# Patient Record
Sex: Female | Born: 1940
Health system: Southern US, Community
[De-identification: ages and names within clinical notes are randomized; demographics above are authoritative.]

## PROBLEM LIST (undated history)

## (undated) DIAGNOSIS — K635 Polyp of colon: Secondary | ICD-10-CM

## (undated) DIAGNOSIS — G5600 Carpal tunnel syndrome, unspecified upper limb: Secondary | ICD-10-CM

## (undated) DIAGNOSIS — H409 Unspecified glaucoma: Secondary | ICD-10-CM

## (undated) DIAGNOSIS — H269 Unspecified cataract: Secondary | ICD-10-CM

## (undated) DIAGNOSIS — S82892A Other fracture of left lower leg, initial encounter for closed fracture: Secondary | ICD-10-CM

## (undated) DIAGNOSIS — K219 Gastro-esophageal reflux disease without esophagitis: Secondary | ICD-10-CM

## (undated) DIAGNOSIS — E042 Nontoxic multinodular goiter: Secondary | ICD-10-CM

## (undated) DIAGNOSIS — K449 Diaphragmatic hernia without obstruction or gangrene: Secondary | ICD-10-CM

## (undated) DIAGNOSIS — F411 Generalized anxiety disorder: Secondary | ICD-10-CM

## (undated) DIAGNOSIS — Z86010 Personal history of colonic polyps: Secondary | ICD-10-CM

## (undated) DIAGNOSIS — T7840XA Allergy, unspecified, initial encounter: Secondary | ICD-10-CM

## (undated) DIAGNOSIS — J309 Allergic rhinitis, unspecified: Secondary | ICD-10-CM

## (undated) DIAGNOSIS — M810 Age-related osteoporosis without current pathological fracture: Secondary | ICD-10-CM

## (undated) DIAGNOSIS — M1990 Unspecified osteoarthritis, unspecified site: Secondary | ICD-10-CM

## (undated) DIAGNOSIS — M21859 Other specified acquired deformities of unspecified thigh: Secondary | ICD-10-CM

## (undated) DIAGNOSIS — Z87891 Personal history of nicotine dependence: Secondary | ICD-10-CM

## (undated) DIAGNOSIS — D509 Iron deficiency anemia, unspecified: Secondary | ICD-10-CM

## (undated) HISTORY — DX: Unspecified cataract: H26.9

## (undated) HISTORY — DX: Personal history of colonic polyps: Z86.010

## (undated) HISTORY — DX: Other specified acquired deformities of unspecified thigh: M21.859

## (undated) HISTORY — PX: CARPAL TUNNEL RELEASE: SHX101

## (undated) HISTORY — DX: Carpal tunnel syndrome, unspecified upper limb: G56.00

## (undated) HISTORY — DX: Unspecified glaucoma: H40.9

## (undated) HISTORY — DX: Polyp of colon: K63.5

## (undated) HISTORY — DX: Unspecified osteoarthritis, unspecified site: M19.90

## (undated) HISTORY — DX: Diaphragmatic hernia without obstruction or gangrene: K44.9

## (undated) HISTORY — DX: Personal history of nicotine dependence: Z87.891

## (undated) HISTORY — DX: Nontoxic multinodular goiter: E04.2

## (undated) HISTORY — DX: Generalized anxiety disorder: F41.1

## (undated) HISTORY — DX: Iron deficiency anemia, unspecified: D50.9

## (undated) HISTORY — DX: Age-related osteoporosis without current pathological fracture: M81.0

## (undated) HISTORY — DX: Allergic rhinitis, unspecified: J30.9

## (undated) HISTORY — PX: EYE SURGERY: SHX253

## (undated) HISTORY — DX: Allergy, unspecified, initial encounter: T78.40XA

## (undated) HISTORY — DX: Gastro-esophageal reflux disease without esophagitis: K21.9

---
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
## 1997-12-02 ENCOUNTER — Other Ambulatory Visit: Admission: RE | Admit: 1997-12-02 | Discharge: 1997-12-02 | Payer: Self-pay | Admitting: Gynecology

## 1998-11-09 ENCOUNTER — Other Ambulatory Visit: Admission: RE | Admit: 1998-11-09 | Discharge: 1998-11-09 | Payer: Self-pay | Admitting: Obstetrics & Gynecology

## 1998-12-14 ENCOUNTER — Other Ambulatory Visit: Admission: RE | Admit: 1998-12-14 | Discharge: 1998-12-14 | Payer: Self-pay | Admitting: Gynecology

## 1998-12-26 ENCOUNTER — Other Ambulatory Visit: Admission: RE | Admit: 1998-12-26 | Discharge: 1998-12-26 | Payer: Self-pay | Admitting: Gynecology

## 1999-11-20 ENCOUNTER — Other Ambulatory Visit: Admission: RE | Admit: 1999-11-20 | Discharge: 1999-11-20 | Payer: Self-pay | Admitting: Gynecology

## 2000-01-09 ENCOUNTER — Encounter (INDEPENDENT_AMBULATORY_CARE_PROVIDER_SITE_OTHER): Payer: Self-pay | Admitting: *Deleted

## 2000-01-09 ENCOUNTER — Ambulatory Visit (HOSPITAL_COMMUNITY): Admission: RE | Admit: 2000-01-09 | Discharge: 2000-01-09 | Payer: Self-pay | Admitting: Gastroenterology

## 2000-01-25 ENCOUNTER — Encounter: Payer: Self-pay | Admitting: Gastroenterology

## 2000-01-25 ENCOUNTER — Ambulatory Visit (HOSPITAL_COMMUNITY): Admission: RE | Admit: 2000-01-25 | Discharge: 2000-01-25 | Payer: Self-pay | Admitting: Gastroenterology

## 2000-11-28 ENCOUNTER — Other Ambulatory Visit: Admission: RE | Admit: 2000-11-28 | Discharge: 2000-11-28 | Payer: Self-pay | Admitting: Gynecology

## 2001-11-30 ENCOUNTER — Other Ambulatory Visit: Admission: RE | Admit: 2001-11-30 | Discharge: 2001-11-30 | Payer: Self-pay | Admitting: Gynecology

## 2002-04-15 ENCOUNTER — Encounter: Payer: Self-pay | Admitting: Internal Medicine

## 2002-04-15 ENCOUNTER — Encounter: Admission: RE | Admit: 2002-04-15 | Discharge: 2002-04-15 | Payer: Self-pay | Admitting: Internal Medicine

## 2002-12-01 ENCOUNTER — Other Ambulatory Visit: Admission: RE | Admit: 2002-12-01 | Discharge: 2002-12-01 | Payer: Self-pay | Admitting: Gynecology

## 2002-12-08 ENCOUNTER — Encounter: Payer: Self-pay | Admitting: Internal Medicine

## 2002-12-08 ENCOUNTER — Encounter: Admission: RE | Admit: 2002-12-08 | Discharge: 2002-12-08 | Payer: Self-pay | Admitting: Internal Medicine

## 2002-12-16 ENCOUNTER — Encounter: Payer: Self-pay | Admitting: Internal Medicine

## 2002-12-16 ENCOUNTER — Ambulatory Visit (HOSPITAL_COMMUNITY): Admission: RE | Admit: 2002-12-16 | Discharge: 2002-12-16 | Payer: Self-pay | Admitting: Internal Medicine

## 2003-01-11 ENCOUNTER — Encounter: Payer: Self-pay | Admitting: Endocrinology

## 2003-01-11 ENCOUNTER — Encounter (INDEPENDENT_AMBULATORY_CARE_PROVIDER_SITE_OTHER): Payer: Self-pay

## 2003-01-11 ENCOUNTER — Ambulatory Visit (HOSPITAL_COMMUNITY): Admission: RE | Admit: 2003-01-11 | Discharge: 2003-01-11 | Payer: Self-pay | Admitting: Endocrinology

## 2003-08-13 HISTORY — PX: COLONOSCOPY: SHX174

## 2003-12-16 ENCOUNTER — Ambulatory Visit (HOSPITAL_COMMUNITY): Admission: RE | Admit: 2003-12-16 | Discharge: 2003-12-16 | Payer: Self-pay | Admitting: Endocrinology

## 2004-04-30 ENCOUNTER — Encounter: Payer: Self-pay | Admitting: Gastroenterology

## 2004-04-30 LAB — HM COLONOSCOPY

## 2004-08-10 ENCOUNTER — Encounter: Admission: RE | Admit: 2004-08-10 | Discharge: 2004-08-10 | Payer: Self-pay | Admitting: Plastic Surgery

## 2004-08-15 ENCOUNTER — Encounter: Admission: RE | Admit: 2004-08-15 | Discharge: 2004-08-15 | Payer: Self-pay | Admitting: Plastic Surgery

## 2004-08-16 ENCOUNTER — Ambulatory Visit (HOSPITAL_COMMUNITY): Admission: RE | Admit: 2004-08-16 | Discharge: 2004-08-16 | Payer: Self-pay | Admitting: Plastic Surgery

## 2004-12-25 ENCOUNTER — Ambulatory Visit: Payer: Self-pay | Admitting: Endocrinology

## 2005-01-01 ENCOUNTER — Ambulatory Visit (HOSPITAL_COMMUNITY): Admission: RE | Admit: 2005-01-01 | Discharge: 2005-01-01 | Payer: Self-pay | Admitting: Endocrinology

## 2005-08-12 DIAGNOSIS — Z860100 Personal history of colon polyps, unspecified: Secondary | ICD-10-CM

## 2005-08-12 HISTORY — DX: Personal history of colon polyps, unspecified: Z86.0100

## 2005-08-12 HISTORY — DX: Personal history of colonic polyps: Z86.010

## 2005-12-04 ENCOUNTER — Other Ambulatory Visit: Admission: RE | Admit: 2005-12-04 | Discharge: 2005-12-04 | Payer: Self-pay | Admitting: Gynecology

## 2005-12-17 ENCOUNTER — Ambulatory Visit: Payer: Self-pay | Admitting: Gastroenterology

## 2005-12-19 ENCOUNTER — Ambulatory Visit: Payer: Self-pay | Admitting: Gastroenterology

## 2005-12-23 ENCOUNTER — Ambulatory Visit: Payer: Self-pay | Admitting: Gastroenterology

## 2006-03-27 ENCOUNTER — Ambulatory Visit: Payer: Self-pay | Admitting: Gastroenterology

## 2006-04-03 ENCOUNTER — Ambulatory Visit: Payer: Self-pay | Admitting: Endocrinology

## 2006-07-09 ENCOUNTER — Ambulatory Visit: Payer: Self-pay | Admitting: Gastroenterology

## 2007-01-06 ENCOUNTER — Encounter: Payer: Self-pay | Admitting: Internal Medicine

## 2007-05-21 ENCOUNTER — Encounter: Payer: Self-pay | Admitting: *Deleted

## 2007-05-21 DIAGNOSIS — M1990 Unspecified osteoarthritis, unspecified site: Secondary | ICD-10-CM

## 2007-05-21 DIAGNOSIS — K219 Gastro-esophageal reflux disease without esophagitis: Secondary | ICD-10-CM

## 2007-05-21 DIAGNOSIS — E042 Nontoxic multinodular goiter: Secondary | ICD-10-CM

## 2007-05-21 DIAGNOSIS — G5600 Carpal tunnel syndrome, unspecified upper limb: Secondary | ICD-10-CM

## 2007-05-21 DIAGNOSIS — Z860100 Personal history of colon polyps, unspecified: Secondary | ICD-10-CM | POA: Insufficient documentation

## 2007-05-21 DIAGNOSIS — D509 Iron deficiency anemia, unspecified: Secondary | ICD-10-CM | POA: Insufficient documentation

## 2007-06-26 ENCOUNTER — Encounter: Payer: Self-pay | Admitting: Internal Medicine

## 2007-06-26 LAB — CONVERTED CEMR LAB
Hemoglobin: 11.6 g/dL
Platelets: 165 10*3/uL
RBC: 3.61 M/uL

## 2007-09-29 ENCOUNTER — Encounter
Admission: RE | Admit: 2007-09-29 | Discharge: 2007-09-29 | Payer: Self-pay | Admitting: Physical Medicine and Rehabilitation

## 2007-10-21 ENCOUNTER — Encounter: Payer: Self-pay | Admitting: Internal Medicine

## 2007-12-16 ENCOUNTER — Other Ambulatory Visit: Admission: RE | Admit: 2007-12-16 | Discharge: 2007-12-16 | Payer: Self-pay | Admitting: Family Medicine

## 2007-12-16 ENCOUNTER — Encounter: Payer: Self-pay | Admitting: Internal Medicine

## 2007-12-16 LAB — CONVERTED CEMR LAB

## 2008-04-11 ENCOUNTER — Encounter: Payer: Self-pay | Admitting: Internal Medicine

## 2008-04-20 ENCOUNTER — Encounter: Payer: Self-pay | Admitting: Internal Medicine

## 2008-04-20 LAB — CONVERTED CEMR LAB
Lymphocytes, automated: 42.8 %
Platelets: 171 10*3/uL
RBC: 4.22 M/uL
WBC: 5.1 10*3/uL

## 2008-05-09 ENCOUNTER — Encounter: Payer: Self-pay | Admitting: Internal Medicine

## 2008-06-29 ENCOUNTER — Encounter: Payer: Self-pay | Admitting: Internal Medicine

## 2008-06-29 LAB — CONVERTED CEMR LAB
Basophils Relative: 1 %
Hemoglobin: 12.9 g/dL
MCV: 94 fL
RBC: 4.06 M/uL
WBC: 5.5 10*3/uL

## 2008-07-13 DIAGNOSIS — K449 Diaphragmatic hernia without obstruction or gangrene: Secondary | ICD-10-CM | POA: Insufficient documentation

## 2008-07-13 DIAGNOSIS — K222 Esophageal obstruction: Secondary | ICD-10-CM | POA: Insufficient documentation

## 2008-07-14 ENCOUNTER — Ambulatory Visit: Payer: Self-pay | Admitting: Gastroenterology

## 2008-07-14 LAB — CONVERTED CEMR LAB
ALT: 19 units/L (ref 0–35)
AST: 25 units/L (ref 0–37)
Alkaline Phosphatase: 52 units/L (ref 39–117)
Bilirubin, Direct: 0.1 mg/dL (ref 0.0–0.3)
Tissue Transglutaminase Ab, IgA: 0.1 units (ref <7)
Total Bilirubin: 0.9 mg/dL (ref 0.3–1.2)
Total Protein: 7 g/dL (ref 6.0–8.3)

## 2008-08-23 ENCOUNTER — Ambulatory Visit (HOSPITAL_COMMUNITY): Admission: RE | Admit: 2008-08-23 | Discharge: 2008-08-23 | Payer: Self-pay | Admitting: Gastroenterology

## 2008-09-04 ENCOUNTER — Encounter: Admission: RE | Admit: 2008-09-04 | Discharge: 2008-09-04 | Payer: Self-pay | Admitting: Specialist

## 2008-10-10 HISTORY — PX: OTHER SURGICAL HISTORY: SHX169

## 2008-12-27 ENCOUNTER — Encounter: Payer: Self-pay | Admitting: Internal Medicine

## 2008-12-27 LAB — CONVERTED CEMR LAB
HCT: 37 %
Hemoglobin: 12.4 g/dL
Platelets: 187 10*3/uL
RBC: 4.2 M/uL
RDW: 13.2 %

## 2009-06-09 ENCOUNTER — Encounter: Payer: Self-pay | Admitting: Internal Medicine

## 2009-06-09 LAB — CONVERTED CEMR LAB
BUN: 13 mg/dL
Chloride: 105 meq/L
Glucose, Bld: 86 mg/dL
HCT: 37.3 %
Hemoglobin: 12.7 g/dL
Lymphocytes, automated: 44.5 %
Neutrophils Relative %: 42.5 %
Platelets: 174 10*3/uL
Potassium: 4.9 meq/L
RDW: 14.9 %
Sodium: 141 meq/L
Total Protein: 6.5 g/dL

## 2009-10-16 ENCOUNTER — Ambulatory Visit (HOSPITAL_COMMUNITY)
Admission: RE | Admit: 2009-10-16 | Discharge: 2009-10-16 | Payer: Self-pay | Admitting: Physical Medicine and Rehabilitation

## 2009-10-20 ENCOUNTER — Encounter: Payer: Self-pay | Admitting: Internal Medicine

## 2009-10-20 LAB — CONVERTED CEMR LAB: TSH: 1.42 microintl units/mL

## 2009-11-28 ENCOUNTER — Ambulatory Visit: Payer: Self-pay | Admitting: Internal Medicine

## 2009-11-28 DIAGNOSIS — R5383 Other fatigue: Secondary | ICD-10-CM

## 2009-11-28 DIAGNOSIS — Z9189 Other specified personal risk factors, not elsewhere classified: Secondary | ICD-10-CM | POA: Insufficient documentation

## 2009-11-28 DIAGNOSIS — M129 Arthropathy, unspecified: Secondary | ICD-10-CM | POA: Insufficient documentation

## 2009-11-28 DIAGNOSIS — R5381 Other malaise: Secondary | ICD-10-CM | POA: Insufficient documentation

## 2009-11-28 DIAGNOSIS — Z87891 Personal history of nicotine dependence: Secondary | ICD-10-CM

## 2009-11-28 DIAGNOSIS — J309 Allergic rhinitis, unspecified: Secondary | ICD-10-CM | POA: Insufficient documentation

## 2009-11-29 LAB — CONVERTED CEMR LAB
Eosinophils Absolute: 0.1 10*3/uL (ref 0.0–0.7)
Eosinophils Relative: 1.7 % (ref 0.0–5.0)
Lymphocytes Relative: 43.8 % (ref 12.0–46.0)
Lymphs Abs: 3.2 10*3/uL (ref 0.7–4.0)
Monocytes Relative: 5.6 % (ref 3.0–12.0)
Platelets: 231 10*3/uL (ref 150.0–400.0)
RDW: 14.9 % — ABNORMAL HIGH (ref 11.5–14.6)
Vitamin B-12: 610 pg/mL (ref 211–911)
WBC: 7.3 10*3/uL (ref 4.5–10.5)

## 2009-12-07 ENCOUNTER — Encounter: Payer: Self-pay | Admitting: Internal Medicine

## 2009-12-11 ENCOUNTER — Telehealth (INDEPENDENT_AMBULATORY_CARE_PROVIDER_SITE_OTHER): Payer: Self-pay | Admitting: *Deleted

## 2009-12-14 ENCOUNTER — Encounter: Payer: Self-pay | Admitting: Internal Medicine

## 2009-12-14 DIAGNOSIS — H409 Unspecified glaucoma: Secondary | ICD-10-CM | POA: Insufficient documentation

## 2010-01-11 LAB — HM MAMMOGRAPHY

## 2010-02-27 ENCOUNTER — Ambulatory Visit: Payer: Self-pay | Admitting: Gastroenterology

## 2010-02-27 DIAGNOSIS — R160 Hepatomegaly, not elsewhere classified: Secondary | ICD-10-CM | POA: Insufficient documentation

## 2010-02-27 LAB — CONVERTED CEMR LAB
ALT: 16 units/L (ref 0–35)
AST: 23 units/L (ref 0–37)
Albumin: 4.4 g/dL (ref 3.5–5.2)
BUN: 17 mg/dL (ref 6–23)
Basophils Relative: 0.7 % (ref 0.0–3.0)
CO2: 32 meq/L (ref 19–32)
Calcium: 9.8 mg/dL (ref 8.4–10.5)
Eosinophils Absolute: 0.2 10*3/uL (ref 0.0–0.7)
Ferritin: 5.5 ng/mL — ABNORMAL LOW (ref 10.0–291.0)
HCT: 38.3 % (ref 36.0–46.0)
Hemoglobin: 12.9 g/dL (ref 12.0–15.0)
Iron: 95 ug/dL (ref 42–145)
MCHC: 33.8 g/dL (ref 30.0–36.0)
Magnesium: 2.2 mg/dL (ref 1.5–2.5)
Monocytes Absolute: 0.4 10*3/uL (ref 0.1–1.0)
Monocytes Relative: 7.4 % (ref 3.0–12.0)
Neutro Abs: 2.1 10*3/uL (ref 1.4–7.7)
Platelets: 207 10*3/uL (ref 150.0–400.0)
Potassium: 4.8 meq/L (ref 3.5–5.1)
RBC: 4.3 M/uL (ref 3.87–5.11)
RDW: 17.7 % — ABNORMAL HIGH (ref 11.5–14.6)
Saturation Ratios: 22.3 % (ref 20.0–50.0)
Sed Rate: 14 mm/hr (ref 0–22)
Sodium: 141 meq/L (ref 135–145)
TSH: 1.17 microintl units/mL (ref 0.35–5.50)
Tissue Transglutaminase Ab, IgA: 2.6 units (ref <20)
Vitamin B-12: 698 pg/mL (ref 211–911)
WBC: 5.4 10*3/uL (ref 4.5–10.5)

## 2010-03-05 ENCOUNTER — Ambulatory Visit: Payer: Self-pay | Admitting: Cardiology

## 2010-05-29 ENCOUNTER — Ambulatory Visit: Payer: Self-pay | Admitting: Internal Medicine

## 2010-05-29 DIAGNOSIS — F320 Major depressive disorder, single episode, mild: Secondary | ICD-10-CM | POA: Insufficient documentation

## 2010-05-30 LAB — CONVERTED CEMR LAB
Cholesterol: 188 mg/dL (ref 0–200)
Ferritin: 5 ng/mL — ABNORMAL LOW (ref 10.0–291.0)
HDL: 74.4 mg/dL (ref >39.00)
Saturation Ratios: 11.5 % — ABNORMAL LOW (ref 20.0–50.0)
Total CHOL/HDL Ratio: 3
Triglycerides: 68 mg/dL (ref 0.0–149.0)

## 2010-07-13 ENCOUNTER — Ambulatory Visit: Payer: Self-pay | Admitting: Internal Medicine

## 2010-08-07 ENCOUNTER — Ambulatory Visit
Admission: RE | Admit: 2010-08-07 | Discharge: 2010-08-07 | Payer: Self-pay | Source: Home / Self Care | Attending: Internal Medicine | Admitting: Internal Medicine

## 2010-08-07 DIAGNOSIS — R490 Dysphonia: Secondary | ICD-10-CM

## 2010-08-08 DIAGNOSIS — R05 Cough: Secondary | ICD-10-CM

## 2010-08-23 ENCOUNTER — Encounter: Payer: Self-pay | Admitting: Internal Medicine

## 2010-09-11 NOTE — Letter (Signed)
Summary: [facility]   Imported By: Sherian Rein 12/19/2009 13:52:17  _____________________________________________________________________  External Attachment:    Type:   Image     Comment:   External Document

## 2010-09-11 NOTE — Letter (Signed)
Summary: Gweneth Dimitri MD  Gweneth Dimitri MD   Imported By: Sherian Rein 12/19/2009 13:47:56  _____________________________________________________________________  External Attachment:    Type:   Image     Comment:   External Document

## 2010-09-11 NOTE — Letter (Signed)
Summary: [facility]  [facility]   Imported By: Sherian Rein 12/19/2009 13:51:15  _____________________________________________________________________  External Attachment:    Type:   Image     Comment:   External Document

## 2010-09-11 NOTE — Assessment & Plan Note (Signed)
Summary: NEW/ MEDICARE/ PER DR WALL AND DR Elsbeth Yearick/[employer]   Vital Signs:  Patient profile:   70 year old female Height:      64 inches (162.56 cm) Weight:      142.8 pounds (64.91 kg) BMI:     24.60 O2 Sat:      97 % on Room air Temp:     97.9 degrees F (36.61 degrees C) oral Pulse rate:   79 / minute BP sitting:   130 / 60  (left arm) Cuff size:   regular  Vitals Entered By: Orlan Leavens (November 28, 2009 2:17 PM)  O2 Flow:  Room air CC: New patient Is Patient Diabetic? No Pain Assessment Patient in pain? no        Primary Care Provider:  Newt Lukes MD  CC:  New patient.  History of Present Illness: here to est care, new to our division prev followed with dr. Uvaldo Rising at [facility]  1) c/o fatigue - progressive symptoms  onset > 2 mo ago - hx iron defic - due for colo reck upcoming - +FH B12 shots - ?if these needed no sleep change - no energy but contiues to exercise 3-4d/week denies depression symptoms  recent TSH checked and normal by report (outside lab not available to review at this time)  2) allergic rhinitis - uses nasal steroid only as needed - also OTC meds as needed, daily during current season due to inc symptoms in spring with pollen  3) GERD - follows with GI for same - symptoms well controlled if on daily PPI- no stomach pains since stopping Aleve -  4) arthritis - hips, knees - controlled with tylenol as needed - no swelling, no falls - sees ortho as needed      Preventive Screening-Counseling & Management  Alcohol-Tobacco     Alcohol drinks/day: <1     Alcohol Counseling: not indicated; use of alcohol is not excessive or problematic     Smoking Status: quit > 6 months     Year Quit: 1990     Tobacco Counseling: to remain off tobacco products  Caffeine-Diet-Exercise     Diet Counseling: not indicated; diet is assessed to be healthy     Does Patient Exercise: yes     Times/week: 4     Exercise Counseling: not indicated; exercise is  adequate     Depression Counseling: not indicated; screening negative for depression  Safety-Violence-Falls     Seat Belt Use: yes     Helmet Use: n/a     Firearms in the Home: firearms in the home     Firearm Counseling: not applicable - not 'usable, only collection'     Smoke Detectors: yes     Violence in the Home: no risk noted     Fall Risk Counseling: not indicated; no significant falls noted  Clinical Review Panels:  Prevention   Last Colonoscopy:  Location:  [facility].  (04/30/2004)  Immunizations   Last Tetanus Booster:  Td (01/13/2004)   Last Flu Vaccine:  Historical (05/12/2009)   Last Zoster Vaccine:  Zostavax (05/12/2009)  Complete Metabolic Panel   Albumin:  4.0 (07/14/2008)   Total Protein:  7.0 (07/14/2008)   Total Bili:  0.9 (07/14/2008)   Alk Phos:  52 (07/14/2008)   SGPT (ALT):  19 (07/14/2008)   SGOT (AST):  25 (07/14/2008)   Current Medications (verified): 1)  Omeprazole 20 Mg  Cpdr (Omeprazole) .... Take 1 By Mouth Qd 2)  Allegra 180 Mg  Tabs (Fexofenadine Hcl) .... Take 1 By Mouth Qd 3)  Xalatan 0.005 % Soln (Latanoprost) .... One Drop Both Eyes Once Daily 4)  Gabapentin 100 Mg Caps (Gabapentin) .... 3 Tablets By Mouth At Bedtime 5)  Vitamin B Complex-C   Caps (B Complex-C) .... One Tablet By Mouth Once Daily 6)  Vitamin C 500 Mg  Tabs (Ascorbic Acid) .... One Tablet By Mouth Once Daily 7)  Calcium Carbonate-Vitamin D 600-400 Mg-Unit  Tabs (Calcium Carbonate-Vitamin D) .... One Tablet By Mouth Once Daily 8)  Cosamin Ds 500-400 Mg Tabs (Glucosamine-Chondroitin) .... 2 Tablets By Mouth Once Daily 9)  Fish Oil 1000 Mg Caps (Omega-3 Fatty Acids) .... 2 Capsules By Mouth Once Daily 10)  Tylenol Arthritis Pain 650 Mg Cr-Tabs (Acetaminophen) .... As Needed 11)  Flonase 50 Mcg/act Susp (Fluticasone Propionate) .... Use 1 Spray Each Nostril As Needed 12)  Vitamin D 1000 Unit Tabs (Cholecalciferol) .... Take 1 By Mouth Qd  Allergies  (verified): No Known Drug Allergies  Past History:  Past Medical History: Allergic rhinitis GERD glaucoma Osteoarthritis Low back pain - DDD iron defic thyroid goiter, hx of  MD rooster: ortho - [place] [facility] - [facility] - [facility] - [facility] - [facility]  Past Surgical History: EGD (12/23/2005) surgery on both eyes (2956-2130) (R) shoulder surgery (10/2008)  Family History: Reviewed history from 07/13/2008 and no changes required. Family History of Diabetes: mother Family History of Heart Disease: mother N Family History of Colon CA 1st degree relative <60 (grandparent) Family History of Arthritis (parent) Family History Hypertension (mother)  Social History: Patient is a former smoker.  married, lives with spouse [employer] Alcohol Use - yes Illicit Drug Use - no Smoking Status:  quit > 6 months Does Patient Exercise:  yes Seat Belt Use:  yes  Review of Systems       see HPI above. I have reviewed all other systems and they were negative.   Physical Exam  General:  alert, well-developed, well-nourished, and cooperative to examination.    Eyes:  vision grossly intact; pupils equal, round and reactive to light.  conjunctiva and lids normal.   mild disconj gaze -wears corrective lenses Ears:  normal pinnae bilaterally, without erythema, swelling, or tenderness to palpation. TMs clear, without effusion, or cerumen impaction. Hearing grossly normal bilaterally  Mouth:  teeth and gums in good repair; mucous membranes moist, without lesions or ulcers. oropharynx clear without exudate, no erythema.  Lungs:  normal respiratory effort, no intercostal retractions or use of accessory muscles; normal breath sounds bilaterally - no crackles and no wheezes.    Heart:  normal rate, regular rhythm, no murmur, and no rub. BLE without edema. normal DP pulses and normal cap refill in all 4 extremities    Abdomen:  soft, non-tender, normal  bowel sounds, no distention; no masses and no appreciable hepatomegaly or splenomegaly.   Msk:  No deformity or scoliosis noted of thoracic or lumbar spine.   Neurologic:  alert & oriented X3 and cranial nerves II-XII symetrically intact.  strength normal in all extremities, sensation intact to light touch, and gait normal. speech fluent without dysarthria or aphasia; follows commands with good comprehension.  Skin:  no rashes, vesicles, ulcers, or erythema. No nodules or irregularity to palpation.  Psych:  Oriented X3, memory intact for recent and remote, normally interactive, good eye contact, not anxious appearing, not depressed appearing, and not agitated.      Impression &  Recommendations:  Problem # 1:  FATIGUE (ICD-780.79) nonsp hx and exam - check labs - will follow symptoms  Orders: TLB-CBC Platelet - w/Differential (85025-CBCD) TLB-B12 + Folate Pnl (16109_60454-U98/JXB) TLB-Iron, (Fe) Total (83540-FE)  Problem # 2:  ALLERGIC RHINITIS (ICD-477.9)  Her updated medication list for this problem includes:    Allegra 180 Mg Tabs (Fexofenadine hcl) .Marland Kitchen... Take 1 by mouth once daily as needed    Flonase 50 Mcg/act Susp (Fluticasone propionate) ..... Use 1 spray each nostril as needed  Discussed use of allergy medications and environmental measures.   Problem # 3:  ARTHRITIS (ICD-716.90) cont tylenol and mgmt by ortho as needed for flares  Problem # 4:  GERD (ICD-530.81)  Her updated medication list for this problem includes:    Omeprazole 20 Mg Cpdr (Omeprazole) .Marland Kitchen... Take 1 by mouth once daily  EGD: Location: [facility]   (12/23/2005)  Complete Medication List: 1)  Omeprazole 20 Mg Cpdr (Omeprazole) .... Take 1 by mouth once daily 2)  Allegra 180 Mg Tabs (Fexofenadine hcl) .... Take 1 by mouth once daily as needed 3)  Xalatan 0.005 % Soln (Latanoprost) .... One drop both eyes once daily 4)  Gabapentin 100 Mg Caps (Gabapentin) .... 3 tablets by mouth at  bedtime 5)  Vitamin B Complex-c Caps (B complex-c) .... One tablet by mouth once daily 6)  Vitamin C 500 Mg Tabs (Ascorbic acid) .... One tablet by mouth once daily 7)  Calcium Carbonate-vitamin D 600-400 Mg-unit Tabs (Calcium carbonate-vitamin d) .... One tablet by mouth once daily 8)  Cosamin Ds 500-400 Mg Tabs (Glucosamine-chondroitin) .... 2 tablets by mouth once daily 9)  Fish Oil 1000 Mg Caps (Omega-3 fatty acids) .... 2 capsules by mouth once daily 10)  Tylenol Arthritis Pain 650 Mg Cr-tabs (Acetaminophen) .... As needed 11)  Flonase 50 Mcg/act Susp (Fluticasone propionate) .... Use 1 spray each nostril as needed 12)  Vitamin D 1000 Unit Tabs (Cholecalciferol) .... Take 1 by mouth once daily  Patient Instructions: 1)  it was good to see you today.  2)  medications reviewed today - no changes recommended- 3)  will send for records from [place] to review - 4)  Schedule your mammogram when due - let us know if referral is needed 5)  test(s) ordered today - your results will be posted on the phone tree for review in 48-72 hours from the time of test completion; call [phone] and enter your 9 digit MRN (listed above on this page, just below your name); if any changes need to be made or there are abnormal results, you will be contacted directly.  6)  Please schedule a follow-up appointment in 6 months, sooner if problems.    Immunization History:  Influenza Immunization History:    Influenza:  historical (05/12/2009)  Zostavax History:    Zostavax # 1:  zostavax (05/12/2009)     Prevention & Chronic Care Immunizations   Influenza vaccine: Historical  (05/12/2009)    Tetanus booster: 01/13/2004: Td    Pneumococcal vaccine: Not documented    H. zoster vaccine: 05/12/2009: Zostavax  Colorectal Screening   Hemoccult: Not documented    Colonoscopy: Location:  [facility].    (04/30/2004)  Other Screening   Pap smear: Not documented    Mammogram: Not  documented    DXA bone density scan: Not documented   Smoking status: quit > 6 months  (11/28/2009)  Lipids   Total Cholesterol: Not documented   LDL: Not documented  LDL Direct: Not documented   HDL: Not documented   Triglycerides: Not documented

## 2010-09-11 NOTE — Letter (Signed)
Summary: [facility]  [facility]   Imported By: Sherian Rein 12/19/2009 13:50:11  _____________________________________________________________________  External Attachment:    Type:   Image     Comment:   External Document

## 2010-09-11 NOTE — Progress Notes (Signed)
  Phone Note Other Incoming   Action Taken: Software engineer of Call: Records received from [facility]. 87 pages sent to [facility] for Dr. Felicity Coyer to review. I left a voice message for Valentina Gu stating this information.

## 2010-09-11 NOTE — Assessment & Plan Note (Signed)
Summary: problems with stomach ...em   History of Present Illness Visit Type: Follow-up Visit Primary GI MD: Sheryn Bison MD FACP FAGA Primary Provider: Newt Lukes MD Chief Complaint: Generalized abdominal pain that comes and goes History of Present Illness:   Pleasant 70 year old Caucasian female with chronic gastroesophageal reflux disease previously on PPI therapy which she recently discontinued. She complains of a constant burning sensation in her epigastric area without radiation, without precipitating or alleviating elements, and without associated anorexia or weight loss. She has regular bowel movements and denies melena, hematochezia, systemic complaints such as fever chills, or any hepatobiliary problems. Previous endoscopy and ultrasounds have been unremarkable. She denies abuse of alcohol, cigarettes, or NSAIDs. She has recently been diagnosed with iron deficiency anemia and is on oral iron, calcium, and vitamin D. Because of her chronic neuropathy in her back she takes Gabapentin 300 mg at bedtime.   GI Review of Systems    Reports abdominal pain and  bloating.     Location of  Abdominal pain: generalized.    Denies acid reflux, belching, chest pain, dysphagia with liquids, dysphagia with solids, heartburn, loss of appetite, nausea, vomiting, vomiting blood, weight loss, and  weight gain.        Denies anal fissure, black tarry stools, change in bowel habit, constipation, diarrhea, diverticulosis, fecal incontinence, heme positive stool, hemorrhoids, irritable bowel syndrome, jaundice, light color stool, liver problems, rectal bleeding, and  rectal pain.    Current Medications (verified): 1)  Xalatan 0.005 % Soln (Latanoprost) .... One Drop Both Eyes Once Daily 2)  Gabapentin 100 Mg Caps (Gabapentin) .... 3 Tablets By Mouth At Bedtime 3)  Vitamin B Complex-C   Caps (B Complex-C) .... One Tablet By Mouth Once Daily 4)  Calcium Carbonate-Vitamin D 600-400 Mg-Unit  Tabs  (Calcium Carbonate-Vitamin D) .... One Tablet By Mouth Once Daily 5)  Cosamin Ds 500-400 Mg Tabs (Glucosamine-Chondroitin) .... 2 Tablets By Mouth Once Daily 6)  Fish Oil 1000 Mg Caps (Omega-3 Fatty Acids) .... 2 Capsules By Mouth Once Daily 7)  Tylenol Arthritis Pain 650 Mg Cr-Tabs (Acetaminophen) .... As Needed 8)  Vitamin D 1000 Unit Tabs (Cholecalciferol) .... Take 1 By Mouth Once Daily 9)  Nu-Iron 150 Mg Caps (Polysaccharide Iron Complex) .Marland Kitchen.. 1 By Mouth Once Daily As Needed  Allergies (verified): No Known Drug Allergies  Past History:  Family History: Last updated: 11/28/2009 Family History of Diabetes: mother Family History of Heart Disease: mother N Family History of Colon CA 1st degree relative <60 (grandparent) Family History of Arthritis (parent) Family History Hypertension (mother)  Social History: Last updated: 11/28/2009 Patient is a former smoker.  married, lives with spouse [employer] Alcohol Use - yes Illicit Drug Use - no  Past medical, surgical, family and social histories (including risk factors) reviewed for relevance to current acute and chronic problems.  Past Medical History: Reviewed history from 11/28/2009 and no changes required. Allergic rhinitis GERD glaucoma Osteoarthritis Low back pain - DDD iron defic thyroid goiter, hx of  MD rooster: ortho - [place] [facility] - [facility] - [facility] - [facility] - [facility] -[facility]  Past Surgical History: Reviewed history from 12/14/2009 and no changes required. EGD (12/23/2005) surgery on both eyes (2952-8413) (R) shoulder surgery (10/2008) Left breast cyst removal in 1960 without complications  Family History: Reviewed history from 11/28/2009 and no changes required. Family History of Diabetes: mother Family History of Heart Disease: mother N Family History of Colon CA 1st degree relative <60 (  grandparent) Family History of Arthritis (parent) Family History  Hypertension (mother)  Social History: Reviewed history from 11/28/2009 and no changes required. Patient is a former smoker.  married, lives with spouse [employer] Alcohol Use - yes Illicit Drug Use - no  Review of Systems       The patient complains of anemia and fatigue.  The patient denies allergy/sinus, anxiety-new, arthritis/joint pain, back pain, blood in urine, breast changes/lumps, change in vision, confusion, cough, coughing up blood, depression-new, fainting, fever, headaches-new, hearing problems, heart murmur, heart rhythm changes, itching, menstrual pain, muscle pains/cramps, night sweats, nosebleeds, pregnancy symptoms, shortness of breath, skin rash, sleeping problems, sore throat, swelling of feet/legs, swollen lymph glands, thirst - excessive , urination - excessive , urination changes/pain, urine leakage, vision changes, and voice change.    Vital Signs:  Patient profile:   70 year old female Height:      64 inches Weight:      140 pounds BMI:     24.12 BSA:     1.68 Pulse rate:   76 / minute Pulse rhythm:   regular BP sitting:   120 / 78  (left arm)  Vitals Entered By: Merri Ray CMA Duncan Dull) (February 27, 2010 10:51 AM)  Physical Exam  General:  Well developed, well nourished, no acute distress.healthy appearing.   Head:  Normocephalic and atraumatic. Eyes:  PERRLA, no icterus.exam deferred to patient's ophthalmologist.   Lungs:  Clear throughout to auscultation. Heart:  Regular rate and rhythm; no murmurs, rubs,  or bruits. Abdomen:  Soft, nontender and nondistended. No masses, hepatosplenomegaly or hernias noted. Normal bowel sounds.Enlarged right hepatic lobe with a somewhat firm edge noted on deep inspiration. Very active high-pitched bowel sounds noted. Msk:  Symmetrical with no gross deformities. Normal posture. Extremities:  No clubbing, cyanosis, edema or deformities noted. Neurologic:  Alert and  oriented x4;  grossly normal neurologically. Psych:   Alert and cooperative. Normal mood and affect.   Impression & Recommendations:  Problem # 1:  ABDOMINAL PAIN RIGHT UPPER QUADRANT (ICD-789.01) Assessment Deteriorated Unusual abdominal pain with mild hepatomegaly noted. I have ordered CT scan of the abdomen and pelvis along with screening labs. Colonoscopy and endoscopy were done 4 years ago and may need to be repeated. As mentioned above, she has been on PPI therapy without improvement. There is no history of NSAID or alcohol abuse. Other considerations would be bacterial overgrowth syndrome of unexplained etiology with associated gas, bloating, and IBS-type problems. Orders: TLB-CBC Platelet - w/Differential (85025-CBCD) TLB-BMP (Basic Metabolic Panel-BMET) (80048-METABOL) TLB-Hepatic/Liver Function Pnl (80076-HEPATIC) TLB-TSH (Thyroid Stimulating Hormone) (84443-TSH) TLB-B12, Serum-Total ONLY (16109-U04) TLB-Ferritin (82728-FER) TLB-Folic Acid (Folate) (82746-FOL) TLB-IBC Pnl (Iron/FE;Transferrin) (83550-IBC) TLB-CRP-High Sensitivity (C-Reactive Protein) (86140-FCRP) TLB-Amylase (82150-AMYL) TLB-Lipase (83690-LIPASE) TLB-Magnesium (Mg) (83735-MG) TLB-Sedimentation Rate (ESR) (85652-ESR) TLB-IgA (Immunoglobulin A) (82784-IGA) T-Sprue Panel (Celiac Disease Aby Eval) (83516x3/[address]) T-Trypsinogen Serum (54098-11914) CT Abdomen/Pelvis with Contrast (CT Abd/Pelvis w/con)  Problem # 2:  GERD (ICD-530.81) Assessment: Improved  Problem # 3:  FATIGUE (ICD-780.79) Assessment: Unchanged Continue oral iron replacement and repeat labs today.  Patient Instructions: 1)  Please go to the basement for lab work. 2)  Begin [facility] two times a day . 3)  You are scheduled for a CT scan. 4)  The medication list was reviewed and reconciled.  All changed / newly prescribed medications were explained.  A complete medication list was provided to the patient / caregiver. 5)  Copy sent to : Dr. Rene Paci

## 2010-09-11 NOTE — Assessment & Plan Note (Signed)
Summary: 4-6 WK FU  STC   Vital Signs:  Patient profile:   70 year old female Height:      64 inches (162.56 cm) Weight:      140.4 pounds (63.82 kg) O2 Sat:      95 % on Room air Temp:     98.2 degrees F (36.78 degrees C) oral Pulse rate:   74 / minute BP sitting:   124 / 68  (left arm) Cuff size:   regular  Vitals Entered By: Orlan Leavens RMA (July 13, 2010 2:40 PM)  O2 Flow:  Room air CC: 6 week follow-up Is Patient Diabetic? No Pain Assessment Patient in pain? no        Primary Care Audrina Marten:  Newt Lukes MD  CC:  6 week follow-up.  History of Present Illness: here for f/u  1) anxiety symptoms  progressive symptoms a/w fatigue onset summer 2011 no sleep change - no energy but contiues to exercise 3-4d/week denies depression symptoms but increase in anxiety and "nervous stomach" recent TSH normal on setraline since 05/29/10 - doing well  2) allergic rhinitis - uses nasal steroid only as needed - also OTC meds as needed, daily during current season due to inc symptoms in spring with pollen  3) GERD - follows with GI for same - symptoms well controlled if on daily PPI- no stomach pains since stopping Aleve - improved also on probiotic  4) arthritis - hips, knees - trigger fingers and morton's neuroma controlled with tylenol as needed - no swelling, no falls - sees ortho as needed      Current Medications (verified): 1)  Xalatan 0.005 % Soln (Latanoprost) .... One Drop Both Eyes Once Daily 2)  Gabapentin 100 Mg Caps (Gabapentin) .... 3 Tablets By Mouth At Bedtime 3)  Vitamin B Complex-C   Caps (B Complex-C) .... One Tablet By Mouth Once Daily 4)  Calcium Carbonate-Vitamin D 600-400 Mg-Unit  Tabs (Calcium Carbonate-Vitamin D) .... One Tablet By Mouth Once Daily 5)  Cosamin Ds 500-400 Mg Tabs (Glucosamine-Chondroitin) .... 2 Tablets By Mouth Once Daily 6)  Fish Oil 1000 Mg Caps (Omega-3 Fatty Acids) .... 2 Capsules By Mouth Once Daily 7)   Tylenol Arthritis Pain 650 Mg Cr-Tabs (Acetaminophen) .... As Needed 8)  Vitamin D 1000 Unit Tabs (Cholecalciferol) .... Take 1 By Mouth Once Daily 9)  Nu-Iron 150 Mg Caps (Polysaccharide Iron Complex) .Marland Kitchen.. 1 By Mouth Once Daily As Needed 10)  Vear Clock Colon Health  Caps (Probiotic Product) .... Two Times A Day 11)  Allegra Allergy 180 Mg Tabs (Fexofenadine Hcl) .... Take 1 By Mouth Once Daily 12)  Sertraline Hcl 25 Mg Tabs (Sertraline Hcl) .Marland Kitchen.. 1 By Mouth Once Daily  Allergies (verified): No Known Drug Allergies  Past History:  Past Medical History: Allergic rhinitis GERD glaucoma  Osteoarthritis Low back pain - DDD iron defic thyroid goiter, hx of  MD roster:  [facility]o - [facility] - [facility] - [facility] - [facility] - [facility]  Review of Systems       The patient complains of prolonged cough.  The patient denies fever, weight loss, syncope, and headaches.         also c/o nausea, not vomitting or pain  Physical Exam  General:  alert, well-developed, well-nourished, and cooperative to examination.    Ears:  normal pinnae bilaterally, without erythema, swelling, or tenderness to palpation. TMs clear, without effusion, or cerumen impaction. Hearing grossly normal bilaterally  Mouth:  teeth and gums in good repair; mucous membranes moist, without lesions or ulcers. oropharynx clear without exudate, no erythema.  Lungs:  normal respiratory effort, no intercostal retractions or use of accessory muscles; normal breath sounds bilaterally - no crackles and no wheezes.    Heart:  normal rate, regular rhythm, no murmur, and no rub. BLE without edema.  Psych:  Oriented X3, memory intact for recent and remote, normally interactive, good eye contact, not anxious appearing, not depressed appearing, and not agitated.      Impression & Recommendations:  Problem # 1:  ANXIETY STATE, UNSPECIFIED (ICD-300.00)  Her updated medication list for this  problem includes:    Sertraline Hcl 25 Mg Tabs (Sertraline hcl) .Marland Kitchen... 1 by mouth once daily  exac by alcoholic spouse - started low dose zoloft for same 05/2010 -doing well - cont same  Problem # 2:  GERD (ICD-530.81)  resume PPI and tessalon or persisting dry cough after URI - erx done Her updated medication list for this problem includes:    Omeprazole 20 Mg Cpdr (Omeprazole) .Marland Kitchen... 1 by mouth once daily x 7 days, then as needed for indigestion  Orders: Prescription Created Electronically [phone])  Complete Medication List: 1)  Xalatan 0.005 % Soln (Latanoprost) .... One drop both eyes once daily 2)  Gabapentin 100 Mg Caps (Gabapentin) .... 3 tablets by mouth at bedtime 3)  Vitamin B Complex-c Caps (B complex-c) .... One tablet by mouth once daily 4)  Calcium Carbonate-vitamin D 600-400 Mg-unit Tabs (Calcium carbonate-vitamin d) .... One tablet by mouth once daily 5)  Cosamin Ds 500-400 Mg Tabs (Glucosamine-chondroitin) .... 2 tablets by mouth once daily 6)  Fish Oil 1000 Mg Caps (Omega-3 fatty acids) .... 2 capsules by mouth once daily 7)  Tylenol Arthritis Pain 650 Mg Cr-tabs (Acetaminophen) .... As needed 8)  Vitamin D 1000 Unit Tabs (Cholecalciferol) .... Take 1 by mouth once daily 9)  Nu-iron 150 Mg Caps (Polysaccharide iron complex) .Marland Kitchen.. 1 by mouth once daily as needed 10)  Phillips Colon Health Caps (Probiotic product) .... Two times a day 11)  Allegra Allergy 180 Mg Tabs (Fexofenadine hcl) .... Take 1 by mouth once daily 12)  Sertraline Hcl 25 Mg Tabs (Sertraline hcl) .Marland Kitchen.. 1 by mouth once daily 13)  Omeprazole 20 Mg Cpdr (Omeprazole) .Marland Kitchen.. 1 by mouth once daily x 7 days, then as needed for indigestion 14)  Tessalon Perles 100 Mg Caps (Benzonatate) .Marland Kitchen.. 1 by mouth three times a day x 5 days, then as needed for cough  Patient Instructions: 1)  it was good to see you today. 2)  continue low dose generic zoloft for anxiety symptoms -  3)  also resume omeprazole and start tessalon  perles for cough and reflux as discussed - your prescriptions have been electronically submitted to your pharmacy. Please take as directed. Contact our office if you believe you're having problems with the medication(s).  4)  Please schedule a follow-up appointment in 3-6 months to review anxiety and medictions for same + iron, call sooner if problems.  Prescriptions: SERTRALINE HCL 25 MG TABS (SERTRALINE HCL) 1 by mouth once daily  #30 x 3   Entered and Authorized by:   Newt Lukes MD   Signed by:   Newt Lukes MD on 07/13/2010   Method used:   Electronically to        [facility]* (retail)       [address]  [place], [place]  [id number]       Ph: [phone]       Fax: [phone]   RxID:   [id number] TESSALON PERLES 100 MG CAPS (BENZONATATE) 1 by mouth three times a day x 5 days, then as needed for cough  #30 x 0   Entered and Authorized by:   Newt Lukes MD   Signed by:   Newt Lukes MD on 07/13/2010   Method used:   Electronically to        [facility]* (retail)       [address]       [place], [place]  [address]       Ph: [phone]       Fax: [phone]   RxID:   [id number] OMEPRAZOLE 20 MG CPDR (OMEPRAZOLE) 1 by mouth once daily x 7 days, then as needed for indigestion  #30 x 0   Entered and Authorized by:   Newt Lukes MD   Signed by:   Newt Lukes MD on 07/13/2010   Method used:   Electronically to        [facility]* (retail)       [address]       [place], [place]  [address]       Ph: [phone]       Fax: [phone]   RxID:   [id number]    Orders Added: 1)  Est. Patient Level IV [82423] 2)  Prescription Created Electronically [phone]

## 2010-09-11 NOTE — Assessment & Plan Note (Signed)
Summary: 6 MTH FU  STC   Vital Signs:  Patient profile:   70 year old female Height:      64 inches (162.56 cm) Weight:      142.12 pounds (64.60 kg) O2 Sat:      98 % on Room air Temp:     97.6 degrees F (36.44 degrees C) oral Pulse rate:   73 / minute BP sitting:   120 / 64  (left arm) Cuff size:   regular  Vitals Entered By: Orlan Leavens RMA (May 29, 2010 9:05 AM)  O2 Flow:  Room air CC: 6 month follow-up Is Patient Diabetic? No Pain Assessment Patient in pain? no        Primary Care Provider:  Newt Lukes MD  CC:  6 month follow-up.  History of Present Illness: here for f/u  1) c/o fatigue and anxiety symptoms  progressive symptoms  onset summer 2011 hx iron defic - on supplements for sme and follows with GI no sleep change - no energy but contiues to exercise 3-4d/week denies depression symptoms but increase in anxiety and "nervous stomach" recent TSH normal would like to try med tx for anxiety at suggestion of dtr, ?lexapro  2) allergic rhinitis - uses nasal steroid only as needed - also OTC meds as needed, daily during current season due to inc symptoms in spring with pollen  3) GERD - follows with GI for same - symptoms well controlled if on daily PPI- no stomach pains since stopping Aleve - improved also on probiotic  4) arthritis - hips, knees - trigger fingers and morton's neuroma controlled with tylenol as needed - no swelling, no falls - sees ortho as needed      Clinical Review Panels:  Prevention   Last Mammogram:  done @ [facility] Findings: there are scattered fibroglandualr densities. the breast parenchymal pattern is stable with no new worrisome finding in either breast  Impression; BI-RADS 1:Negative (01/11/2010)   Last Pap Smear:  Interpretation Result:Negative for intraepithelial Lesion or Malignancy.    (12/16/2007)   Last Colonoscopy:  Location:  [facility].  (04/30/2004)  Immunizations  Last Tetanus Booster:  Td (01/13/2004)   Last Flu Vaccine:  Fluvax 3+ (05/29/2010)   Last Zoster Vaccine:  Zostavax (05/12/2009)  CBC   WBC:  5.4 (02/27/2010)   RBC:  4.30 (02/27/2010)   Hgb:  12.9 (02/27/2010)   Hct:  38.3 (02/27/2010)   Platelets:  207.0 (02/27/2010)   MCV  89.0 (02/27/2010)   MCHC  33.8 (02/27/2010)   RDW  17.7 (02/27/2010)   PMN:  38.8 (02/27/2010)   Lymphs:  49.5 (02/27/2010)   Monos:  7.4 (02/27/2010)   Eosinophils:  3.6 (02/27/2010)   Basophil:  0.7 (02/27/2010)  Complete Metabolic Panel   Glucose:  89 (02/27/2010)   Sodium:  141 (02/27/2010)   Potassium:  4.8 (02/27/2010)   Chloride:  107 (02/27/2010)   CO2:  32 (02/27/2010)   BUN:  17 (02/27/2010)   Creatinine:  0.7 (02/27/2010)   Albumin:  4.4 (02/27/2010)   Total Protein:  7.1 (02/27/2010)   Calcium:  9.8 (02/27/2010)   Total Bili:  0.4 (02/27/2010)   Alk Phos:  50 (02/27/2010)   SGPT (ALT):  16 (02/27/2010)   SGOT (AST):  23 (02/27/2010)   Current Medications (verified): 1)  Xalatan 0.005 % Soln (Latanoprost) .... One Drop Both Eyes Once Daily 2)  Gabapentin 100 Mg Caps (Gabapentin) .... 3 Tablets By Mouth At Bedtime 3)  Vitamin B Complex-C   Caps (B Complex-C) .... One Tablet By Mouth Once Daily 4)  Calcium Carbonate-Vitamin D 600-400 Mg-Unit  Tabs (Calcium Carbonate-Vitamin D) .... One Tablet By Mouth Once Daily 5)  Cosamin Ds 500-400 Mg Tabs (Glucosamine-Chondroitin) .... 2 Tablets By Mouth Once Daily 6)  Fish Oil 1000 Mg Caps (Omega-3 Fatty Acids) .... 2 Capsules By Mouth Once Daily 7)  Tylenol Arthritis Pain 650 Mg Cr-Tabs (Acetaminophen) .... As Needed 8)  Vitamin D 1000 Unit Tabs (Cholecalciferol) .... Take 1 By Mouth Once Daily 9)  Nu-Iron 150 Mg Caps (Polysaccharide Iron Complex) .Marland Kitchen.. 1 By Mouth Once Daily As Needed 10)  Vear Clock Colon Health  Caps (Probiotic Product) .... Two Times A Day 11)  Allegra Allergy 180 Mg Tabs (Fexofenadine Hcl) .... Take 1 By Mouth Once  Daily  Allergies (verified): No Known Drug Allergies  Past History:  Past Medical History: Allergic rhinitis GERD glaucoma Osteoarthritis Low back pain - DDD iron defic thyroid goiter, hx of  MD roster: ortho - [facility] - [facility] - [facility] - [facility] - [facility]  Social History: Patient is a former smoker.   married, lives with spouse [place] Alcohol Use - yes Illicit Drug Use - no  Review of Systems  The patient denies fever, weight loss, chest pain, headaches, and abdominal pain.    Physical Exam  General:  alert, well-developed, well-nourished, and cooperative to examination.    Lungs:  normal respiratory effort, no intercostal retractions or use of accessory muscles; normal breath sounds bilaterally - no crackles and no wheezes.    Heart:  normal rate, regular rhythm, no murmur, and no rub. BLE without edema.  Psych:  Oriented X3, memory intact for recent and remote, normally interactive, good eye contact, not anxious appearing, not depressed appearing, and not agitated.      Impression & Recommendations:  Problem # 1:  ARTHRITIS (ICD-716.90)  cont tylenol and mgmt by ortho as needed for flares  Problem # 2:  ANEMIA-IRON DEFICIENCY (ICD-280.9)  reminded to take iron supplemnets as ordered and f/u GI as needed Her updated medication list for this problem includes:    Nu-iron 150 Mg Caps (Polysaccharide iron complex) .Marland Kitchen... 1 by mouth once daily as needed  chronic problem for many years without a definite diagnosis determined. Last colonoscopy was 2007.  02/2010 workup for celiac disease was negative.  Hgb: 12.9 (02/27/2010)   Hct: 38.3 (02/27/2010)   Platelets: 207.0 (02/27/2010) RBC: 4.30 (02/27/2010)   RDW: 17.7 (02/27/2010)   WBC: 5.4 (02/27/2010) MCV: 89.0 (02/27/2010)   MCHC: 33.8 (02/27/2010) Ferritin: 5.5 (02/27/2010) Iron: 95 (02/27/2010)   % Sat: 22.3 (02/27/2010) B12: 698 (02/27/2010)    Folate: 16.6 (02/27/2010)   TSH: 1.17 (02/27/2010)  Orders: TLB-Ferritin (82728-FER) TLB-IBC Pnl (Iron/FE;Transferrin) (83550-IBC)  Problem # 3:  ANXIETY STATE, UNSPECIFIED (ICD-300.00)  exac by alcoholic spouse - never on med tx for same but would like to start now as recognizes many of her stomach "issues" are worse with stress start low dose zoloft for same - f/u 4-6 weeks to reassess and titrate as needed  anticipated benefot with poss risk/SE reviewed - pt understands and agrees to same  Her updated medication list for this problem includes:    Sertraline Hcl 25 Mg Tabs (Sertraline hcl) .Marland Kitchen... 1 by mouth once daily  Orders: Prescription Created Electronically [phone])  Problem # 4:  SCREENING FOR LIPOID DISORDERS (ICD-V77.91)  Orders: TLB-Lipid Panel (80061-LIPID)  Complete Medication List: 1)  Xalatan 0.005 % Soln (Latanoprost) .... One drop both eyes once daily 2)  Gabapentin 100 Mg Caps (Gabapentin) .... 3 tablets by mouth at bedtime 3)  Vitamin B Complex-c Caps (B complex-c) .... One tablet by mouth once daily 4)  Calcium Carbonate-vitamin D 600-400 Mg-unit Tabs (Calcium carbonate-vitamin d) .... One tablet by mouth once daily 5)  Cosamin Ds 500-400 Mg Tabs (Glucosamine-chondroitin) .... 2 tablets by mouth once daily 6)  Fish Oil 1000 Mg Caps (Omega-3 fatty acids) .... 2 capsules by mouth once daily 7)  Tylenol Arthritis Pain 650 Mg Cr-tabs (Acetaminophen) .... As needed 8)  Vitamin D 1000 Unit Tabs (Cholecalciferol) .... Take 1 by mouth once daily 9)  Nu-iron 150 Mg Caps (Polysaccharide iron complex) .Marland Kitchen.. 1 by mouth once daily as needed 10)  Phillips Colon Health Caps (Probiotic product) .... Two times a day 11)  Allegra Allergy 180 Mg Tabs (Fexofenadine hcl) .... Take 1 by mouth once daily 12)  Sertraline Hcl 25 Mg Tabs (Sertraline hcl) .Marland Kitchen.. 1 by mouth once daily  Other Orders: Flu Vaccine 76yrs + MEDICARE PATIENTS (K4401) Administration Flu vaccine - MCR  (U2725)  Patient Instructions: 1)  it was good to see you today. 2)  test(s) ordered today - your results will be posted on the phone tree for review in 48-72 hours from the time of test completion; call [phone] and enter your 9 digit MRN (listed above on this page, just below your name); if any changes need to be made or there are abnormal results, you will be contacted directly.  3)  start low dose generic zoloft for anxiety symptoms - your prescription has been electronically submitted to your pharmacy. Please take as directed. Contact our office if you believe you're having problems with the medication(s).  4)  Please schedule a follow-up appointment in 4-6 weeks to review anxiety and medictions for same, call sooner if problems.  Prescriptions: SERTRALINE HCL 25 MG TABS (SERTRALINE HCL) 1 by mouth once daily  #30 x 3   Entered and Authorized by:   Newt Lukes MD   Signed by:   Newt Lukes MD on 05/29/2010   Method used:   Electronically to        [facility]* (retail)       [address]       [place], [place]  [address]       Ph: [phone]       Fax: [phone]   RxID:   [id number]    Orders Added: 1)  Flu Vaccine 45yrs + MEDICARE PATIENTS [Q2039] 2)  Administration Flu vaccine - MCR [G0008] 3)  Est. Patient Level IV [42706] 4)  TLB-Lipid Panel [80061-LIPID] 5)  TLB-Ferritin [82728-FER] 6)  TLB-IBC Pnl (Iron/FE;Transferrin) [83550-IBC] 7)  Prescription Created Electronically [phone] Flu Vaccine Consent Questions     Do you have a history of severe allergic reactions to this vaccine? no    Any prior history of allergic reactions to egg and/or gelatin? no    Do you have a sensitivity to the preservative Thimersol? no    Do you have a past history of Guillan-Barre Syndrome? no    Do you currently have an acute febrile illness? no    Have you ever had a severe reaction to latex? no    Vaccine information given and explained to patient?  yes    Are you currently pregnant? no    Lot Number:[id number]   Exp Date:02/09/2011   Site Given  Left Deltoid IMcine 1yrs + MEDICARE PATIENTS [Q2039] 2)  Administration Flu vaccine - MCR [G0008]     .lbmedflu

## 2010-09-11 NOTE — Progress Notes (Signed)
  Phone Note Other Incoming   Action Taken: Software engineer of Call: Records received from [facility]. 87 pages sent to [facility] for Dr. Felicity Coyer to review.

## 2010-09-11 NOTE — Letter (Signed)
Summary: [facility]   Imported By: Sherian Rein 12/19/2009 13:53:35  _____________________________________________________________________  External Attachment:    Type:   Image     Comment:   External Document

## 2010-09-13 NOTE — Consult Note (Signed)
Summary: Antony Contras MD/[facility]  Antony Contras MD/[facility]   Imported By: Lester Rivereno 08/28/2010 09:29:28  _____________________________________________________________________  External Attachment:    Type:   Image     Comment:   External Document

## 2010-09-13 NOTE — Assessment & Plan Note (Signed)
Summary: drainage in throat and hoarseness-lb   Vital Signs:  Patient profile:   70 year old female Height:      64 inches (162.56 cm) Weight:      140 pounds (63.64 kg) O2 Sat:      99 % on Room air Temp:     98.4 degrees F (36.89 degrees C) oral Pulse rate:   76 / minute BP sitting:   132 / 62  (left arm) Cuff size:   regular  Vitals Entered By: Orlan Leavens RMA (August 07, 2010 3:24 PM)  O2 Flow:  Room air CC: Drainage in throat & hoarse Is Patient Diabetic? No Pain Assessment Patient in pain? no        Primary Care Provider:  Newt Lukes MD  CC:  Drainage in throat & hoarse.  History of Present Illness: cont PND - clear onset >2 mo ago assoc with hoarseness - breifly improved with PPI, yessalon and antihist - but recurr once off meds - using nasal steroid - not improved  no ST or dysphagia - no weight loss, no HA or fever dry cough and constant throat clearing  also reviewed chronic med issues: 1) anxiety symptoms  progressive symptoms a/w fatigue onset summer 2011 no sleep change - no energy but continues to exercise 3-4d/week denies depression symptoms but increase in anxiety and "nervous stomach" recent TSH normal on setraline since 05/29/10 - doing well  2) allergic rhinitis - uses nasal steroid only as needed - also OTC meds as needed, daily during current season due to inc symptoms in spring with pollen  3) GERD - follows with GI for same - symptoms well controlled if on daily PPI- no stomach pains since stopping Aleve - improved also on probiotic  4) arthritis - hips, knees - trigger fingers and morton's neuroma controlled with tylenol as needed - no swelling, no falls - sees ortho as needed      Clinical Review Panels:  CBC   WBC:  5.4 (02/27/2010)   RBC:  4.30 (02/27/2010)   Hgb:  12.9 (02/27/2010)   Hct:  38.3 (02/27/2010)   Platelets:  207.0 (02/27/2010)   MCV  89.0 (02/27/2010)   MCHC  33.8 (02/27/2010)   RDW  17.7  (02/27/2010)   PMN:  38.8 (02/27/2010)   Lymphs:  49.5 (02/27/2010)   Monos:  7.4 (02/27/2010)   Eosinophils:  3.6 (02/27/2010)   Basophil:  0.7 (02/27/2010)  Complete Metabolic Panel   Glucose:  89 (02/27/2010)   Sodium:  141 (02/27/2010)   Potassium:  4.8 (02/27/2010)   Chloride:  107 (02/27/2010)   CO2:  32 (02/27/2010)   BUN:  17 (02/27/2010)   Creatinine:  0.7 (02/27/2010)   Albumin:  4.4 (02/27/2010)   Total Protein:  7.1 (02/27/2010)   Calcium:  9.8 (02/27/2010)   Total Bili:  0.4 (02/27/2010)   Alk Phos:  50 (02/27/2010)   SGPT (ALT):  16 (02/27/2010)   SGOT (AST):  23 (02/27/2010)   Current Medications (verified): 1)  Xalatan 0.005 % Soln (Latanoprost) .... One Drop Both Eyes Once Daily 2)  Gabapentin 100 Mg Caps (Gabapentin) .... 3 Tablets By Mouth At Bedtime 3)  Vitamin B Complex-C   Caps (B Complex-C) .... One Tablet By Mouth Once Daily 4)  Calcium Carbonate-Vitamin D 600-400 Mg-Unit  Tabs (Calcium Carbonate-Vitamin D) .... One Tablet By Mouth Once Daily 5)  Cosamin Ds 500-400 Mg Tabs (Glucosamine-Chondroitin) .... 2 Tablets By Mouth Once Daily 6)  Fish Oil  1000 Mg Caps (Omega-3 Fatty Acids) .... 2 Capsules By Mouth Once Daily 7)  Tylenol Arthritis Pain 650 Mg Cr-Tabs (Acetaminophen) .... As Needed 8)  Vitamin D 1000 Unit Tabs (Cholecalciferol) .... Take 1 By Mouth Once Daily 9)  Nu-Iron 150 Mg Caps (Polysaccharide Iron Complex) .Marland Kitchen.. 1 By Mouth Once Daily As Needed 10)  Vear Clock Colon Health  Caps (Probiotic Product) .... Two Times A Day 11)  Allegra Allergy 180 Mg Tabs (Fexofenadine Hcl) .... Take 1 By Mouth Once Daily 12)  Sertraline Hcl 25 Mg Tabs (Sertraline Hcl) .Marland Kitchen.. 1 By Mouth Once Daily 13)  Omeprazole 20 Mg Cpdr (Omeprazole) .Marland Kitchen.. 1 By Mouth Once Daily X 7 Days, Then As Needed For Indigestion 14)  Tessalon Perles 100 Mg Caps (Benzonatate) .Marland Kitchen.. 1 By Mouth Three Times A Day X 5 Days, Then As Needed For Cough  Allergies (verified): No Known Drug  Allergies  Past History:  Past Medical History: Allergic rhinitis GERD  glaucoma  Osteoarthritis Low back pain - DDD iron defic thyroid goiter, hx of  MD roster:   ortho - [place] [facility] - [facility] - [facility] - [facility] - [facility] -[facility]  Family History: Family History of Diabetes: mother Family History of Heart Disease: mother Family History of Colon CA 1st degree relative <60 (grandparent) Family History of Arthritis (parent) Family History Hypertension (mother)  Social History: Patient is a former smoker - quit 1990  married, 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    Physical Exam  General:  alert, well-developed, well-nourished, and cooperative to examination.    Head:  Normocephalic and atraumatic without obvious abnormalities. No apparent alopecia or balding. Eyes:  vision grossly intact; pupils equal, round and reactive to light.  conjunctiva and lids normal.    Ears:  normal pinnae bilaterally, without erythema, swelling, or tenderness to palpation. TMs clear, without effusion, or cerumen impaction. Hearing grossly normal bilaterally  Mouth:  teeth and gums in good repair; mucous membranes moist, without lesions or ulcers. oropharynx clear without exudate, no erythema.  Neck:  supple, full ROM, no masses, no thyromegaly; no thyroid nodules or tenderness. no JVD or carotid bruits.   Lungs:  normal respiratory effort, no intercostal retractions or use of accessory muscles; normal breath sounds bilaterally - no crackles and no wheezes.    Heart:  normal rate, regular rhythm, no murmur, and no rub. BLE without edema.   Impression & Recommendations:  Problem # 1:  HOARSENESS (UXL-244.01)  progressive > 2 weeks - suspect re;ated to PND/gerd but as unimproved with conserv tx and remote  smoking hx, refer to ENT for eval and tx - resume and cont antihist, ppi and cough supression until ENT eval  Orders: ENT Referral (ENT)  Problem # 2:  COUGH (ICD-786.2) see above - exam clear and O2 normal, afeb hold abx - tx as above and await ENT eval tussionex to control bedtime cough symptoms   Complete Medication List: 1)  Xalatan 0.005 % Soln (Latanoprost) .... One drop both eyes once daily 2)  Gabapentin 100 Mg Caps (Gabapentin) .... 3 tablets by mouth at bedtime 3)  Vitamin B Complex-c Caps (B complex-c) .... One tablet by mouth once daily 4)  Calcium Carbonate-vitamin D 600-400 Mg-unit Tabs (Calcium carbonate-vitamin d) .... One tablet by mouth once daily 5)  Cosamin Ds 500-400 Mg Tabs (Glucosamine-chondroitin) .... 2  tablets by mouth once daily 6)  Fish Oil 1000 Mg Caps (Omega-3 fatty acids) .... 2 capsules by mouth once daily 7)  Tylenol Arthritis Pain 650 Mg Cr-tabs (Acetaminophen) .... As needed 8)  Vitamin D 1000 Unit Tabs (Cholecalciferol) .... Take 1 by mouth once daily 9)  Nu-iron 150 Mg Caps (Polysaccharide iron complex) .Marland Kitchen.. 1 by mouth once daily as needed 10)  Phillips Colon Health Caps (Probiotic product) .... Two times a day 11)  Allegra Allergy 180 Mg Tabs (Fexofenadine hcl) .... Take 1 by mouth once daily 12)  Sertraline Hcl 25 Mg Tabs (Sertraline hcl) .Marland Kitchen.. 1 by mouth once daily 13)  Omeprazole 20 Mg Cpdr (Omeprazole) .Marland Kitchen.. 1 by mouth once daily x 7 days, then as needed for indigestion 14)  Tessalon Perles 100 Mg Caps (Benzonatate) .Marland Kitchen.. 1 by mouth three times a day x 5 days, then as needed for cough 15)  Tussionex Pennkinetic Er 10-8 Mg/74ml Lqcr (Hydrocod polst-chlorphen polst) .... 5 cc by mouth at bedtime x 7 days, then as needed for cough symptoms  Patient Instructions: 1)  it was good to see you today. 2)  we'll make referral to ENT. Our office will contact you regarding this appointment once made.  3)  also use tussionex for cough at bedtime and resume  omeprazole plus tessalon perles for cough and reflux as discussed - your prescriptions have been electronically submitted to your pharmacy. Please take as directed. Contact our office if you believe you're having problems with the medication(s).  4)  Please keep scheduled follow-up appointment in 3-6 months to review anxiety and medictions (sertraline) + iron, call sooner if problems.  Prescriptions: TESSALON PERLES 100 MG CAPS (BENZONATATE) 1 by mouth three times a day x 5 days, then as needed for cough  #40 x 1   Entered and Authorized by:   Newt Lukes MD   Signed by:   Newt Lukes MD on 08/07/2010   Method used:   Electronically to        [facility]* (retail)       [address]       [place], [place]  [address]       Ph: [phone]       Fax: [phone]   RxID:   [id number] Sandria Senter ER 10-8 MG/5ML LQCR (HYDROCOD POLST-CHLORPHEN POLST) 5 cc by mouth at bedtime x 7 days, then as needed for cough symptoms  #100cc x 0   Entered and Authorized by:   Newt Lukes MD   Signed by:   Newt Lukes MD on 08/07/2010   Method used:   Printed then faxed to ...       [facility]* (retail)       [address]       [place], [place]  [id number]       Ph: [phone]       Fax: [phone]   RxID:   [id number]    Orders Added: 1)  Est. Patient Level IV [30160] 2)  ENT Referral [ENT]

## 2010-10-16 ENCOUNTER — Telehealth: Payer: Self-pay | Admitting: Internal Medicine

## 2010-10-23 NOTE — Progress Notes (Signed)
Summary: Rx req  Phone Note Call from Patient Call back at Home Phone [phone]   Caller: Patient Summary of Call: Pt called requests Rx for yeast infection. Initial call taken by: Margaret Pyle, CMA,  October 16, 2010 2:31 PM  Follow-up for Phone Call        generic diflucan - erx done Follow-up by: Newt Lukes MD,  October 17, 2010 8:18 AM  Additional Follow-up for Phone Call Additional follow up Details #1::        Pt informed Additional Follow-up by: Margaret Pyle, CMA,  October 17, 2010 8:28 AM    New/Updated Medications: FLUCONAZOLE 150 MG TABS (FLUCONAZOLE) 1 by mouth x 1, repeat next day if needed Prescriptions: FLUCONAZOLE 150 MG TABS (FLUCONAZOLE) 1 by mouth x 1, repeat next day if needed  #2 x 0   Entered and Authorized by:   Newt Lukes MD   Signed by:   Newt Lukes MD on 10/17/2010   Method used:   Electronically to        [facility]* (retail)       [address]       [place], [place]  [address]       Ph: [phone]       Fax: [phone]   RxID:   [id number]

## 2010-11-10 ENCOUNTER — Encounter: Payer: Self-pay | Admitting: Internal Medicine

## 2010-12-28 NOTE — Procedures (Signed)
[place]  Patient:    Linda [name], Linda [name]                      MRN: [id number] Proc. Date: 01/09/00 Adm. Date:  [id number] Disc. Date: [id number] Attending:  Mardella Layman CC:         Sonda Primes, M.D. [facility]                           Procedure Report  PROCEDURE PERFORMED:  Colonoscopy.  ENDOSCOPIST:  Vania Rea. Jarold Motto, M.D. [facility]  INDICATIONS FOR PROCEDURE:  The patient is a 70 year old white female who I saw in my office on Dec 27, 1999 because of sudden onset of iron deficiency anemia, with a hemoglobin of 8.0.  She really denied any GI complaints whatsoever, denied abuse of NSAIDs or salicylates.  She had a previous flexible sigmoidoscopy in 1995 and October 2000 that were unremarkable.  She does have a family history of colon carcinoma in her grandmother.  In my office physical exam was unremarkable and stool was guaiac negative.  However, it was felt that the patient was a candidate for colonoscopy and possible endoscopy.  The risks and benefits of these procedures were explained in detail, and she agreed to proceed as planned.  Preoperative cardiopulmonary and mental status exams were unremarkable.  Throughout this procedure, the patient was on pulse oximetery and cardiac monitoring.  The patient tolerated the procedure well and received supplemental low-flow oxygen by nasal cannula throughout the procedure. She was anesthetized with 50 mcg of IV fentanyl and 5 mg of IV Versed.  Inspection of her rectum was unremarkable as was rectal exam.  The rectum was intubated with the adult Olympus video colonoscope.  This was advanced without difficulty through a fairly well prepped colon and cecum.  The patients cecum and ileocecal valve appeared normal.  The ileocecal valve was easily intubated.  The terminal ileum for approximately 10 to 15 cm appeared normal. There was a flat linear polyp in the cecum which appeared to be either an early  adenoma or hyperplastic polyp.  This was removed with electrocautery snare at an 18 watt coag setting.  This tissue was retrieved and sent to pathology for exam.  The colonoscope was then slowly withdrawn throughout the length of the colon which otherwise was free of any significant mucosal polypoid lesions including retroflex view of the rectum.  Air was withdrawn as best as possible from the lower GI tract.  The patient tolerated the procedure well.  ASSESSMENT:  This is a fairly unremarkable colonoscopy to the terminal ileum except for a small cecal polyp--rule out adenoma.  I certainly see no cause for chronic GI blood loss.  RECOMMENDATIONS: 1. Standard postpolypectomy orders and precautions. 2. Follow up on pathology result of polyp. 3. Proceed with upper GI panendoscopy. DD:  01/09/00 TD:  01/11/00 Job: [id number]

## 2010-12-28 NOTE — Consult Note (Signed)
[facility]                            ENDOCRINOLOGY CONSULTATION   Linda [name], KOEBEL                    MRN:          [id number]  DATE:04/03/2006                            DOB:          1941-02-06    REASON FOR VISIT:  Follow up goiter.   HISTORY OF PRESENT ILLNESS:  [age]-year-old woman with a history of a  multinodular goiter.  A biopsy was benign several years ago and an  ultrasound last year showed no change in the size.  She states she does not  notice the goiter.   PAST MEDICAL HISTORY:  1. Iron deficiency anemia.  2. Carpal tunnel syndrome.  3. GERD.  4. Osteoarthritis.  5. Colonic polyps.   REVIEW OF SYSTEMS:  Denies difficulty swallowing or breathing.   PHYSICAL EXAMINATION:  VITAL SIGNS:  Blood pressure 125/71, heart rate 82,  temperature 97.7.  The weight is 148.  GENERAL:  No distress.  NECK:  I do not appreciate the goiter.   LABORATORY STUDIES:  The patient states she recently had a complete panel  of laboratory blood tests with Dr. Corliss Blacker and that these were normal.   IMPRESSION:  1. Stable multinodular goiter.  2. It is likely that her recent blood tests included thyroid functions, so      she is presumed euthyroid.   PLAN:  1. I told the patient I am sending a copy of this report to Dr. Corliss Blacker      asking her to verify that she had a normal TSH this year.  2. If so, I told the patient she only needs to return here on a p.r.n.      basis.  She would need an annual TSH and physical examination of the      thyroid, and I am happy to see her back if there is any question about      either of these.  I have told the patient that the most common      natural history of multinodular goiter is the eventual development of      hyperthyroidism, and I would need to see her back if she would develop      a suppressed TSH.                                   Sean A. Everardo All, MD   SAE/MedQ  DD:  04/04/2006  DT:   04/04/2006  Job #:  [id number]   cc:   Pam Drown, MD

## 2010-12-28 NOTE — Procedures (Signed)
[place]. [facility]  Patient:    Linda [name], Linda [name]                      MRN: [id number] Proc. Date: 01/09/00 Adm. Date:  [id number] Disc. Date: [id number] Attending:  Mardella Layman CC:         Sonda Primes, M.D. [facility]                           Procedure Report  PROCEDURE PERFORMED:  Upper endoscopy.  ENDOSCOPIST:  Vania Rea. Jarold Motto, M.D. [facility]  INDICATIONS FOR PROCEDURE:  The patient is a 70 year old white female with unexplained iron deficiency anemia and negative colonoscopy.  It was felt that endoscopy was indicated for diagnostic purposes.  The risks and benefits of this procedure were explained in detail, and she agreed to proceed as planned. Preoperative cardiac, pulmonary and mental status exams were unremarkable.  DESCRIPTION OF PROCEDURE:  Throughout this procedure the patient was on pulse oximetery and cardiac monitoring.  The patient tolerated the procedure well receiving supplemental low-flow oxygen by nasal cannula throughout the procedure.  She was anesthetized with Cetacaine spray of the oropharynx, fentanyl 50 mcg IV and Versed 5 mg IV.  She was intubated easily with the Olympus adult video endoscope.  The esophagus throughout its length was entirely unremarkable without mucosal or polypoid lesions.  The endoscope easily passed into the stomach.  There were no retained food products or blood in the stomach.  The mucosal pattern of the fundus, body, antrum and pylorus of the stomach was normal as was the duodenal sweep.  Multiple biopsies were obtained of the duodenum for pathologic exam.  The endoscope was then withdrawn to the stomach and retroflexed into the J position.  Examination of the fundus and cardia of the stomach from this position showed no intrinsic lesions.  She was extubated without difficulty.  The patient tolerated the procedure well.  She was returned in stable condition to the recovery room  for observation.  ASSESSMENT:  This is an unremarkable endoscopy without any changes that would explain chronic GI blood loss.  Small bowel biopsies were obtained to exclude celiac sprue.  RECOMMENDATIONS: 1. Follow up on small bowel biopsies. 2. Check small bowel series to complete work-up. 3. Office follow up in two weeks time.  DD:  01/09/00 TD:  01/11/00 Job: [id number]

## 2011-01-09 ENCOUNTER — Encounter: Payer: Self-pay | Admitting: Internal Medicine

## 2011-01-09 ENCOUNTER — Ambulatory Visit (INDEPENDENT_AMBULATORY_CARE_PROVIDER_SITE_OTHER): Payer: Medicare Other | Admitting: Internal Medicine

## 2011-01-09 ENCOUNTER — Other Ambulatory Visit (INDEPENDENT_AMBULATORY_CARE_PROVIDER_SITE_OTHER): Payer: Medicare Other

## 2011-01-09 DIAGNOSIS — F411 Generalized anxiety disorder: Secondary | ICD-10-CM

## 2011-01-09 DIAGNOSIS — D509 Iron deficiency anemia, unspecified: Secondary | ICD-10-CM

## 2011-01-09 DIAGNOSIS — J309 Allergic rhinitis, unspecified: Secondary | ICD-10-CM

## 2011-01-09 LAB — CBC WITH DIFFERENTIAL/PLATELET
Basophils Relative: 0.6 % (ref 0.0–3.0)
Eosinophils Relative: 4.1 % (ref 0.0–5.0)
MCV: 92 fl (ref 78.0–100.0)
Monocytes Absolute: 0.4 10*3/uL (ref 0.1–1.0)
Monocytes Relative: 7.4 % (ref 3.0–12.0)
Neutrophils Relative %: 40.1 % — ABNORMAL LOW (ref 43.0–77.0)
RBC: 4.12 Mil/uL (ref 3.87–5.11)
WBC: 5.1 10*3/uL (ref 4.5–10.5)

## 2011-01-09 MED ORDER — PHILLIPS COLON HEALTH PO CAPS: 1.0000 | ORAL_CAPSULE | Freq: Every day | ORAL | Status: DC

## 2011-01-09 MED ORDER — OMEPRAZOLE 20 MG PO TBEC: 1.0000 | DELAYED_RELEASE_TABLET | Freq: Two times a day (BID) | ORAL | Status: DC

## 2011-01-09 NOTE — Assessment & Plan Note (Signed)
Long hx same - prior GI eval EGD 2007, colo 2005 unremarkable- On oral iron -recheck levels ferritin and CBC now - consider repeat GI eval Lab Results  Component Value Date   WBC 5.4 02/27/2010   HGB 12.9 02/27/2010   HCT 38.3 02/27/2010   MCV 89.0 02/27/2010   PLT 207.0 02/27/2010   Lab Results  Component Value Date   FERRITIN 5.0* 05/29/2010

## 2011-01-09 NOTE — Assessment & Plan Note (Signed)
OTC allegra - contributing to periodic hoarseness symptoms (s/p ENT eval 09/2010 reviewed) The current medical regimen is effective;  continue present plan and medications.

## 2011-01-09 NOTE — Progress Notes (Signed)
  Subjective:    Patient ID: Linda [name], female    DOB: [birth year], 70 y.o.   MRN: [id number]  HPI Here for follow up - reviewed chronic med issues:   Iron defic, hx anemia - prior GI eval - last in 2007 egd, colo 2005 No BPBPR or change GERD and swallow symptoms    anxiety symptoms  onset summer 2011  no energy but continues to exercise 3-4d/week  denies depression symptoms but increase in anxiety and "nervous stomach"  started setraline since 05/29/10 - doing well but still stressed by spouse and his illness  allergic rhinitis -  uses nasal steroid only as needed -  also OTC meds daily   GERD - follows with GI for same -  symptoms well controlled if on daily PPI-  no stomach pains since stopping Aleve - improved also on probiotic   arthritis - hips, knees - trigger fingers and morton's neuroma  controlled with tylenol as needed -  no swelling, no falls -  sees ortho as needed   Past Medical History  Diagnosis Date  . TOBACCO USE, QUIT   . COLONIC POLYPS, HX OF 2007  . ALLERGIC RHINITIS   . ANEMIA-IRON DEFICIENCY   . Anxiety state, unspecified   . OSTEOARTHRITIS   . GERD   . GLAUCOMA   . Carpal tunnel syndrome   . HIATAL HERNIA WITH REFLUX   . GOITER, MULTINODULAR      Review of Systems  Constitutional: Negative for fever and activity change.  Respiratory: Negative for wheezing.   Cardiovascular: Negative for chest pain.  Musculoskeletal: Negative for joint swelling.       Objective:   Physical Exam BP 132/62  Pulse 75  Temp(Src) 97.9 F (36.6 C) (Oral)  Ht 5\' 4"  (1.626 m)  Wt 144 lb 3.2 oz (65.409 kg)  BMI 24.75 kg/m2  SpO2 96% Physical Exam  Constitutional: She is oriented to person, place, and time. She appears well-developed and well-nourished. No distress.  Eyes: Conjunctivae and EOM are normal. Pupils are equal, round, and reactive to light. No scleral icterus.  Neck: Normal range of motion. Neck supple. No JVD present. No thyromegaly  present.  Cardiovascular: Normal rate, regular rhythm and normal heart sounds.  No murmur heard. No BLE edema. Psychiatric: She has a normal mood and affect. Her behavior is normal. Judgment and thought content normal.   Lab Results  Component Value Date   WBC 5.4 02/27/2010   HGB 12.9 02/27/2010   HCT 38.3 02/27/2010   PLT 207.0 02/27/2010   CHOL 188 05/29/2010   TRIG 68.0 05/29/2010   HDL 74.40 05/29/2010   ALT 16 02/27/2010   AST 23 02/27/2010   NA 141 02/27/2010   K 4.8 02/27/2010   CL 107 02/27/2010   CREATININE 0.7 02/27/2010   BUN 17 02/27/2010   CO2 32 02/27/2010   TSH 1.17 02/27/2010        Assessment & Plan:  See problem list. Medications and labs reviewed today.

## 2011-01-09 NOTE — Assessment & Plan Note (Signed)
Started sertraline 05/2010- continued spouse stressors (retirement, depression) but overall improved Cont same - support/counseling provided

## 2011-01-09 NOTE — Patient Instructions (Signed)
It was good to see you today. Medications reviewed, no changes at this time. Test(s) ordered today. Your results will be called to you after review (48-72hours after test completion). If any changes need to be made, you will be notified at that time. Please schedule followup in 6 months, call sooner if problems.

## 2011-01-22 ENCOUNTER — Telehealth: Payer: Self-pay | Admitting: *Deleted

## 2011-01-22 NOTE — Telephone Encounter (Signed)
voltaren gel has not been study for back pain indication - i can not recommend use gel for back pain although it probably is not doing harm - thanks

## 2011-01-22 NOTE — Telephone Encounter (Signed)
Pt informed of MD's advisement. 

## 2011-01-22 NOTE — Telephone Encounter (Signed)
Pt wants MD's advisement on whether she can use Voltaren gel for her arthritis/back pain. She states that the medication has helped with her pain.

## 2011-01-22 NOTE — Telephone Encounter (Signed)
Left message for pt to callback office.  

## 2011-02-09 ENCOUNTER — Encounter: Payer: Self-pay | Admitting: Internal Medicine

## 2011-02-12 ENCOUNTER — Other Ambulatory Visit: Payer: Self-pay | Admitting: *Deleted

## 2011-02-12 MED ORDER — SERTRALINE HCL 25 MG PO TABS: 25.0000 mg | ORAL_TABLET | Freq: Every day | ORAL | Status: DC

## 2011-06-13 ENCOUNTER — Other Ambulatory Visit: Payer: Self-pay | Admitting: *Deleted

## 2011-06-13 MED ORDER — SERTRALINE HCL 25 MG PO TABS: 25.0000 mg | ORAL_TABLET | Freq: Every day | ORAL | Status: DC

## 2011-07-12 ENCOUNTER — Other Ambulatory Visit (INDEPENDENT_AMBULATORY_CARE_PROVIDER_SITE_OTHER): Payer: Medicare Other

## 2011-07-12 ENCOUNTER — Encounter: Payer: Self-pay | Admitting: Internal Medicine

## 2011-07-12 ENCOUNTER — Ambulatory Visit (INDEPENDENT_AMBULATORY_CARE_PROVIDER_SITE_OTHER): Payer: Medicare Other | Admitting: Internal Medicine

## 2011-07-12 DIAGNOSIS — M1990 Unspecified osteoarthritis, unspecified site: Secondary | ICD-10-CM

## 2011-07-12 DIAGNOSIS — D510 Vitamin B12 deficiency anemia due to intrinsic factor deficiency: Secondary | ICD-10-CM

## 2011-07-12 DIAGNOSIS — D509 Iron deficiency anemia, unspecified: Secondary | ICD-10-CM

## 2011-07-12 DIAGNOSIS — R110 Nausea: Secondary | ICD-10-CM

## 2011-07-12 DIAGNOSIS — Z23 Encounter for immunization: Secondary | ICD-10-CM

## 2011-07-12 DIAGNOSIS — J309 Allergic rhinitis, unspecified: Secondary | ICD-10-CM

## 2011-07-12 LAB — CBC WITH DIFFERENTIAL/PLATELET
Basophils Absolute: 0 10*3/uL (ref 0.0–0.1)
Eosinophils Absolute: 0.3 10*3/uL (ref 0.0–0.7)
HCT: 38.9 % (ref 36.0–46.0)
Lymphs Abs: 2.3 10*3/uL (ref 0.7–4.0)
MCHC: 33.8 g/dL (ref 30.0–36.0)
MCV: 92.7 fl (ref 78.0–100.0)
Monocytes Absolute: 0.5 10*3/uL (ref 0.1–1.0)
Platelets: 220 10*3/uL (ref 150.0–400.0)
RDW: 14.5 % (ref 11.5–14.6)

## 2011-07-12 LAB — HEPATIC FUNCTION PANEL
AST: 26 U/L (ref 0–37)
Albumin: 4.4 g/dL (ref 3.5–5.2)

## 2011-07-12 LAB — VITAMIN B12: Vitamin B-12: 441 pg/mL (ref 211–911)

## 2011-07-12 NOTE — Assessment & Plan Note (Signed)
Long hx same - prior GI eval EGD 2007, colo 2005 unremarkable- On oral iron -recheck levels ferritin, b12 and CBC now - consider repeat GI eval  Lab Results  Component Value Date   WBC 5.1 01/09/2011   HGB 12.8 01/09/2011   HCT 37.9 01/09/2011   MCV 92.0 01/09/2011   PLT 202.0 01/09/2011   Lab Results  Component Value Date   FERRITIN 8.0* 01/09/2011

## 2011-07-12 NOTE — Assessment & Plan Note (Signed)
OTC allegra - contributing to periodic hoarseness symptoms (s/p ENT eval 09/2010 reviewed) The current medical regimen is effective;  continue present plan and medications.

## 2011-07-12 NOTE — Patient Instructions (Signed)
It was good to see you today. Medications reviewed, no changes at this time. Test(s) ordered today. Your results will be called to you after review (48-72hours after test completion). If any changes need to be made, you will be notified at that time. Pneumonia shot done today - other immunizations up to date Please schedule followup in 6 months for review, call sooner if problems.

## 2011-07-12 NOTE — Progress Notes (Signed)
  Subjective:    Patient ID: Linda [name], female    DOB: 01/13/1941, 70 y.o.   MRN: [id number]  HPI  Here for follow up - reviewed chronic med issues:   Iron defic, hx anemia - prior GI eval - last in 2007 egd, colo 2005 No BPBPR or change GERD and swallow symptoms   anxiety symptoms  onset summer 2011  no energy but continues to exercise 3-4d/week  denies depression symptoms but increase in anxiety and "nervous stomach"  started setraline since 05/29/10 - doing well but still stressed by spouse and his illness  allergic rhinitis -  uses nasal steroid only as needed -  also OTC meds daily   GERD - follows with GI for same -  symptoms well controlled if on daily PPI-  no stomach pains since stopping Aleve - improved also on probiotic   arthritis - hips, knees, back, trigger finger and morton's neuroma  controlled with tylenol as needed - also successful relief with Voltaren gel no swelling, no falls -  sees ortho as needed   Past Medical History  Diagnosis Date  . TOBACCO USE, QUIT   . COLONIC POLYPS, HX OF 2007  . ALLERGIC RHINITIS   . ANEMIA-IRON DEFICIENCY   . Anxiety state, unspecified   . OSTEOARTHRITIS   . GERD   . GLAUCOMA   . Carpal tunnel syndrome   . HIATAL HERNIA WITH REFLUX   . GOITER, MULTINODULAR      Review of Systems  Constitutional: Negative for fever and activity change.  Respiratory: Negative for wheezing.   Cardiovascular: Negative for chest pain.  Musculoskeletal: Negative for joint swelling.       Objective:   Physical Exam  BP 140/72  Pulse 74  Temp(Src) 97.6 F (36.4 C) (Oral)  Resp 14  Ht 5\' 3"  (1.6 m)  Wt 144 lb 8 oz (65.545 kg)  BMI 25.60 kg/m2  SpO2 96% Wt Readings from Last 3 Encounters:  07/12/11 144 lb 8 oz (65.545 kg)  01/09/11 144 lb 3.2 oz (65.409 kg)  08/07/10 140 lb (63.504 kg)   Constitutional: She appears well-developed and well-nourished. No distress.  Eyes: Conjunctivae and EOM are normal. Pupils are  equal, round, and reactive to light. No scleral icterus.  Neck: Normal range of motion. Neck supple. No JVD present. No thyromegaly present.  Cardiovascular: Normal rate, regular rhythm and normal heart sounds.  No murmur heard. No BLE edema. Psychiatric: She has a normal mood and affect. Her behavior is normal. Judgment and thought content normal.   Lab Results  Component Value Date   WBC 5.1 01/09/2011   HGB 12.8 01/09/2011   HCT 37.9 01/09/2011   PLT 202.0 01/09/2011   CHOL 188 05/29/2010   TRIG 68.0 05/29/2010   HDL 74.40 05/29/2010   ALT 16 02/27/2010   AST 23 02/27/2010   NA 141 02/27/2010   K 4.8 02/27/2010   CL 107 02/27/2010   CREATININE 0.7 02/27/2010   BUN 17 02/27/2010   CO2 32 02/27/2010   TSH 1.17 02/27/2010        Assessment & Plan:  See problem list. Medications and labs reviewed today.

## 2011-07-12 NOTE — Assessment & Plan Note (Signed)
Follows with [facility] for injections on trigger fingers as needed minimal NSAIDs use due to GERD/nausea symptoms

## 2011-07-19 ENCOUNTER — Encounter: Payer: Self-pay | Admitting: Internal Medicine

## 2011-07-19 ENCOUNTER — Ambulatory Visit (INDEPENDENT_AMBULATORY_CARE_PROVIDER_SITE_OTHER): Payer: Medicare Other | Admitting: Internal Medicine

## 2011-07-19 VITALS — BP 130/78 | HR 72 | Temp 98.0°F

## 2011-07-19 DIAGNOSIS — R51 Headache: Secondary | ICD-10-CM

## 2011-07-19 DIAGNOSIS — R6884 Jaw pain: Secondary | ICD-10-CM

## 2011-07-19 MED ORDER — HYDROCODONE-ACETAMINOPHEN 5-500 MG PO TABS: 1.0000 | ORAL_TABLET | ORAL | Status: DC | PRN

## 2011-07-19 MED ORDER — FLUTICASONE PROPIONATE 50 MCG/ACT NA SUSP: 2.0000 | Freq: Every day | NASAL | Status: DC

## 2011-07-19 MED ORDER — OXYMETAZOLINE HCL 0.05 % NA SOLN: 2.0000 | Freq: Two times a day (BID) | NASAL | Status: AC

## 2011-07-19 NOTE — Progress Notes (Signed)
  Subjective:    Patient ID: Linda [name], female    DOB: Nov 05, 1940, 70 y.o.   MRN: [id number]  HPI Complains of headaches Located in the front of the face, right maxillary region and medially at right eye/nose junction Symptoms have been intermittent but increasing in frequency and intensity Onset of symptoms 6 weeks ago, progressive course Describes as constant ache or stab -no pulsation Yesterday associated with pain in upper dental teeth on right side Denies vision changes, eye pain, ear pain or drainage Not improved with NSAIDs or neti pot  Past Medical History  Diagnosis Date  . TOBACCO USE, QUIT   . COLONIC POLYPS, HX OF 2007  . ALLERGIC RHINITIS   . ANEMIA-IRON DEFICIENCY   . Anxiety state, unspecified   . OSTEOARTHRITIS   . GERD   . GLAUCOMA   . Carpal tunnel syndrome   . HIATAL HERNIA WITH REFLUX   . GOITER, MULTINODULAR      Review of Systems  HENT: Positive for congestion and sinus pressure. Negative for nosebleeds, sneezing and postnasal drip.   Eyes: Negative for photophobia, pain and redness.  Neurological: Negative for dizziness, tremors, seizures, syncope, facial asymmetry, speech difficulty, weakness and numbness.       Objective:   Physical Exam BP 130/78  Pulse 72  Temp(Src) 98 F (36.7 C) (Oral)  SpO2 96% Wt Readings from Last 3 Encounters:  07/12/11 144 lb 8 oz (65.545 kg)  01/09/11 144 lb 3.2 oz (65.409 kg)  08/07/10 140 lb (63.504 kg)   Constitutional: She appears well-developed and well-nourished. No distress.  HENT: Head: Normocephalic and atraumatic. Mildly tender over medial side of right maxillary sinus. Ears: B TMs ok, no erythema or effusion; Nose: Nose normal. Mouth/Throat: Oropharynx is clear and moist. No oropharyngeal exudate.  Eyes: Conjunctivae and EOM are normal. Pupils are equal, round, and reactive to light. No scleral icterus. Neck: Normal range of motion. Neck supple. No JVD present. No thyromegaly present.    Cardiovascular: Normal rate, regular rhythm and normal heart sounds.  No murmur heard. No BLE edema. Pulmonary/Chest: Effort normal and breath sounds normal. No respiratory distress. She has no wheezes.  Neurological: She is alert and oriented to person, place, and time. No cranial nerve deficit. Coordination normal.  Psychiatric: She has a normal mood and affect. Her behavior is normal. Judgment and thought content normal.   Lab Results  Component Value Date   WBC 6.0 07/12/2011   HGB 13.1 07/12/2011   HCT 38.9 07/12/2011   PLT 220.0 07/12/2011   GLUCOSE 89 02/27/2010   CHOL 188 05/29/2010   TRIG 68.0 05/29/2010   HDL 74.40 05/29/2010   LDLCALC 100* 05/29/2010   ALT 21 07/12/2011   AST 26 07/12/2011   NA 141 02/27/2010   K 4.8 02/27/2010   CL 107 02/27/2010   CREATININE 0.7 02/27/2010   BUN 17 02/27/2010   CO2 32 02/27/2010   TSH 1.17 02/27/2010        Assessment & Plan:   R maxillary pain/headache - check ct head/sinus Add topical decongest, saline wash and nasal steroids No neuro deficits on exam Okay to use Vicodin when necessary severe episodes of pain

## 2011-07-19 NOTE — Patient Instructions (Signed)
It was good to see you today. we'll make referral for head scan to look at sinuses as discussed. Our office will contact you regarding appointment(s) once made. For severe pain symptoms, take Vicodin as needed Start Afrin (over-the-counter) spray twice daily for the next 5 days and prescription Flonase once daily for sinus symptoms Your prescription(s) have been submitted to your pharmacy. Please take as directed and contact our office if you believe you are having problem(s) with the medication(s). If headache symptoms or sinus pain worse rather than better, call for further evaluation and recommendations

## 2011-07-24 ENCOUNTER — Ambulatory Visit (INDEPENDENT_AMBULATORY_CARE_PROVIDER_SITE_OTHER)
Admission: RE | Admit: 2011-07-24 | Discharge: 2011-07-24 | Disposition: A | Discharge status: Home / Self Care | Payer: Medicare Other | Source: Ambulatory Visit | Attending: Internal Medicine | Admitting: Internal Medicine

## 2011-07-24 DIAGNOSIS — R6884 Jaw pain: Secondary | ICD-10-CM

## 2011-07-24 DIAGNOSIS — R51 Headache: Secondary | ICD-10-CM

## 2011-08-20 DIAGNOSIS — H905 Unspecified sensorineural hearing loss: Secondary | ICD-10-CM | POA: Diagnosis not present

## 2011-08-20 DIAGNOSIS — J323 Chronic sphenoidal sinusitis: Secondary | ICD-10-CM | POA: Diagnosis not present

## 2011-09-18 DIAGNOSIS — M6530 Trigger finger, unspecified finger: Secondary | ICD-10-CM | POA: Diagnosis not present

## 2011-09-18 DIAGNOSIS — M7700 Medial epicondylitis, unspecified elbow: Secondary | ICD-10-CM | POA: Diagnosis not present

## 2011-09-20 DIAGNOSIS — J323 Chronic sphenoidal sinusitis: Secondary | ICD-10-CM | POA: Diagnosis not present

## 2011-10-08 DIAGNOSIS — R0982 Postnasal drip: Secondary | ICD-10-CM | POA: Diagnosis not present

## 2011-10-08 DIAGNOSIS — J323 Chronic sphenoidal sinusitis: Secondary | ICD-10-CM | POA: Diagnosis not present

## 2011-10-14 ENCOUNTER — Ambulatory Visit (INDEPENDENT_AMBULATORY_CARE_PROVIDER_SITE_OTHER): Payer: Medicare Other | Admitting: Internal Medicine

## 2011-10-14 ENCOUNTER — Encounter: Payer: Self-pay | Admitting: Internal Medicine

## 2011-10-14 ENCOUNTER — Telehealth: Payer: Self-pay

## 2011-10-14 VITALS — BP 132/60 | HR 83 | Temp 97.5°F | Ht 63.0 in | Wt 146.0 lb

## 2011-10-14 DIAGNOSIS — N8110 Cystocele, unspecified: Secondary | ICD-10-CM

## 2011-10-14 DIAGNOSIS — N952 Postmenopausal atrophic vaginitis: Secondary | ICD-10-CM

## 2011-10-14 MED ORDER — ESTROGENS, CONJUGATED 0.625 MG/GM VA CREA: TOPICAL_CREAM | Freq: Every day | VAGINAL | Status: DC

## 2011-10-14 MED ORDER — AMOXICILLIN-POT CLAVULANATE 500-125 MG PO TABS: 1.0000 | ORAL_TABLET | Freq: Three times a day (TID) | ORAL | Status: AC

## 2011-10-14 NOTE — Progress Notes (Signed)
  Subjective:    Patient ID: Linda [name], female    DOB: 10-Sep-1940, 71 y.o.   MRN: [id number]  HPI  complains of "pressure" and "bulging" in vaginal area Onset over past few weeks - Denies trauma or urinary/bowel incontinence No bleeding   Past Medical History  Diagnosis Date  . TOBACCO USE, QUIT   . COLONIC POLYPS, HX OF 2007  . ALLERGIC RHINITIS   . ANEMIA-IRON DEFICIENCY   . Anxiety state, unspecified   . OSTEOARTHRITIS   . GERD   . GLAUCOMA   . Carpal tunnel syndrome   . HIATAL HERNIA WITH REFLUX   . GOITER, MULTINODULAR     Review of Systems  Constitutional: Negative for fever and unexpected weight change.  Genitourinary: Negative for frequency, hematuria, flank pain, vaginal bleeding, vaginal discharge, vaginal pain and pelvic pain.       Objective:   Physical Exam BP 132/60  Pulse 83  Temp(Src) 97.5 F (36.4 C) (Oral)  Ht 5\' 3"  (1.6 m)  Wt 146 lb (66.225 kg)  BMI 25.86 kg/m2  SpO2 96% Wt Readings from Last 3 Encounters:  10/14/11 146 lb (66.225 kg)  07/12/11 144 lb 8 oz (65.545 kg)  01/09/11 144 lb 3.2 oz (65.409 kg)   Gen: NAD Lungs: CTA B CV: RRR GU: defer  Lab Results  Component Value Date   WBC 6.0 07/12/2011   HGB 13.1 07/12/2011   HCT 38.9 07/12/2011   PLT 220.0 07/12/2011   GLUCOSE 89 02/27/2010   CHOL 188 05/29/2010   TRIG 68.0 05/29/2010   HDL 74.40 05/29/2010   LDLCALC 100* 05/29/2010   ALT 21 07/12/2011   AST 26 07/12/2011   NA 141 02/27/2010   K 4.8 02/27/2010   CL 107 02/27/2010   CREATININE 0.7 02/27/2010   BUN 17 02/27/2010   CO2 32 02/27/2010   TSH 1.17 02/27/2010      Assessment & Plan:  Vaginal dryness, postmenopausal atropy - But describes "prolapse" pressure-   Start premarin cream now - new erx done Refer to gyn for pelvic eval and tx

## 2011-10-14 NOTE — Telephone Encounter (Signed)
Noted - new rx done - thanks

## 2011-10-14 NOTE — Patient Instructions (Signed)
It was good to see you today. Premarin cream - Your prescription(s) have been submitted to your pharmacy. Please take as directed and contact our office if you believe you are having problem(s) with the medication(s). Also we'll make referral to gynecology . Our office will contact you regarding appointment(s) once made.

## 2011-10-14 NOTE — Telephone Encounter (Signed)
Pt called stating she was told to call back and advised on a previously Rx'd ABX - Amox Clav 500-125 mg 1 po bid

## 2011-10-15 ENCOUNTER — Telehealth: Payer: Self-pay | Admitting: *Deleted

## 2011-10-15 NOTE — Telephone Encounter (Signed)
Pt informed rx sent to pharmacy.

## 2011-10-15 NOTE — Telephone Encounter (Signed)
Clarify - "small amount on tip of finger" daily

## 2011-10-15 NOTE — Telephone Encounter (Signed)
Received script for premarin cream need to know directions/ how much patient is using before insurance will approve.... 10/15/11@4 :38pm/LMB

## 2011-10-16 NOTE — Telephone Encounter (Signed)
Notified pharmacy spoke with Olegario Messier gave md response... 10/16/11@10 :40am/LMB

## 2011-10-17 DIAGNOSIS — N816 Rectocele: Secondary | ICD-10-CM | POA: Diagnosis not present

## 2011-10-17 DIAGNOSIS — Z01419 Encounter for gynecological examination (general) (routine) without abnormal findings: Secondary | ICD-10-CM | POA: Diagnosis not present

## 2011-10-17 DIAGNOSIS — Z124 Encounter for screening for malignant neoplasm of cervix: Secondary | ICD-10-CM | POA: Diagnosis not present

## 2011-10-17 DIAGNOSIS — N8111 Cystocele, midline: Secondary | ICD-10-CM | POA: Diagnosis not present

## 2011-11-18 DIAGNOSIS — H2510 Age-related nuclear cataract, unspecified eye: Secondary | ICD-10-CM | POA: Diagnosis not present

## 2011-11-18 DIAGNOSIS — H4011X0 Primary open-angle glaucoma, stage unspecified: Secondary | ICD-10-CM | POA: Diagnosis not present

## 2011-11-18 DIAGNOSIS — H409 Unspecified glaucoma: Secondary | ICD-10-CM | POA: Diagnosis not present

## 2011-12-17 ENCOUNTER — Other Ambulatory Visit: Payer: Self-pay | Admitting: *Deleted

## 2011-12-17 MED ORDER — SERTRALINE HCL 25 MG PO TABS: 25.0000 mg | ORAL_TABLET | Freq: Every day | ORAL | Status: DC

## 2012-01-09 ENCOUNTER — Ambulatory Visit (INDEPENDENT_AMBULATORY_CARE_PROVIDER_SITE_OTHER): Payer: Medicare Other | Admitting: Internal Medicine

## 2012-01-09 ENCOUNTER — Encounter: Payer: Self-pay | Admitting: Internal Medicine

## 2012-01-09 VITALS — BP 120/62 | HR 76 | Temp 97.7°F | Ht 65.0 in | Wt 144.4 lb

## 2012-01-09 DIAGNOSIS — N8110 Cystocele, unspecified: Secondary | ICD-10-CM | POA: Insufficient documentation

## 2012-01-09 DIAGNOSIS — E042 Nontoxic multinodular goiter: Secondary | ICD-10-CM

## 2012-01-09 DIAGNOSIS — J309 Allergic rhinitis, unspecified: Secondary | ICD-10-CM | POA: Diagnosis not present

## 2012-01-09 DIAGNOSIS — F411 Generalized anxiety disorder: Secondary | ICD-10-CM

## 2012-01-09 DIAGNOSIS — N8111 Cystocele, midline: Secondary | ICD-10-CM | POA: Diagnosis not present

## 2012-01-09 MED ORDER — OMEPRAZOLE 20 MG PO TBEC: 1.0000 | DELAYED_RELEASE_TABLET | Freq: Every day | ORAL | Status: DC

## 2012-01-09 NOTE — Patient Instructions (Addendum)
It was good to see you today. Medications reviewed, no changes at this time. we'll make referral for ultrasound to recheck thyroid nodules. Our office will contact you regarding appointment(s) once made. Let us know if you would like urology evaluation for your bladder symptoms  - will make referral as needed Please schedule followup in 6 months, call sooner if problems. Come in fasting for physical labs like cholesterol

## 2012-01-09 NOTE — Assessment & Plan Note (Signed)
Started sertraline 05/2010- continued spouse stressors (retirement, depression) but overall improved Cont same - support/counseling provided 

## 2012-01-09 NOTE — Assessment & Plan Note (Signed)
eval by gyn Seymour Bars) for same spring 2013 - no surgery proposed recommended continue Premarin cream and follow up gyn (or uro) as needed

## 2012-01-09 NOTE — Progress Notes (Signed)
  Subjective:    Patient ID: Linda [name], female    DOB: May 30, 1941, 71 y.o.   MRN: [id number]  HPI  Here for follow up   Past Medical History  Diagnosis Date  . TOBACCO USE, QUIT   . COLONIC POLYPS, HX OF 2007  . ALLERGIC RHINITIS   . ANEMIA-IRON DEFICIENCY   . Anxiety state, unspecified   . OSTEOARTHRITIS   . GERD   . GLAUCOMA   . Carpal tunnel syndrome   . HIATAL HERNIA WITH REFLUX   . GOITER, MULTINODULAR     Review of Systems  Constitutional: Negative for fever and unexpected weight change.  HENT: Positive for rhinorrhea and postnasal drip.   Genitourinary: Negative for frequency, hematuria, flank pain, vaginal bleeding, vaginal discharge, vaginal pain and pelvic pain.       Objective:   Physical Exam  BP 120/62  Pulse 76  Temp(Src) 97.7 F (36.5 C) (Oral)  Ht 5\' 5"  (1.651 m)  Wt 144 lb 6.4 oz (65.499 kg)  BMI 24.03 kg/m2  SpO2 97% Wt Readings from Last 3 Encounters:  01/09/12 144 lb 6.4 oz (65.499 kg)  10/14/11 146 lb (66.225 kg)  07/12/11 144 lb 8 oz (65.545 kg)   Gen: NAD Neck: nontender, mild goiter Lungs: CTA B CV: RRR, no edema  Lab Results  Component Value Date   WBC 6.0 07/12/2011   HGB 13.1 07/12/2011   HCT 38.9 07/12/2011   PLT 220.0 07/12/2011   GLUCOSE 89 02/27/2010   CHOL 188 05/29/2010   TRIG 68.0 05/29/2010   HDL 74.40 05/29/2010   LDLCALC 100* 05/29/2010   ALT 21 07/12/2011   AST 26 07/12/2011   NA 141 02/27/2010   K 4.8 02/27/2010   CL 107 02/27/2010   CREATININE 0.7 02/27/2010   BUN 17 02/27/2010   CO2 32 02/27/2010   TSH 1.17 02/27/2010      Assessment & Plan:  See problem list. Medications and labs reviewed today.

## 2012-01-09 NOTE — Assessment & Plan Note (Signed)
OTC allegra - contributing to periodic hoarseness symptoms (s/p ENT eval 09/2010 reviewed) The current medical regimen is effective;  continue present plan and medications.  Reminded of importance of PPI for silent GERD

## 2012-01-09 NOTE — Assessment & Plan Note (Signed)
[place] 12/2004 reviewed: Small nodules are again noted in both the right and left lobe of the thyroid. The previously biopsied mass in the lower pole on the right is unchanged measuring 7 x 7 mm and remains complex. Follow-up ultrasound would be suggested in one year.   Will schedule follow up now as no other provider following same

## 2012-01-14 ENCOUNTER — Ambulatory Visit
Admission: RE | Admit: 2012-01-14 | Discharge: 2012-01-14 | Disposition: A | Discharge status: Home / Self Care | Payer: Medicare Other | Source: Ambulatory Visit | Attending: Internal Medicine | Admitting: Internal Medicine

## 2012-01-14 DIAGNOSIS — E042 Nontoxic multinodular goiter: Secondary | ICD-10-CM

## 2012-01-23 ENCOUNTER — Telehealth: Payer: Self-pay | Admitting: *Deleted

## 2012-01-23 NOTE — Telephone Encounter (Signed)
Left msg on vm requesting to increase her sertraline due to stress issues. Just saw md and she know her situation. Called pt bck no answer LMOM md is out of office will return on Monday will hold msg until then, if need to increase now will need ov with another md to consider.Marland KitchenMarland KitchenMarland Kitchen6/13/13@1 :36pm/LMB

## 2012-01-24 MED ORDER — SERTRALINE HCL 25 MG PO TABS: 50.0000 mg | ORAL_TABLET | Freq: Every day | ORAL | Status: DC

## 2012-01-27 NOTE — Telephone Encounter (Signed)
Notified pt dr. Jonny Ruiz ok to increase to twice a day. He sent med to [facility]... 01/27/12@3 :29pm/LMB

## 2012-01-28 ENCOUNTER — Encounter: Payer: Self-pay | Admitting: Internal Medicine

## 2012-01-28 DIAGNOSIS — Z1231 Encounter for screening mammogram for malignant neoplasm of breast: Secondary | ICD-10-CM | POA: Diagnosis not present

## 2012-01-28 LAB — HM MAMMOGRAPHY: HM Mammogram: NEGATIVE

## 2012-05-19 DIAGNOSIS — H4011X0 Primary open-angle glaucoma, stage unspecified: Secondary | ICD-10-CM | POA: Diagnosis not present

## 2012-05-19 DIAGNOSIS — H25019 Cortical age-related cataract, unspecified eye: Secondary | ICD-10-CM | POA: Diagnosis not present

## 2012-05-19 DIAGNOSIS — H409 Unspecified glaucoma: Secondary | ICD-10-CM | POA: Diagnosis not present

## 2012-05-27 DIAGNOSIS — H02409 Unspecified ptosis of unspecified eyelid: Secondary | ICD-10-CM | POA: Diagnosis not present

## 2012-05-27 DIAGNOSIS — H259 Unspecified age-related cataract: Secondary | ICD-10-CM | POA: Diagnosis not present

## 2012-05-27 DIAGNOSIS — H409 Unspecified glaucoma: Secondary | ICD-10-CM | POA: Diagnosis not present

## 2012-05-27 DIAGNOSIS — H4011X0 Primary open-angle glaucoma, stage unspecified: Secondary | ICD-10-CM | POA: Diagnosis not present

## 2012-06-09 DIAGNOSIS — Z23 Encounter for immunization: Secondary | ICD-10-CM | POA: Diagnosis not present

## 2012-06-23 DIAGNOSIS — H269 Unspecified cataract: Secondary | ICD-10-CM | POA: Diagnosis not present

## 2012-06-23 DIAGNOSIS — H25049 Posterior subcapsular polar age-related cataract, unspecified eye: Secondary | ICD-10-CM | POA: Diagnosis not present

## 2012-06-23 DIAGNOSIS — H25019 Cortical age-related cataract, unspecified eye: Secondary | ICD-10-CM | POA: Diagnosis not present

## 2012-06-23 DIAGNOSIS — H52209 Unspecified astigmatism, unspecified eye: Secondary | ICD-10-CM | POA: Diagnosis not present

## 2012-06-23 DIAGNOSIS — H409 Unspecified glaucoma: Secondary | ICD-10-CM | POA: Diagnosis not present

## 2012-06-23 DIAGNOSIS — H2510 Age-related nuclear cataract, unspecified eye: Secondary | ICD-10-CM | POA: Diagnosis not present

## 2012-07-03 ENCOUNTER — Ambulatory Visit (INDEPENDENT_AMBULATORY_CARE_PROVIDER_SITE_OTHER): Payer: Medicare Other | Admitting: Internal Medicine

## 2012-07-03 ENCOUNTER — Encounter: Payer: Self-pay | Admitting: Internal Medicine

## 2012-07-03 ENCOUNTER — Other Ambulatory Visit (INDEPENDENT_AMBULATORY_CARE_PROVIDER_SITE_OTHER): Payer: Medicare Other

## 2012-07-03 VITALS — BP 130/70 | HR 71 | Temp 97.4°F | Ht 65.0 in | Wt 142.8 lb

## 2012-07-03 DIAGNOSIS — Z0000 Encounter for general adult medical examination without abnormal findings: Secondary | ICD-10-CM | POA: Diagnosis not present

## 2012-07-03 DIAGNOSIS — R5381 Other malaise: Secondary | ICD-10-CM

## 2012-07-03 DIAGNOSIS — R5383 Other fatigue: Secondary | ICD-10-CM | POA: Diagnosis not present

## 2012-07-03 DIAGNOSIS — Z136 Encounter for screening for cardiovascular disorders: Secondary | ICD-10-CM

## 2012-07-03 DIAGNOSIS — Z780 Asymptomatic menopausal state: Secondary | ICD-10-CM | POA: Diagnosis not present

## 2012-07-03 DIAGNOSIS — M810 Age-related osteoporosis without current pathological fracture: Secondary | ICD-10-CM | POA: Diagnosis not present

## 2012-07-03 LAB — TSH: TSH: 1.2 u[IU]/mL (ref 0.35–5.50)

## 2012-07-03 LAB — CBC WITH DIFFERENTIAL/PLATELET
Basophils Absolute: 0 10*3/uL (ref 0.0–0.1)
Eosinophils Relative: 5.5 % — ABNORMAL HIGH (ref 0.0–5.0)
Lymphocytes Relative: 41.3 % (ref 12.0–46.0)
Lymphs Abs: 2.6 10*3/uL (ref 0.7–4.0)
Monocytes Relative: 7.8 % (ref 3.0–12.0)
Neutrophils Relative %: 44.9 % (ref 43.0–77.0)
Platelets: 232 10*3/uL (ref 150.0–400.0)
RDW: 19.8 % — ABNORMAL HIGH (ref 11.5–14.6)
WBC: 6.3 10*3/uL (ref 4.5–10.5)

## 2012-07-03 LAB — BASIC METABOLIC PANEL
BUN: 17 mg/dL (ref 6–23)
Calcium: 9.1 mg/dL (ref 8.4–10.5)
GFR: 91.99 mL/min (ref >60.00)
Glucose, Bld: 100 mg/dL — ABNORMAL HIGH (ref 70–99)
Potassium: 4.6 mEq/L (ref 3.5–5.1)

## 2012-07-03 LAB — HEPATIC FUNCTION PANEL
ALT: 19 U/L (ref 0–35)
AST: 25 U/L (ref 0–37)
Bilirubin, Direct: 0.1 mg/dL (ref 0.0–0.3)
Total Bilirubin: 0.6 mg/dL (ref 0.3–1.2)
Total Protein: 7.1 g/dL (ref 6.0–8.3)

## 2012-07-03 LAB — LIPID PANEL
Cholesterol: 190 mg/dL (ref 0–200)
HDL: 72.1 mg/dL (ref >39.00)
VLDL: 22 mg/dL (ref 0.0–40.0)

## 2012-07-03 NOTE — Patient Instructions (Addendum)
It was good to see you today. We have reviewed your prior records including labs and tests today Health Maintenance reviewed - all recommended immunizations and age-appropriate screenings are up-to-date. Test(s) ordered today. Your results will be released to MyChart (or called to you) after review, usually within 72hours after test completion. If any changes need to be made, you will be notified at that same time. we'll make referral to [facility] for bone density. Our office will contact you regarding appointment(s) once made. Medications reviewed and updated, no changes recommended at this time. Please schedule followup in 6 months, call sooner if problems. Health Maintenance, Females A healthy lifestyle and preventative care can promote health and wellness.  Maintain regular health, dental, and eye exams.   Eat a healthy diet. Foods like vegetables, fruits, whole grains, low-fat dairy products, and lean protein foods contain the nutrients you need without too many calories. Decrease your intake of foods high in solid fats, added sugars, and salt. Get information about a proper diet from your caregiver, if necessary.   Regular physical exercise is one of the most important things you can do for your health. Most adults should get at least 150 minutes of moderate-intensity exercise (any activity that increases your heart rate and causes you to sweat) each week. In addition, most adults need muscle-strengthening exercises on 2 or more days a week.     Maintain a healthy weight. The body mass index (BMI) is a screening tool to identify possible weight problems. It provides an estimate of body fat based on height and weight. Your caregiver can help determine your BMI, and can help you achieve or maintain a healthy weight. For adults 20 years and older:   A BMI below 18.5 is considered underweight.   A BMI of 18.5 to 24.9 is normal.   A BMI of 25 to 29.9 is considered overweight.   A BMI of 30 and  above is considered obese.   Maintain normal blood lipids and cholesterol by exercising and minimizing your intake of saturated fat. Eat a balanced diet with plenty of fruits and vegetables. Blood tests for lipids and cholesterol should begin at age 71 and be repeated every 5 years. If your lipid or cholesterol levels are high, you are over 50, or you are a high risk for heart disease, you may need your cholesterol levels checked more frequently. Ongoing high lipid and cholesterol levels should be treated with medicines if diet and exercise are not effective.   If you smoke, find out from your caregiver how to quit. If you do not use tobacco, do not start.   If you are pregnant, do not drink alcohol. If you are breastfeeding, be very cautious about drinking alcohol. If you are not pregnant and choose to drink alcohol, do not exceed 1 drink per day. One drink is considered to be 12 ounces (355 mL) of beer, 5 ounces (148 mL) of wine, or 1.5 ounces (44 mL) of liquor.   Avoid use of street drugs. Do not share needles with anyone. Ask for help if you need support or instructions about stopping the use of drugs.   High blood pressure causes heart disease and increases the risk of stroke. Blood pressure should be checked at least every 1 to 2 years. Ongoing high blood pressure should be treated with medicines, if weight loss and exercise are not effective.   If you are 75 to 71 years old, ask your caregiver if you should take aspirin to  prevent strokes.   Diabetes screening involves taking a blood sample to check your fasting blood sugar level. This should be done once every 3 years, after age 36, if you are within normal weight and without risk factors for diabetes. Testing should be considered at a younger age or be carried out more frequently if you are overweight and have at least 1 risk factor for diabetes.   Breast cancer screening is essential preventative care for women. You should practice "breast  self-awareness." This means understanding the normal appearance and feel of your breasts and may include breast self-examination. Any changes detected, no matter how small, should be reported to a caregiver. Women in their 81s and 30s should have a clinical breast exam (CBE) by a caregiver as part of a regular health exam every 1 to 3 years. After age 50, women should have a CBE every year. Starting at age 75, women should consider having a mammogram (breast X-ray) every year. Women who have a family history of breast cancer should talk to their caregiver about genetic screening. Women at a high risk of breast cancer should talk to their caregiver about having an MRI and a mammogram every year.   The Pap test is a screening test for cervical cancer. Women should have a Pap test starting at age 49. Between ages 26 and 48, Pap tests should be repeated every 2 years. Beginning at age 29, you should have a Pap test every 3 years as long as the past 3 Pap tests have been normal. If you had a hysterectomy for a problem that was not cancer or a condition that could lead to cancer, then you no longer need Pap tests. If you are between ages 66 and 69, and you have had normal Pap tests going back 10 years, you no longer need Pap tests. If you have had past treatment for cervical cancer or a condition that could lead to cancer, you need Pap tests and screening for cancer for at least 20 years after your treatment. If Pap tests have been discontinued, risk factors (such as a new sexual partner) need to be reassessed to determine if screening should be resumed. Some women have medical problems that increase the chance of getting cervical cancer. In these cases, your caregiver may recommend more frequent screening and Pap tests.   The human papillomavirus (HPV) test is an additional test that may be used for cervical cancer screening. The HPV test looks for the virus that can cause the cell changes on the cervix. The cells  collected during the Pap test can be tested for HPV. The HPV test could be used to screen women aged 39 years and older, and should be used in women of any age who have unclear Pap test results. After the age of 45, women should have HPV testing at the same frequency as a Pap test.   Colorectal cancer can be detected and often prevented. Most routine colorectal cancer screening begins at the age of 26 and continues through age 30. However, your caregiver may recommend screening at an earlier age if you have risk factors for colon cancer. On a yearly basis, your caregiver may provide home test kits to check for hidden blood in the stool. Use of a small camera at the end of a tube, to directly examine the colon (sigmoidoscopy or colonoscopy), can detect the earliest forms of colorectal cancer. Talk to your caregiver about this at age 73, when routine screening begins. Direct examination  of the colon should be repeated every 5 to 10 years through age 71, unless early forms of pre-cancerous polyps or small growths are found.   Hepatitis C blood testing is recommended for all people born from [birth year] through 1965 and any individual with known risks for hepatitis C.   Practice safe sex. Use condoms and avoid high-risk sexual practices to reduce the spread of sexually transmitted infections (STIs). Sexually active women aged 11 and younger should be checked for Chlamydia, which is a common sexually transmitted infection. Older women with new or multiple partners should also be tested for Chlamydia. Testing for other STIs is recommended if you are sexually active and at increased risk.   Osteoporosis is a disease in which the bones lose minerals and strength with aging. This can result in serious bone fractures. The risk of osteoporosis can be identified using a bone density scan. Women ages 71 and over and women at risk for fractures or osteoporosis should discuss screening with their caregivers. Ask your caregiver  whether you should be taking a calcium supplement or vitamin D to reduce the rate of osteoporosis.   Menopause can be associated with physical symptoms and risks. Hormone replacement therapy is available to decrease symptoms and risks. You should talk to your caregiver about whether hormone replacement therapy is right for you.   Use sunscreen with a sun protection factor (SPF) of 30 or greater. Apply sunscreen liberally and repeatedly throughout the day. You should seek shade when your shadow is shorter than you. Protect yourself by wearing long sleeves, pants, a wide-brimmed hat, and sunglasses year round, whenever you are outdoors.   Notify your caregiver of new moles or changes in moles, especially if there is a change in shape or color. Also notify your caregiver if a mole is larger than the size of a pencil eraser.   Stay current with your immunizations.  Document Released: 02/11/2011 Document Revised: 10/21/2011 Document Reviewed: 02/11/2011 [facility] Patient Information 2013 [place], [place].

## 2012-07-03 NOTE — Progress Notes (Signed)
Subjective:    Patient ID: Linda [name], female    DOB: 1941-06-10, 71 y.o.   MRN: [id number]  HPI   Here for medicare wellness  Diet: heart healthy  Physical activity: active Depression/mood screen: negative Hearing: intact to whispered voice Visual acuity: grossly normal, performs annual eye exam  ADLs: capable Fall risk: none Home safety: good Cognitive evaluation: intact to orientation, naming, recall and repetition EOL planning: adv directives, full code/ I agree  I have personally reviewed and have noted 1. The patient's medical and social history 2. Their use of alcohol, tobacco or illicit drugs 3. Their current medications and supplements 4. The patient's functional ability including ADL's, fall risks, home safety risks and hearing or visual impairment. 5. Diet and physical activities 6. Evidence for depression or mood disorders   Past Medical History  Diagnosis Date  . TOBACCO USE, QUIT   . COLONIC POLYPS, HX OF 2007  . ALLERGIC RHINITIS   . ANEMIA-IRON DEFICIENCY   . Anxiety state, unspecified   . OSTEOARTHRITIS   . GERD   . GLAUCOMA   . Carpal tunnel syndrome   . HIATAL HERNIA WITH REFLUX   . GOITER, MULTINODULAR    Family History  Problem Relation Age of Onset  . Diabetes Mother   . Heart disease Mother   . Arthritis Mother   . Hypertension Mother   . Arthritis Father   . Colon cancer Other     grandparent not sure which 1   History  Substance Use Topics  . Smoking status: Former Smoker    Quit date: 08/12/1988  . 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 Alcohol Use: Yes    Review of Systems  Constitutional: Positive for fatigue. Negative for fever and unexpected weight change.  HENT: Positive for rhinorrhea and postnasal drip.   Genitourinary: Negative for frequency, hematuria, flank pain, vaginal bleeding, vaginal discharge, vaginal pain and pelvic pain.  No other specific complaints in  a complete review of systems (except as listed in HPI above).      Objective:   Physical Exam  BP 130/70  Pulse 71  Temp 97.4 F (36.3 C) (Oral)  Ht 5\' 5"  (1.651 m)  Wt 142 lb 12.8 oz (64.774 kg)  BMI 23.76 kg/m2  SpO2 95% Wt Readings from Last 3 Encounters:  07/03/12 142 lb 12.8 oz (64.774 kg)  01/09/12 144 lb 6.4 oz (65.499 kg)  10/14/11 146 lb (66.225 kg)   Constitutional: She appears well-developed and well-nourished. No distress.  HENT: Head: Normocephalic and atraumatic. Ears: B TMs ok, no erythema or effusion; Nose: Nose normal. Mouth/Throat: Oropharynx is clear and moist. No oropharyngeal exudate.  Eyes: Conjunctivae and EOM are normal. Pupils are equal, round, and reactive to light. No scleral icterus.  Neck: Normal range of motion. Neck supple. No JVD present. No thyromegaly present.  Cardiovascular: Normal rate, regular rhythm and normal heart sounds.  No murmur heard. No BLE edema. Pulmonary/Chest: Effort normal and breath sounds normal. No respiratory distress. She has no wheezes.  Neurological: She is alert and oriented to person, place, and time. No cranial nerve deficit. Coordination normal.  Skin: Skin is warm and dry. No rash noted. No erythema.  Psychiatric: She has a normal mood and affect. Her behavior is normal. Judgment and thought content normal.    Lab Results  Component Value Date   WBC 6.0 07/12/2011   HGB 13.1 07/12/2011   HCT  38.9 07/12/2011   PLT 220.0 07/12/2011   GLUCOSE 89 02/27/2010   CHOL 188 05/29/2010   TRIG 68.0 05/29/2010   HDL 74.40 05/29/2010   LDLCALC 100* 05/29/2010   ALT 21 07/12/2011   AST 26 07/12/2011   NA 141 02/27/2010   K 4.8 02/27/2010   CL 107 02/27/2010   CREATININE 0.7 02/27/2010   BUN 17 02/27/2010   CO2 32 02/27/2010   TSH 1.17 02/27/2010      Assessment & Plan:   AWV/v70.0 - Today patient counseled on age appropriate routine health concerns for screening and prevention, each reviewed and up to date or declined.  Immunizations reviewed and up to date or declined. Labs ordered and reviewed. Risk factors for depression reviewed and negative. Hearing function and visual acuity are intact. ADLs screened and addressed as needed. Functional ability and level of safety reviewed and appropriate. Education, counseling and referrals performed based on assessed risks today. Patient provided with a copy of personalized plan for preventive services.   Fatigue - nonspecific symptoms/exam - check screening labs  Also see problem list. Medications and labs reviewed today.

## 2012-07-06 ENCOUNTER — Other Ambulatory Visit: Payer: Self-pay | Admitting: *Deleted

## 2012-07-06 DIAGNOSIS — D509 Iron deficiency anemia, unspecified: Secondary | ICD-10-CM

## 2012-07-30 ENCOUNTER — Other Ambulatory Visit: Payer: Self-pay | Admitting: *Deleted

## 2012-07-30 MED ORDER — OMEPRAZOLE 20 MG PO TBEC: 1.0000 | DELAYED_RELEASE_TABLET | Freq: Every day | ORAL | Status: DC

## 2012-08-07 ENCOUNTER — Other Ambulatory Visit (INDEPENDENT_AMBULATORY_CARE_PROVIDER_SITE_OTHER): Payer: Medicare Other

## 2012-08-07 DIAGNOSIS — D509 Iron deficiency anemia, unspecified: Secondary | ICD-10-CM

## 2012-08-07 LAB — CBC WITH DIFFERENTIAL/PLATELET
Basophils Relative: 0.8 % (ref 0.0–3.0)
Eosinophils Relative: 6.1 % — ABNORMAL HIGH (ref 0.0–5.0)
HCT: 35.4 % — ABNORMAL LOW (ref 36.0–46.0)
Hemoglobin: 11.5 g/dL — ABNORMAL LOW (ref 12.0–15.0)
Lymphs Abs: 3 10*3/uL (ref 0.7–4.0)
MCV: 80.8 fl (ref 78.0–100.0)
Monocytes Relative: 8.2 % (ref 3.0–12.0)
Platelets: 253 10*3/uL (ref 150.0–400.0)
RBC: 4.38 Mil/uL (ref 3.87–5.11)
WBC: 8.5 10*3/uL (ref 4.5–10.5)

## 2012-08-12 HISTORY — PX: EYE SURGERY: SHX253

## 2012-08-25 ENCOUNTER — Other Ambulatory Visit: Payer: Self-pay | Admitting: *Deleted

## 2012-08-25 MED ORDER — SERTRALINE HCL 25 MG PO TABS: 50.0000 mg | ORAL_TABLET | Freq: Every day | ORAL | Status: DC

## 2012-09-01 DIAGNOSIS — H4011X0 Primary open-angle glaucoma, stage unspecified: Secondary | ICD-10-CM | POA: Diagnosis not present

## 2012-09-08 DIAGNOSIS — H4011X0 Primary open-angle glaucoma, stage unspecified: Secondary | ICD-10-CM | POA: Diagnosis not present

## 2012-09-08 DIAGNOSIS — H409 Unspecified glaucoma: Secondary | ICD-10-CM | POA: Diagnosis not present

## 2012-09-18 DIAGNOSIS — H409 Unspecified glaucoma: Secondary | ICD-10-CM | POA: Diagnosis not present

## 2012-09-18 DIAGNOSIS — H4011X0 Primary open-angle glaucoma, stage unspecified: Secondary | ICD-10-CM | POA: Diagnosis not present

## 2012-10-01 DIAGNOSIS — H4011X0 Primary open-angle glaucoma, stage unspecified: Secondary | ICD-10-CM | POA: Diagnosis not present

## 2012-10-15 DIAGNOSIS — H4011X0 Primary open-angle glaucoma, stage unspecified: Secondary | ICD-10-CM | POA: Diagnosis not present

## 2012-10-20 DIAGNOSIS — H4011X0 Primary open-angle glaucoma, stage unspecified: Secondary | ICD-10-CM | POA: Diagnosis not present

## 2012-10-28 DIAGNOSIS — IMO0002 Reserved for concepts with insufficient information to code with codable children: Secondary | ICD-10-CM | POA: Insufficient documentation

## 2012-10-28 DIAGNOSIS — H4011X0 Primary open-angle glaucoma, stage unspecified: Secondary | ICD-10-CM | POA: Diagnosis not present

## 2012-10-28 DIAGNOSIS — H02409 Unspecified ptosis of unspecified eyelid: Secondary | ICD-10-CM | POA: Diagnosis not present

## 2012-10-28 DIAGNOSIS — H409 Unspecified glaucoma: Secondary | ICD-10-CM | POA: Diagnosis not present

## 2012-10-28 DIAGNOSIS — H2510 Age-related nuclear cataract, unspecified eye: Secondary | ICD-10-CM | POA: Diagnosis not present

## 2012-10-28 DIAGNOSIS — H401134 Primary open-angle glaucoma, bilateral, indeterminate stage: Secondary | ICD-10-CM | POA: Insufficient documentation

## 2012-11-06 DIAGNOSIS — H4011X0 Primary open-angle glaucoma, stage unspecified: Secondary | ICD-10-CM | POA: Diagnosis not present

## 2012-11-06 DIAGNOSIS — D649 Anemia, unspecified: Secondary | ICD-10-CM | POA: Insufficient documentation

## 2012-11-06 DIAGNOSIS — F32A Depression, unspecified: Secondary | ICD-10-CM | POA: Insufficient documentation

## 2012-11-06 DIAGNOSIS — Z01810 Encounter for preprocedural cardiovascular examination: Secondary | ICD-10-CM | POA: Diagnosis not present

## 2012-11-06 DIAGNOSIS — F419 Anxiety disorder, unspecified: Secondary | ICD-10-CM | POA: Insufficient documentation

## 2012-11-06 DIAGNOSIS — H409 Unspecified glaucoma: Secondary | ICD-10-CM | POA: Diagnosis not present

## 2012-11-10 DIAGNOSIS — H268 Other specified cataract: Secondary | ICD-10-CM | POA: Diagnosis not present

## 2012-11-10 DIAGNOSIS — H02409 Unspecified ptosis of unspecified eyelid: Secondary | ICD-10-CM | POA: Diagnosis not present

## 2012-11-10 DIAGNOSIS — D649 Anemia, unspecified: Secondary | ICD-10-CM | POA: Diagnosis not present

## 2012-11-10 DIAGNOSIS — H4011X0 Primary open-angle glaucoma, stage unspecified: Secondary | ICD-10-CM | POA: Diagnosis not present

## 2012-11-10 DIAGNOSIS — K219 Gastro-esophageal reflux disease without esophagitis: Secondary | ICD-10-CM | POA: Diagnosis not present

## 2012-11-10 DIAGNOSIS — H409 Unspecified glaucoma: Secondary | ICD-10-CM | POA: Diagnosis not present

## 2012-11-10 DIAGNOSIS — F411 Generalized anxiety disorder: Secondary | ICD-10-CM | POA: Diagnosis not present

## 2012-11-10 DIAGNOSIS — M129 Arthropathy, unspecified: Secondary | ICD-10-CM | POA: Diagnosis not present

## 2012-11-10 DIAGNOSIS — F3289 Other specified depressive episodes: Secondary | ICD-10-CM | POA: Diagnosis not present

## 2012-11-11 DIAGNOSIS — H4011X0 Primary open-angle glaucoma, stage unspecified: Secondary | ICD-10-CM | POA: Diagnosis not present

## 2012-11-11 DIAGNOSIS — H409 Unspecified glaucoma: Secondary | ICD-10-CM | POA: Diagnosis not present

## 2012-11-11 DIAGNOSIS — H02409 Unspecified ptosis of unspecified eyelid: Secondary | ICD-10-CM | POA: Diagnosis not present

## 2012-11-11 DIAGNOSIS — H2510 Age-related nuclear cataract, unspecified eye: Secondary | ICD-10-CM | POA: Diagnosis not present

## 2012-12-08 ENCOUNTER — Telehealth: Payer: Self-pay | Admitting: *Deleted

## 2012-12-08 MED ORDER — ONDANSETRON 4 MG PO TBDP: ORAL_TABLET | ORAL | Status: DC

## 2012-12-08 MED ORDER — HYOSCYAMINE SULFATE 0.125 MG SL SUBL: 0.1250 mg | SUBLINGUAL_TABLET | SUBLINGUAL | Status: DC | PRN

## 2012-12-08 NOTE — Telephone Encounter (Signed)
Informed pt of meds we ordered and to keep herself well hydrated.

## 2012-12-08 NOTE — Telephone Encounter (Signed)
Zofran 4 mg every 12 hours as needed and also when necessary sublingual Levsin

## 2012-12-08 NOTE — Telephone Encounter (Signed)
Pt reports she has some discomfort in the waist area for several weeks, but last night in the middle of the night she started with violent n/v and diarrhea and she has a terrible headache as well. She still has the problem with terrible pain at her waist to the left side. When she tries to drink water the pain hits her and she either vomits or has diarrhea or both. Last COLON 04/30/2004 normal no mention of diverticulosis. Explained to pt she may have the NORO visus since she is affected by both "ends" . Will try to get her something for nausea so she can keep herself hydrated. Please advise. Thanks.

## 2012-12-11 ENCOUNTER — Other Ambulatory Visit: Payer: Self-pay | Admitting: *Deleted

## 2012-12-11 MED ORDER — SERTRALINE HCL 25 MG PO TABS: 50.0000 mg | ORAL_TABLET | Freq: Every day | ORAL | Status: DC

## 2013-01-28 DIAGNOSIS — Z1231 Encounter for screening mammogram for malignant neoplasm of breast: Secondary | ICD-10-CM | POA: Diagnosis not present

## 2013-02-05 ENCOUNTER — Ambulatory Visit: Payer: Medicare Other | Admitting: Internal Medicine

## 2013-02-08 ENCOUNTER — Ambulatory Visit (INDEPENDENT_AMBULATORY_CARE_PROVIDER_SITE_OTHER): Payer: Medicare Other | Admitting: Internal Medicine

## 2013-02-08 ENCOUNTER — Encounter: Payer: Self-pay | Admitting: Internal Medicine

## 2013-02-08 VITALS — BP 132/78 | HR 63 | Temp 98.0°F | Wt 140.2 lb

## 2013-02-08 DIAGNOSIS — R5383 Other fatigue: Secondary | ICD-10-CM | POA: Diagnosis not present

## 2013-02-08 DIAGNOSIS — R5381 Other malaise: Secondary | ICD-10-CM

## 2013-02-08 DIAGNOSIS — D509 Iron deficiency anemia, unspecified: Secondary | ICD-10-CM | POA: Diagnosis not present

## 2013-02-08 MED ORDER — SERTRALINE HCL 50 MG PO TABS: 50.0000 mg | ORAL_TABLET | Freq: Every day | ORAL | Status: DC

## 2013-02-08 MED ORDER — GABAPENTIN 100 MG PO CAPS: 300.0000 mg | ORAL_CAPSULE | Freq: Every day | ORAL | Status: DC

## 2013-02-08 NOTE — Progress Notes (Signed)
  Subjective:    Patient ID: Linda [name], female    DOB: 04/23/1941, 72 y.o.   MRN: [id number]  HPI  Here for follow up - reviewed chronic medical issues and interval medical issues:  Past Medical History  Diagnosis Date  . TOBACCO USE, QUIT   . COLONIC POLYPS, HX OF 2007  . ALLERGIC RHINITIS   . ANEMIA-IRON DEFICIENCY   . Anxiety state, unspecified   . OSTEOARTHRITIS   . GERD   . GLAUCOMA   . Carpal tunnel syndrome   . HIATAL HERNIA WITH REFLUX   . GOITER, MULTINODULAR     Review of Systems  Constitutional: Positive for fatigue. Negative for fever and unexpected weight change.  Genitourinary: Negative for frequency, hematuria, flank pain, vaginal bleeding, vaginal discharge, vaginal pain and pelvic pain.       Objective:   Physical Exam  BP 132/78  Pulse 63  Temp(Src) 98 F (36.7 C) (Oral)  Wt 140 lb 3.2 oz (63.594 kg)  BMI 23.33 kg/m2  SpO2 94% Wt Readings from Last 3 Encounters:  02/08/13 140 lb 3.2 oz (63.594 kg)  07/03/12 142 lb 12.8 oz (64.774 kg)  01/09/12 144 lb 6.4 oz (65.499 kg)   Gen: NAD Neck: nontender, mild goiter Lungs: CTA B CV: RRR, no edema  Lab Results  Component Value Date   WBC 8.5 08/07/2012   HGB 11.5* 08/07/2012   HCT 35.4* 08/07/2012   PLT 253.0 08/07/2012   GLUCOSE 100* 07/03/2012   CHOL 190 07/03/2012   TRIG 110.0 07/03/2012   HDL 72.10 07/03/2012   LDLCALC 96 07/03/2012   ALT 19 07/03/2012   AST 25 07/03/2012   NA 137 07/03/2012   K 4.6 07/03/2012   CL 102 07/03/2012   CREATININE 0.7 07/03/2012   BUN 17 07/03/2012   CO2 29 07/03/2012   TSH 1.20 07/03/2012      Assessment & Plan:  See problem list. Medications and labs reviewed today.  Fatigue - nonspecific symptoms/exam - check screening labs

## 2013-02-08 NOTE — Assessment & Plan Note (Signed)
Long hx same - prior GI eval EGD 2007, colo 2005 unremarkable- On oral iron -recheck levels ferritin, b12 and CBC now -  consider wean off iron due to nausea side effects if normalized  Lab Results  Component Value Date   WBC 8.5 08/07/2012   HGB 11.5* 08/07/2012   HCT 35.4* 08/07/2012   MCV 80.8 08/07/2012   PLT 253.0 08/07/2012   Lab Results  Component Value Date   FERRITIN 18.5 07/12/2011

## 2013-02-08 NOTE — Patient Instructions (Signed)
It was good to see you today. Medications reviewed, no changes at this time. Test(s) ordered today. Your results will be released to MyChart (or called to you) after review, usually within 72hours after test completion. If any changes need to be made, you will be notified at that same time. Please schedule followup in 6 months, call sooner if problems. Come in fasting for physical labs like cholesterol

## 2013-02-09 DIAGNOSIS — H409 Unspecified glaucoma: Secondary | ICD-10-CM | POA: Diagnosis not present

## 2013-02-09 DIAGNOSIS — H4011X0 Primary open-angle glaucoma, stage unspecified: Secondary | ICD-10-CM | POA: Diagnosis not present

## 2013-02-16 ENCOUNTER — Other Ambulatory Visit (INDEPENDENT_AMBULATORY_CARE_PROVIDER_SITE_OTHER): Payer: Medicare Other

## 2013-02-16 DIAGNOSIS — R5383 Other fatigue: Secondary | ICD-10-CM | POA: Diagnosis not present

## 2013-02-16 DIAGNOSIS — D509 Iron deficiency anemia, unspecified: Secondary | ICD-10-CM | POA: Diagnosis not present

## 2013-02-16 DIAGNOSIS — R5381 Other malaise: Secondary | ICD-10-CM

## 2013-02-16 LAB — CBC WITH DIFFERENTIAL/PLATELET
Basophils Relative: 0.7 % (ref 0.0–3.0)
Eosinophils Absolute: 0.5 10*3/uL (ref 0.0–0.7)
Eosinophils Relative: 7.2 % — ABNORMAL HIGH (ref 0.0–5.0)
HCT: 40.8 % (ref 36.0–46.0)
Lymphs Abs: 2.8 10*3/uL (ref 0.7–4.0)
MCHC: 33.2 g/dL (ref 30.0–36.0)
MCV: 92.6 fl (ref 78.0–100.0)
Monocytes Absolute: 0.5 10*3/uL (ref 0.1–1.0)
Platelets: 212 10*3/uL (ref 150.0–400.0)
WBC: 6.4 10*3/uL (ref 4.5–10.5)

## 2013-02-16 LAB — FERRITIN: Ferritin: 18.4 ng/mL (ref 10.0–291.0)

## 2013-03-04 ENCOUNTER — Telehealth: Payer: Self-pay | Admitting: *Deleted

## 2013-03-04 MED ORDER — OMEPRAZOLE 20 MG PO TBEC
1.0000 | DELAYED_RELEASE_TABLET | Freq: Every day | ORAL | Status: DC
Start: 2013-03-04 — End: 2013-09-27

## 2013-03-04 NOTE — Telephone Encounter (Signed)
Sent to right source...lmb

## 2013-04-21 DIAGNOSIS — H903 Sensorineural hearing loss, bilateral: Secondary | ICD-10-CM | POA: Diagnosis not present

## 2013-04-22 DIAGNOSIS — M25559 Pain in unspecified hip: Secondary | ICD-10-CM | POA: Diagnosis not present

## 2013-05-04 DIAGNOSIS — H4011X0 Primary open-angle glaucoma, stage unspecified: Secondary | ICD-10-CM | POA: Diagnosis not present

## 2013-05-04 DIAGNOSIS — H409 Unspecified glaucoma: Secondary | ICD-10-CM | POA: Diagnosis not present

## 2013-05-10 DIAGNOSIS — H02409 Unspecified ptosis of unspecified eyelid: Secondary | ICD-10-CM | POA: Diagnosis not present

## 2013-05-10 DIAGNOSIS — H2510 Age-related nuclear cataract, unspecified eye: Secondary | ICD-10-CM | POA: Diagnosis not present

## 2013-05-10 DIAGNOSIS — H409 Unspecified glaucoma: Secondary | ICD-10-CM | POA: Diagnosis not present

## 2013-05-10 DIAGNOSIS — H4011X0 Primary open-angle glaucoma, stage unspecified: Secondary | ICD-10-CM | POA: Diagnosis not present

## 2013-06-14 DIAGNOSIS — M25559 Pain in unspecified hip: Secondary | ICD-10-CM | POA: Diagnosis not present

## 2013-06-22 DIAGNOSIS — Z23 Encounter for immunization: Secondary | ICD-10-CM | POA: Diagnosis not present

## 2013-07-06 DIAGNOSIS — M25559 Pain in unspecified hip: Secondary | ICD-10-CM | POA: Diagnosis not present

## 2013-08-10 ENCOUNTER — Ambulatory Visit (INDEPENDENT_AMBULATORY_CARE_PROVIDER_SITE_OTHER): Payer: Medicare Other | Admitting: Internal Medicine

## 2013-08-10 ENCOUNTER — Encounter: Payer: Self-pay | Admitting: Internal Medicine

## 2013-08-10 VITALS — BP 120/78 | HR 71 | Temp 98.0°F | Wt 139.4 lb

## 2013-08-10 DIAGNOSIS — Z0000 Encounter for general adult medical examination without abnormal findings: Secondary | ICD-10-CM | POA: Diagnosis not present

## 2013-08-10 DIAGNOSIS — Z23 Encounter for immunization: Secondary | ICD-10-CM | POA: Diagnosis not present

## 2013-08-10 NOTE — Progress Notes (Signed)
Subjective:    Patient ID: Linda [name], female    DOB: [birth year], 72 y.o.   MRN: [id number]  HPI  Here for medicare wellness  Diet: heart healthy  Physical activity: active Depression/mood screen: negative Hearing: intact to whispered voice Visual acuity: grossly normal, performs annual eye exam  ADLs: capable Fall risk: none Home safety: good Cognitive evaluation: intact to orientation, naming, recall and repetition EOL planning: adv directives, full code/ I agree  I have personally reviewed and have noted 1. The patient's medical and social history 2. Their use of alcohol, tobacco or illicit drugs 3. Their current medications and supplements 4. The patient's functional ability including ADL's, fall risks, home safety risks and hearing or visual impairment. 5. Diet and physical activities 6. Evidence for depression or mood disorders  Also reviewed chronic medical issues and interval medical events  Past Medical History  Diagnosis Date  . TOBACCO USE, QUIT   . COLONIC POLYPS, HX OF 2007  . ALLERGIC RHINITIS   . ANEMIA-IRON DEFICIENCY   . Anxiety state, unspecified   . OSTEOARTHRITIS   . GERD   . GLAUCOMA   . Carpal tunnel syndrome   . HIATAL HERNIA WITH REFLUX   . GOITER, MULTINODULAR    Family History  Problem Relation Age of Onset  . Diabetes Mother   . Heart disease Mother   . Arthritis Mother   . Hypertension Mother   . Arthritis Father   . Colon cancer Other     grandparent not sure which 1   History  Substance Use Topics  . Smoking status: Former Smoker    Quit date: 08/12/1988  . 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 Alcohol Use: Yes    Review of Systems  Constitutional: Positive for fatigue. Negative for fever and unexpected weight change.  HENT: Positive for postnasal drip and rhinorrhea.   Respiratory: Negative for cough, shortness of breath and wheezing.   Cardiovascular: Negative for  chest pain, palpitations and leg swelling.  Gastrointestinal: Negative for nausea, abdominal pain and diarrhea.  Genitourinary: Negative for frequency, hematuria, flank pain and pelvic pain.  Musculoskeletal:       R groin pain  Neurological: Negative for dizziness, weakness, light-headedness and headaches.  Psychiatric/Behavioral: Negative for dysphoric mood. The patient is not nervous/anxious.   All other systems reviewed and are negative.       Objective:   Physical Exam BP 120/78  Pulse 71  Temp(Src) 98 F (36.7 C) (Oral)  Wt 139 lb 6.4 oz (63.231 kg)  SpO2 97% Wt Readings from Last 3 Encounters:  08/10/13 139 lb 6.4 oz (63.231 kg)  02/08/13 140 lb 3.2 oz (63.594 kg)  07/03/12 142 lb 12.8 oz (64.774 kg)   Constitutional: She appears well-developed and well-nourished. No distress.  HENT: Head: Normocephalic and atraumatic. Ears: B TMs ok, no erythema or effusion; Nose: Nose normal. Mouth/Throat: Oropharynx is clear and moist. No oropharyngeal exudate.  Eyes: Conjunctivae and EOM are normal. Pupils are equal, round, and reactive to light. No scleral icterus.  Neck: Normal range of motion. Neck supple. No JVD present. No thyromegaly present.  Cardiovascular: Normal rate, regular rhythm and normal heart sounds.  No murmur heard. No BLE edema. Pulmonary/Chest: Effort normal and breath sounds normal. No respiratory distress. She has no wheezes.  Neurological: She is alert and oriented to person, place, and time. No cranial nerve deficit. Coordination normal.  Skin: Skin is warm  and dry. No rash noted. No erythema.  Psychiatric: She has a normal mood and affect. Her behavior is normal. Judgment and thought content normal.    Lab Results  Component Value Date   WBC 6.4 02/16/2013   HGB 13.5 02/16/2013   HCT 40.8 02/16/2013   PLT 212.0 02/16/2013   GLUCOSE 100* 07/03/2012   CHOL 190 07/03/2012   TRIG 110.0 07/03/2012   HDL 72.10 07/03/2012   LDLCALC 96 07/03/2012   ALT 19 07/03/2012    AST 25 07/03/2012   NA 137 07/03/2012   K 4.6 07/03/2012   CL 102 07/03/2012   CREATININE 0.7 07/03/2012   BUN 17 07/03/2012   CO2 29 07/03/2012   TSH 1.84 02/16/2013      Assessment & Plan:   AWV/v70.0 - Today patient counseled on age appropriate routine health concerns for screening and prevention, each reviewed and up to date or declined. Immunizations reviewed and up to date or declined. Labs ordered and reviewed. Risk factors for depression reviewed and negative. Hearing function and visual acuity are intact. ADLs screened and addressed as needed. Functional ability and level of safety reviewed and appropriate. Education, counseling and referrals performed based on assessed risks today. Patient provided with a copy of personalized plan for preventive services.  R groin pain - working with ortho [place] for same - Charlann Boxer and Ramos - DG sows normal hip - no DJD - ?DDD lumbar source - s/p steroid injection R hip IA - 2 weeks relief per pt - advised follow up for furth consideration of back injections for pain

## 2013-08-10 NOTE — Progress Notes (Signed)
Pre-visit discussion using our clinic review tool. No additional management support is needed unless otherwise documented below in the visit note.  

## 2013-08-10 NOTE — Patient Instructions (Addendum)
It was good to see you today.  We have reviewed your prior records including labs and tests today  Health Maintenance reviewed - all recommended immunizations and age-appropriate screenings are up-to-date. Prevnar today  Ask [facility] about bone density (DEXA) this summer with mammogram!  Medications reviewed and updated, no changes recommended at this time.  Keep working with Dr Ethelene Hal on back/hip problems - call here if help needed  Please schedule followup in 12 months for annual exam and labs, call sooner if problems.  Health Maintenance, Female A healthy lifestyle and preventative care can promote health and wellness.  Maintain regular health, dental, and eye exams.  Eat a healthy diet. Foods like vegetables, fruits, whole grains, low-fat dairy products, and lean protein foods contain the nutrients you need without too many calories. Decrease your intake of foods high in solid fats, added sugars, and salt. Get information about a proper diet from your caregiver, if necessary.  Regular physical exercise is one of the most important things you can do for your health. Most adults should get at least 150 minutes of moderate-intensity exercise (any activity that increases your heart rate and causes you to sweat) each week. In addition, most adults need muscle-strengthening exercises on 2 or more days a week.   Maintain a healthy weight. The body mass index (BMI) is a screening tool to identify possible weight problems. It provides an estimate of body fat based on height and weight. Your caregiver can help determine your BMI, and can help you achieve or maintain a healthy weight. For adults 20 years and older:  A BMI below 18.5 is considered underweight.  A BMI of 18.5 to 24.9 is normal.  A BMI of 25 to 29.9 is considered overweight.  A BMI of 30 and above is considered obese.  Maintain normal blood lipids and cholesterol by exercising and minimizing your intake of saturated fat. Eat a  balanced diet with plenty of fruits and vegetables. Blood tests for lipids and cholesterol should begin at age 58 and be repeated every 5 years. If your lipid or cholesterol levels are high, you are over 50, or you are a high risk for heart disease, you may need your cholesterol levels checked more frequently.Ongoing high lipid and cholesterol levels should be treated with medicines if diet and exercise are not effective.  If you smoke, find out from your caregiver how to quit. If you do not use tobacco, do not start.  Lung cancer screening is recommended for adults aged 80 80 years who are at high risk for developing lung cancer because of a history of smoking. Yearly low-dose computed tomography (CT) is recommended for people who have at least a 30-pack-year history of smoking and are a current smoker or have quit within the past 15 years. A pack year of smoking is smoking an average of 1 pack of cigarettes a day for 1 year (for example: 1 pack a day for 30 years or 2 packs a day for 15 years). Yearly screening should continue until the smoker has stopped smoking for at least 15 years. Yearly screening should also be stopped for people who develop a health problem that would prevent them from having lung cancer treatment.  If you are pregnant, do not drink alcohol. If you are breastfeeding, be very cautious about drinking alcohol. If you are not pregnant and choose to drink alcohol, do not exceed 1 drink per day. One drink is considered to be 12 ounces (355 mL) of beer,  5 ounces (148 mL) of wine, or 1.5 ounces (44 mL) of liquor.  Avoid use of street drugs. Do not share needles with anyone. Ask for help if you need support or instructions about stopping the use of drugs.  High blood pressure causes heart disease and increases the risk of stroke. Blood pressure should be checked at least every 1 to 2 years. Ongoing high blood pressure should be treated with medicines, if weight loss and exercise are not  effective.  If you are 57 to 72 years old, ask your caregiver if you should take aspirin to prevent strokes.  Diabetes screening involves taking a blood sample to check your fasting blood sugar level. This should be done once every 3 years, after age 75, if you are within normal weight and without risk factors for diabetes. Testing should be considered at a younger age or be carried out more frequently if you are overweight and have at least 1 risk factor for diabetes.  Breast cancer screening is essential preventative care for women. You should practice "breast self-awareness." This means understanding the normal appearance and feel of your breasts and may include breast self-examination. Any changes detected, no matter how small, should be reported to a caregiver. Women in their 72s and 30s should have a clinical breast exam (CBE) by a caregiver as part of a regular health exam every 1 to 3 years. After age 78, women should have a CBE every year. Starting at age 68, women should consider having a mammogram (breast X-ray) every year. Women who have a family history of breast cancer should talk to their caregiver about genetic screening. Women at a high risk of breast cancer should talk to their caregiver about having an MRI and a mammogram every year.  Breast cancer gene (BRCA)-related cancer risk assessment is recommended for women who have family members with BRCA-related cancers. BRCA-related cancers include breast, ovarian, tubal, and peritoneal cancers. Having family members with these cancers may be associated with an increased risk for harmful changes (mutations) in the breast cancer genes BRCA1 and BRCA2. Results of the assessment will determine the need for genetic counseling and BRCA1 and BRCA2 testing.  The Pap test is a screening test for cervical cancer. Women should have a Pap test starting at age 60. Between ages 67 and 18, Pap tests should be repeated every 2 years. Beginning at age 62,  you should have a Pap test every 3 years as long as the past 3 Pap tests have been normal. If you had a hysterectomy for a problem that was not cancer or a condition that could lead to cancer, then you no longer need Pap tests. If you are between ages 26 and 43, and you have had normal Pap tests going back 10 years, you no longer need Pap tests. If you have had past treatment for cervical cancer or a condition that could lead to cancer, you need Pap tests and screening for cancer for at least 20 years after your treatment. If Pap tests have been discontinued, risk factors (such as a new sexual partner) need to be reassessed to determine if screening should be resumed. Some women have medical problems that increase the chance of getting cervical cancer. In these cases, your caregiver may recommend more frequent screening and Pap tests.  The human papillomavirus (HPV) test is an additional test that may be used for cervical cancer screening. The HPV test looks for the virus that can cause the cell changes on the  cervix. The cells collected during the Pap test can be tested for HPV. The HPV test could be used to screen women aged 25 years and older, and should be used in women of any age who have unclear Pap test results. After the age of 5, women should have HPV testing at the same frequency as a Pap test.  Colorectal cancer can be detected and often prevented. Most routine colorectal cancer screening begins at the age of 46 and continues through age 81. However, your caregiver may recommend screening at an earlier age if you have risk factors for colon cancer. On a yearly basis, your caregiver may provide home test kits to check for hidden blood in the stool. Use of a small camera at the end of a tube, to directly examine the colon (sigmoidoscopy or colonoscopy), can detect the earliest forms of colorectal cancer. Talk to your caregiver about this at age 90, when routine screening begins. Direct examination of  the colon should be repeated every 5 to 10 years through age 61, unless early forms of pre-cancerous polyps or small growths are found.  Hepatitis C blood testing is recommended for all people born from [birth year] through 1965 and any individual with known risks for hepatitis C.  Practice safe sex. Use condoms and avoid high-risk sexual practices to reduce the spread of sexually transmitted infections (STIs). Sexually active women aged 30 and younger should be checked for Chlamydia, which is a common sexually transmitted infection. Older women with new or multiple partners should also be tested for Chlamydia. Testing for other STIs is recommended if you are sexually active and at increased risk.  Osteoporosis is a disease in which the bones lose minerals and strength with aging. This can result in serious bone fractures. The risk of osteoporosis can be identified using a bone density scan. Women ages 21 and over and women at risk for fractures or osteoporosis should discuss screening with their caregivers. Ask your caregiver whether you should be taking a calcium supplement or vitamin D to reduce the rate of osteoporosis.  Menopause can be associated with physical symptoms and risks. Hormone replacement therapy is available to decrease symptoms and risks. You should talk to your caregiver about whether hormone replacement therapy is right for you.  Use sunscreen. Apply sunscreen liberally and repeatedly throughout the day. You should seek shade when your shadow is shorter than you. Protect yourself by wearing long sleeves, pants, a wide-brimmed hat, and sunglasses year round, whenever you are outdoors.  Notify your caregiver of new moles or changes in moles, especially if there is a change in shape or color. Also notify your caregiver if a mole is larger than the size of a pencil eraser.  Stay current with your immunizations. Document Released: 02/11/2011 Document Revised: 11/23/2012 Document Reviewed:  02/11/2011 [facility] Patient Information 2014 [place], [place].

## 2013-09-02 DIAGNOSIS — Z961 Presence of intraocular lens: Secondary | ICD-10-CM | POA: Diagnosis not present

## 2013-09-02 DIAGNOSIS — H4011X0 Primary open-angle glaucoma, stage unspecified: Secondary | ICD-10-CM | POA: Diagnosis not present

## 2013-09-02 DIAGNOSIS — H2510 Age-related nuclear cataract, unspecified eye: Secondary | ICD-10-CM | POA: Diagnosis not present

## 2013-09-02 DIAGNOSIS — H409 Unspecified glaucoma: Secondary | ICD-10-CM | POA: Diagnosis not present

## 2013-09-13 DIAGNOSIS — H4011X0 Primary open-angle glaucoma, stage unspecified: Secondary | ICD-10-CM | POA: Diagnosis not present

## 2013-09-13 DIAGNOSIS — H2510 Age-related nuclear cataract, unspecified eye: Secondary | ICD-10-CM | POA: Diagnosis not present

## 2013-09-13 DIAGNOSIS — H409 Unspecified glaucoma: Secondary | ICD-10-CM | POA: Diagnosis not present

## 2013-09-13 DIAGNOSIS — H02409 Unspecified ptosis of unspecified eyelid: Secondary | ICD-10-CM | POA: Diagnosis not present

## 2013-09-27 ENCOUNTER — Other Ambulatory Visit: Payer: Self-pay | Admitting: *Deleted

## 2013-09-27 MED ORDER — SERTRALINE HCL 50 MG PO TABS: 50.0000 mg | ORAL_TABLET | Freq: Every day | ORAL | Status: DC

## 2013-09-27 MED ORDER — OMEPRAZOLE 20 MG PO TBEC: 1.0000 | DELAYED_RELEASE_TABLET | Freq: Every day | ORAL | Status: DC

## 2013-10-11 DIAGNOSIS — M25559 Pain in unspecified hip: Secondary | ICD-10-CM | POA: Diagnosis not present

## 2013-10-11 DIAGNOSIS — M79609 Pain in unspecified limb: Secondary | ICD-10-CM | POA: Diagnosis not present

## 2013-10-27 DIAGNOSIS — M169 Osteoarthritis of hip, unspecified: Secondary | ICD-10-CM | POA: Diagnosis not present

## 2013-10-27 DIAGNOSIS — M25559 Pain in unspecified hip: Secondary | ICD-10-CM | POA: Diagnosis not present

## 2013-10-27 DIAGNOSIS — M1610 Unilateral primary osteoarthritis, unspecified hip: Secondary | ICD-10-CM | POA: Diagnosis not present

## 2013-12-08 DIAGNOSIS — M25559 Pain in unspecified hip: Secondary | ICD-10-CM | POA: Diagnosis not present

## 2013-12-17 DIAGNOSIS — M25559 Pain in unspecified hip: Secondary | ICD-10-CM | POA: Diagnosis not present

## 2014-01-18 DIAGNOSIS — M545 Low back pain, unspecified: Secondary | ICD-10-CM | POA: Diagnosis not present

## 2014-01-26 DIAGNOSIS — M4120 Other idiopathic scoliosis, site unspecified: Secondary | ICD-10-CM | POA: Diagnosis not present

## 2014-01-26 DIAGNOSIS — M5137 Other intervertebral disc degeneration, lumbosacral region: Secondary | ICD-10-CM | POA: Diagnosis not present

## 2014-02-02 DIAGNOSIS — Z1231 Encounter for screening mammogram for malignant neoplasm of breast: Secondary | ICD-10-CM | POA: Diagnosis not present

## 2014-02-07 NOTE — Progress Notes (Signed)
Digital breast tomosynthesis imaging has been obtained. Scattered areas of fibroglandular density.   Benign calcifications of the left breast. No significant masses, calcifications, or other findings are seen in either breast.  There has been no significant interval changes.  Impression: There is no mammographic evidence of malignancy.  Routine mammographic evaluation in 1 year is recommended.

## 2014-02-08 DIAGNOSIS — Z780 Asymptomatic menopausal state: Secondary | ICD-10-CM | POA: Diagnosis not present

## 2014-02-08 DIAGNOSIS — Z8262 Family history of osteoporosis: Secondary | ICD-10-CM | POA: Diagnosis not present

## 2014-02-08 LAB — HM DEXA SCAN

## 2014-02-14 ENCOUNTER — Encounter: Payer: Self-pay | Admitting: Internal Medicine

## 2014-02-15 DIAGNOSIS — M545 Low back pain, unspecified: Secondary | ICD-10-CM | POA: Diagnosis not present

## 2014-02-17 ENCOUNTER — Encounter: Payer: Self-pay | Admitting: Internal Medicine

## 2014-02-21 DIAGNOSIS — H02409 Unspecified ptosis of unspecified eyelid: Secondary | ICD-10-CM | POA: Diagnosis not present

## 2014-02-21 DIAGNOSIS — H4011X0 Primary open-angle glaucoma, stage unspecified: Secondary | ICD-10-CM | POA: Diagnosis not present

## 2014-02-21 DIAGNOSIS — H2510 Age-related nuclear cataract, unspecified eye: Secondary | ICD-10-CM | POA: Diagnosis not present

## 2014-02-21 DIAGNOSIS — H409 Unspecified glaucoma: Secondary | ICD-10-CM | POA: Diagnosis not present

## 2014-02-23 ENCOUNTER — Encounter: Payer: Self-pay | Admitting: Internal Medicine

## 2014-02-23 ENCOUNTER — Ambulatory Visit (INDEPENDENT_AMBULATORY_CARE_PROVIDER_SITE_OTHER): Payer: Medicare Other | Admitting: Internal Medicine

## 2014-02-23 ENCOUNTER — Other Ambulatory Visit (INDEPENDENT_AMBULATORY_CARE_PROVIDER_SITE_OTHER): Payer: Medicare Other

## 2014-02-23 VITALS — BP 140/70 | HR 78 | Temp 98.2°F | Ht 65.0 in | Wt 146.8 lb

## 2014-02-23 DIAGNOSIS — R5381 Other malaise: Secondary | ICD-10-CM | POA: Diagnosis not present

## 2014-02-23 DIAGNOSIS — D509 Iron deficiency anemia, unspecified: Secondary | ICD-10-CM

## 2014-02-23 DIAGNOSIS — Z23 Encounter for immunization: Secondary | ICD-10-CM

## 2014-02-23 DIAGNOSIS — R5383 Other fatigue: Secondary | ICD-10-CM

## 2014-02-23 DIAGNOSIS — Z961 Presence of intraocular lens: Secondary | ICD-10-CM | POA: Diagnosis not present

## 2014-02-23 DIAGNOSIS — H4011X0 Primary open-angle glaucoma, stage unspecified: Secondary | ICD-10-CM | POA: Diagnosis not present

## 2014-02-23 DIAGNOSIS — H409 Unspecified glaucoma: Secondary | ICD-10-CM | POA: Diagnosis not present

## 2014-02-23 DIAGNOSIS — H2510 Age-related nuclear cataract, unspecified eye: Secondary | ICD-10-CM | POA: Diagnosis not present

## 2014-02-23 LAB — BASIC METABOLIC PANEL
BUN: 14 mg/dL (ref 6–23)
CHLORIDE: 104 meq/L (ref 96–112)
CO2: 28 mEq/L (ref 19–32)
Calcium: 9.6 mg/dL (ref 8.4–10.5)
Creatinine, Ser: 0.7 mg/dL (ref 0.4–1.2)
GFR: 85.64 mL/min (ref >60.00)
Glucose, Bld: 99 mg/dL (ref 70–99)
POTASSIUM: 4.4 meq/L (ref 3.5–5.1)
SODIUM: 137 meq/L (ref 135–145)

## 2014-02-23 LAB — CBC WITH DIFFERENTIAL/PLATELET
BASOS ABS: 0 10*3/uL (ref 0.0–0.1)
Basophils Relative: 0.7 % (ref 0.0–3.0)
EOS PCT: 5 % (ref 0.0–5.0)
Eosinophils Absolute: 0.3 10*3/uL (ref 0.0–0.7)
HEMATOCRIT: 37.7 % (ref 36.0–46.0)
Hemoglobin: 12.4 g/dL (ref 12.0–15.0)
LYMPHS ABS: 2.9 10*3/uL (ref 0.7–4.0)
LYMPHS PCT: 46.6 % — AB (ref 12.0–46.0)
MCHC: 33 g/dL (ref 30.0–36.0)
MCV: 91.9 fl (ref 78.0–100.0)
MONOS PCT: 7.5 % (ref 3.0–12.0)
Monocytes Absolute: 0.5 10*3/uL (ref 0.1–1.0)
NEUTROS PCT: 40.2 % — AB (ref 43.0–77.0)
Neutro Abs: 2.5 10*3/uL (ref 1.4–7.7)
PLATELETS: 250 10*3/uL (ref 150.0–400.0)
RBC: 4.1 Mil/uL (ref 3.87–5.11)
RDW: 15.7 % — AB (ref 11.5–15.5)
WBC: 6.2 10*3/uL (ref 4.0–10.5)

## 2014-02-23 LAB — VITAMIN B12: VITAMIN B 12: 539 pg/mL (ref 211–911)

## 2014-02-23 LAB — HEPATIC FUNCTION PANEL
ALK PHOS: 55 U/L (ref 39–117)
ALT: 14 U/L (ref 0–35)
AST: 19 U/L (ref 0–37)
Albumin: 4.3 g/dL (ref 3.5–5.2)
BILIRUBIN DIRECT: 0.1 mg/dL (ref 0.0–0.3)
BILIRUBIN TOTAL: 0.5 mg/dL (ref 0.2–1.2)
Total Protein: 7 g/dL (ref 6.0–8.3)

## 2014-02-23 LAB — TSH: TSH: 1.18 u[IU]/mL (ref 0.35–4.50)

## 2014-02-23 LAB — FERRITIN: FERRITIN: 8.3 ng/mL — AB (ref 10.0–291.0)

## 2014-02-23 NOTE — Patient Instructions (Signed)
It was good to see you today.  We have reviewed your prior records including labs and tests today  Tdap updated today  Test(s) ordered today. Your results will be released to [place] (or called to you) after review, usually within 72hours after test completion. If any changes need to be made, you will be notified at that same time.  continue working with your specialists as ongoing  Medications reviewed and updated, no changes recommended at this time.  Please schedule followup in 6-12 months for annual exam/labs, call sooner if problems.

## 2014-02-23 NOTE — Assessment & Plan Note (Signed)
Long hx same - prior GI eval EGD 2007, colo 2005 unremarkable- Not routinely on oral iron as prev rx'd due to nausea recheck levels ferritin, b12 and CBC now -   Lab Results  Component Value Date   WBC 6.4 02/16/2013   HGB 13.5 02/16/2013   HCT 40.8 02/16/2013   MCV 92.6 02/16/2013   PLT 212.0 02/16/2013   Lab Results  Component Value Date   FERRITIN 18.4 02/16/2013

## 2014-02-23 NOTE — Progress Notes (Signed)
Subjective:    Patient ID: Linda [name], female    DOB: 1941/02/10, 73 y.o.   MRN: [id number]  HPI  Patient is here for follow up  Reviewed chronic medical issues and interval medical events  Past Medical History  Diagnosis Date  . TOBACCO USE, QUIT   . COLONIC POLYPS, HX OF 2007  . ALLERGIC RHINITIS   . ANEMIA-IRON DEFICIENCY   . Anxiety state, unspecified   . OSTEOARTHRITIS   . GERD   . GLAUCOMA   . Carpal tunnel syndrome   . HIATAL HERNIA WITH REFLUX   . GOITER, MULTINODULAR     Review of Systems  Constitutional: Positive for fatigue. Negative for unexpected weight change.  Respiratory: Negative for cough and shortness of breath.   Cardiovascular: Negative for chest pain and leg swelling.  Musculoskeletal: Positive for arthralgias and back pain (working with [facility] - ESI prn and nerve blockw/ good relief).  Neurological: Negative for dizziness and headaches.       Objective:   Physical Exam  BP 140/70  Pulse 78  Temp(Src) 98.2 F (36.8 C) (Oral)  Ht 5\' 5"  (1.651 m)  Wt 146 lb 12.8 oz (66.588 kg)  BMI 24.43 kg/m2  SpO2 96% Wt Readings from Last 3 Encounters:  02/23/14 146 lb 12.8 oz (66.588 kg)  08/10/13 139 lb 6.4 oz (63.231 kg)  02/08/13 140 lb 3.2 oz (63.594 kg)   Constitutional: She appears well-developed and well-nourished. No distress.  Neck: Normal range of motion. Neck supple. No JVD present. No thyromegaly present.  Cardiovascular: Normal rate, regular rhythm and normal heart sounds.  No murmur heard. No BLE edema. Pulmonary/Chest: Effort normal and breath sounds normal. No respiratory distress. She has no wheezes.  Psychiatric: She has a normal mood and affect. Her behavior is normal. Judgment and thought content normal.   Lab Results  Component Value Date   WBC 6.4 02/16/2013   HGB 13.5 02/16/2013   HCT 40.8 02/16/2013   PLT 212.0 02/16/2013   GLUCOSE 100* 07/03/2012   CHOL 190 07/03/2012   TRIG 110.0 07/03/2012   HDL 72.10 07/03/2012   LDLCALC 96 07/03/2012   ALT 19 07/03/2012   AST 25 07/03/2012   NA 137 07/03/2012   K 4.6 07/03/2012   CL 102 07/03/2012   CREATININE 0.7 07/03/2012   BUN 17 07/03/2012   CO2 29 07/03/2012   TSH 1.84 02/16/2013    US Soft Tissue Head/neck  01/14/2012   *RADIOLOGY REPORT*  Clinical Data: Possible thyroid goiter, follow-up of nodules  THYROID ULTRASOUND  Technique: Ultrasound examination of the thyroid gland and adjacent soft tissues was performed.  Comparison:  Ultrasound of the thyroid of 01/01/2005  Findings:  Right thyroid lobe:  3.7 x 0.8 x 1.2 cm.  (Previously 3.8 x 1.0 x 1.1 cm) Left thyroid lobe:  3.7 x 0.8 x 1.4 cm.  (Previously 3.3 x 0.9 x 1.2 cm). Isthmus:  1.6 mm in thickness.  Focal nodules:  There are multiple thyroid nodules bilaterally.  A solid nodule in the mid right lobe measures 8 x 6 x 4 mm. A solid nodule in the lower pole on the right measures 6 x 5 x 7 mm. A solid nodule in the left isthmus measures 7 x 3 x 7 mm.  The majority of the remainder of nodules are primarily hypoechoic and measure no more than 6 mm in diameter.  Lymphadenopathy:  None visualized.  IMPRESSION: Only small nodules are noted bilaterally of no more  than 8 mm in diameter.  Original Report Authenticated By: Joretta Bachelor, M.D.      Assessment & Plan:   Fatigue - nonspecific symptoms/exam - check screening labs  Problem List Items Addressed This Visit   ANEMIA-IRON DEFICIENCY      Long hx same - prior GI eval EGD 2007, colo 2005 unremarkable- Not routinely on oral iron as prev rx'd due to nausea recheck levels ferritin, b12 and CBC now -   Lab Results  Component Value Date   WBC 6.4 02/16/2013   HGB 13.5 02/16/2013   HCT 40.8 02/16/2013   MCV 92.6 02/16/2013   PLT 212.0 02/16/2013   Lab Results  Component Value Date   FERRITIN 18.4 02/16/2013      Relevant Orders      CBC with Differential      Ferritin      Vitamin B12    Other Visit Diagnoses   Other fatigue    -  Primary    Relevant Orders        CBC with Differential       Ferritin       Vitamin B12       Basic metabolic panel       Hepatic function panel       TSH    Need for prophylactic vaccination with combined diphtheria-tetanus-pertussis (DTP) vaccine        Relevant Orders       Tdap vaccine greater than or equal to 7yo IM       Dtr and son in law adopting infant in next 2 weeks - requests Tdap - update same today

## 2014-02-23 NOTE — Progress Notes (Signed)
Pre visit review using our clinic review tool, if applicable. No additional management support is needed unless otherwise documented below in the visit note. 

## 2014-03-23 DIAGNOSIS — M4120 Other idiopathic scoliosis, site unspecified: Secondary | ICD-10-CM | POA: Diagnosis not present

## 2014-03-23 DIAGNOSIS — M545 Low back pain, unspecified: Secondary | ICD-10-CM | POA: Diagnosis not present

## 2014-04-13 DIAGNOSIS — M47817 Spondylosis without myelopathy or radiculopathy, lumbosacral region: Secondary | ICD-10-CM | POA: Diagnosis not present

## 2014-04-28 DIAGNOSIS — M545 Low back pain, unspecified: Secondary | ICD-10-CM | POA: Diagnosis not present

## 2014-04-28 DIAGNOSIS — M533 Sacrococcygeal disorders, not elsewhere classified: Secondary | ICD-10-CM | POA: Diagnosis not present

## 2014-05-18 DIAGNOSIS — M545 Low back pain: Secondary | ICD-10-CM | POA: Diagnosis not present

## 2014-05-18 DIAGNOSIS — M533 Sacrococcygeal disorders, not elsewhere classified: Secondary | ICD-10-CM | POA: Diagnosis not present

## 2014-05-30 DIAGNOSIS — H903 Sensorineural hearing loss, bilateral: Secondary | ICD-10-CM | POA: Diagnosis not present

## 2014-06-13 DIAGNOSIS — H2511 Age-related nuclear cataract, right eye: Secondary | ICD-10-CM | POA: Diagnosis not present

## 2014-06-13 DIAGNOSIS — H4011X4 Primary open-angle glaucoma, indeterminate stage: Secondary | ICD-10-CM | POA: Diagnosis not present

## 2014-06-13 DIAGNOSIS — H02403 Unspecified ptosis of bilateral eyelids: Secondary | ICD-10-CM | POA: Diagnosis not present

## 2014-06-23 DIAGNOSIS — Z23 Encounter for immunization: Secondary | ICD-10-CM | POA: Diagnosis not present

## 2014-09-07 DIAGNOSIS — M533 Sacrococcygeal disorders, not elsewhere classified: Secondary | ICD-10-CM | POA: Diagnosis not present

## 2014-09-23 ENCOUNTER — Encounter: Payer: Self-pay | Admitting: Family

## 2014-09-23 ENCOUNTER — Ambulatory Visit (INDEPENDENT_AMBULATORY_CARE_PROVIDER_SITE_OTHER): Payer: Medicare Other | Admitting: Family

## 2014-09-23 VITALS — BP 170/82 | HR 75 | Temp 97.6°F | Resp 18 | Ht 64.0 in | Wt 148.0 lb

## 2014-09-23 DIAGNOSIS — F411 Generalized anxiety disorder: Secondary | ICD-10-CM | POA: Diagnosis not present

## 2014-09-23 MED ORDER — SERTRALINE HCL 50 MG PO TABS: 50.0000 mg | ORAL_TABLET | Freq: Every day | ORAL | Status: DC

## 2014-09-23 NOTE — Progress Notes (Signed)
Pre visit review using our clinic review tool, if applicable. No additional management support is needed unless otherwise documented below in the visit note. 

## 2014-09-23 NOTE — Patient Instructions (Signed)
Thank you for choosing [facility].  Summary/Instructions:  Your prescription(s) have been submitted to your pharmacy or been printed and provided for you. Please take as directed and contact our office if you believe you are having problem(s) with the medication(s) or have any questions.  If your symptoms worsen or fail to improve, please contact our office for further instruction, or in case of emergency go directly to the emergency room at the closest medical facility.     

## 2014-09-23 NOTE — Assessment & Plan Note (Signed)
Anxiety appears stable on Zoloft with occasional exacerbations. Continue Zoloft 50 mg daily. Medication refilled.

## 2014-09-23 NOTE — Progress Notes (Signed)
   Subjective:    Patient ID: Linda [name], female    DOB: 30-Oct-1940, 74 y.o.   MRN: [id number]  Chief Complaint  Patient presents with  . Medication Refill    was told to come in for a med refill bc she hasn't been seen in 6 months, needs zoloft    HPI:  Linda [name] is a 74 y.o. female who presents today for follow up of stress and anxiety.  Currently treated with zoloft, which she indicates is working okay. Occasionally continues to experience the associated symptoms of stress and anxiety usually related to family stressors. Denies any adverse effects.   Wt Readings from Last 3 Encounters:  09/23/14 148 lb (67.132 kg)  02/23/14 146 lb 12.8 oz (66.588 kg)  08/10/13 139 lb 6.4 oz (63.231 kg)    Review of Systems  Constitutional: Negative for unexpected weight change.  Psychiatric/Behavioral: Negative for suicidal ideas. The patient is nervous/anxious (Occasional).       Objective:    BP 170/82 mmHg  Pulse 75  Temp(Src) 97.6 F (36.4 C) (Oral)  Resp 18  Ht 5\' 4"  (1.626 m)  Wt 148 lb (67.132 kg)  BMI 25.39 kg/m2  SpO2 97% Nursing note and vital signs reviewed.  Physical Exam  Constitutional: She is oriented to person, place, and time. She appears well-developed and well-nourished. No distress.  Cardiovascular: Normal rate, regular rhythm, normal heart sounds and intact distal pulses.   Pulmonary/Chest: Effort normal and breath sounds normal.  Neurological: She is alert and oriented to person, place, and time.  Skin: Skin is warm and dry.  Psychiatric: She has a normal mood and affect. Her behavior is normal. Judgment and thought content normal.       Assessment & Plan:

## 2014-10-27 ENCOUNTER — Telehealth: Payer: Self-pay | Admitting: Internal Medicine

## 2014-10-27 ENCOUNTER — Telehealth: Payer: Self-pay

## 2014-10-27 NOTE — Telephone Encounter (Signed)
Pt called stated that she had her flu shot at the end of October 2015.

## 2014-10-27 NOTE — Telephone Encounter (Signed)
Left message for pt to call back to schedule if she still wants flu vaccine

## 2014-11-02 ENCOUNTER — Telehealth: Payer: Self-pay

## 2014-11-02 NOTE — Telephone Encounter (Signed)
Pt stated she had flu vaccine, chart has been updated

## 2014-11-23 DIAGNOSIS — M533 Sacrococcygeal disorders, not elsewhere classified: Secondary | ICD-10-CM | POA: Diagnosis not present

## 2014-12-12 DIAGNOSIS — H4011X4 Primary open-angle glaucoma, indeterminate stage: Secondary | ICD-10-CM | POA: Diagnosis not present

## 2014-12-12 DIAGNOSIS — H2511 Age-related nuclear cataract, right eye: Secondary | ICD-10-CM | POA: Diagnosis not present

## 2014-12-12 DIAGNOSIS — H02403 Unspecified ptosis of bilateral eyelids: Secondary | ICD-10-CM | POA: Diagnosis not present

## 2014-12-27 DIAGNOSIS — M419 Scoliosis, unspecified: Secondary | ICD-10-CM | POA: Diagnosis not present

## 2014-12-27 DIAGNOSIS — M25551 Pain in right hip: Secondary | ICD-10-CM | POA: Diagnosis not present

## 2015-01-24 DIAGNOSIS — M1611 Unilateral primary osteoarthritis, right hip: Secondary | ICD-10-CM | POA: Diagnosis not present

## 2015-01-24 DIAGNOSIS — M162 Bilateral osteoarthritis resulting from hip dysplasia: Secondary | ICD-10-CM | POA: Diagnosis not present

## 2015-01-24 DIAGNOSIS — M25551 Pain in right hip: Secondary | ICD-10-CM | POA: Diagnosis not present

## 2015-01-24 DIAGNOSIS — S73191A Other sprain of right hip, initial encounter: Secondary | ICD-10-CM | POA: Diagnosis not present

## 2015-01-25 DIAGNOSIS — M162 Bilateral osteoarthritis resulting from hip dysplasia: Secondary | ICD-10-CM | POA: Insufficient documentation

## 2015-01-25 DIAGNOSIS — S73191A Other sprain of right hip, initial encounter: Secondary | ICD-10-CM | POA: Insufficient documentation

## 2015-01-25 DIAGNOSIS — M1611 Unilateral primary osteoarthritis, right hip: Secondary | ICD-10-CM | POA: Insufficient documentation

## 2015-01-25 DIAGNOSIS — M84351A Stress fracture, right femur, initial encounter for fracture: Secondary | ICD-10-CM | POA: Insufficient documentation

## 2015-02-01 DIAGNOSIS — M25651 Stiffness of right hip, not elsewhere classified: Secondary | ICD-10-CM | POA: Diagnosis not present

## 2015-02-01 DIAGNOSIS — M25551 Pain in right hip: Secondary | ICD-10-CM | POA: Diagnosis not present

## 2015-02-01 DIAGNOSIS — M6281 Muscle weakness (generalized): Secondary | ICD-10-CM | POA: Diagnosis not present

## 2015-02-07 DIAGNOSIS — M6281 Muscle weakness (generalized): Secondary | ICD-10-CM | POA: Diagnosis not present

## 2015-02-07 DIAGNOSIS — M25651 Stiffness of right hip, not elsewhere classified: Secondary | ICD-10-CM | POA: Diagnosis not present

## 2015-02-07 DIAGNOSIS — M25551 Pain in right hip: Secondary | ICD-10-CM | POA: Diagnosis not present

## 2015-02-08 DIAGNOSIS — Z1231 Encounter for screening mammogram for malignant neoplasm of breast: Secondary | ICD-10-CM | POA: Diagnosis not present

## 2015-02-08 DIAGNOSIS — Z803 Family history of malignant neoplasm of breast: Secondary | ICD-10-CM | POA: Diagnosis not present

## 2015-02-08 LAB — HM MAMMOGRAPHY

## 2015-02-09 ENCOUNTER — Encounter: Payer: Self-pay | Admitting: Internal Medicine

## 2015-02-09 DIAGNOSIS — M6281 Muscle weakness (generalized): Secondary | ICD-10-CM | POA: Diagnosis not present

## 2015-02-09 DIAGNOSIS — M25551 Pain in right hip: Secondary | ICD-10-CM | POA: Diagnosis not present

## 2015-02-09 DIAGNOSIS — M25651 Stiffness of right hip, not elsewhere classified: Secondary | ICD-10-CM | POA: Diagnosis not present

## 2015-02-15 DIAGNOSIS — M25651 Stiffness of right hip, not elsewhere classified: Secondary | ICD-10-CM | POA: Diagnosis not present

## 2015-02-15 DIAGNOSIS — M25551 Pain in right hip: Secondary | ICD-10-CM | POA: Diagnosis not present

## 2015-02-15 DIAGNOSIS — M6281 Muscle weakness (generalized): Secondary | ICD-10-CM | POA: Diagnosis not present

## 2015-02-21 DIAGNOSIS — M6281 Muscle weakness (generalized): Secondary | ICD-10-CM | POA: Diagnosis not present

## 2015-02-21 DIAGNOSIS — M25551 Pain in right hip: Secondary | ICD-10-CM | POA: Diagnosis not present

## 2015-02-21 DIAGNOSIS — M25651 Stiffness of right hip, not elsewhere classified: Secondary | ICD-10-CM | POA: Diagnosis not present

## 2015-02-27 ENCOUNTER — Encounter: Payer: Medicare Other | Admitting: Internal Medicine

## 2015-02-28 ENCOUNTER — Encounter: Payer: Self-pay | Admitting: Internal Medicine

## 2015-02-28 ENCOUNTER — Other Ambulatory Visit (INDEPENDENT_AMBULATORY_CARE_PROVIDER_SITE_OTHER): Payer: Medicare Other

## 2015-02-28 ENCOUNTER — Ambulatory Visit (INDEPENDENT_AMBULATORY_CARE_PROVIDER_SITE_OTHER): Payer: Medicare Other | Admitting: Internal Medicine

## 2015-02-28 VITALS — BP 138/74 | HR 70 | Temp 97.7°F | Ht 64.0 in | Wt 148.0 lb

## 2015-02-28 DIAGNOSIS — M21859 Other specified acquired deformities of unspecified thigh: Secondary | ICD-10-CM | POA: Insufficient documentation

## 2015-02-28 DIAGNOSIS — D509 Iron deficiency anemia, unspecified: Secondary | ICD-10-CM

## 2015-02-28 DIAGNOSIS — F411 Generalized anxiety disorder: Secondary | ICD-10-CM | POA: Diagnosis not present

## 2015-02-28 DIAGNOSIS — R5382 Chronic fatigue, unspecified: Secondary | ICD-10-CM | POA: Diagnosis not present

## 2015-02-28 DIAGNOSIS — Z0000 Encounter for general adult medical examination without abnormal findings: Secondary | ICD-10-CM

## 2015-02-28 DIAGNOSIS — Z1211 Encounter for screening for malignant neoplasm of colon: Secondary | ICD-10-CM | POA: Diagnosis not present

## 2015-02-28 DIAGNOSIS — M25651 Stiffness of right hip, not elsewhere classified: Secondary | ICD-10-CM | POA: Diagnosis not present

## 2015-02-28 DIAGNOSIS — M6281 Muscle weakness (generalized): Secondary | ICD-10-CM | POA: Diagnosis not present

## 2015-02-28 DIAGNOSIS — M25551 Pain in right hip: Secondary | ICD-10-CM | POA: Diagnosis not present

## 2015-02-28 DIAGNOSIS — E042 Nontoxic multinodular goiter: Secondary | ICD-10-CM | POA: Diagnosis not present

## 2015-02-28 LAB — BASIC METABOLIC PANEL
BUN: 16 mg/dL (ref 6–23)
CO2: 28 mEq/L (ref 19–32)
Calcium: 9.1 mg/dL (ref 8.4–10.5)
Chloride: 103 mEq/L (ref 96–112)
Creatinine, Ser: 0.62 mg/dL (ref 0.40–1.20)
GFR: 99.87 mL/min (ref >60.00)
Glucose, Bld: 88 mg/dL (ref 70–99)
Potassium: 4 mEq/L (ref 3.5–5.1)
Sodium: 138 mEq/L (ref 135–145)

## 2015-02-28 LAB — CBC WITH DIFFERENTIAL/PLATELET
Basophils Absolute: 0 10*3/uL (ref 0.0–0.1)
Basophils Relative: 0.5 % (ref 0.0–3.0)
EOS PCT: 6.2 % — AB (ref 0.0–5.0)
Eosinophils Absolute: 0.5 10*3/uL (ref 0.0–0.7)
HCT: 38.4 % (ref 36.0–46.0)
Hemoglobin: 12.8 g/dL (ref 12.0–15.0)
Lymphocytes Relative: 35.9 % (ref 12.0–46.0)
Lymphs Abs: 2.6 10*3/uL (ref 0.7–4.0)
MCHC: 33.2 g/dL (ref 30.0–36.0)
MCV: 92.9 fl (ref 78.0–100.0)
MONOS PCT: 6.7 % (ref 3.0–12.0)
Monocytes Absolute: 0.5 10*3/uL (ref 0.1–1.0)
Neutro Abs: 3.7 10*3/uL (ref 1.4–7.7)
Neutrophils Relative %: 50.7 % (ref 43.0–77.0)
Platelets: 203 10*3/uL (ref 150.0–400.0)
RBC: 4.14 Mil/uL (ref 3.87–5.11)
RDW: 14.6 % (ref 11.5–15.5)
WBC: 7.4 10*3/uL (ref 4.0–10.5)

## 2015-02-28 LAB — HEPATIC FUNCTION PANEL
ALT: 13 U/L (ref 0–35)
AST: 17 U/L (ref 0–37)
Albumin: 4.4 g/dL (ref 3.5–5.2)
Alkaline Phosphatase: 65 U/L (ref 39–117)
Bilirubin, Direct: 0.1 mg/dL (ref 0.0–0.3)
TOTAL PROTEIN: 6.7 g/dL (ref 6.0–8.3)
Total Bilirubin: 0.3 mg/dL (ref 0.2–1.2)

## 2015-02-28 LAB — TSH: TSH: 0.93 u[IU]/mL (ref 0.35–4.50)

## 2015-02-28 LAB — FERRITIN: Ferritin: 31.6 ng/mL (ref 10.0–291.0)

## 2015-02-28 MED ORDER — SERTRALINE HCL 100 MG PO TABS: 100.0000 mg | ORAL_TABLET | Freq: Every day | ORAL | Status: DC

## 2015-02-28 MED ORDER — GABAPENTIN 100 MG PO CAPS: 200.0000 mg | ORAL_CAPSULE | Freq: Every day | ORAL | Status: DC

## 2015-02-28 MED ORDER — OMEPRAZOLE 20 MG PO TBEC: 1.0000 | DELAYED_RELEASE_TABLET | Freq: Every day | ORAL | Status: DC

## 2015-02-28 NOTE — Progress Notes (Signed)
Subjective:    Patient ID: Linda [name], female    DOB: 08-Feb-1941, 74 y.o.   MRN: [id number]  HPI   Here for medicare wellness  Diet: heart healthy  Physical activity: sedentary Depression/mood screen: negative Hearing: intact to whispered voice Visual acuity: grossly normal, performs annual eye exam  ADLs: capable Fall risk: none Home safety: good Cognitive evaluation: intact to orientation, naming, recall and repetition EOL planning: adv directives reviewed  I have personally reviewed and have noted 1. The patient's medical and social history 2. Their use of alcohol, tobacco or illicit drugs 3. Their current medications and supplements 4. The patient's functional ability including ADL's, fall risks, home safety risks and hearing or visual impairment. 5. Diet and physical activities 6. Evidence for depression or mood disorders  Also reviewed chronic medical conditions, interval events and current concerns  Past Medical History  Diagnosis Date  . TOBACCO USE, QUIT   . COLONIC POLYPS, HX OF 2007  . ALLERGIC RHINITIS   . ANEMIA-IRON DEFICIENCY   . Anxiety state, unspecified   . OSTEOARTHRITIS   . GERD   . GLAUCOMA   . Carpal tunnel syndrome   . HIATAL HERNIA WITH REFLUX   . GOITER, MULTINODULAR     on [place] 2006, unchanged 6/13 with nodules all <63mm  . Hip dysplasia, acquired     B sx; eval at [facility] for same 01/2015 -ongoing PT   Family History  Problem Relation Age of Onset  . Diabetes Mother   . Heart disease Mother   . Arthritis Mother   . Hypertension Mother   . Arthritis Father   . Colon cancer Other     grandparent not sure which 1   History  Substance Use Topics  . Smoking status: Former Smoker    Quit date: 08/12/1988  . Smokeless tobacco: Not on file  . Alcohol Use: 0.0 oz/week    0 Standard drinks or equivalent per week     Review of Systems  Constitutional: Positive for fatigue. Negative for unexpected weight change.  Respiratory:  Negative for cough, shortness of breath and wheezing.   Cardiovascular: Negative for chest pain, palpitations and leg swelling.  Gastrointestinal: Negative for nausea, abdominal pain and diarrhea.  Musculoskeletal: Positive for arthralgias.  Neurological: Negative for dizziness, weakness, light-headedness and headaches.  Psychiatric/Behavioral: Negative for suicidal ideas, sleep disturbance, self-injury, dysphoric mood and decreased concentration. The patient is nervous/anxious (situational, increasing sx).   All other systems reviewed and are negative.   Patient Care Team: Rowe Clack, MD as PCP - General Sable Feil, MD as Consulting Physician (Gastroenterology) Melida Quitter, MD as Consulting Physician Princess Bruins, MD (Obstetrics and Gynecology) Paralee Cancel, MD (Orthopedic Surgery) Suella Broad, MD (Physical Medicine and Rehabilitation) Rondel Oh, MD (Ophthalmology) Katy Apo, MD (Ophthalmology)     Objective:    Physical Exam  Constitutional: She appears well-developed and well-nourished. No distress.  Cardiovascular: Normal rate, regular rhythm and normal heart sounds.   No murmur heard. Pulmonary/Chest: Effort normal and breath sounds normal. No respiratory distress.  Musculoskeletal: She exhibits no edema.    BP 138/74 mmHg  Pulse 70  Temp(Src) 97.7 F (36.5 C) (Oral)  Ht 5\' 4"  (1.626 m)  Wt 148 lb (67.132 kg)  BMI 25.39 kg/m2  SpO2 95% Wt Readings from Last 3 Encounters:  02/28/15 148 lb (67.132 kg)  09/23/14 148 lb (67.132 kg)  02/23/14 146 lb 12.8 oz (66.588 kg)     Lab Results  Component Value Date   WBC 6.2 02/23/2014   HGB 12.4 02/23/2014   HCT 37.7 02/23/2014   PLT 250.0 02/23/2014   GLUCOSE 99 02/23/2014   CHOL 190 07/03/2012   TRIG 110.0 07/03/2012   HDL 72.10 07/03/2012   LDLCALC 96 07/03/2012   ALT 14 02/23/2014   AST 19 02/23/2014   NA 137 02/23/2014   K 4.4 02/23/2014   CL 104 02/23/2014   CREATININE 0.7  02/23/2014   BUN 14 02/23/2014   CO2 28 02/23/2014   TSH 1.18 02/23/2014    US Soft Tissue Head/neck  01/14/2012   *RADIOLOGY REPORT*  Clinical Data: Possible thyroid goiter, follow-up of nodules  THYROID ULTRASOUND  Technique: Ultrasound examination of the thyroid gland and adjacent soft tissues was performed.  Comparison:  Ultrasound of the thyroid of 01/01/2005  Findings:  Right thyroid lobe:  3.7 x 0.8 x 1.2 cm.  (Previously 3.8 x 1.0 x 1.1 cm) Left thyroid lobe:  3.7 x 0.8 x 1.4 cm.  (Previously 3.3 x 0.9 x 1.2 cm). Isthmus:  1.6 mm in thickness.  Focal nodules:  There are multiple thyroid nodules bilaterally.  A solid nodule in the mid right lobe measures 8 x 6 x 4 mm. A solid nodule in the lower pole on the right measures 6 x 5 x 7 mm. A solid nodule in the left isthmus measures 7 x 3 x 7 mm.  The majority of the remainder of nodules are primarily hypoechoic and measure no more than 6 mm in diameter.  Lymphadenopathy:  None visualized.  IMPRESSION: Only small nodules are noted bilaterally of no more than 8 mm in diameter.  Original Report Authenticated By: Joretta Bachelor, M.D.      Assessment & Plan:   AWV/z00.00 - Today patient counseled on age appropriate routine health concerns for screening and prevention, each reviewed and up to date or declined. Immunizations reviewed and up to date or declined. Labs ordered and reviewed. Risk factors for depression reviewed and negative. Hearing function and visual acuity are intact. ADLs screened and addressed as needed. Functional ability and level of safety reviewed and appropriate. Education, counseling and referrals performed based on assessed risks today. Patient provided with a copy of personalized plan for preventive services.  Fatigue - nonspecific symptoms/exam - check screening labs  Problem List Items Addressed This Visit    Anxiety state    Started sertraline 05/2010- continued spouse stressors (EtOH abuse, retirement, depression) and son  in law Given increase flares and outbursts, will increase dose at this time support/counseling provided - to contact me for counseling referral if needed (has done same in past but not in past few years)      Relevant Medications   sertraline (ZOLOFT) 100 MG tablet   GOITER, MULTINODULAR   Hip dysplasia, acquired   Iron deficiency anemia    Long hx same - prior GI eval EGD 12/2005, colo 04/2004 unremarkable-  due for followup colo now - will re refer Reviewed daily oral iron  recheck levels ferritin and CBC now -   Lab Results  Component Value Date   WBC 6.2 02/23/2014   HGB 12.4 02/23/2014   HCT 37.7 02/23/2014   MCV 91.9 02/23/2014   PLT 250.0 02/23/2014   Lab Results  Component Value Date   FERRITIN 8.3* 02/23/2014        Relevant Orders   CBC with Differential/Platelet   Ferritin    Other Visit Diagnoses    Routine general  medical examination at a health care facility    -  Primary    Chronic fatigue        Relevant Orders    CBC with Differential/Platelet    Basic metabolic panel    Hepatic function panel    TSH    Special screening for malignant neoplasms, colon        Relevant Orders    Ambulatory referral to Gastroenterology        Gwendolyn Grant, MD

## 2015-02-28 NOTE — Patient Instructions (Addendum)
It was good to see you today.  We have reviewed your prior records including labs and tests today  Health Maintenance reviewed - all recommended immunizations and age-appropriate screenings are up-to-date.  Test(s) ordered today. Your results will be released to [place] (or called to you) after review, usually within 72hours after test completion. If any changes need to be made, you will be notified at that same time.  Medications reviewed and updated Increase sertraline to 122m daily No other changes recommended at this time.  we'll make referral to Gastroenterology for colon screening. Our office will contact you regarding appointment(s) once made.  Please schedule followup in 12 months for annual exam and labs, call sooner if problems.  Health Maintenance Adopting a healthy lifestyle and getting preventive care can go a long way to promote health and wellness. Talk with your health care provider about what schedule of regular examinations is right for you. This is a good chance for you to check in with your provider about disease prevention and staying healthy. In between checkups, there are plenty of things you can do on your own. Experts have done a lot of research about which lifestyle changes and preventive measures are most likely to keep you healthy. Ask your health care provider for more information. WEIGHT AND DIET  Eat a healthy diet  Be sure to include plenty of vegetables, fruits, low-fat dairy products, and lean protein.  Do not eat a lot of foods high in solid fats, added sugars, or salt.  Get regular exercise. This is one of the most important things you can do for your health.  Most adults should exercise for at least 150 minutes each week. The exercise should increase your heart rate and make you sweat (moderate-intensity exercise).  Most adults should also do strengthening exercises at least twice a week. This is in addition to the moderate-intensity exercise.   Maintain a healthy weight  Body mass index (BMI) is a measurement that can be used to identify possible weight problems. It estimates body fat based on height and weight. Your health care provider can help determine your BMI and help you achieve or maintain a healthy weight.  For females 289years of age and older:   A BMI below 18.5 is considered underweight.  A BMI of 18.5 to 24.9 is normal.  A BMI of 25 to 29.9 is considered overweight.  A BMI of 30 and above is considered obese.  Watch levels of cholesterol and blood lipids  You should start having your blood tested for lipids and cholesterol at 74years of age, then have this test every 5 years.  You may need to have your cholesterol levels checked more often if:  Your lipid or cholesterol levels are high.  You are older than 74years of age.  You are at high risk for heart disease.  CANCER SCREENING   Lung Cancer  Lung cancer screening is recommended for adults 510893years old who are at high risk for lung cancer because of a history of smoking.  A yearly low-dose CT scan of the lungs is recommended for people who:  Currently smoke.  Have quit within the past 15 years.  Have at least a 30-pack-year history of smoking. A pack year is smoking an average of one pack of cigarettes a day for 1 year.  Yearly screening should continue until it has been 15 years since you quit.  Yearly screening should stop if you develop a health problem that  would prevent you from having lung cancer treatment.  Breast Cancer  Practice breast self-awareness. This means understanding how your breasts normally appear and feel.  It also means doing regular breast self-exams. Let your health care provider know about any changes, no matter how small.  If you are in your 20s or 30s, you should have a clinical breast exam (CBE) by a health care provider every 1-3 years as part of a regular health exam.  If you are 37 or older, have a  CBE every year. Also consider having a breast X-ray (mammogram) every year.  If you have a family history of breast cancer, talk to your health care provider about genetic screening.  If you are at high risk for breast cancer, talk to your health care provider about having an MRI and a mammogram every year.  Breast cancer gene (BRCA) assessment is recommended for women who have family members with BRCA-related cancers. BRCA-related cancers include:  Breast.  Ovarian.  Tubal.  Peritoneal cancers.  Results of the assessment will determine the need for genetic counseling and BRCA1 and BRCA2 testing. Cervical Cancer Routine pelvic examinations to screen for cervical cancer are no longer recommended for nonpregnant women who are considered low risk for cancer of the pelvic organs (ovaries, uterus, and vagina) and who do not have symptoms. A pelvic examination may be necessary if you have symptoms including those associated with pelvic infections. Ask your health care provider if a screening pelvic exam is right for you.   The Pap test is the screening test for cervical cancer for women who are considered at risk.  If you had a hysterectomy for a problem that was not cancer or a condition that could lead to cancer, then you no longer need Pap tests.  If you are older than 65 years, and you have had normal Pap tests for the past 10 years, you no longer need to have Pap tests.  If you have had past treatment for cervical cancer or a condition that could lead to cancer, you need Pap tests and screening for cancer for at least 20 years after your treatment.  If you no longer get a Pap test, assess your risk factors if they change (such as having a new sexual partner). This can affect whether you should start being screened again.  Some women have medical problems that increase their chance of getting cervical cancer. If this is the case for you, your health care provider may recommend more  frequent screening and Pap tests.  The human papillomavirus (HPV) test is another test that may be used for cervical cancer screening. The HPV test looks for the virus that can cause cell changes in the cervix. The cells collected during the Pap test can be tested for HPV.  The HPV test can be used to screen women 84 years of age and older. Getting tested for HPV can extend the interval between normal Pap tests from three to five years.  An HPV test also should be used to screen women of any age who have unclear Pap test results.  After 74 years of age, women should have HPV testing as often as Pap tests.  Colorectal Cancer  This type of cancer can be detected and often prevented.  Routine colorectal cancer screening usually begins at 74 years of age and continues through 74 years of age.  Your health care provider may recommend screening at an earlier age if you have risk factors for colon cancer.  Your health care provider may also recommend using home test kits to check for hidden blood in the stool.  A small camera at the end of a tube can be used to examine your colon directly (sigmoidoscopy or colonoscopy). This is done to check for the earliest forms of colorectal cancer.  Routine screening usually begins at age 77.  Direct examination of the colon should be repeated every 5-10 years through 74 years of age. However, you may need to be screened more often if early forms of precancerous polyps or small growths are found. Skin Cancer  Check your skin from head to toe regularly.  Tell your health care provider about any new moles or changes in moles, especially if there is a change in a mole's shape or color.  Also tell your health care provider if you have a mole that is larger than the size of a pencil eraser.  Always use sunscreen. Apply sunscreen liberally and repeatedly throughout the day.  Protect yourself by wearing long sleeves, pants, a wide-brimmed hat, and sunglasses  whenever you are outside. HEART DISEASE, DIABETES, AND HIGH BLOOD PRESSURE   Have your blood pressure checked at least every 1-2 years. High blood pressure causes heart disease and increases the risk of stroke.  If you are between 18 years and 62 years old, ask your health care provider if you should take aspirin to prevent strokes.  Have regular diabetes screenings. This involves taking a blood sample to check your fasting blood sugar level.  If you are at a normal weight and have a low risk for diabetes, have this test once every three years after 74 years of age.  If you are overweight and have a high risk for diabetes, consider being tested at a younger age or more often. PREVENTING INFECTION  Hepatitis B  If you have a higher risk for hepatitis B, you should be screened for this virus. You are considered at high risk for hepatitis B if:  You were born in a country where hepatitis B is common. Ask your health care provider which countries are considered high risk.  Your parents were born in a high-risk country, and you have not been immunized against hepatitis B (hepatitis B vaccine).  You have HIV or AIDS.  You use needles to inject street drugs.  You live with someone who has hepatitis B.  You have had sex with someone who has hepatitis B.  You get hemodialysis treatment.  You take certain medicines for conditions, including cancer, organ transplantation, and autoimmune conditions. Hepatitis C  Blood testing is recommended for:  Everyone born from [birth year] through 1965.  Anyone with known risk factors for hepatitis C. Sexually transmitted infections (STIs)  You should be screened for sexually transmitted infections (STIs) including gonorrhea and chlamydia if:  You are sexually active and are younger than 74 years of age.  You are older than 74 years of age and your health care provider tells you that you are at risk for this type of infection.  Your sexual activity  has changed since you were last screened and you are at an increased risk for chlamydia or gonorrhea. Ask your health care provider if you are at risk.  If you do not have HIV, but are at risk, it may be recommended that you take a prescription medicine daily to prevent HIV infection. This is called pre-exposure prophylaxis (PrEP). You are considered at risk if:  You are sexually active and do not regularly use  condoms or know the HIV status of your partner(s).  You take drugs by injection.  You are sexually active with a partner who has HIV. Talk with your health care provider about whether you are at high risk of being infected with HIV. If you choose to begin PrEP, you should first be tested for HIV. You should then be tested every 3 months for as long as you are taking PrEP.  PREGNANCY   If you are premenopausal and you may become pregnant, ask your health care provider about preconception counseling.  If you may become pregnant, take 400 to 800 micrograms (mcg) of folic acid every day.  If you want to prevent pregnancy, talk to your health care provider about birth control (contraception). OSTEOPOROSIS AND MENOPAUSE   Osteoporosis is a disease in which the bones lose minerals and strength with aging. This can result in serious bone fractures. Your risk for osteoporosis can be identified using a bone density scan.  If you are 7 years of age or older, or if you are at risk for osteoporosis and fractures, ask your health care provider if you should be screened.  Ask your health care provider whether you should take a calcium or vitamin D supplement to lower your risk for osteoporosis.  Menopause may have certain physical symptoms and risks.  Hormone replacement therapy may reduce some of these symptoms and risks. Talk to your health care provider about whether hormone replacement therapy is right for you.  HOME CARE INSTRUCTIONS   Schedule regular health, dental, and eye  exams.  Stay current with your immunizations.   Do not use any tobacco products including cigarettes, chewing tobacco, or electronic cigarettes.  If you are pregnant, do not drink alcohol.  If you are breastfeeding, limit how much and how often you drink alcohol.  Limit alcohol intake to no more than 1 drink per day for nonpregnant women. One drink equals 12 ounces of beer, 5 ounces of wine, or 1 ounces of hard liquor.  Do not use street drugs.  Do not share needles.  Ask your health care provider for help if you need support or information about quitting drugs.  Tell your health care provider if you often feel depressed.  Tell your health care provider if you have ever been abused or do not feel safe at home. Document Released: 02/11/2011 Document Revised: 12/13/2013 Document Reviewed: 06/30/2013 [facility] Patient Information 2015 [place], [place]. This information is not intended to replace advice given to you by your health care provider. Make sure you discuss any questions you have with your health care provider.

## 2015-02-28 NOTE — Assessment & Plan Note (Signed)
Started sertraline 05/2010- continued spouse stressors (EtOH abuse, retirement, depression) and son in law Given increase flares and outbursts, will increase dose at this time support/counseling provided - to contact me for counseling referral if needed (has done same in past but not in past few years)

## 2015-02-28 NOTE — Assessment & Plan Note (Signed)
Long hx same - prior GI eval EGD 12/2005, colo 04/2004 unremarkable-  due for followup colo now - will re refer Reviewed daily oral iron  recheck levels ferritin and CBC now -   Lab Results  Component Value Date   WBC 6.2 02/23/2014   HGB 12.4 02/23/2014   HCT 37.7 02/23/2014   MCV 91.9 02/23/2014   PLT 250.0 02/23/2014   Lab Results  Component Value Date   FERRITIN 8.3* 02/23/2014

## 2015-02-28 NOTE — Progress Notes (Signed)
Pre visit review using our clinic review tool, if applicable. No additional management support is needed unless otherwise documented below in the visit note. 

## 2015-03-02 DIAGNOSIS — M6281 Muscle weakness (generalized): Secondary | ICD-10-CM | POA: Diagnosis not present

## 2015-03-02 DIAGNOSIS — M25651 Stiffness of right hip, not elsewhere classified: Secondary | ICD-10-CM | POA: Diagnosis not present

## 2015-03-02 DIAGNOSIS — M25551 Pain in right hip: Secondary | ICD-10-CM | POA: Diagnosis not present

## 2015-03-03 ENCOUNTER — Other Ambulatory Visit: Payer: Self-pay

## 2015-03-03 MED ORDER — GABAPENTIN 100 MG PO CAPS: 200.0000 mg | ORAL_CAPSULE | Freq: Every day | ORAL | Status: DC

## 2015-03-07 DIAGNOSIS — M25651 Stiffness of right hip, not elsewhere classified: Secondary | ICD-10-CM | POA: Diagnosis not present

## 2015-03-07 DIAGNOSIS — M6281 Muscle weakness (generalized): Secondary | ICD-10-CM | POA: Diagnosis not present

## 2015-03-07 DIAGNOSIS — M25551 Pain in right hip: Secondary | ICD-10-CM | POA: Diagnosis not present

## 2015-03-08 ENCOUNTER — Encounter: Payer: Self-pay | Admitting: Internal Medicine

## 2015-03-09 ENCOUNTER — Other Ambulatory Visit: Payer: Self-pay

## 2015-03-09 DIAGNOSIS — M25551 Pain in right hip: Secondary | ICD-10-CM | POA: Diagnosis not present

## 2015-03-09 DIAGNOSIS — M6281 Muscle weakness (generalized): Secondary | ICD-10-CM | POA: Diagnosis not present

## 2015-03-09 DIAGNOSIS — M25651 Stiffness of right hip, not elsewhere classified: Secondary | ICD-10-CM | POA: Diagnosis not present

## 2015-03-09 MED ORDER — OMEPRAZOLE 20 MG PO TBEC: 1.0000 | DELAYED_RELEASE_TABLET | Freq: Every day | ORAL | Status: DC

## 2015-03-21 DIAGNOSIS — M533 Sacrococcygeal disorders, not elsewhere classified: Secondary | ICD-10-CM | POA: Diagnosis not present

## 2015-03-21 DIAGNOSIS — M25551 Pain in right hip: Secondary | ICD-10-CM | POA: Diagnosis not present

## 2015-03-21 DIAGNOSIS — G894 Chronic pain syndrome: Secondary | ICD-10-CM | POA: Diagnosis not present

## 2015-03-21 DIAGNOSIS — M545 Low back pain: Secondary | ICD-10-CM | POA: Diagnosis not present

## 2015-03-21 DIAGNOSIS — M5136 Other intervertebral disc degeneration, lumbar region: Secondary | ICD-10-CM | POA: Diagnosis not present

## 2015-03-21 DIAGNOSIS — M6281 Muscle weakness (generalized): Secondary | ICD-10-CM | POA: Diagnosis not present

## 2015-03-23 DIAGNOSIS — M6281 Muscle weakness (generalized): Secondary | ICD-10-CM | POA: Diagnosis not present

## 2015-03-23 DIAGNOSIS — M545 Low back pain: Secondary | ICD-10-CM | POA: Diagnosis not present

## 2015-03-23 DIAGNOSIS — M25551 Pain in right hip: Secondary | ICD-10-CM | POA: Diagnosis not present

## 2015-03-29 DIAGNOSIS — H25041 Posterior subcapsular polar age-related cataract, right eye: Secondary | ICD-10-CM | POA: Diagnosis not present

## 2015-03-29 DIAGNOSIS — H2511 Age-related nuclear cataract, right eye: Secondary | ICD-10-CM | POA: Diagnosis not present

## 2015-03-29 DIAGNOSIS — H4011X2 Primary open-angle glaucoma, moderate stage: Secondary | ICD-10-CM | POA: Diagnosis not present

## 2015-03-29 DIAGNOSIS — Z961 Presence of intraocular lens: Secondary | ICD-10-CM | POA: Diagnosis not present

## 2015-03-30 DIAGNOSIS — M25551 Pain in right hip: Secondary | ICD-10-CM | POA: Diagnosis not present

## 2015-03-30 DIAGNOSIS — M545 Low back pain: Secondary | ICD-10-CM | POA: Diagnosis not present

## 2015-03-30 DIAGNOSIS — M6281 Muscle weakness (generalized): Secondary | ICD-10-CM | POA: Diagnosis not present

## 2015-03-30 DIAGNOSIS — M5136 Other intervertebral disc degeneration, lumbar region: Secondary | ICD-10-CM | POA: Diagnosis not present

## 2015-04-04 ENCOUNTER — Encounter: Payer: Self-pay | Admitting: Internal Medicine

## 2015-04-04 DIAGNOSIS — M25551 Pain in right hip: Secondary | ICD-10-CM | POA: Diagnosis not present

## 2015-04-04 DIAGNOSIS — M6281 Muscle weakness (generalized): Secondary | ICD-10-CM | POA: Diagnosis not present

## 2015-04-04 DIAGNOSIS — M545 Low back pain: Secondary | ICD-10-CM | POA: Diagnosis not present

## 2015-05-23 DIAGNOSIS — G894 Chronic pain syndrome: Secondary | ICD-10-CM | POA: Diagnosis not present

## 2015-05-23 DIAGNOSIS — M545 Low back pain: Secondary | ICD-10-CM | POA: Diagnosis not present

## 2015-05-23 DIAGNOSIS — M4156 Other secondary scoliosis, lumbar region: Secondary | ICD-10-CM | POA: Diagnosis not present

## 2015-05-23 DIAGNOSIS — G8929 Other chronic pain: Secondary | ICD-10-CM | POA: Diagnosis not present

## 2015-06-01 DIAGNOSIS — M1611 Unilateral primary osteoarthritis, right hip: Secondary | ICD-10-CM | POA: Diagnosis not present

## 2015-06-06 DIAGNOSIS — Z23 Encounter for immunization: Secondary | ICD-10-CM | POA: Diagnosis not present

## 2015-06-07 ENCOUNTER — Ambulatory Visit (AMBULATORY_SURGERY_CENTER): Payer: Self-pay

## 2015-06-07 VITALS — Ht 64.0 in | Wt 146.0 lb

## 2015-06-07 DIAGNOSIS — Z1211 Encounter for screening for malignant neoplasm of colon: Secondary | ICD-10-CM

## 2015-06-07 MED ORDER — NA SULFATE-K SULFATE-MG SULF 17.5-3.13-1.6 GM/177ML PO SOLN: 1.0000 | Freq: Once | ORAL | Status: DC

## 2015-06-07 NOTE — Progress Notes (Signed)
No egg or soy allergies Not on home 02 No previous anesthesia complications No diet or weight loss meds 

## 2015-06-20 ENCOUNTER — Telehealth: Payer: Self-pay | Admitting: Internal Medicine

## 2015-06-20 DIAGNOSIS — M25551 Pain in right hip: Secondary | ICD-10-CM | POA: Diagnosis not present

## 2015-06-20 DIAGNOSIS — M1990 Unspecified osteoarthritis, unspecified site: Secondary | ICD-10-CM | POA: Diagnosis not present

## 2015-06-20 DIAGNOSIS — M5136 Other intervertebral disc degeneration, lumbar region: Secondary | ICD-10-CM | POA: Diagnosis not present

## 2015-06-20 DIAGNOSIS — G894 Chronic pain syndrome: Secondary | ICD-10-CM | POA: Diagnosis not present

## 2015-06-20 NOTE — Telephone Encounter (Signed)
yes

## 2015-06-20 NOTE — Telephone Encounter (Signed)
Left message for patient to call back to make next appointment.

## 2015-06-20 NOTE — Telephone Encounter (Signed)
Would like to transfer to [place] from [place].  Please advise.

## 2015-06-21 ENCOUNTER — Ambulatory Visit (AMBULATORY_SURGERY_CENTER): Payer: Medicare Other | Admitting: Internal Medicine

## 2015-06-21 ENCOUNTER — Encounter: Payer: Self-pay | Admitting: Internal Medicine

## 2015-06-21 VITALS — BP 128/79 | HR 68 | Temp 96.9°F | Resp 16 | Ht 64.0 in | Wt 148.0 lb

## 2015-06-21 DIAGNOSIS — D123 Benign neoplasm of transverse colon: Secondary | ICD-10-CM

## 2015-06-21 DIAGNOSIS — Z1211 Encounter for screening for malignant neoplasm of colon: Secondary | ICD-10-CM | POA: Diagnosis not present

## 2015-06-21 MED ORDER — SODIUM CHLORIDE 0.9 % IV SOLN: 500.0000 mL | INTRAVENOUS | Status: DC

## 2015-06-21 NOTE — Progress Notes (Signed)
A/ox3 pleased with MAC, report to Penny RN 

## 2015-06-21 NOTE — Progress Notes (Signed)
Called to room to assist during endoscopic procedure.  Patient ID and intended procedure confirmed with present staff. Received instructions for my participation in the procedure from the performing physician.  

## 2015-06-21 NOTE — Patient Instructions (Signed)
YOU HAD AN ENDOSCOPIC PROCEDURE TODAY AT THE [facility]:   Refer to the procedure report that was given to you for any specific questions about what was found during the examination.  If the procedure report does not answer your questions, please call your gastroenterologist to clarify.  If you requested that your care partner not be given the details of your procedure findings, then the procedure report has been included in a sealed envelope for you to review at your convenience later.  YOU SHOULD EXPECT: Some feelings of bloating in the abdomen. Passage of more gas than usual.  Walking can help get rid of the air that was put into your GI tract during the procedure and reduce the bloating. If you had a lower endoscopy (such as a colonoscopy or flexible sigmoidoscopy) you may notice spotting of blood in your stool or on the toilet paper. If you underwent a bowel prep for your procedure, you may not have a normal bowel movement for a few days.  Please Note:  You might notice some irritation and congestion in your nose or some drainage.  This is from the oxygen used during your procedure.  There is no need for concern and it should clear up in a day or so.  SYMPTOMS TO REPORT IMMEDIATELY:   Following lower endoscopy (colonoscopy or flexible sigmoidoscopy):  Excessive amounts of blood in the stool  Significant tenderness or worsening of abdominal pains  Swelling of the abdomen that is new, acute  Fever of 100F or higher    For urgent or emergent issues, a gastroenterologist can be reached at any hour by calling [phone].   DIET: Your first meal following the procedure should be a small meal and then it is ok to progress to your normal diet. Heavy or fried foods are harder to digest and may make you feel nauseous or bloated.  Likewise, meals heavy in dairy and vegetables can increase bloating.  Drink plenty of fluids but you should avoid alcoholic beverages for 24  hours.  ACTIVITY:  You should plan to take it easy for the rest of today and you should NOT DRIVE or use heavy machinery until tomorrow (because of the sedation medicines used during the test).    FOLLOW UP: Our staff will call the number listed on your records the next business day following your procedure to check on you and address any questions or concerns that you may have regarding the information given to you following your procedure. If we do not reach you, we will leave a message.  However, if you are feeling well and you are not experiencing any problems, there is no need to return our call.  We will assume that you have returned to your regular daily activities without incident.  If any biopsies were taken you will be contacted by phone or by letter within the next 1-3 weeks.  Please call us at [phone] if you have not heard about the biopsies in 3 weeks.    SIGNATURES/CONFIDENTIALITY: You and/or your care partner have signed paperwork which will be entered into your electronic medical record.  These signatures attest to the fact that that the information above on your After Visit Summary has been reviewed and is understood.  Full responsibility of the confidentiality of this discharge information lies with you and/or your care-partner.   INFORMATION ON POLYPS GIVEN TO YOU TODAY    

## 2015-06-21 NOTE — Op Note (Signed)
[place]  [address] [place], [id number]   COLONOSCOPY PROCEDURE REPORT  PATIENT: Linda, [name]  MR#: [id number] BIRTHDATE: 10-02-1940 , 74  yrs. old GENDER: female ENDOSCOPIST: Jerene Bears, MD REFERRED CB:[id number] R Patterson, M.D. PROCEDURE DATE:  06/21/2015 PROCEDURE:   Colonoscopy, screening and Colonoscopy with snare polypectomy First Screening Colonoscopy - Avg.  risk and is 50 yrs.  old or older - No.  Prior Negative Screening - Now for repeat screening. N/A  History of Adenoma - Now for follow-up colonoscopy & has been > or = to 3 yrs.  N/A  Polyps removed today? Yes ASA CLASS:   Class II INDICATIONS:Screening for colonic neoplasia, average risk, last colonoscopy 2005 was normal, Dr. Sharlett Iles. MEDICATIONS: Monitored anesthesia care and Propofol 140 mg IV  DESCRIPTION OF PROCEDURE:   After the risks benefits and alternatives of the procedure were thoroughly explained, informed consent was obtained.  The digital rectal exam revealed no rectal mass.   The LB [id number]  endoscope was introduced through the anus and advanced to the cecum, which was identified by both the appendix and ileocecal valve. No adverse events experienced. The quality of the prep was excellent.  (Suprep was used)  The instrument was then slowly withdrawn as the colon was fully examined. Estimated blood loss is zero unless otherwise noted in this procedure report.   COLON FINDINGS: Two sessile polyps ranging between 3-39mm in size were found in the transverse colon.  Polypectomies were performed with a cold snare.  The resection was complete, the polyp tissue was completely retrieved and sent to histology.   The examination was otherwise normal.  Retroflexed views revealed internal hemorrhoids. The time to cecum = 4.7 Withdrawal time = 12.9   The scope was withdrawn and the procedure completed. COMPLICATIONS: There were no immediate complications.  ENDOSCOPIC  IMPRESSION: 1.   Two sessile polyps ranging between 3-77mm in size were found in the transverse colon; polypectomies were performed with a cold snare 2.   The examination was otherwise normal  RECOMMENDATIONS: 1.  Await pathology results 2.  Timing of repeat colonoscopy will be determined by pathology findings. 3.  You will receive a letter within 1-2 weeks with the results of your biopsy as well as final recommendations.  Please call my office if you have not received a letter after 3 weeks.  eSigned:  Jerene Bears, MD 06/21/2015 10:13 AM   cc: Janith Lima, MD and The Patient

## 2015-06-22 ENCOUNTER — Telehealth: Payer: Self-pay | Admitting: *Deleted

## 2015-06-22 NOTE — Telephone Encounter (Signed)
2 Follow up Call-  Call back number 06/21/2015  Post procedure Call Back phone  # [phone]  Permission to leave phone message Yes     Patient questions:  Do you have a fever, pain , or abdominal swelling? No. Pain Score  0 *  Have you tolerated food without any problems? Yes.    Have you been able to return to your normal activities? Yes.    Do you have any questions about your discharge instructions: Diet   No. Medications  No. Follow up visit  No.  Do you have questions or concerns about your Care? No.  Actions: * If pain score is 4 or above: No action needed, pain <4.  Pt did c/o sinuses breaking loose from oxygen and her nose has been running non stop.

## 2015-06-27 ENCOUNTER — Encounter: Payer: Self-pay | Admitting: Internal Medicine

## 2015-06-28 DIAGNOSIS — M1611 Unilateral primary osteoarthritis, right hip: Secondary | ICD-10-CM | POA: Diagnosis not present

## 2015-06-28 LAB — HM COLONOSCOPY

## 2015-06-28 NOTE — Addendum Note (Signed)
Addended by: Janith Lima on: 06/28/2015 09:33 AM   Modules accepted: Miquel Dunn

## 2015-07-14 DIAGNOSIS — M1611 Unilateral primary osteoarthritis, right hip: Secondary | ICD-10-CM | POA: Diagnosis not present

## 2015-07-20 ENCOUNTER — Encounter: Payer: Self-pay | Admitting: Internal Medicine

## 2015-07-20 ENCOUNTER — Ambulatory Visit (INDEPENDENT_AMBULATORY_CARE_PROVIDER_SITE_OTHER): Payer: Medicare Other | Admitting: Internal Medicine

## 2015-07-20 VITALS — BP 132/70 | HR 73 | Temp 98.0°F | Resp 14 | Ht 64.0 in | Wt 144.0 lb

## 2015-07-20 DIAGNOSIS — Z0000 Encounter for general adult medical examination without abnormal findings: Secondary | ICD-10-CM

## 2015-07-20 NOTE — Progress Notes (Signed)
Pre visit review using our clinic review tool, if applicable. No additional management support is needed unless otherwise documented below in the visit note. 

## 2015-07-22 ENCOUNTER — Encounter: Payer: Self-pay | Admitting: Internal Medicine

## 2015-07-22 DIAGNOSIS — Z0000 Encounter for general adult medical examination without abnormal findings: Secondary | ICD-10-CM | POA: Insufficient documentation

## 2015-07-22 NOTE — Assessment & Plan Note (Signed)
Colonoscopy last month, bone density up to date. Immunizations up to date including flu. Counseled her about activity to help keep her joints from aching. 10 year screening recommendations given at visit.

## 2015-07-22 NOTE — Progress Notes (Signed)
   Subjective:    Patient ID: Linda [name], female    DOB: 15-Feb-1941, 74 y.o.   MRN: [id number]  HPI Here for medicare wellness, no new complaints. Please see A/P for status and treatment of chronic medical problems.   Diet: heart healthy Physical activity: sedentary Depression/mood screen: negative Hearing: intact to whispered voice Visual acuity: grossly normal, performs annual eye exam  ADLs: capable Fall risk: none Home safety: good Cognitive evaluation: intact to orientation, naming, recall and repetition EOL planning: adv directives discussed  I have personally reviewed and have noted 1. The patient's medical and social history - reviewed today no changes 2. Their use of alcohol, tobacco or illicit drugs 3. Their current medications and supplements 4. The patient's functional ability including ADL's, fall risks, home safety risks and hearing or visual impairment. 5. Diet and physical activities 6. Evidence for depression or mood disorders 7. Care team reviewed and updated (available in snapshot)  Review of Systems  Constitutional: Negative for fever, activity change, appetite change and fatigue.  HENT: Negative.   Eyes: Negative.   Respiratory: Negative for cough, chest tightness and shortness of breath.   Cardiovascular: Negative for chest pain, palpitations and leg swelling.  Gastrointestinal: Negative for abdominal pain, diarrhea, constipation and abdominal distention.  Musculoskeletal: Negative.   Skin: Negative.   Neurological: Negative.   Psychiatric/Behavioral: Negative.       Objective:   Physical Exam  Constitutional: She is oriented to person, place, and time. She appears well-developed and well-nourished.  HENT:  Head: Normocephalic and atraumatic.  Eyes: EOM are normal.  Neck: Normal range of motion.  Cardiovascular: Normal rate and regular rhythm.   Pulmonary/Chest: Effort normal and breath sounds normal. No respiratory distress. She has no  wheezes.  Abdominal: Soft. Bowel sounds are normal. She exhibits no distension. There is no tenderness.  Musculoskeletal: She exhibits no edema.  Neurological: She is alert and oriented to person, place, and time.  Skin: Skin is warm and dry.  Psychiatric: She has a normal mood and affect.   Filed Vitals:   07/20/15 1306  BP: 132/70  Pulse: 73  Temp: 98 F (36.7 C)  TempSrc: Oral  Resp: 14  Height: 5\' 4"  (1.626 m)  Weight: 144 lb (65.318 kg)  SpO2: 95%      Assessment & Plan:

## 2015-07-26 DIAGNOSIS — M1611 Unilateral primary osteoarthritis, right hip: Secondary | ICD-10-CM | POA: Diagnosis not present

## 2015-08-17 DIAGNOSIS — M1611 Unilateral primary osteoarthritis, right hip: Secondary | ICD-10-CM | POA: Diagnosis not present

## 2015-08-21 DIAGNOSIS — H401134 Primary open-angle glaucoma, bilateral, indeterminate stage: Secondary | ICD-10-CM | POA: Diagnosis not present

## 2015-08-23 DIAGNOSIS — H903 Sensorineural hearing loss, bilateral: Secondary | ICD-10-CM | POA: Diagnosis not present

## 2015-09-15 ENCOUNTER — Encounter: Payer: Self-pay | Admitting: Internal Medicine

## 2015-10-17 DIAGNOSIS — M25511 Pain in right shoulder: Secondary | ICD-10-CM | POA: Diagnosis not present

## 2015-10-17 DIAGNOSIS — M7541 Impingement syndrome of right shoulder: Secondary | ICD-10-CM | POA: Diagnosis not present

## 2015-11-15 DIAGNOSIS — M7541 Impingement syndrome of right shoulder: Secondary | ICD-10-CM | POA: Diagnosis not present

## 2016-01-05 DIAGNOSIS — M1611 Unilateral primary osteoarthritis, right hip: Secondary | ICD-10-CM | POA: Diagnosis not present

## 2016-01-18 ENCOUNTER — Ambulatory Visit: Payer: PRIVATE HEALTH INSURANCE | Admitting: Internal Medicine

## 2016-01-18 ENCOUNTER — Ambulatory Visit (INDEPENDENT_AMBULATORY_CARE_PROVIDER_SITE_OTHER): Payer: Medicare Other | Admitting: Internal Medicine

## 2016-01-18 ENCOUNTER — Encounter: Payer: Self-pay | Admitting: Internal Medicine

## 2016-01-18 VITALS — BP 134/52 | HR 74 | Temp 98.4°F | Resp 16 | Ht 64.0 in | Wt 146.0 lb

## 2016-01-18 DIAGNOSIS — E042 Nontoxic multinodular goiter: Secondary | ICD-10-CM

## 2016-01-18 DIAGNOSIS — E049 Nontoxic goiter, unspecified: Secondary | ICD-10-CM

## 2016-01-18 DIAGNOSIS — M159 Polyosteoarthritis, unspecified: Secondary | ICD-10-CM

## 2016-01-18 DIAGNOSIS — M150 Primary generalized (osteo)arthritis: Secondary | ICD-10-CM

## 2016-01-18 DIAGNOSIS — E789 Disorder of lipoprotein metabolism, unspecified: Secondary | ICD-10-CM | POA: Diagnosis not present

## 2016-01-18 NOTE — Assessment & Plan Note (Signed)
Not evident on exam, checking TSH today, not symptomatic. No problems swallowing.

## 2016-01-18 NOTE — Progress Notes (Signed)
   Subjective:    Patient ID: Curlene Labrum, female    DOB: 08-27-1940, 75 y.o.   MRN: [id number]  HPI The patient is a 75 YO female coming in for follow up of her GERD. The omeprazole is still doing well. No concerns or problems. No heartburn. Knows which dietary foods to avoid. She is also having some arthritis pains in her back. She is doing water aerobics several times per week which helps with her back pain.   Review of Systems  Constitutional: Negative for fever, activity change, appetite change and fatigue.  Respiratory: Negative for cough, chest tightness and shortness of breath.   Cardiovascular: Negative for chest pain, palpitations and leg swelling.  Gastrointestinal: Negative for abdominal pain, diarrhea, constipation and abdominal distention.  Musculoskeletal: Negative.   Skin: Negative.   Neurological: Negative.       Objective:   Physical Exam  Constitutional: She is oriented to person, place, and time. She appears well-developed and well-nourished.  HENT:  Head: Normocephalic and atraumatic.  Eyes: EOM are normal.  Neck: Normal range of motion.  Cardiovascular: Normal rate and regular rhythm.   Pulmonary/Chest: Effort normal and breath sounds normal. No respiratory distress. She has no wheezes.  Abdominal: Soft. Bowel sounds are normal. She exhibits no distension. There is no tenderness.  Musculoskeletal: She exhibits no edema.  Neurological: She is alert and oriented to person, place, and time.  Skin: Skin is warm and dry.   Filed Vitals:   01/18/16 1312  BP: 134/52  Pulse: 74  Temp: 98.4 F (36.9 C)  Resp: 16  Height: 5\' 4"  (1.626 m)  Weight: 146 lb (66.225 kg)  SpO2: 95%      Assessment & Plan:

## 2016-01-18 NOTE — Assessment & Plan Note (Signed)
Encouraged to continue with water aerobics.

## 2016-01-18 NOTE — Progress Notes (Signed)
Pre visit review using our clinic review tool, if applicable. No additional management support is needed unless otherwise documented below in the visit note. 

## 2016-01-18 NOTE — Patient Instructions (Signed)
We will check the labs today and call you back with the results.  We do not need to change any medicines today.

## 2016-02-01 DIAGNOSIS — M1611 Unilateral primary osteoarthritis, right hip: Secondary | ICD-10-CM | POA: Diagnosis not present

## 2016-02-01 DIAGNOSIS — M545 Low back pain: Secondary | ICD-10-CM | POA: Diagnosis not present

## 2016-02-01 DIAGNOSIS — M5136 Other intervertebral disc degeneration, lumbar region: Secondary | ICD-10-CM | POA: Diagnosis not present

## 2016-02-01 DIAGNOSIS — G894 Chronic pain syndrome: Secondary | ICD-10-CM | POA: Diagnosis not present

## 2016-02-15 DIAGNOSIS — M8589 Other specified disorders of bone density and structure, multiple sites: Secondary | ICD-10-CM | POA: Diagnosis not present

## 2016-02-15 DIAGNOSIS — Z1231 Encounter for screening mammogram for malignant neoplasm of breast: Secondary | ICD-10-CM | POA: Diagnosis not present

## 2016-02-15 LAB — HM DEXA SCAN: HM DEXA SCAN: -1.7

## 2016-02-21 DIAGNOSIS — N63 Unspecified lump in breast: Secondary | ICD-10-CM | POA: Diagnosis not present

## 2016-02-21 LAB — HM MAMMOGRAPHY

## 2016-02-28 ENCOUNTER — Other Ambulatory Visit: Payer: Self-pay | Admitting: Radiology

## 2016-02-28 DIAGNOSIS — N63 Unspecified lump in breast: Secondary | ICD-10-CM | POA: Diagnosis not present

## 2016-02-28 DIAGNOSIS — N6012 Diffuse cystic mastopathy of left breast: Secondary | ICD-10-CM | POA: Diagnosis not present

## 2016-02-29 ENCOUNTER — Encounter: Payer: PRIVATE HEALTH INSURANCE | Admitting: Internal Medicine

## 2016-03-01 ENCOUNTER — Encounter: Payer: Self-pay | Admitting: Geriatric Medicine

## 2016-03-07 ENCOUNTER — Other Ambulatory Visit: Payer: Self-pay | Admitting: *Deleted

## 2016-03-07 MED ORDER — SERTRALINE HCL 100 MG PO TABS: 100.0000 mg | ORAL_TABLET | Freq: Every day | ORAL | 1 refills | Status: DC

## 2016-03-12 ENCOUNTER — Encounter: Payer: Self-pay | Admitting: Internal Medicine

## 2016-03-12 DIAGNOSIS — H903 Sensorineural hearing loss, bilateral: Secondary | ICD-10-CM | POA: Insufficient documentation

## 2016-03-14 ENCOUNTER — Encounter: Payer: Self-pay | Admitting: Internal Medicine

## 2016-04-11 ENCOUNTER — Other Ambulatory Visit: Payer: Self-pay

## 2016-04-12 DIAGNOSIS — Z961 Presence of intraocular lens: Secondary | ICD-10-CM | POA: Diagnosis not present

## 2016-04-12 DIAGNOSIS — H524 Presbyopia: Secondary | ICD-10-CM | POA: Diagnosis not present

## 2016-04-12 DIAGNOSIS — H25041 Posterior subcapsular polar age-related cataract, right eye: Secondary | ICD-10-CM | POA: Diagnosis not present

## 2016-04-12 DIAGNOSIS — H401132 Primary open-angle glaucoma, bilateral, moderate stage: Secondary | ICD-10-CM | POA: Diagnosis not present

## 2016-04-29 DIAGNOSIS — M5136 Other intervertebral disc degeneration, lumbar region: Secondary | ICD-10-CM | POA: Diagnosis not present

## 2016-04-30 ENCOUNTER — Other Ambulatory Visit: Payer: Self-pay | Admitting: Internal Medicine

## 2016-05-02 DIAGNOSIS — M5136 Other intervertebral disc degeneration, lumbar region: Secondary | ICD-10-CM | POA: Diagnosis not present

## 2016-05-07 DIAGNOSIS — M5136 Other intervertebral disc degeneration, lumbar region: Secondary | ICD-10-CM | POA: Diagnosis not present

## 2016-05-13 DIAGNOSIS — H401134 Primary open-angle glaucoma, bilateral, indeterminate stage: Secondary | ICD-10-CM | POA: Diagnosis not present

## 2016-05-15 DIAGNOSIS — M5136 Other intervertebral disc degeneration, lumbar region: Secondary | ICD-10-CM | POA: Diagnosis not present

## 2016-05-17 DIAGNOSIS — M5136 Other intervertebral disc degeneration, lumbar region: Secondary | ICD-10-CM | POA: Diagnosis not present

## 2016-05-21 DIAGNOSIS — M5136 Other intervertebral disc degeneration, lumbar region: Secondary | ICD-10-CM | POA: Diagnosis not present

## 2016-05-23 DIAGNOSIS — M5136 Other intervertebral disc degeneration, lumbar region: Secondary | ICD-10-CM | POA: Diagnosis not present

## 2016-05-27 DIAGNOSIS — M5136 Other intervertebral disc degeneration, lumbar region: Secondary | ICD-10-CM | POA: Diagnosis not present

## 2016-05-29 DIAGNOSIS — M5136 Other intervertebral disc degeneration, lumbar region: Secondary | ICD-10-CM | POA: Diagnosis not present

## 2016-06-03 DIAGNOSIS — M5136 Other intervertebral disc degeneration, lumbar region: Secondary | ICD-10-CM | POA: Diagnosis not present

## 2016-06-06 DIAGNOSIS — M5136 Other intervertebral disc degeneration, lumbar region: Secondary | ICD-10-CM | POA: Diagnosis not present

## 2016-06-06 DIAGNOSIS — G894 Chronic pain syndrome: Secondary | ICD-10-CM | POA: Diagnosis not present

## 2016-06-06 DIAGNOSIS — M545 Low back pain: Secondary | ICD-10-CM | POA: Diagnosis not present

## 2016-06-19 DIAGNOSIS — Z23 Encounter for immunization: Secondary | ICD-10-CM | POA: Diagnosis not present

## 2016-07-19 ENCOUNTER — Ambulatory Visit: Payer: PRIVATE HEALTH INSURANCE | Admitting: Internal Medicine

## 2016-08-15 DIAGNOSIS — Z6826 Body mass index (BMI) 26.0-26.9, adult: Secondary | ICD-10-CM | POA: Diagnosis not present

## 2016-08-15 DIAGNOSIS — M549 Dorsalgia, unspecified: Secondary | ICD-10-CM | POA: Diagnosis not present

## 2016-08-15 DIAGNOSIS — M4150 Other secondary scoliosis, site unspecified: Secondary | ICD-10-CM | POA: Diagnosis not present

## 2016-08-15 DIAGNOSIS — R030 Elevated blood-pressure reading, without diagnosis of hypertension: Secondary | ICD-10-CM | POA: Diagnosis not present

## 2016-08-21 ENCOUNTER — Ambulatory Visit: Payer: PRIVATE HEALTH INSURANCE

## 2016-08-22 ENCOUNTER — Encounter: Payer: Self-pay | Admitting: Internal Medicine

## 2016-08-22 ENCOUNTER — Ambulatory Visit (INDEPENDENT_AMBULATORY_CARE_PROVIDER_SITE_OTHER): Payer: Medicare Other | Admitting: Internal Medicine

## 2016-08-22 ENCOUNTER — Other Ambulatory Visit (INDEPENDENT_AMBULATORY_CARE_PROVIDER_SITE_OTHER): Payer: Medicare Other

## 2016-08-22 ENCOUNTER — Other Ambulatory Visit: Payer: Self-pay | Admitting: Neurological Surgery

## 2016-08-22 ENCOUNTER — Other Ambulatory Visit: Payer: Medicare Other

## 2016-08-22 VITALS — BP 156/72 | HR 97 | Temp 97.7°F | Resp 12 | Ht 64.0 in | Wt 151.0 lb

## 2016-08-22 DIAGNOSIS — Z0000 Encounter for general adult medical examination without abnormal findings: Secondary | ICD-10-CM

## 2016-08-22 DIAGNOSIS — F411 Generalized anxiety disorder: Secondary | ICD-10-CM

## 2016-08-22 DIAGNOSIS — K219 Gastro-esophageal reflux disease without esophagitis: Secondary | ICD-10-CM

## 2016-08-22 DIAGNOSIS — E049 Nontoxic goiter, unspecified: Secondary | ICD-10-CM

## 2016-08-22 DIAGNOSIS — M4150 Other secondary scoliosis, site unspecified: Principal | ICD-10-CM

## 2016-08-22 DIAGNOSIS — E789 Disorder of lipoprotein metabolism, unspecified: Secondary | ICD-10-CM | POA: Diagnosis not present

## 2016-08-22 LAB — COMPREHENSIVE METABOLIC PANEL
ALK PHOS: 71 U/L (ref 39–117)
ALT: 14 U/L (ref 0–35)
AST: 16 U/L (ref 0–37)
Albumin: 4.6 g/dL (ref 3.5–5.2)
BILIRUBIN TOTAL: 0.3 mg/dL (ref 0.2–1.2)
BUN: 17 mg/dL (ref 6–23)
CALCIUM: 9.4 mg/dL (ref 8.4–10.5)
CO2: 29 mEq/L (ref 19–32)
Chloride: 101 mEq/L (ref 96–112)
Creatinine, Ser: 0.68 mg/dL (ref 0.40–1.20)
GFR: 89.41 mL/min (ref >60.00)
Glucose, Bld: 96 mg/dL (ref 70–99)
Potassium: 4.3 mEq/L (ref 3.5–5.1)
Sodium: 139 mEq/L (ref 135–145)
TOTAL PROTEIN: 6.9 g/dL (ref 6.0–8.3)

## 2016-08-22 LAB — LIPID PANEL
CHOLESTEROL: 192 mg/dL (ref 0–200)
HDL: 62.1 mg/dL (ref >39.00)
NonHDL: 129.86
TRIGLYCERIDES: 287 mg/dL — AB (ref 0.0–149.0)
Total CHOL/HDL Ratio: 3
VLDL: 57.4 mg/dL — ABNORMAL HIGH (ref 0.0–40.0)

## 2016-08-22 LAB — TSH: TSH: 1.08 u[IU]/mL (ref 0.35–4.50)

## 2016-08-22 LAB — LDL CHOLESTEROL, DIRECT: Direct LDL: 101 mg/dL

## 2016-08-22 NOTE — Progress Notes (Signed)
Pre visit review using our clinic review tool, if applicable. No additional management support is needed unless otherwise documented below in the visit note. 

## 2016-08-22 NOTE — Progress Notes (Signed)
   Subjective:    Patient ID: Linda [name], female    DOB: 03-14-1941, 76 y.o.   MRN: [id number]  HPI The patient is a 76 YO female coming in for follow up of her cholesterol levels. She did not get the labs done after last visit. She has been working on some exercise but diet has been different over the holidays so she is not sure how it will be. She is also following up on her depression. She is taking zoloft and this is doing well. No side effects and no new stressors to worsen her mood.   Review of Systems  Constitutional: Negative.   Respiratory: Negative.   Cardiovascular: Negative.   Gastrointestinal: Negative.   Musculoskeletal: Negative.   Psychiatric/Behavioral: Negative.       Objective:   Physical Exam  Constitutional: She is oriented to person, place, and time. She appears well-developed and well-nourished.  HENT:  Head: Normocephalic and atraumatic.  Eyes: EOM are normal.  Neck: Normal range of motion.  Cardiovascular: Normal rate and regular rhythm.   Pulmonary/Chest: Effort normal and breath sounds normal.  Abdominal: Soft. She exhibits no distension. There is no tenderness. There is no rebound.  Neurological: She is alert and oriented to person, place, and time.  Skin: Skin is warm and dry.  Psychiatric: She has a normal mood and affect.   Vitals:   08/22/16 1436  BP: (!) 156/72  Pulse: 97  Resp: 12  Temp: 97.7 F (36.5 C)  TempSrc: Oral  SpO2: 99%  Weight: 151 lb (68.5 kg)  Height: 5\' 4"  (1.626 m)      Assessment & Plan:

## 2016-08-22 NOTE — Progress Notes (Signed)
Subjective:   Linda [name] is a 76 y.o. female who presents for Medicare Annual (Subsequent) preventive examination.  The Patient was informed that the wellness visit is to identify future health risk and educate and initiate measures that can reduce risk for increased disease through the lifespan.    Review of Systems:  No ROS.  Medicare Wellness Visit.  Cardiac Risk Factors include: advanced age (>65men, >52 women);family history of premature cardiovascular disease   Sleep patterns: sleeps about 7-8 hours.   Home Safety/Smoke Alarms:  Smoke detectors and security in place.  Living environment; residence and Firearm Safety: Lives with husband in 2 story home. Uses rail with stairs. Firearms locked away.  Seat Belt Safety/Bike Helmet: Wears seat belt.   Counseling:   Eye Exam-Last exam 03/2016, yearly by [facility]. Glaucoma --Leota Sauers, 05/2016  Dental-Last exam 06/2016, every 6 months [facility]  Female:   Pap-12/16/2007       Mammo-02/21/2016. Biopsy 02/28/16, benign. F/U next month.        Dexa scan-02/08/2014, low bone mass. [facility]  Will repeat with mammo in 02/2017.     CCS-colonoscopy 06/21/2015, polyp. Recall 5 years.      Objective:     Vitals: BP (!) 156/72 (BP Location: Left Arm, Patient Position: Sitting, Cuff Size: Normal)   Pulse 97   Temp 97.7 F (36.5 C) (Oral)   Resp 12   Ht 5\' 4"  (1.626 m)   Wt 151 lb (68.5 kg)   SpO2 99%   BMI 25.92 kg/m   Body mass index is 25.92 kg/m.   Tobacco History  Smoking Status  . Former Smoker  . Quit date: 08/12/1988  Smokeless Tobacco  . Never Used     Counseling given: Not Answered   Past Medical History:  Diagnosis Date  . ALLERGIC RHINITIS   . Allergy   . ANEMIA-IRON DEFICIENCY   . Anxiety state, unspecified   . Carpal tunnel syndrome    pt denies   . COLONIC POLYPS, HX OF 2007  . GERD   . GLAUCOMA   . GOITER, MULTINODULAR    on [place] 2006, unchanged 6/13 with nodules all <53mm  . HIATAL HERNIA WITH  REFLUX   . Hip dysplasia, acquired    B sx; eval at [facility] for same 01/2015 -ongoing PT  . OSTEOARTHRITIS   . TOBACCO USE, QUIT    Past Surgical History:  Procedure Laterality Date  . BREAST SURGERY  1960   Left breast cyst removed no complications  . COLONOSCOPY  2005  . EYE SURGERY  1991-1992   both eyes  . EYE SURGERY Left 2014   shunt behind left eye  . Right shoulder aurgery  10/2008   Family History  Problem Relation Age of Onset  . Diabetes Mother   . Heart disease Mother   . Arthritis Mother   . Hypertension Mother   . Arthritis Father   . Colon cancer Paternal Grandmother    History  Sexual Activity  . Sexual activity: Not on file    Outpatient Encounter Prescriptions as of 08/22/2016  Medication Sig  . acetaminophen (TYLENOL ARTHRITIS PAIN) 650 MG CR tablet Take 1,300 mg by mouth 2 (two) times daily.   Marland Kitchen b complex vitamins tablet Take 1 tablet by mouth daily.    . dorzolamide-timolol (COSOPT) 22.3-6.8 MG/ML ophthalmic solution Place 1 drop into the left eye 2 (two) times daily.  Marland Kitchen gabapentin (NEURONTIN) 100 MG capsule Take 2 capsules (200 mg total) by  mouth at bedtime. (Patient taking differently: 200 mg. Take two 300 mg tablets by mouth daily.)  . glucosamine-chondroitin 500-400 MG tablet Take 1 tablet by mouth 2 (two) times daily.    . iron polysaccharides (NIFEREX) 150 MG capsule Take 1 capsule (150 mg total) by mouth daily.  Marland Kitchen latanoprost (XALATAN) 0.005 % ophthalmic solution Place 1 drop into the right eye daily.   Marland Kitchen omeprazole (PRILOSEC) 20 MG capsule TAKE 1 CAPSULE EVERY DAY  . sertraline (ZOLOFT) 100 MG tablet Take 1 tablet (100 mg total) by mouth daily.  . [DISCONTINUED] Omeprazole 20 MG TBEC Take 1 tablet (20 mg total) by mouth daily. (Patient not taking: Reported on 08/22/2016)   No facility-administered encounter medications on file as of 08/22/2016.     Activities of Daily Living In your present state of health, do you have any difficulty performing the  following activities: 08/22/2016  Hearing? N  Vision? N  Difficulty concentrating or making decisions? N  Walking or climbing stairs? N  Dressing or bathing? N  Doing errands, shopping? N  Preparing Food and eating ? N  Using the Toilet? N  In the past six months, have you accidently leaked urine? N  Do you have problems with loss of bowel control? N  Managing your Medications? N  Managing your Finances? N  Housekeeping or managing your Housekeeping? N  Some recent data might be hidden    Patient Care Team: Hoyt Koch, MD as PCP - General (Internal Medicine) Melida Quitter, MD as Consulting Physician Princess Bruins, MD (Obstetrics and Gynecology) Suella Broad, MD (Physical Medicine and Rehabilitation) Rondel Oh, MD (Ophthalmology) Katy Apo, MD (Ophthalmology) Kristeen Miss, MD as Consulting Physician (Neurosurgery) Jerene Bears, MD as Consulting Physician (Gastroenterology) Katy Apo, MD as Consulting Physician (Ophthalmology) Ignatius Specking (Dentistry)    Assessment:    Physical assessment deferred to PCP.  Exercise Activities and Dietary recommendations Current Exercise Habits: Structured exercise class, Type of exercise: Other - see comments (water aerobics), Time (Minutes): 60, Frequency (Times/Week): 3, Weekly Exercise (Minutes/Week): 180, Exercise limited by: orthopedic condition(s)   Diet (meal preparation, eat out, water intake, caffeinated beverages, dairy products, fruits and vegetables): Eats at home. Drinks coffee, green tea and water.   Breakfast: oatmeal, cereal, fruit, yogurt, toast Lunch: chicken salad, egg salad, soup Dinner: meat and 2 vegetables.  Snacks on chocolate and popcorn.   Discussed heart healthy diet and increasing exercise.   Goals    . Weight (lb) < 140 lb (63.5 kg)          Would like to lose 11 pounds by staying active and making healthy food choices.       Fall Risk Fall Risk  08/22/2016 04/11/2016 02/28/2015  08/10/2013 07/03/2012  Falls in the past year? Yes Yes No Exclusion - non ambulatory No  Number falls in past yr: 1 2 or more - - -  Injury with Fall? No No - - -  Follow up Falls prevention discussed - - - -   Depression Screen PHQ 2/9 Scores 08/22/2016 02/28/2015 08/10/2013 07/03/2012  PHQ - 2 Score 0 1 1 0     Cognitive Function       Ad8 score reviewed for issues:  Issues making decisions:no  Less interest in hobbies / activities:no  Repeats questions, stories (family complaining):no  Trouble using ordinary gadgets (microwave, computer, phone):no  Forgets the month or year: no  Mismanaging finances: no  Remembering appts:no  Daily problems with thinking and/or memory:no Ad8  score is=0     Immunization History  Administered Date(s) Administered  . Influenza Split 05/12/2012  . Influenza Whole 05/12/2009, 05/29/2010  . Influenza, High Dose Seasonal PF 06/12/2013, 06/05/2016  . Influenza-Unspecified 05/12/2014, 05/27/2015  . Pneumococcal Conjugate-13 08/10/2013  . Pneumococcal Polysaccharide-23 07/12/2011  . Td 01/13/2004  . Tdap 02/23/2014  . Zoster 01/27/2008, 05/12/2009   Screening Tests Health Maintenance  Topic Date Due  . COLONOSCOPY  06/27/2020  . TETANUS/TDAP  02/24/2024  . INFLUENZA VACCINE  Addressed  . DEXA SCAN  Completed  . ZOSTAVAX  Completed  . PNA vac Low Risk Adult  Completed      Plan:     Continue to eat heart healthy diet (full of fruits, vegetables, whole grains, lean protein, water--limit salt, fat, and sugar intake) and increase physical activity as tolerated.  Continue doing brain stimulating activities (puzzles, reading, adult coloring books, staying active) to keep memory [place].   Bring a copy of your advance directives to your next office visit.   During the course of the visit the patient was educated and counseled about the following appropriate screening and preventive services:   Vaccines to include Pneumoccal,  Influenza, Hepatitis B, Td, Zostavax, HCV  Cardiovascular Disease  Colorectal cancer screening  Bone density screening  Diabetes screening  Glaucoma screening  Mammography/PAP  Nutrition counseling   Patient Instructions (the written plan) was given to the patient.   Gerilyn Nestle, RN  08/22/2016

## 2016-08-22 NOTE — Patient Instructions (Addendum)
Continue to eat heart healthy diet (full of fruits, vegetables, whole grains, lean protein, water--limit salt, fat, and sugar intake) and increase physical activity as tolerated.  Continue doing brain stimulating activities (puzzles, reading, adult coloring books, staying active) to keep memory [place].   Bring a copy of your advance directives to your next office visit.    Fall Prevention in the Home Introduction Falls can cause injuries. They can happen to people of all ages. There are many things you can do to make your home safe and to help prevent falls. What can I do on the outside of my home?  Regularly fix the edges of walkways and driveways and fix any cracks.  Remove anything that might make you trip as you walk through a door, such as a raised step or threshold.  Trim any bushes or trees on the path to your home.  Use bright outdoor lighting.  Clear any walking paths of anything that might make someone trip, such as rocks or tools.  Regularly check to see if handrails are loose or broken. Make sure that both sides of any steps have handrails.  Any raised decks and porches should have guardrails on the edges.  Have any leaves, snow, or ice cleared regularly.  Use sand or salt on walking paths during winter.  Clean up any spills in your garage right away. This includes oil or grease spills. What can I do in the bathroom?  Use night lights.  Install grab bars by the toilet and in the tub and shower. Do not use towel bars as grab bars.  Use non-skid mats or decals in the tub or shower.  If you need to sit down in the shower, use a plastic, non-slip stool.  Keep the floor dry. Clean up any water that spills on the floor as soon as it happens.  Remove soap buildup in the tub or shower regularly.  Attach bath mats securely with double-sided non-slip rug tape.  Do not have throw rugs and other things on the floor that can make you trip. What can I do in the  bedroom?  Use night lights.  Make sure that you have a light by your bed that is easy to reach.  Do not use any sheets or blankets that are too big for your bed. They should not hang down onto the floor.  Have a firm chair that has side arms. You can use this for support while you get dressed.  Do not have throw rugs and other things on the floor that can make you trip. What can I do in the kitchen?  Clean up any spills right away.  Avoid walking on wet floors.  Keep items that you use a lot in easy-to-reach places.  If you need to reach something above you, use a strong step stool that has a grab bar.  Keep electrical cords out of the way.  Do not use floor polish or wax that makes floors slippery. If you must use wax, use non-skid floor wax.  Do not have throw rugs and other things on the floor that can make you trip. What can I do with my stairs?  Do not leave any items on the stairs.  Make sure that there are handrails on both sides of the stairs and use them. Fix handrails that are broken or loose. Make sure that handrails are as long as the stairways.  Check any carpeting to make sure that it is firmly attached to   the stairs. Fix any carpet that is loose or worn.  Avoid having throw rugs at the top or bottom of the stairs. If you do have throw rugs, attach them to the floor with carpet tape.  Make sure that you have a light switch at the top of the stairs and the bottom of the stairs. If you do not have them, ask someone to add them for you. What else can I do to help prevent falls?  Wear shoes that:  Do not have high heels.  Have rubber bottoms.  Are comfortable and fit you well.  Are closed at the toe. Do not wear sandals.  If you use a stepladder:  Make sure that it is fully opened. Do not climb a closed stepladder.  Make sure that both sides of the stepladder are locked into place.  Ask someone to hold it for you, if possible.  Clearly mark and make  sure that you can see:  Any grab bars or handrails.  First and last steps.  Where the edge of each step is.  Use tools that help you move around (mobility aids) if they are needed. These include:  Canes.  Walkers.  Scooters.  Crutches.  Turn on the lights when you go into a dark area. Replace any light bulbs as soon as they burn out.  Set up your furniture so you have a clear path. Avoid moving your furniture around.  If any of your floors are uneven, fix them.  If there are any pets around you, be aware of where they are.  Review your medicines with your doctor. Some medicines can make you feel dizzy. This can increase your chance of falling. Ask your doctor what other things that you can do to help prevent falls. This information is not intended to replace advice given to you by your health care provider. Make sure you discuss any questions you have with your health care provider. Document Released: 05/25/2009 Document Revised: 01/04/2016 Document Reviewed: 09/02/2014  2017 Elsevier  Health Maintenance, Female Introduction Adopting a healthy lifestyle and getting preventive care can go a long way to promote health and wellness. Talk with your health care provider about what schedule of regular examinations is right for you. This is a good chance for you to check in with your provider about disease prevention and staying healthy. In between checkups, there are plenty of things you can do on your own. Experts have done a lot of research about which lifestyle changes and preventive measures are most likely to keep you healthy. Ask your health care provider for more information. Weight and diet Eat a healthy diet  Be sure to include plenty of vegetables, fruits, low-fat dairy products, and lean protein.  Do not eat a lot of foods high in solid fats, added sugars, or salt.  Get regular exercise. This is one of the most important things you can do for your health.  Most  adults should exercise for at least 150 minutes each week. The exercise should increase your heart rate and make you sweat (moderate-intensity exercise).  Most adults should also do strengthening exercises at least twice a week. This is in addition to the moderate-intensity exercise. Maintain a healthy weight  Body mass index (BMI) is a measurement that can be used to identify possible weight problems. It estimates body fat based on height and weight. Your health care provider can help determine your BMI and help you achieve or maintain a healthy weight.  For   females 20 years of age and older:  A BMI below 18.5 is considered underweight.  A BMI of 18.5 to 24.9 is normal.  A BMI of 25 to 29.9 is considered overweight.  A BMI of 30 and above is considered obese. Watch levels of cholesterol and blood lipids  You should start having your blood tested for lipids and cholesterol at 76 years of age, then have this test every 5 years.  You may need to have your cholesterol levels checked more often if:  Your lipid or cholesterol levels are high.  You are older than 76 years of age.  You are at high risk for heart disease. Cancer screening Lung Cancer  Lung cancer screening is recommended for adults 55-80 years old who are at high risk for lung cancer because of a history of smoking.  A yearly low-dose CT scan of the lungs is recommended for people who:  Currently smoke.  Have quit within the past 15 years.  Have at least a 30-pack-year history of smoking. A pack year is smoking an average of one pack of cigarettes a day for 1 year.  Yearly screening should continue until it has been 15 years since you quit.  Yearly screening should stop if you develop a health problem that would prevent you from having lung cancer treatment. Breast Cancer  Practice breast self-awareness. This means understanding how your breasts normally appear and feel.  It also means doing regular breast  self-exams. Let your health care provider know about any changes, no matter how small.  If you are in your 20s or 30s, you should have a clinical breast exam (CBE) by a health care provider every 1-3 years as part of a regular health exam.  If you are 40 or older, have a CBE every year. Also consider having a breast X-ray (mammogram) every year.  If you have a family history of breast cancer, talk to your health care provider about genetic screening.  If you are at high risk for breast cancer, talk to your health care provider about having an MRI and a mammogram every year.  Breast cancer gene (BRCA) assessment is recommended for women who have family members with BRCA-related cancers. BRCA-related cancers include:  Breast.  Ovarian.  Tubal.  Peritoneal cancers.  Results of the assessment will determine the need for genetic counseling and BRCA1 and BRCA2 testing. Cervical Cancer  Your health care provider may recommend that you be screened regularly for cancer of the pelvic organs (ovaries, uterus, and vagina). This screening involves a pelvic examination, including checking for microscopic changes to the surface of your cervix (Pap test). You may be encouraged to have this screening done every 3 years, beginning at age 21.  For women ages 30-65, health care providers may recommend pelvic exams and Pap testing every 3 years, or they may recommend the Pap and pelvic exam, combined with testing for human papilloma virus (HPV), every 5 years. Some types of HPV increase your risk of cervical cancer. Testing for HPV may also be done on women of any age with unclear Pap test results.  Other health care providers may not recommend any screening for nonpregnant women who are considered low risk for pelvic cancer and who do not have symptoms. Ask your health care provider if a screening pelvic exam is right for you.  If you have had past treatment for cervical cancer or a condition that could lead  to cancer, you need Pap tests and screening for   cancer for at least 20 years after your treatment. If Pap tests have been discontinued, your risk factors (such as having a new sexual partner) need to be reassessed to determine if screening should resume. Some women have medical problems that increase the chance of getting cervical cancer. In these cases, your health care provider may recommend more frequent screening and Pap tests. Colorectal Cancer  This type of cancer can be detected and often prevented.  Routine colorectal cancer screening usually begins at 76 years of age and continues through 75 years of age.  Your health care provider may recommend screening at an earlier age if you have risk factors for colon cancer.  Your health care provider may also recommend using home test kits to check for hidden blood in the stool.  A small camera at the end of a tube can be used to examine your colon directly (sigmoidoscopy or colonoscopy). This is done to check for the earliest forms of colorectal cancer.  Routine screening usually begins at age 50.  Direct examination of the colon should be repeated every 5-10 years through 75 years of age. However, you may need to be screened more often if early forms of precancerous polyps or small growths are found. Skin Cancer  Check your skin from head to toe regularly.  Tell your health care provider about any new moles or changes in moles, especially if there is a change in a mole's shape or color.  Also tell your health care provider if you have a mole that is larger than the size of a pencil eraser.  Always use sunscreen. Apply sunscreen liberally and repeatedly throughout the day.  Protect yourself by wearing long sleeves, pants, a wide-brimmed hat, and sunglasses whenever you are outside. Heart disease, diabetes, and high blood pressure  High blood pressure causes heart disease and increases the risk of stroke. High blood pressure is more  likely to develop in:  People who have blood pressure in the high end of the normal range (130-139/85-89 mm Hg).  People who are overweight or obese.  People who are African American.  If you are 18-39 years of age, have your blood pressure checked every 3-5 years. If you are 40 years of age or older, have your blood pressure checked every year. You should have your blood pressure measured twice-once when you are at a hospital or clinic, and once when you are not at a hospital or clinic. Record the average of the two measurements. To check your blood pressure when you are not at a hospital or clinic, you can use:  An automated blood pressure machine at a pharmacy.  A home blood pressure monitor.  If you are between 55 years and 79 years old, ask your health care provider if you should take aspirin to prevent strokes.  Have regular diabetes screenings. This involves taking a blood sample to check your fasting blood sugar level.  If you are at a normal weight and have a low risk for diabetes, have this test once every three years after 76 years of age.  If you are overweight and have a high risk for diabetes, consider being tested at a younger age or more often. Preventing infection Hepatitis B  If you have a higher risk for hepatitis B, you should be screened for this virus. You are considered at high risk for hepatitis B if:  You were born in a country where hepatitis B is common. Ask your health care provider which countries   are considered high risk.  Your parents were born in a high-risk country, and you have not been immunized against hepatitis B (hepatitis B vaccine).  You have HIV or AIDS.  You use needles to inject street drugs.  You live with someone who has hepatitis B.  You have had sex with someone who has hepatitis B.  You get hemodialysis treatment.  You take certain medicines for conditions, including cancer, organ transplantation, and autoimmune  conditions. Hepatitis C  Blood testing is recommended for:  Everyone born from 1945 through 1965.  Anyone with known risk factors for hepatitis C. Sexually transmitted infections (STIs)  You should be screened for sexually transmitted infections (STIs) including gonorrhea and chlamydia if:  You are sexually active and are younger than 76 years of age.  You are older than 76 years of age and your health care provider tells you that you are at risk for this type of infection.  Your sexual activity has changed since you were last screened and you are at an increased risk for chlamydia or gonorrhea. Ask your health care provider if you are at risk.  If you do not have HIV, but are at risk, it may be recommended that you take a prescription medicine daily to prevent HIV infection. This is called pre-exposure prophylaxis (PrEP). You are considered at risk if:  You are sexually active and do not regularly use condoms or know the HIV status of your partner(s).  You take drugs by injection.  You are sexually active with a partner who has HIV. Talk with your health care provider about whether you are at high risk of being infected with HIV. If you choose to begin PrEP, you should first be tested for HIV. You should then be tested every 3 months for as long as you are taking PrEP. Pregnancy  If you are premenopausal and you may become pregnant, ask your health care provider about preconception counseling.  If you may become pregnant, take 400 to 800 micrograms (mcg) of folic acid every day.  If you want to prevent pregnancy, talk to your health care provider about birth control (contraception). Osteoporosis and menopause  Osteoporosis is a disease in which the bones lose minerals and strength with aging. This can result in serious bone fractures. Your risk for osteoporosis can be identified using a bone density scan.  If you are 65 years of age or older, or if you are at risk for  osteoporosis and fractures, ask your health care provider if you should be screened.  Ask your health care provider whether you should take a calcium or vitamin D supplement to lower your risk for osteoporosis.  Menopause may have certain physical symptoms and risks.  Hormone replacement therapy may reduce some of these symptoms and risks. Talk to your health care provider about whether hormone replacement therapy is right for you. Follow these instructions at home:  Schedule regular health, dental, and eye exams.  Stay current with your immunizations.  Do not use any tobacco products including cigarettes, chewing tobacco, or electronic cigarettes.  If you are pregnant, do not drink alcohol.  If you are breastfeeding, limit how much and how often you drink alcohol.  Limit alcohol intake to no more than 1 drink per day for nonpregnant women. One drink equals 12 ounces of beer, 5 ounces of wine, or 1 ounces of hard liquor.  Do not use street drugs.  Do not share needles.  Ask your health care provider for help   you need support or information about quitting drugs.  Tell your health care provider if you often feel depressed.  Tell your health care provider if you have ever been abused or do not feel safe at home. This information is not intended to replace advice given to you by your health care provider. Make sure you discuss any questions you have with your health care provider. Document Released: 02/11/2011 Document Revised: 01/04/2016 Document Reviewed: 05/02/2015  2017 Elsevier

## 2016-08-23 ENCOUNTER — Telehealth: Payer: Self-pay | Admitting: *Deleted

## 2016-08-23 NOTE — Telephone Encounter (Signed)
Caller: self CB #[phone]  Reason for call: She saw PCP and Health Coach yesterday. She could not remember when her last DEXA was. Today she remembered she had it at the same time as [facility] last July.  [place], they are faxing results to [phone], ATTN: Maudie Mercury

## 2016-08-23 NOTE — Assessment & Plan Note (Signed)
Taking zoloft and doing well, she was also having some depression at onset but no longer. Continue zoloft. We talked about coming off and she does not want to do that now.

## 2016-08-23 NOTE — Assessment & Plan Note (Signed)
Controlled on omeprazole 20 mg daily. Refilled.

## 2016-08-23 NOTE — Progress Notes (Signed)
Patient ID: Linda [name], female   DOB: [birth year], 76 y.o.   MRN: [id number] Medical screening examination/treatment/procedure(s) were performed by non-physician practitioner and as supervising physician I was immediately available for consultation/collaboration. I agree with findings. Hoyt Koch, MD

## 2016-08-26 NOTE — Telephone Encounter (Signed)
Kim, these results did not come in while I was there Friday. Will you please keep an eye out for the report?

## 2016-08-30 ENCOUNTER — Encounter: Payer: Self-pay | Admitting: Internal Medicine

## 2016-08-30 NOTE — Progress Notes (Unsigned)
Results entered and sent to scan  

## 2016-09-02 ENCOUNTER — Ambulatory Visit
Admission: RE | Admit: 2016-09-02 | Discharge: 2016-09-02 | Disposition: A | Discharge status: Home / Self Care | Payer: Medicare Other | Source: Ambulatory Visit | Attending: Neurological Surgery | Admitting: Neurological Surgery

## 2016-09-02 DIAGNOSIS — M48061 Spinal stenosis, lumbar region without neurogenic claudication: Secondary | ICD-10-CM | POA: Diagnosis not present

## 2016-09-02 DIAGNOSIS — M4150 Other secondary scoliosis, site unspecified: Principal | ICD-10-CM

## 2016-09-03 ENCOUNTER — Encounter: Payer: Self-pay | Admitting: Internal Medicine

## 2016-09-03 DIAGNOSIS — N6321 Unspecified lump in the left breast, upper outer quadrant: Secondary | ICD-10-CM | POA: Diagnosis not present

## 2016-09-03 LAB — HM MAMMOGRAPHY

## 2016-09-04 ENCOUNTER — Encounter: Payer: Self-pay | Admitting: Internal Medicine

## 2016-09-04 NOTE — Progress Notes (Unsigned)
Results entered and sent to scan  

## 2016-09-06 ENCOUNTER — Encounter: Payer: Self-pay | Admitting: Internal Medicine

## 2016-09-11 DIAGNOSIS — M4150 Other secondary scoliosis, site unspecified: Secondary | ICD-10-CM | POA: Diagnosis not present

## 2016-10-14 DIAGNOSIS — H401132 Primary open-angle glaucoma, bilateral, moderate stage: Secondary | ICD-10-CM | POA: Diagnosis not present

## 2016-10-14 DIAGNOSIS — H2511 Age-related nuclear cataract, right eye: Secondary | ICD-10-CM | POA: Diagnosis not present

## 2016-10-14 DIAGNOSIS — H25041 Posterior subcapsular polar age-related cataract, right eye: Secondary | ICD-10-CM | POA: Diagnosis not present

## 2016-10-14 DIAGNOSIS — H5211 Myopia, right eye: Secondary | ICD-10-CM | POA: Diagnosis not present

## 2016-12-03 DIAGNOSIS — H25811 Combined forms of age-related cataract, right eye: Secondary | ICD-10-CM | POA: Diagnosis not present

## 2016-12-03 DIAGNOSIS — H2511 Age-related nuclear cataract, right eye: Secondary | ICD-10-CM | POA: Diagnosis not present

## 2016-12-03 DIAGNOSIS — H25041 Posterior subcapsular polar age-related cataract, right eye: Secondary | ICD-10-CM | POA: Diagnosis not present

## 2017-01-23 ENCOUNTER — Ambulatory Visit (INDEPENDENT_AMBULATORY_CARE_PROVIDER_SITE_OTHER): Payer: Medicare Other | Admitting: Internal Medicine

## 2017-01-23 ENCOUNTER — Encounter: Payer: Self-pay | Admitting: Internal Medicine

## 2017-01-23 DIAGNOSIS — J069 Acute upper respiratory infection, unspecified: Secondary | ICD-10-CM | POA: Insufficient documentation

## 2017-01-23 MED ORDER — AZITHROMYCIN 250 MG PO TABS: ORAL_TABLET | ORAL | 1 refills | Status: DC

## 2017-01-23 MED ORDER — HYDROCODONE-HOMATROPINE 5-1.5 MG/5ML PO SYRP: 5.0000 mL | ORAL_SOLUTION | Freq: Four times a day (QID) | ORAL | 0 refills | Status: AC | PRN

## 2017-01-23 NOTE — Patient Instructions (Addendum)
Please take all new medication as prescribed - the antibiotic, and cough medicine if needed  You can also take Mucinex (or it's generic off brand) for congestion, and tylenol as needed for pain.  Please continue all other medications as before, and refills have been done if requested.  Please have the pharmacy call with any other refills you may need.  Please keep your appointments with your specialists as you may have planned   

## 2017-01-23 NOTE — Progress Notes (Signed)
Subjective:    Patient ID: Linda [name], female    DOB: 15-Apr-1941, 76 y.o.   MRN: [id number]  HPI   Here with 2-3 days acute onset fever, facial pain, pressure, headache, general weakness and malaise, and greenish d/c, with mild ST and cough, but pt denies chest pain, wheezing, increased sob or doe, orthopnea, PND, increased LE swelling, palpitations, dizziness or syncope. Past Medical History:  Diagnosis Date  . ALLERGIC RHINITIS   . Allergy   . ANEMIA-IRON DEFICIENCY   . Anxiety state, unspecified   . Carpal tunnel syndrome    pt denies   . COLONIC POLYPS, HX OF 2007  . GERD   . GLAUCOMA   . GOITER, MULTINODULAR    on [place] 2006, unchanged 6/13 with nodules all <48mm  . HIATAL HERNIA WITH REFLUX   . Hip dysplasia, acquired    B sx; eval at [facility] for same 01/2015 -ongoing PT  . OSTEOARTHRITIS   . TOBACCO USE, QUIT    Past Surgical History:  Procedure Laterality Date  . BREAST SURGERY  1960   Left breast cyst removed no complications  . COLONOSCOPY  2005  . EYE SURGERY  1991-1992   both eyes  . EYE SURGERY Left 2014   shunt behind left eye  . Right shoulder aurgery  10/2008    reports that she quit smoking about 28 years ago. She has never used smokeless tobacco. She reports that she drinks alcohol. She reports that she does not use drugs. family history includes Arthritis in her father and mother; Colon cancer in her paternal grandmother; Diabetes in her mother; Heart disease in her mother; Hypertension in her mother. No Known Allergies Current Outpatient Prescriptions on File Prior to Visit  Medication Sig Dispense Refill  . acetaminophen (TYLENOL ARTHRITIS PAIN) 650 MG CR tablet Take 1,300 mg by mouth 2 (two) times daily.     Marland Kitchen b complex vitamins tablet Take 1 tablet by mouth daily.      . dorzolamide-timolol (COSOPT) 22.3-6.8 MG/ML ophthalmic solution Place 1 drop into the left eye 2 (two) times daily.    Marland Kitchen gabapentin (NEURONTIN) 100 MG capsule Take 2 capsules (200 mg  total) by mouth at bedtime. (Patient taking differently: 200 mg. Take two 300 mg tablets by mouth daily.) 180 capsule 3  . glucosamine-chondroitin 500-400 MG tablet Take 1 tablet by mouth 2 (two) times daily.      . iron polysaccharides (NIFEREX) 150 MG capsule Take 1 capsule (150 mg total) by mouth daily.    Marland Kitchen latanoprost (XALATAN) 0.005 % ophthalmic solution Place 1 drop into the right eye daily.     Marland Kitchen omeprazole (PRILOSEC) 20 MG capsule TAKE 1 CAPSULE EVERY DAY 90 capsule 3  . sertraline (ZOLOFT) 100 MG tablet Take 1 tablet (100 mg total) by mouth daily. 90 tablet 1   No current facility-administered medications on file prior to visit.    Review of Systems All otherwise neg per pt    Objective:   Physical Exam BP 132/80   Ht 5\' 4"  (1.626 m)   Wt 145 lb (65.8 kg)   BMI 24.89 kg/m  VS noted, mild ill Constitutional: Pt appears in NAD HENT: Head: NCAT.  Right Ear: External ear normal.  Left Ear: External ear normal.  Eyes: . Pupils are equal, round, and reactive to light. Conjunctivae and EOM are normal Bilat tm's with mild erythema.  Max sinus areas mild tender.  Pharynx with mild erythema, no exudate Nose: without  d/c or deformity Neck: Neck supple. Gross normal ROM Cardiovascular: Normal rate and regular rhythm.   Pulmonary/Chest: Effort normal and breath sounds without rales or wheezing.  Neurological: Pt is alert. At baseline orientation, motor grossly intact Skin: Skin is warm. No rashes, other new lesions, no LE edema Psychiatric: Pt behavior is normal without agitation  No other exam findings    Assessment & Plan:

## 2017-01-26 NOTE — Assessment & Plan Note (Signed)
Mild to mod, for antibx course,  to f/u any worsening symptoms or concerns 

## 2017-03-06 ENCOUNTER — Encounter: Payer: Self-pay | Admitting: Internal Medicine

## 2017-03-06 DIAGNOSIS — Z09 Encounter for follow-up examination after completed treatment for conditions other than malignant neoplasm: Secondary | ICD-10-CM | POA: Diagnosis not present

## 2017-03-07 ENCOUNTER — Encounter: Payer: Self-pay | Admitting: Internal Medicine

## 2017-03-07 NOTE — Progress Notes (Signed)
Abstracted and sent to scan  

## 2017-03-21 ENCOUNTER — Telehealth: Payer: Self-pay | Admitting: Internal Medicine

## 2017-03-21 MED ORDER — SERTRALINE HCL 100 MG PO TABS: 100.0000 mg | ORAL_TABLET | Freq: Every day | ORAL | 1 refills | Status: DC

## 2017-03-21 NOTE — Telephone Encounter (Signed)
Pt is needing updated script old script has expired inform [employer] will send electronically...Linda [name]

## 2017-03-21 NOTE — Telephone Encounter (Signed)
sertraline (ZOLOFT) 100 MG tablet   Patient is requesting a refill on this medication. She states [employer] is sent her something saying they needed more information before they could fill it. Please follow up with patient.  She can be reached at : [phone]

## 2017-03-24 ENCOUNTER — Telehealth: Payer: Self-pay | Admitting: Internal Medicine

## 2017-03-24 NOTE — Telephone Encounter (Signed)
Okay 

## 2017-03-24 NOTE — Telephone Encounter (Signed)
Patient calling to request to switch PCP from Dr. Pricilla Holm at [facility], to Dr. Briscoe Deutscher  At [facility].  Please respond at your earliest convenience to acknowledge the patient's request.  Thank you,  -LL

## 2017-04-01 NOTE — Telephone Encounter (Signed)
It is ok for patient to transfer care

## 2017-04-03 NOTE — Telephone Encounter (Signed)
Patient no longer interested in switching PCP.  TY,  -LL

## 2017-05-05 ENCOUNTER — Other Ambulatory Visit: Payer: Self-pay | Admitting: Internal Medicine

## 2017-05-14 DIAGNOSIS — H903 Sensorineural hearing loss, bilateral: Secondary | ICD-10-CM | POA: Diagnosis not present

## 2017-05-23 DIAGNOSIS — L738 Other specified follicular disorders: Secondary | ICD-10-CM | POA: Diagnosis not present

## 2017-05-23 DIAGNOSIS — L820 Inflamed seborrheic keratosis: Secondary | ICD-10-CM | POA: Diagnosis not present

## 2017-05-23 DIAGNOSIS — L821 Other seborrheic keratosis: Secondary | ICD-10-CM | POA: Diagnosis not present

## 2017-05-25 DIAGNOSIS — Z23 Encounter for immunization: Secondary | ICD-10-CM | POA: Diagnosis not present

## 2017-07-18 ENCOUNTER — Other Ambulatory Visit: Payer: Self-pay | Admitting: Internal Medicine

## 2017-08-08 ENCOUNTER — Ambulatory Visit: Payer: Medicare Other | Admitting: Nurse Practitioner

## 2017-08-09 ENCOUNTER — Ambulatory Visit (INDEPENDENT_AMBULATORY_CARE_PROVIDER_SITE_OTHER): Payer: Medicare Other | Admitting: Family Medicine

## 2017-08-09 ENCOUNTER — Encounter: Payer: Self-pay | Admitting: Family Medicine

## 2017-08-09 VITALS — BP 134/78 | HR 81 | Temp 98.2°F | Wt 146.0 lb

## 2017-08-09 DIAGNOSIS — J209 Acute bronchitis, unspecified: Secondary | ICD-10-CM

## 2017-08-09 MED ORDER — AZITHROMYCIN 250 MG PO TABS: ORAL_TABLET | ORAL | 0 refills | Status: DC

## 2017-08-09 MED ORDER — BENZONATATE 200 MG PO CAPS: 200.0000 mg | ORAL_CAPSULE | Freq: Three times a day (TID) | ORAL | 1 refills | Status: DC | PRN

## 2017-08-09 NOTE — Progress Notes (Signed)
Subjective:    Patient ID: Linda [name], female    DOB: 1940/11/08, 76 y.o.   MRN: [id number]  HPI Here for uri symptoms    (taking care of husb with metastatic cancer)   Symptoms started xmas eve  ST at night and then runny/stuffy nose   Worse the past 2 days  Nasal d/c - a little color to it  Cough- brown sputum     (prior smoker-no copd) , sometimes cough is dry   Ears -no pain  Throat is raw /not really sore   No fever   Halls cough drop  Tylenol for arthritis every day   Patient Active Problem List   Diagnosis Date Noted  . Acute bronchitis 08/09/2017  . Routine general medical examination at a health care facility 07/22/2015  . Hip dysplasia, acquired   . Bladder prolapse, female, acquired 01/09/2012  . Anxiety state 05/29/2010  . GLAUCOMA 12/14/2009  . Iron deficiency anemia 05/21/2007  . Osteoarthritis 05/21/2007   Past Medical History:  Diagnosis Date  . ALLERGIC RHINITIS   . Allergy   . ANEMIA-IRON DEFICIENCY   . Anxiety state, unspecified   . Carpal tunnel syndrome    pt denies   . COLONIC POLYPS, HX OF 2007  . GERD   . GLAUCOMA   . GOITER, MULTINODULAR    on [place] 2006, unchanged 6/13 with nodules all <57mm  . HIATAL HERNIA WITH REFLUX   . Hip dysplasia, acquired    B sx; eval at [facility] for same 01/2015 -ongoing PT  . OSTEOARTHRITIS   . TOBACCO USE, QUIT    Past Surgical History:  Procedure Laterality Date  . BREAST SURGERY  1960   Left breast cyst removed no complications  . COLONOSCOPY  2005  . EYE SURGERY  1991-1992   both eyes  . EYE SURGERY Left 2014   shunt behind left eye  . Right shoulder aurgery  10/2008   Social History   Tobacco Use  . Smoking status: Former Smoker    Last attempt to quit: 08/12/1988    Years since quitting: 29.0  . Smokeless tobacco: Never Used  Substance Use Topics  . Alcohol use: Yes    Alcohol/week: 0.0 oz    Comment: occassionally  . Drug use: No   Family History  Problem Relation Age of Onset  .  Diabetes Mother   . Heart disease Mother   . Arthritis Mother   . Hypertension Mother   . Arthritis Father   . Colon cancer Paternal Grandmother    No Known Allergies Current Outpatient Medications on File Prior to Visit  Medication Sig Dispense Refill  . acetaminophen (TYLENOL ARTHRITIS PAIN) 650 MG CR tablet Take 1,300 mg by mouth 2 (two) times daily.     Marland Kitchen b complex vitamins tablet Take 1 tablet by mouth daily.      . dorzolamide-timolol (COSOPT) 22.3-6.8 MG/ML ophthalmic solution Place 1 drop into the left eye 2 (two) times daily.    Marland Kitchen gabapentin (NEURONTIN) 100 MG capsule Take 2 capsules (200 mg total) by mouth at bedtime. (Patient taking differently: 200 mg. Take two 300 mg tablets by mouth daily.) 180 capsule 3  . glucosamine-chondroitin 500-400 MG tablet Take 1 tablet by mouth 2 (two) times daily.      . iron polysaccharides (NIFEREX) 150 MG capsule Take 1 capsule (150 mg total) by mouth daily.    Marland Kitchen latanoprost (XALATAN) 0.005 % ophthalmic solution Place 1 drop into the right eye  daily.     . omeprazole (PRILOSEC) 20 MG capsule TAKE 1 CAPSULE (20 MG TOTAL) BY MOUTH DAILY. FOLLOW-UP APPT IS DUE. MUST SEE PROVIDER FOR FUTURE REFILLS 90 capsule 0  . sertraline (ZOLOFT) 100 MG tablet Take 1 tablet (100 mg total) by mouth daily. 90 tablet 1   No current facility-administered medications on file prior to visit.     Review of Systems  Constitutional: Positive for appetite change and fatigue. Negative for fever.  HENT: Positive for congestion, postnasal drip, rhinorrhea, sinus pressure, sneezing and sore throat. Negative for ear pain.   Eyes: Negative for pain and discharge.  Respiratory: Positive for cough. Negative for shortness of breath, wheezing and stridor.   Cardiovascular: Negative for chest pain.  Gastrointestinal: Negative for diarrhea, nausea and vomiting.  Genitourinary: Negative for frequency, hematuria and urgency.  Musculoskeletal: Negative for arthralgias and myalgias.    Skin: Negative for rash.  Neurological: Positive for headaches. Negative for dizziness, weakness and light-headedness.  Psychiatric/Behavioral: Negative for confusion and dysphoric mood.       Objective:   Physical Exam  Constitutional: She appears well-developed and well-nourished. No distress.  Well but fatigued appearing   HENT:  Head: Normocephalic and atraumatic.  Right Ear: External ear normal.  Left Ear: External ear normal.  Mouth/Throat: Oropharynx is clear and moist.  Nares are injected and congested  No sinus tenderness Clear rhinorrhea and post nasal drip   Eyes: Conjunctivae and EOM are normal. Pupils are equal, round, and reactive to light. Right eye exhibits no discharge. Left eye exhibits no discharge.  Neck: Normal range of motion. Neck supple.  Cardiovascular: Normal rate and normal heart sounds.  Pulmonary/Chest: Effort normal and breath sounds normal. No respiratory distress. She has no wheezes. She has no rales. She exhibits no tenderness.  Harsh bs  Occ scattered rhonchi No rales or wheeze   Lymphadenopathy:    She has no cervical adenopathy.  Neurological: She is alert.  Skin: Skin is warm and dry. No rash noted.  Psychiatric: She has a normal mood and affect.          Assessment & Plan:   Problem List Items Addressed This Visit      Respiratory   Acute bronchitis    Azithromycin due to length of illness  Fluids/rest  Tessalon for cough Also guif/DM otc  Disc symptomatic care - see instructions on AVS  Update if not starting to improve in a week or if worsening  -esp if any wheezing

## 2017-08-09 NOTE — Patient Instructions (Addendum)
Drink lots of fluids Rest when you can  Take the zithromax for bronchitis  If you start wheezing let us know  Try tessalon for cough  otc - mucinex DM or robitussin DM for congestion and cough   Update if not starting to improve in a week or if worsening

## 2017-08-10 NOTE — Assessment & Plan Note (Signed)
Azithromycin due to length of illness  Fluids/rest  Tessalon for cough Also guif/DM otc  Disc symptomatic care - see instructions on AVS  Update if not starting to improve in a week or if worsening  -esp if any wheezing

## 2017-08-21 DIAGNOSIS — Z961 Presence of intraocular lens: Secondary | ICD-10-CM | POA: Diagnosis not present

## 2017-08-27 NOTE — Progress Notes (Deleted)
Subjective:   Linda [name] is a 77 y.o. female who presents for Medicare Annual (Subsequent) preventive examination.  Review of Systems:  No ROS.  Medicare Wellness Visit. Additional risk factors are reflected in the social history.    Sleep patterns: {SX; SLEEP PATTERNS:18802::"feels rested on waking","does not get up to void","gets up *** times nightly to void","sleeps *** hours nightly"}.    Home Safety/Smoke Alarms: Feels safe in home. Smoke alarms in place.  Living environment; residence and Firearm Safety: {Rehab home environment / accessibility:30080::"no firearms","firearms stored safely"}. Seat Belt Safety/Bike Helmet: Wears seat belt.     Objective:     Vitals: There were no vitals taken for this visit.  There is no height or weight on file to calculate BMI.  Advanced Directives 08/22/2016 06/07/2015  Does Patient Have a Medical Advance Directive? Yes No;Yes  Type of Paramedic of [place];Living will [name];Living will  Copy of [name] in Chart? No - copy requested -    Tobacco Social History   Tobacco Use  Smoking Status Former Smoker  . Last attempt to quit: 08/12/1988  . Years since quitting: 29.0  Smokeless Tobacco Never Used     Counseling given: Not Answered   Past Medical History:  Diagnosis Date  . ALLERGIC RHINITIS   . Allergy   . ANEMIA-IRON DEFICIENCY   . Anxiety state, unspecified   . Carpal tunnel syndrome    pt denies   . COLONIC POLYPS, HX OF 2007  . GERD   . GLAUCOMA   . GOITER, MULTINODULAR    on [place] 2006, unchanged 6/13 with nodules all <82mm  . HIATAL HERNIA WITH REFLUX   . Hip dysplasia, acquired    B sx; eval at [facility] for same 01/2015 -ongoing PT  . OSTEOARTHRITIS   . TOBACCO USE, QUIT    Past Surgical History:  Procedure Laterality Date  . BREAST SURGERY  1960   Left breast cyst removed no complications  . COLONOSCOPY  2005  . EYE SURGERY  1991-1992   both eyes  . EYE SURGERY Left 2014   shunt behind left eye  . Right shoulder aurgery  10/2008   Family History  Problem Relation Age of Onset  . Diabetes Mother   . Heart disease Mother   . Arthritis Mother   . Hypertension Mother   . Arthritis Father   . Colon cancer Paternal Grandmother    Social History   Socioeconomic History  . Marital status: Married    Spouse name: Not on file  . Number of children: Not on file  . Years of education: Not on file  . Highest education level: Not on file  Social Needs  . Financial resource strain: Not on file  . Food insecurity - worry: Not on file  . Food insecurity - inability: Not on file  . Transportation needs - medical: Not on file  . Transportation needs - non-medical: Not on file  Occupational History  . Not on file  Tobacco Use  . Smoking status: Former Smoker    Last attempt to quit: 08/12/1988    Years since quitting: 29.0  . Smokeless tobacco: Never Used  Substance and Sexual Activity  . Alcohol use: Yes    Alcohol/week: 0.0 oz    Comment: occassionally  . Drug use: No  . Sexual activity: Not on file  Other Topics Concern  . 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 acetaminophen (TYLENOL ARTHRITIS PAIN) 650 MG CR tablet Take 1,300 mg by mouth 2 (two) times daily.   Marland Kitchen azithromycin (ZITHROMAX Z-PAK) 250 MG tablet Take 2 pills by mouth today and then 1 pill daily for 4 days  . b complex vitamins tablet Take 1 tablet by mouth daily.    . benzonatate (TESSALON) 200 MG capsule Take 1 capsule (200 mg total) by mouth 3 (three) times daily as needed for cough. Swallow whole, do not bite pill  . dorzolamide-timolol (COSOPT) 22.3-6.8 MG/ML ophthalmic solution Place 1 drop into the left eye 2 (two) times daily.  Marland Kitchen gabapentin (NEURONTIN) 100 MG capsule Take 2 capsules (200 mg total) by mouth at bedtime. (Patient taking  differently: 200 mg. Take two 300 mg tablets by mouth daily.)  . glucosamine-chondroitin 500-400 MG tablet Take 1 tablet by mouth 2 (two) times daily.    . iron polysaccharides (NIFEREX) 150 MG capsule Take 1 capsule (150 mg total) by mouth daily.  Marland Kitchen latanoprost (XALATAN) 0.005 % ophthalmic solution Place 1 drop into the right eye daily.   Marland Kitchen omeprazole (PRILOSEC) 20 MG capsule TAKE 1 CAPSULE (20 MG TOTAL) BY MOUTH DAILY. FOLLOW-UP APPT IS DUE. MUST SEE PROVIDER FOR FUTURE REFILLS  . sertraline (ZOLOFT) 100 MG tablet Take 1 tablet (100 mg total) by mouth daily.   No facility-administered encounter medications on file as of 08/28/2017.     Activities of Daily Living No flowsheet data found.  Patient Care Team: Hoyt Koch, MD as PCP - General (Internal Medicine) Melida Quitter, MD as Consulting Physician Princess Bruins, MD (Obstetrics and Gynecology) Suella Broad, MD (Physical Medicine and Rehabilitation) Bond, Tracie Harrier, MD (Ophthalmology) Katy Apo, MD (Ophthalmology) Kristeen Miss, MD as Consulting Physician (Neurosurgery) Pyrtle, Lajuan Lines, MD as Consulting Physician (Gastroenterology) Ignatius Specking (Dentistry)    Assessment:   This is a routine wellness examination for [name]. .ncope   Exercise Activities and Dietary recommendations   Diet (meal preparation, eat out, water intake, caffeinated beverages, dairy products, fruits and vegetables): {Desc; diets:16563}  Goals    . Weight (lb) < 140 lb (63.5 kg)     Would like to lose 11 pounds by staying active and making healthy food choices.        Fall Risk Fall Risk  08/22/2016 04/11/2016 02/28/2015 08/10/2013 07/03/2012  Falls in the past year? Yes Yes No Exclusion - non ambulatory No  Comment - Emmi Telephone Survey: data to providers prior to load - - -  Number falls in past yr: 1 2 or more - - -  Comment - Emmi Telephone Survey Actual Response = 2 - - -  Injury with Fall? No No - - -  Follow up Falls  prevention discussed - - - -   Depression Screen PHQ 2/9 Scores 08/22/2016 02/28/2015 08/10/2013 07/03/2012  PHQ - 2 Score 0 1 1 0     Cognitive Function        Immunization History  Administered Date(s) Administered  . Influenza Split 05/12/2012  . Influenza Whole 05/12/2009, 05/29/2010  . Influenza, High Dose Seasonal PF 06/12/2013, 06/05/2016  . Influenza-Unspecified 05/12/2014, 05/27/2015  . Pneumococcal Conjugate-13 08/10/2013  . Pneumococcal Polysaccharide-23 07/12/2011  . Td 01/13/2004  . Tdap 02/23/2014  . Zoster 01/27/2008, 05/12/2009    Qualifies for Shingles Vaccine?***  Screening Tests Health Maintenance  Topic Date Due  . INFLUENZA VACCINE  03/12/2017  .  COLONOSCOPY  06/27/2020  . TETANUS/TDAP  02/24/2024  . DEXA SCAN  Completed  . PNA vac Low Risk Adult  Completed      Plan:   I have personally reviewed and noted the following in the patient's chart:   . Medical and social history . Use of alcohol, tobacco or illicit drugs  . Current medications and supplements . Functional ability and status . Nutritional status . Physical activity . Advanced directives . List of other physicians . Vitals . Screenings to include cognitive, depression, and falls . Referrals and appointments  In addition, I have reviewed and discussed with patient certain preventive protocols, quality metrics, and best practice recommendations. A written personalized care plan for preventive services as well as general preventive health recommendations were provided to patient.     Michiel Cowboy, RN  08/27/2017

## 2017-08-28 ENCOUNTER — Ambulatory Visit: Payer: Medicare Other

## 2017-09-11 ENCOUNTER — Other Ambulatory Visit: Payer: Self-pay

## 2017-09-11 MED ORDER — SERTRALINE HCL 100 MG PO TABS: 100.0000 mg | ORAL_TABLET | Freq: Every day | ORAL | 0 refills | Status: DC

## 2017-09-17 DIAGNOSIS — M25512 Pain in left shoulder: Secondary | ICD-10-CM | POA: Insufficient documentation

## 2017-09-22 ENCOUNTER — Encounter: Payer: Self-pay | Admitting: Internal Medicine

## 2017-09-22 ENCOUNTER — Ambulatory Visit (INDEPENDENT_AMBULATORY_CARE_PROVIDER_SITE_OTHER): Payer: Medicare Other | Admitting: Internal Medicine

## 2017-09-22 DIAGNOSIS — F320 Major depressive disorder, single episode, mild: Secondary | ICD-10-CM

## 2017-09-22 DIAGNOSIS — K219 Gastro-esophageal reflux disease without esophagitis: Secondary | ICD-10-CM

## 2017-09-22 MED ORDER — SERTRALINE HCL 100 MG PO TABS: 100.0000 mg | ORAL_TABLET | Freq: Every day | ORAL | 3 refills | Status: DC

## 2017-09-22 MED ORDER — OMEPRAZOLE 20 MG PO CPDR: 20.0000 mg | DELAYED_RELEASE_CAPSULE | Freq: Every day | ORAL | 3 refills | Status: DC

## 2017-09-22 NOTE — Assessment & Plan Note (Signed)
Refill omeprazole and she uses it only rarely.

## 2017-09-22 NOTE — Progress Notes (Signed)
   Subjective:    Patient ID: Linda [name], female    DOB: 1941-08-10, 77 y.o.   MRN: [id number]  HPI The patient is a 77 YO female coming in for follow up of her depression (taking zoloft daily, needs refill, mild exacerbation today due to husband's cancer diagnosis in the last year, she denies SI/HI, she denies crying spells or guilt, she is just trying to take care of herself so she can care for her husband), and her GERD (taking omeprazole but rarely, sometimes with diet changes she does not have symptoms).   Review of Systems  Constitutional: Negative.   HENT: Negative.   Eyes: Negative.   Respiratory: Negative for cough, chest tightness and shortness of breath.   Cardiovascular: Negative for chest pain, palpitations and leg swelling.  Gastrointestinal: Negative for abdominal distention, abdominal pain, constipation, diarrhea, nausea and vomiting.  Musculoskeletal: Negative.   Skin: Negative.   Neurological: Negative.   Psychiatric/Behavioral: Positive for decreased concentration and dysphoric mood. Negative for behavioral problems, hallucinations, self-injury, sleep disturbance and suicidal ideas. The patient is not nervous/anxious and is not hyperactive.       Objective:   Physical Exam  Constitutional: She is oriented to person, place, and time. She appears well-developed and well-nourished.  HENT:  Head: Normocephalic and atraumatic.  Eyes: EOM are normal.  Neck: Normal range of motion.  Cardiovascular: Normal rate and regular rhythm.  Pulmonary/Chest: Effort normal and breath sounds normal. No respiratory distress. She has no wheezes. She has no rales.  Abdominal: Soft. Bowel sounds are normal. She exhibits no distension. There is no tenderness. There is no rebound.  Musculoskeletal: She exhibits no edema.  Neurological: She is alert and oriented to person, place, and time. Coordination normal.  Skin: Skin is warm and dry.  Psychiatric: She has a normal mood and affect.     Vitals:   09/22/17 1427  BP: 130/70  Pulse: 73  Temp: (!) 97.5 F (36.4 C)  TempSrc: Oral  SpO2: 98%  Weight: 143 lb (64.9 kg)  Height: 5\' 4"  (1.626 m)      Assessment & Plan:

## 2017-09-22 NOTE — Assessment & Plan Note (Signed)
Refill her zoloft 100 mg daily, she is still doing well and will continue. She is experiencing stress from husband's cancer diagnosis over the last year.

## 2017-09-22 NOTE — Patient Instructions (Signed)
We have sent in the refills for you. If you need anything let us know.

## 2017-10-15 DIAGNOSIS — M25512 Pain in left shoulder: Secondary | ICD-10-CM | POA: Diagnosis not present

## 2017-11-20 ENCOUNTER — Ambulatory Visit (INDEPENDENT_AMBULATORY_CARE_PROVIDER_SITE_OTHER): Payer: Medicare Other | Admitting: *Deleted

## 2017-11-20 VITALS — BP 116/62 | HR 76 | Resp 18 | Ht 64.0 in | Wt 143.0 lb

## 2017-11-20 DIAGNOSIS — Z0000 Encounter for general adult medical examination without abnormal findings: Secondary | ICD-10-CM

## 2017-11-20 NOTE — Progress Notes (Signed)
Subjective:   Linda [name] is a 77 y.o. female who presents for Medicare Annual (Subsequent) preventive examination.  Patient states she feels anxious and overwhelmed due to recent loss of her husband. She has contacted Hospice to start grief counseling. She is uncertain if the zoloft prescribed is effectively treating her emotions and depression.   Review of Systems:  No ROS.  Medicare Wellness Visit. Additional risk factors are reflected in the social history.  Cardiac Risk Factors include: advanced age (>60men, >83 women);dyslipidemia Sleep patterns: gets up 1-2 times nightly to void and sleeps 7 hours nightly.    Home Safety/Smoke Alarms: Feels safe in home. Smoke alarms in place.  Living environment; residence and Firearm Safety: 2-story house, no firearms., Lives alone, no needs for DME, good support system Seat Belt Safety/Bike Helmet: Wears seat belt.     Objective:     Vitals: BP 116/62   Pulse 76   Resp 18   Ht 5\' 4"  (1.626 m)   Wt 143 lb (64.9 kg)   SpO2 98%   BMI 24.55 kg/m   Body mass index is 24.55 kg/m.  Advanced Directives 08/22/2016 06/07/2015  Does Patient Have a Medical Advance Directive? Yes No;Yes  Type of Paramedic of [address];Living will [place];Living will  Copy of [place]ook[place] in Chart? No - copy requested -    Tobacco Social History   Tobacco Use  Smoking Status Former Smoker  . Last attempt to quit: 08/12/1988  . Years since quitting: 29.2  Smokeless Tobacco Never Used     Counseling given: Not Answered  Past Medical History:  Diagnosis Date  . ALLERGIC RHINITIS   . Allergy   . ANEMIA-IRON DEFICIENCY   . Anxiety state, unspecified   . Carpal tunnel syndrome    pt denies   . COLONIC POLYPS, HX OF 2007  . GERD   . GLAUCOMA   . GOITER, MULTINODULAR    on [place] 2006, unchanged 6/13 with nodules all <79mm  . HIATAL HERNIA WITH REFLUX   . Hip dysplasia, acquired    B sx;  eval at [facility] for same 01/2015 -ongoing PT  . OSTEOARTHRITIS   . TOBACCO USE, QUIT    Past Surgical History:  Procedure Laterality Date  . BREAST SURGERY  1960   Left breast cyst removed no complications  . COLONOSCOPY  2005  . EYE SURGERY  1991-1992   both eyes  . EYE SURGERY Left 2014   shunt behind left eye  . Right shoulder aurgery  10/2008   Family History  Problem Relation Age of Onset  . Diabetes Mother   . Heart disease Mother   . Arthritis Mother   . Hypertension Mother   . Arthritis Father   . Colon cancer Paternal Grandmother    Social History   Socioeconomic History  . Marital status: Widowed    Spouse name: Not on file  . Number of children: Not on file  . Years of education: Not on file  . Highest education level: Not on file  Occupational History  . Not on file  Social Needs  . Financial resource strain: Not hard at all  . Food insecurity:    Worry: Never true    Inability: Never true  . Transportation needs:    Medical: No    Non-medical: No  Tobacco Use  . Smoking status: Former Smoker    Last attempt to quit: 08/12/1988    Years  since quitting: 29.2  . Smokeless tobacco: Never Used  Substance and Sexual Activity  . Alcohol use: Yes    Alcohol/week: 0.0 oz    Comment: occassionally  . Drug use: No  . Sexual activity: Not Currently  Lifestyle  . Physical activity:    Days per week: 3 days    Minutes per session: 70 min  . Stress: Very much  Relationships  . Social connections:    Talks on phone: More than three times a week    Gets together: More than three times a week    Attends religious service: More than 4 times per year    Active member of club or organization: Yes    Attends meetings of clubs or organizations: More than 4 times per year    Relationship status: Widowed  Other Topics Concern  . 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 acetaminophen (TYLENOL ARTHRITIS PAIN) 650 MG CR tablet Take 1,300 mg by mouth 2 (two) times daily.   Marland Kitchen b complex vitamins tablet Take 1 tablet by mouth daily.    . dorzolamide-timolol (COSOPT) 22.3-6.8 MG/ML ophthalmic solution Place 1 drop into the left eye 2 (two) times daily.  Marland Kitchen gabapentin (NEURONTIN) 100 MG capsule Take 2 capsules (200 mg total) by mouth at bedtime. (Patient taking differently: 200 mg. Take two 300 mg tablets by mouth daily.)  . glucosamine-chondroitin 500-400 MG tablet Take 1 tablet by mouth 2 (two) times daily.    . iron polysaccharides (NIFEREX) 150 MG capsule Take 1 capsule (150 mg total) by mouth daily.  Marland Kitchen latanoprost (XALATAN) 0.005 % ophthalmic solution Place 1 drop into the right eye daily.   Marland Kitchen omeprazole (PRILOSEC) 20 MG capsule Take 1 capsule (20 mg total) by mouth daily.  . sertraline (ZOLOFT) 100 MG tablet Take 1 tablet (100 mg total) by mouth daily.   No facility-administered encounter medications on file as of 11/20/2017.     Activities of Daily Living In your present state of health, do you have any difficulty performing the following activities: 11/20/2017  Hearing? N  Vision? N  Difficulty concentrating or making decisions? N  Walking or climbing stairs? N  Dressing or bathing? N  Doing errands, shopping? N  Preparing Food and eating ? N  Using the Toilet? N  In the past six months, have you accidently leaked urine? N  Do you have problems with loss of bowel control? N  Managing your Medications? N  Managing your Finances? N  Housekeeping or managing your Housekeeping? N  Some recent data might be hidden    Patient Care Team: Hoyt Koch, MD as PCP - General (Internal Medicine) Melida Quitter, MD as Consulting Physician Princess Bruins, MD (Obstetrics and Gynecology) Suella Broad, MD (Physical Medicine and Rehabilitation) Bond, Tracie Harrier, MD (Ophthalmology) Katy Apo, MD (Ophthalmology) Kristeen Miss, MD as Consulting Physician (Neurosurgery) Pyrtle, Lajuan Lines, MD as Consulting Physician (Gastroenterology) Ignatius Specking (Dentistry)    Assessment:   This is a routine wellness examination for [place]. Physical assessment deferred to PCP.   Exercise Activities and Dietary recommendations Current Exercise Habits: Structured exercise class, Type of exercise: walking(water aerobics), Time (Minutes): 45, Frequency (Times/Week): 3, Weekly Exercise (Minutes/Week): 135, Intensity: Mild, Exercise limited by: orthopedic condition(s)  Diet (meal preparation, eat out, water intake, caffeinated beverages, dairy products, fruits and vegetables): in general, a "healthy" diet  ,  well balanced   Reviewed heart healthy diet, encouraged patient to increase daily water intake.   Goals    . Patient Stated     Continue to  Exercise,  eat healthier, go out with my friends be active socially through church and other activities.     . Weight (lb) < 140 lb (63.5 kg)     Would like to lose 11 pounds by staying active and making healthy food choices.        Fall Risk Fall Risk  11/20/2017 08/22/2016 04/11/2016 02/28/2015 08/10/2013  Falls in the past year? No Yes Yes No Exclusion - non ambulatory  Comment - - Emmi Telephone Survey: data to providers prior to load - -  Number falls in past yr: - 1 2 or more - -  Comment - - Emmi Telephone Survey Actual Response = 2 - -  Injury with Fall? - No No - -  Follow up - Falls prevention discussed - - -    Depression Screen PHQ 2/9 Scores 11/20/2017 08/22/2016 02/28/2015 08/10/2013  PHQ - 2 Score 3 0 1 1  PHQ- 9 Score 5 - - -     Cognitive Function       Ad8 score reviewed for issues:  Issues making decisions: no  Less interest in hobbies / activities: no  Repeats questions, stories (family complaining): no  Trouble using ordinary gadgets (microwave, computer, phone):no  Forgets the month or year: no  Mismanaging finances: no  Remembering appts:  no  Daily problems with thinking and/or memory: no Ad8 score is= 0  Immunization History  Administered Date(s) Administered  . Influenza Split 05/12/2012  . Influenza Whole 05/12/2009, 05/29/2010  . Influenza, High Dose Seasonal PF 06/12/2013, 06/05/2016  . Influenza-Unspecified 05/12/2014, 05/27/2015, 05/12/2017  . Pneumococcal Conjugate-13 08/10/2013  . Pneumococcal Polysaccharide-23 07/12/2011  . Td 01/13/2004  . Tdap 02/23/2014  . Zoster 01/27/2008, 05/12/2009   Screening Tests Health Maintenance  Topic Date Due  . INFLUENZA VACCINE  03/12/2018  . COLONOSCOPY  06/27/2020  . TETANUS/TDAP  02/24/2024  . DEXA SCAN  Completed  . PNA vac Low Risk Adult  Completed      Plan:     Scheduled an appointment with PCP on 11/24/17 to address patient's anxiety and depression.  Continue doing brain stimulating activities (puzzles, reading, adult coloring books, staying active) to keep memory [place].   Continue to eat heart healthy diet (full of fruits, vegetables, whole grains, lean protein, water--limit salt, fat, and sugar intake) and increase physical activity as tolerated.  I have personally reviewed and noted the following in the patient's chart:   . Medical and social history . Use of alcohol, tobacco or illicit drugs  . Current medications and supplements . Functional ability and status . Nutritional status . Physical activity . Advanced directives . List of other physicians . Vitals . Screenings to include cognitive, depression, and falls . Referrals and appointments  In addition, I have reviewed and discussed with patient certain preventive protocols, quality metrics, and best practice recommendations. A written personalized care plan for preventive services as well as general preventive health recommendations were provided to patient.     Michiel Cowboy, RN  11/20/2017

## 2017-11-20 NOTE — Patient Instructions (Addendum)
Continue doing brain stimulating activities (puzzles, reading, adult coloring books, staying active) to keep memory Linda.   Continue to eat heart healthy diet (full of fruits, vegetables, whole grains, lean protein, water--limit salt, fat, and sugar intake) and increase physical activity as tolerated.   Ms. Linda [name] , Thank you for taking time to come for your Medicare Wellness Visit. I appreciate your ongoing commitment to your health goals. Please review the following plan we discussed and let me know if I can assist you in the future.   These are the goals we discussed: Goals    . Patient Stated     Continue to  Exercise,  eat healthier, go out with my friends be active socially through church and other activities.     . Weight (lb) < 140 lb (63.5 kg)     Would like to lose 11 pounds by staying active and making healthy food choices.        This is a list of the screening recommended for you and due dates:  Health Maintenance  Topic Date Due  . Flu Shot  03/12/2018  . Colon Cancer Screening  06/27/2020  . Tetanus Vaccine  02/24/2024  . DEXA scan (bone density measurement)  Completed  . Pneumonia vaccines  Completed    Health Maintenance, Female Adopting a healthy lifestyle and getting preventive care can go a long way to promote health and wellness. Talk with your health care provider about what schedule of regular examinations is right for you. This is a good chance for you to check in with your provider about disease prevention and staying healthy. In between checkups, there are plenty of things you can do on your own. Experts have done a lot of research about which lifestyle changes and preventive measures are most likely to keep you healthy. Ask your health care provider for more information. Weight and diet Eat a healthy diet  Be sure to include plenty of vegetables, fruits, low-fat dairy products, and lean protein.  Do not eat a lot of foods high in solid fats, added  sugars, or salt.  Get regular exercise. This is one of the most important things you can do for your health. ? Most adults should exercise for at least 150 minutes each week. The exercise should increase your heart rate and make you sweat (moderate-intensity exercise). ? Most adults should also do strengthening exercises at least twice a week. This is in addition to the moderate-intensity exercise.  Maintain a healthy weight  Body mass index (BMI) is a measurement that can be used to identify possible weight problems. It estimates body fat based on height and weight. Your health care provider can help determine your BMI and help you achieve or maintain a healthy weight.  For females 70 years of age and older: ? A BMI below 18.5 is considered underweight. ? A BMI of 18.5 to 24.9 is normal. ? A BMI of 25 to 29.9 is considered overweight. ? A BMI of 30 and above is considered obese.  Watch levels of cholesterol and blood lipids  You should start having your blood tested for lipids and cholesterol at 77 years of age, then have this test every 5 years.  You may need to have your cholesterol levels checked more often if: ? Your lipid or cholesterol levels are high. ? You are older than 77 years of age. ? You are at high risk for heart disease.  Cancer screening Lung Cancer  Lung cancer screening is  recommended for adults 41-77 years old who are at high risk for lung cancer because of a history of smoking.  A yearly low-dose CT scan of the lungs is recommended for people who: ? Currently smoke. ? Have quit within the past 15 years. ? Have at least a 30-pack-year history of smoking. A pack year is smoking an average of one pack of cigarettes a day for 1 year.  Yearly screening should continue until it has been 15 years since you quit.  Yearly screening should stop if you develop a health problem that would prevent you from having lung cancer treatment.  Breast Cancer  Practice breast  self-awareness. This means understanding how your breasts normally appear and feel.  It also means doing regular breast self-exams. Let your health care provider know about any changes, no matter how small.  If you are in your 20s or 30s, you should have a clinical breast exam (CBE) by a health care provider every 1-3 years as part of a regular health exam.  If you are 35 or older, have a CBE every year. Also consider having a breast X-ray (mammogram) every year.  If you have a family history of breast cancer, talk to your health care provider about genetic screening.  If you are at high risk for breast cancer, talk to your health care provider about having an MRI and a mammogram every year.  Breast cancer gene (BRCA) assessment is recommended for women who have family members with BRCA-related cancers. BRCA-related cancers include: ? Breast. ? Ovarian. ? Tubal. ? Peritoneal cancers.  Results of the assessment will determine the need for genetic counseling and BRCA1 and BRCA2 testing.  Cervical Cancer Your health care provider may recommend that you be screened regularly for cancer of the pelvic organs (ovaries, uterus, and vagina). This screening involves a pelvic examination, including checking for microscopic changes to the surface of your cervix (Pap test). You may be encouraged to have this screening done every 3 years, beginning at age 72.  For women ages 50-65, health care providers may recommend pelvic exams and Pap testing every 3 years, or they may recommend the Pap and pelvic exam, combined with testing for human papilloma virus (HPV), every 5 years. Some types of HPV increase your risk of cervical cancer. Testing for HPV may also be done on women of any age with unclear Pap test results.  Other health care providers may not recommend any screening for nonpregnant women who are considered low risk for pelvic cancer and who do not have symptoms. Ask your health care provider if a  screening pelvic exam is right for you.  If you have had past treatment for cervical cancer or a condition that could lead to cancer, you need Pap tests and screening for cancer for at least 20 years after your treatment. If Pap tests have been discontinued, your risk factors (such as having a new sexual partner) need to be reassessed to determine if screening should resume. Some women have medical problems that increase the chance of getting cervical cancer. In these cases, your health care provider may recommend more frequent screening and Pap tests.  Colorectal Cancer  This type of cancer can be detected and often prevented.  Routine colorectal cancer screening usually begins at 77 years of age and continues through 77 years of age.  Your health care provider may recommend screening at an earlier age if you have risk factors for colon cancer.  Your health care provider may  also recommend using home test kits to check for hidden blood in the stool.  A small camera at the end of a tube can be used to examine your colon directly (sigmoidoscopy or colonoscopy). This is done to check for the earliest forms of colorectal cancer.  Routine screening usually begins at age 63.  Direct examination of the colon should be repeated every 5-10 years through 77 years of age. However, you may need to be screened more often if early forms of precancerous polyps or small growths are found.  Skin Cancer  Check your skin from head to toe regularly.  Tell your health care provider about any new moles or changes in moles, especially if there is a change in a mole's shape or color.  Also tell your health care provider if you have a mole that is larger than the size of a pencil eraser.  Always use sunscreen. Apply sunscreen liberally and repeatedly throughout the day.  Protect yourself by wearing long sleeves, pants, a wide-brimmed hat, and sunglasses whenever you are outside.  Heart disease, diabetes, and  high blood pressure  High blood pressure causes heart disease and increases the risk of stroke. High blood pressure is more likely to develop in: ? People who have blood pressure in the high end of the normal range (130-139/85-89 mm Hg). ? People who are overweight or obese. ? People who are African American.  If you are 1-39 years of age, have your blood pressure checked every 3-5 years. If you are 53 years of age or older, have your blood pressure checked every year. You should have your blood pressure measured twice-once when you are at a hospital or clinic, and once when you are not at a hospital or clinic. Record the average of the two measurements. To check your blood pressure when you are not at a hospital or clinic, you can use: ? An automated blood pressure machine at a pharmacy. ? A home blood pressure monitor.  If you are between 82 years and 65 years old, ask your health care provider if you should take aspirin to prevent strokes.  Have regular diabetes screenings. This involves taking a blood sample to check your fasting blood sugar level. ? If you are at a normal weight and have a low risk for diabetes, have this test once every three years after 77 years of age. ? If you are overweight and have a high risk for diabetes, consider being tested at a younger age or more often. Preventing infection Hepatitis B  If you have a higher risk for hepatitis B, you should be screened for this virus. You are considered at high risk for hepatitis B if: ? You were born in a country where hepatitis B is common. Ask your health care provider which countries are considered high risk. ? Your parents were born in a high-risk country, and you have not been immunized against hepatitis B (hepatitis B vaccine). ? You have HIV or AIDS. ? You use needles to inject street drugs. ? You live with someone who has hepatitis B. ? You have had sex with someone who has hepatitis B. ? You get hemodialysis  treatment. ? You take certain medicines for conditions, including cancer, organ transplantation, and autoimmune conditions.  Hepatitis C  Blood testing is recommended for: ? Everyone born from [birth year] through 1965. ? Anyone with known risk factors for hepatitis C.  Sexually transmitted infections (STIs)  You should be screened for sexually transmitted infections (STIs)  including gonorrhea and chlamydia if: ? You are sexually active and are younger than 77 years of age. ? You are older than 77 years of age and your health care provider tells you that you are at risk for this type of infection. ? Your sexual activity has changed since you were last screened and you are at an increased risk for chlamydia or gonorrhea. Ask your health care provider if you are at risk.  If you do not have HIV, but are at risk, it may be recommended that you take a prescription medicine daily to prevent HIV infection. This is called pre-exposure prophylaxis (PrEP). You are considered at risk if: ? You are sexually active and do not regularly use condoms or know the HIV status of your partner(s). ? You take drugs by injection. ? You are sexually active with a partner who has HIV.  Talk with your health care provider about whether you are at high risk of being infected with HIV. If you choose to begin PrEP, you should first be tested for HIV. You should then be tested every 3 months for as long as you are taking PrEP. Pregnancy  If you are premenopausal and you may become pregnant, ask your health care provider about preconception counseling.  If you may become pregnant, take 400 to 800 micrograms (mcg) of folic acid every day.  If you want to prevent pregnancy, talk to your health care provider about birth control (contraception). Osteoporosis and menopause  Osteoporosis is a disease in which the bones lose minerals and strength with aging. This can result in serious bone fractures. Your risk for osteoporosis  can be identified using a bone density scan.  If you are 59 years of age or older, or if you are at risk for osteoporosis and fractures, ask your health care provider if you should be screened.  Ask your health care provider whether you should take a calcium or vitamin D supplement to lower your risk for osteoporosis.  Menopause may have certain physical symptoms and risks.  Hormone replacement therapy may reduce some of these symptoms and risks. Talk to your health care provider about whether hormone replacement therapy is right for you. Follow these instructions at home:  Schedule regular health, dental, and eye exams.  Stay current with your immunizations.  Do not use any tobacco products including cigarettes, chewing tobacco, or electronic cigarettes.  If you are pregnant, do not drink alcohol.  If you are breastfeeding, limit how much and how often you drink alcohol.  Limit alcohol intake to no more than 1 drink per day for nonpregnant women. One drink equals 12 ounces of beer, 5 ounces of wine, or 1 ounces of hard liquor.  Do not use street drugs.  Do not share needles.  Ask your health care provider for help if you need support or information about quitting drugs.  Tell your health care provider if you often feel depressed.  Tell your health care provider if you have ever been abused or do not feel safe at home. This information is not intended to replace advice given to you by your health care provider. Make sure you discuss any questions you have with your health care provider. Document Released: 02/11/2011 Document Revised: 01/04/2016 Document Reviewed: 05/02/2015 Elsevier Interactive Patient Education  [employer].

## 2017-11-21 NOTE — Progress Notes (Signed)
Medical screening examination/treatment/procedure(s) were performed by non-physician practitioner and as supervising physician I was immediately available for consultation/collaboration. I agree with above. Aralynn A Edward Guthmiller, MD 

## 2017-11-24 ENCOUNTER — Encounter: Payer: Self-pay | Admitting: Internal Medicine

## 2017-11-24 ENCOUNTER — Ambulatory Visit (INDEPENDENT_AMBULATORY_CARE_PROVIDER_SITE_OTHER): Payer: Medicare Other | Admitting: Internal Medicine

## 2017-11-24 ENCOUNTER — Other Ambulatory Visit (INDEPENDENT_AMBULATORY_CARE_PROVIDER_SITE_OTHER): Payer: Medicare Other

## 2017-11-24 VITALS — BP 122/74 | HR 77 | Temp 97.8°F | Ht 64.0 in | Wt 143.0 lb

## 2017-11-24 DIAGNOSIS — D508 Other iron deficiency anemias: Secondary | ICD-10-CM

## 2017-11-24 DIAGNOSIS — Z13220 Encounter for screening for lipoid disorders: Secondary | ICD-10-CM

## 2017-11-24 DIAGNOSIS — F320 Major depressive disorder, single episode, mild: Secondary | ICD-10-CM

## 2017-11-24 LAB — COMPREHENSIVE METABOLIC PANEL
ALBUMIN: 4.4 g/dL (ref 3.5–5.2)
ALT: 12 U/L (ref 0–35)
AST: 15 U/L (ref 0–37)
Alkaline Phosphatase: 71 U/L (ref 39–117)
BUN: 15 mg/dL (ref 6–23)
CO2: 26 mEq/L (ref 19–32)
CREATININE: 0.65 mg/dL (ref 0.40–1.20)
Calcium: 9.3 mg/dL (ref 8.4–10.5)
Chloride: 103 mEq/L (ref 96–112)
GFR: 93.88 mL/min (ref >60.00)
GLUCOSE: 117 mg/dL — AB (ref 70–99)
Potassium: 4 mEq/L (ref 3.5–5.1)
SODIUM: 136 meq/L (ref 135–145)
Total Bilirubin: 0.4 mg/dL (ref 0.2–1.2)
Total Protein: 6.9 g/dL (ref 6.0–8.3)

## 2017-11-24 LAB — CBC
HCT: 38.5 % (ref 36.0–46.0)
Hemoglobin: 12.8 g/dL (ref 12.0–15.0)
MCHC: 33.3 g/dL (ref 30.0–36.0)
MCV: 90.8 fl (ref 78.0–100.0)
Platelets: 258 10*3/uL (ref 150.0–400.0)
RBC: 4.24 Mil/uL (ref 3.87–5.11)
RDW: 14.8 % (ref 11.5–15.5)
WBC: 9.6 10*3/uL (ref 4.0–10.5)

## 2017-11-24 LAB — LIPID PANEL
CHOLESTEROL: 188 mg/dL (ref 0–200)
HDL: 63.2 mg/dL (ref >39.00)
NONHDL: 125.26
Total CHOL/HDL Ratio: 3
Triglycerides: 208 mg/dL — ABNORMAL HIGH (ref 0.0–149.0)
VLDL: 41.6 mg/dL — AB (ref 0.0–40.0)

## 2017-11-24 LAB — LDL CHOLESTEROL, DIRECT: LDL DIRECT: 111 mg/dL

## 2017-11-24 MED ORDER — BUSPIRONE HCL 5 MG PO TABS: 5.0000 mg | ORAL_TABLET | Freq: Two times a day (BID) | ORAL | 3 refills | Status: DC | PRN

## 2017-11-24 NOTE — Patient Instructions (Signed)
We have sent in buspar that you can take 1/2 or 1 if needed.

## 2017-11-24 NOTE — Progress Notes (Signed)
   Subjective:    Patient ID: Linda [name], female    DOB: [birth year], 77 y.o.   MRN: [id number]  HPI The patient is a 77 YO female coming in for worsening anxiety. 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 She is struggling to cope with this. She did well at first and then several weeks ago this just hit her emotionally. She is doing some group sessions at hospice for counseling. She does have a support network. Denies SI/HI. Getting some anxious about leaving the house. Sleeping okay. Taking zoloft and still feels like that it doing well, no depression. Wants something short term to help if needed.   Review of Systems  Constitutional: Negative.   Respiratory: Negative for cough, chest tightness and shortness of breath.   Cardiovascular: Negative for chest pain, palpitations and leg swelling.  Gastrointestinal: Negative for abdominal distention, abdominal pain, constipation, diarrhea, nausea and vomiting.  Musculoskeletal: Negative.   Skin: Negative.   Neurological: Negative.   Psychiatric/Behavioral: Positive for dysphoric mood. Negative for behavioral problems, confusion, decreased concentration, hallucinations, self-injury, sleep disturbance and suicidal ideas. The patient is nervous/anxious. The patient is not hyperactive.       Objective:   Physical Exam  Constitutional: She is oriented to person, place, and time. She appears well-developed and well-nourished.  HENT:  Head: Normocephalic and atraumatic.  Eyes: EOM are normal.  Neck: Normal range of motion.  Cardiovascular: Normal rate and regular rhythm.  Pulmonary/Chest: Effort normal.  Abdominal: Soft.  Musculoskeletal: She exhibits no edema.  Neurological: She is alert and oriented to person, place, and time. Coordination normal.  Skin: Skin is warm and dry.  Psychiatric:  Mood appropriate.    Vitals:   11/24/17 1535  BP: 122/74  Pulse: 77  Temp: 97.8 F (36.6 C)    TempSrc: Oral  SpO2: 96%  Weight: 143 lb (64.9 kg)  Height: 5\' 4"  (1.626 m)      Assessment & Plan:

## 2017-11-25 ENCOUNTER — Encounter: Payer: Self-pay | Admitting: Internal Medicine

## 2017-11-25 NOTE — Assessment & Plan Note (Signed)
Continue zoloft, add buspar if needed for anxiety short term. Advised to continue with hospice counseling as this will be most helpful. Lean on support network.

## 2017-11-27 ENCOUNTER — Other Ambulatory Visit: Payer: Self-pay | Admitting: Internal Medicine

## 2018-01-19 DIAGNOSIS — M79641 Pain in right hand: Secondary | ICD-10-CM | POA: Diagnosis not present

## 2018-01-19 DIAGNOSIS — M720 Palmar fascial fibromatosis [Dupuytren]: Secondary | ICD-10-CM | POA: Insufficient documentation

## 2018-01-26 DIAGNOSIS — H401134 Primary open-angle glaucoma, bilateral, indeterminate stage: Secondary | ICD-10-CM | POA: Diagnosis not present

## 2018-02-18 DIAGNOSIS — H5010 Unspecified exotropia: Secondary | ICD-10-CM | POA: Diagnosis not present

## 2018-02-25 DIAGNOSIS — M65841 Other synovitis and tenosynovitis, right hand: Secondary | ICD-10-CM | POA: Diagnosis not present

## 2018-02-25 DIAGNOSIS — M79641 Pain in right hand: Secondary | ICD-10-CM | POA: Diagnosis not present

## 2018-03-04 DIAGNOSIS — H401134 Primary open-angle glaucoma, bilateral, indeterminate stage: Secondary | ICD-10-CM | POA: Diagnosis not present

## 2018-03-06 DIAGNOSIS — D225 Melanocytic nevi of trunk: Secondary | ICD-10-CM | POA: Diagnosis not present

## 2018-03-06 DIAGNOSIS — L821 Other seborrheic keratosis: Secondary | ICD-10-CM | POA: Diagnosis not present

## 2018-03-06 DIAGNOSIS — L812 Freckles: Secondary | ICD-10-CM | POA: Diagnosis not present

## 2018-03-06 DIAGNOSIS — L820 Inflamed seborrheic keratosis: Secondary | ICD-10-CM | POA: Diagnosis not present

## 2018-03-10 DIAGNOSIS — M8589 Other specified disorders of bone density and structure, multiple sites: Secondary | ICD-10-CM | POA: Diagnosis not present

## 2018-03-10 DIAGNOSIS — Z1231 Encounter for screening mammogram for malignant neoplasm of breast: Secondary | ICD-10-CM | POA: Diagnosis not present

## 2018-03-10 LAB — HM MAMMOGRAPHY

## 2018-03-10 LAB — HM DEXA SCAN: HM Dexa Scan: -2.1

## 2018-03-11 ENCOUNTER — Encounter: Payer: Self-pay | Admitting: Internal Medicine

## 2018-03-11 NOTE — Progress Notes (Signed)
Abstracted and sent to scan  

## 2018-03-12 ENCOUNTER — Encounter: Payer: Self-pay | Admitting: Internal Medicine

## 2018-03-12 NOTE — Progress Notes (Signed)
Abstracted and sent to scan  

## 2018-03-23 DIAGNOSIS — M47816 Spondylosis without myelopathy or radiculopathy, lumbar region: Secondary | ICD-10-CM | POA: Diagnosis not present

## 2018-03-23 DIAGNOSIS — M9903 Segmental and somatic dysfunction of lumbar region: Secondary | ICD-10-CM | POA: Diagnosis not present

## 2018-03-23 DIAGNOSIS — M5127 Other intervertebral disc displacement, lumbosacral region: Secondary | ICD-10-CM | POA: Diagnosis not present

## 2018-03-23 DIAGNOSIS — M791 Myalgia, unspecified site: Secondary | ICD-10-CM | POA: Diagnosis not present

## 2018-03-23 DIAGNOSIS — M545 Low back pain: Secondary | ICD-10-CM | POA: Diagnosis not present

## 2018-03-23 DIAGNOSIS — M5136 Other intervertebral disc degeneration, lumbar region: Secondary | ICD-10-CM | POA: Diagnosis not present

## 2018-03-23 DIAGNOSIS — M6240 Contracture of muscle, unspecified site: Secondary | ICD-10-CM | POA: Diagnosis not present

## 2018-03-23 DIAGNOSIS — M9901 Segmental and somatic dysfunction of cervical region: Secondary | ICD-10-CM | POA: Diagnosis not present

## 2018-03-23 DIAGNOSIS — M5137 Other intervertebral disc degeneration, lumbosacral region: Secondary | ICD-10-CM | POA: Diagnosis not present

## 2018-04-01 DIAGNOSIS — M5136 Other intervertebral disc degeneration, lumbar region: Secondary | ICD-10-CM | POA: Diagnosis not present

## 2018-04-01 DIAGNOSIS — M47816 Spondylosis without myelopathy or radiculopathy, lumbar region: Secondary | ICD-10-CM | POA: Diagnosis not present

## 2018-04-01 DIAGNOSIS — M5137 Other intervertebral disc degeneration, lumbosacral region: Secondary | ICD-10-CM | POA: Diagnosis not present

## 2018-04-01 DIAGNOSIS — M545 Low back pain: Secondary | ICD-10-CM | POA: Diagnosis not present

## 2018-04-01 DIAGNOSIS — M791 Myalgia, unspecified site: Secondary | ICD-10-CM | POA: Diagnosis not present

## 2018-04-01 DIAGNOSIS — M6240 Contracture of muscle, unspecified site: Secondary | ICD-10-CM | POA: Diagnosis not present

## 2018-04-01 DIAGNOSIS — M9901 Segmental and somatic dysfunction of cervical region: Secondary | ICD-10-CM | POA: Diagnosis not present

## 2018-04-01 DIAGNOSIS — M5127 Other intervertebral disc displacement, lumbosacral region: Secondary | ICD-10-CM | POA: Diagnosis not present

## 2018-04-01 DIAGNOSIS — M9903 Segmental and somatic dysfunction of lumbar region: Secondary | ICD-10-CM | POA: Diagnosis not present

## 2018-04-09 DIAGNOSIS — H5010 Unspecified exotropia: Secondary | ICD-10-CM | POA: Diagnosis not present

## 2018-04-09 DIAGNOSIS — Z961 Presence of intraocular lens: Secondary | ICD-10-CM | POA: Diagnosis not present

## 2018-04-14 DIAGNOSIS — M6240 Contracture of muscle, unspecified site: Secondary | ICD-10-CM | POA: Diagnosis not present

## 2018-04-14 DIAGNOSIS — M5136 Other intervertebral disc degeneration, lumbar region: Secondary | ICD-10-CM | POA: Diagnosis not present

## 2018-04-14 DIAGNOSIS — M5127 Other intervertebral disc displacement, lumbosacral region: Secondary | ICD-10-CM | POA: Diagnosis not present

## 2018-04-14 DIAGNOSIS — M791 Myalgia, unspecified site: Secondary | ICD-10-CM | POA: Diagnosis not present

## 2018-04-14 DIAGNOSIS — M542 Cervicalgia: Secondary | ICD-10-CM | POA: Diagnosis not present

## 2018-04-14 DIAGNOSIS — M9903 Segmental and somatic dysfunction of lumbar region: Secondary | ICD-10-CM | POA: Diagnosis not present

## 2018-04-14 DIAGNOSIS — M5137 Other intervertebral disc degeneration, lumbosacral region: Secondary | ICD-10-CM | POA: Diagnosis not present

## 2018-04-14 DIAGNOSIS — M47816 Spondylosis without myelopathy or radiculopathy, lumbar region: Secondary | ICD-10-CM | POA: Diagnosis not present

## 2018-04-14 DIAGNOSIS — M9901 Segmental and somatic dysfunction of cervical region: Secondary | ICD-10-CM | POA: Diagnosis not present

## 2018-04-15 DIAGNOSIS — M9901 Segmental and somatic dysfunction of cervical region: Secondary | ICD-10-CM | POA: Diagnosis not present

## 2018-04-15 DIAGNOSIS — M5136 Other intervertebral disc degeneration, lumbar region: Secondary | ICD-10-CM | POA: Diagnosis not present

## 2018-04-15 DIAGNOSIS — M791 Myalgia, unspecified site: Secondary | ICD-10-CM | POA: Diagnosis not present

## 2018-04-15 DIAGNOSIS — M47816 Spondylosis without myelopathy or radiculopathy, lumbar region: Secondary | ICD-10-CM | POA: Diagnosis not present

## 2018-04-15 DIAGNOSIS — M545 Low back pain: Secondary | ICD-10-CM | POA: Diagnosis not present

## 2018-04-15 DIAGNOSIS — M9903 Segmental and somatic dysfunction of lumbar region: Secondary | ICD-10-CM | POA: Diagnosis not present

## 2018-04-15 DIAGNOSIS — M5137 Other intervertebral disc degeneration, lumbosacral region: Secondary | ICD-10-CM | POA: Diagnosis not present

## 2018-04-15 DIAGNOSIS — M5127 Other intervertebral disc displacement, lumbosacral region: Secondary | ICD-10-CM | POA: Diagnosis not present

## 2018-04-15 DIAGNOSIS — M6240 Contracture of muscle, unspecified site: Secondary | ICD-10-CM | POA: Diagnosis not present

## 2018-04-22 DIAGNOSIS — M5127 Other intervertebral disc displacement, lumbosacral region: Secondary | ICD-10-CM | POA: Diagnosis not present

## 2018-04-22 DIAGNOSIS — M9901 Segmental and somatic dysfunction of cervical region: Secondary | ICD-10-CM | POA: Diagnosis not present

## 2018-04-22 DIAGNOSIS — M5137 Other intervertebral disc degeneration, lumbosacral region: Secondary | ICD-10-CM | POA: Diagnosis not present

## 2018-04-22 DIAGNOSIS — M9903 Segmental and somatic dysfunction of lumbar region: Secondary | ICD-10-CM | POA: Diagnosis not present

## 2018-04-22 DIAGNOSIS — M791 Myalgia, unspecified site: Secondary | ICD-10-CM | POA: Diagnosis not present

## 2018-04-22 DIAGNOSIS — M6240 Contracture of muscle, unspecified site: Secondary | ICD-10-CM | POA: Diagnosis not present

## 2018-04-22 DIAGNOSIS — M47816 Spondylosis without myelopathy or radiculopathy, lumbar region: Secondary | ICD-10-CM | POA: Diagnosis not present

## 2018-04-22 DIAGNOSIS — M5136 Other intervertebral disc degeneration, lumbar region: Secondary | ICD-10-CM | POA: Diagnosis not present

## 2018-04-22 DIAGNOSIS — M542 Cervicalgia: Secondary | ICD-10-CM | POA: Diagnosis not present

## 2018-04-29 DIAGNOSIS — M6240 Contracture of muscle, unspecified site: Secondary | ICD-10-CM | POA: Diagnosis not present

## 2018-04-29 DIAGNOSIS — M542 Cervicalgia: Secondary | ICD-10-CM | POA: Diagnosis not present

## 2018-04-29 DIAGNOSIS — M791 Myalgia, unspecified site: Secondary | ICD-10-CM | POA: Diagnosis not present

## 2018-04-29 DIAGNOSIS — M5136 Other intervertebral disc degeneration, lumbar region: Secondary | ICD-10-CM | POA: Diagnosis not present

## 2018-04-29 DIAGNOSIS — M5137 Other intervertebral disc degeneration, lumbosacral region: Secondary | ICD-10-CM | POA: Diagnosis not present

## 2018-04-29 DIAGNOSIS — M47816 Spondylosis without myelopathy or radiculopathy, lumbar region: Secondary | ICD-10-CM | POA: Diagnosis not present

## 2018-04-29 DIAGNOSIS — M5127 Other intervertebral disc displacement, lumbosacral region: Secondary | ICD-10-CM | POA: Diagnosis not present

## 2018-04-29 DIAGNOSIS — M9903 Segmental and somatic dysfunction of lumbar region: Secondary | ICD-10-CM | POA: Diagnosis not present

## 2018-04-29 DIAGNOSIS — M9901 Segmental and somatic dysfunction of cervical region: Secondary | ICD-10-CM | POA: Diagnosis not present

## 2018-04-30 DIAGNOSIS — G5601 Carpal tunnel syndrome, right upper limb: Secondary | ICD-10-CM | POA: Diagnosis not present

## 2018-04-30 DIAGNOSIS — M79641 Pain in right hand: Secondary | ICD-10-CM | POA: Diagnosis not present

## 2018-04-30 DIAGNOSIS — M65841 Other synovitis and tenosynovitis, right hand: Secondary | ICD-10-CM | POA: Diagnosis not present

## 2018-05-06 DIAGNOSIS — M9901 Segmental and somatic dysfunction of cervical region: Secondary | ICD-10-CM | POA: Diagnosis not present

## 2018-05-06 DIAGNOSIS — M6240 Contracture of muscle, unspecified site: Secondary | ICD-10-CM | POA: Diagnosis not present

## 2018-05-06 DIAGNOSIS — M50223 Other cervical disc displacement at C6-C7 level: Secondary | ICD-10-CM | POA: Diagnosis not present

## 2018-05-06 DIAGNOSIS — M542 Cervicalgia: Secondary | ICD-10-CM | POA: Diagnosis not present

## 2018-05-06 DIAGNOSIS — M791 Myalgia, unspecified site: Secondary | ICD-10-CM | POA: Diagnosis not present

## 2018-05-06 DIAGNOSIS — M5136 Other intervertebral disc degeneration, lumbar region: Secondary | ICD-10-CM | POA: Diagnosis not present

## 2018-05-06 DIAGNOSIS — M50323 Other cervical disc degeneration at C6-C7 level: Secondary | ICD-10-CM | POA: Diagnosis not present

## 2018-05-06 DIAGNOSIS — M9903 Segmental and somatic dysfunction of lumbar region: Secondary | ICD-10-CM | POA: Diagnosis not present

## 2018-05-06 DIAGNOSIS — M4722 Other spondylosis with radiculopathy, cervical region: Secondary | ICD-10-CM | POA: Diagnosis not present

## 2018-05-13 DIAGNOSIS — M9901 Segmental and somatic dysfunction of cervical region: Secondary | ICD-10-CM | POA: Diagnosis not present

## 2018-05-13 DIAGNOSIS — M542 Cervicalgia: Secondary | ICD-10-CM | POA: Diagnosis not present

## 2018-05-13 DIAGNOSIS — M791 Myalgia, unspecified site: Secondary | ICD-10-CM | POA: Diagnosis not present

## 2018-05-13 DIAGNOSIS — M6240 Contracture of muscle, unspecified site: Secondary | ICD-10-CM | POA: Diagnosis not present

## 2018-05-13 DIAGNOSIS — M50323 Other cervical disc degeneration at C6-C7 level: Secondary | ICD-10-CM | POA: Diagnosis not present

## 2018-05-13 DIAGNOSIS — M4722 Other spondylosis with radiculopathy, cervical region: Secondary | ICD-10-CM | POA: Diagnosis not present

## 2018-05-13 DIAGNOSIS — M50223 Other cervical disc displacement at C6-C7 level: Secondary | ICD-10-CM | POA: Diagnosis not present

## 2018-05-13 DIAGNOSIS — M5136 Other intervertebral disc degeneration, lumbar region: Secondary | ICD-10-CM | POA: Diagnosis not present

## 2018-05-13 DIAGNOSIS — M9903 Segmental and somatic dysfunction of lumbar region: Secondary | ICD-10-CM | POA: Diagnosis not present

## 2018-05-20 DIAGNOSIS — M50223 Other cervical disc displacement at C6-C7 level: Secondary | ICD-10-CM | POA: Diagnosis not present

## 2018-05-20 DIAGNOSIS — M5136 Other intervertebral disc degeneration, lumbar region: Secondary | ICD-10-CM | POA: Diagnosis not present

## 2018-05-20 DIAGNOSIS — M791 Myalgia, unspecified site: Secondary | ICD-10-CM | POA: Diagnosis not present

## 2018-05-20 DIAGNOSIS — M6240 Contracture of muscle, unspecified site: Secondary | ICD-10-CM | POA: Diagnosis not present

## 2018-05-20 DIAGNOSIS — M4722 Other spondylosis with radiculopathy, cervical region: Secondary | ICD-10-CM | POA: Diagnosis not present

## 2018-05-20 DIAGNOSIS — M9903 Segmental and somatic dysfunction of lumbar region: Secondary | ICD-10-CM | POA: Diagnosis not present

## 2018-05-20 DIAGNOSIS — M50323 Other cervical disc degeneration at C6-C7 level: Secondary | ICD-10-CM | POA: Diagnosis not present

## 2018-05-20 DIAGNOSIS — M9901 Segmental and somatic dysfunction of cervical region: Secondary | ICD-10-CM | POA: Diagnosis not present

## 2018-05-27 DIAGNOSIS — Z23 Encounter for immunization: Secondary | ICD-10-CM | POA: Diagnosis not present

## 2018-06-02 ENCOUNTER — Telehealth: Payer: Self-pay | Admitting: Internal Medicine

## 2018-06-02 NOTE — Telephone Encounter (Signed)
LVM informing patient that she is all caught up on the pneumonia vaccines and will have to get the shingles vaccine at a pharmacy since patient is on medicare. Also informed patient that she is due for her flu shot and if she has had it already to call back and let us know when she got it

## 2018-06-02 NOTE — Telephone Encounter (Signed)
Copied from [place] [id number]. Topic: General - Inquiry >> Jun 02, 2018  1:47 PM Margot Ables wrote: Reason for CRM: pt called to see if she needs a pneumonia booster and a shingles vaccine. Pt notes that she DOES NOT use mychart and will not do it anymore (deactived per request). Please advise. Pt states ok to leave her detailed msgs on her phone # [phone].

## 2018-06-03 DIAGNOSIS — M9903 Segmental and somatic dysfunction of lumbar region: Secondary | ICD-10-CM | POA: Diagnosis not present

## 2018-06-03 DIAGNOSIS — M6240 Contracture of muscle, unspecified site: Secondary | ICD-10-CM | POA: Diagnosis not present

## 2018-06-03 DIAGNOSIS — M5136 Other intervertebral disc degeneration, lumbar region: Secondary | ICD-10-CM | POA: Diagnosis not present

## 2018-06-03 DIAGNOSIS — M791 Myalgia, unspecified site: Secondary | ICD-10-CM | POA: Diagnosis not present

## 2018-06-03 DIAGNOSIS — M50223 Other cervical disc displacement at C6-C7 level: Secondary | ICD-10-CM | POA: Diagnosis not present

## 2018-06-03 DIAGNOSIS — M4722 Other spondylosis with radiculopathy, cervical region: Secondary | ICD-10-CM | POA: Diagnosis not present

## 2018-06-03 DIAGNOSIS — M9901 Segmental and somatic dysfunction of cervical region: Secondary | ICD-10-CM | POA: Diagnosis not present

## 2018-06-03 DIAGNOSIS — M50323 Other cervical disc degeneration at C6-C7 level: Secondary | ICD-10-CM | POA: Diagnosis not present

## 2018-06-03 NOTE — Telephone Encounter (Signed)
Patient is calling to advise she had her flu shot on 1017-19 at [facility].

## 2018-06-03 NOTE — Telephone Encounter (Signed)
Added to chart

## 2018-06-04 DIAGNOSIS — H401134 Primary open-angle glaucoma, bilateral, indeterminate stage: Secondary | ICD-10-CM | POA: Diagnosis not present

## 2018-06-10 DIAGNOSIS — M4722 Other spondylosis with radiculopathy, cervical region: Secondary | ICD-10-CM | POA: Diagnosis not present

## 2018-06-10 DIAGNOSIS — M9901 Segmental and somatic dysfunction of cervical region: Secondary | ICD-10-CM | POA: Diagnosis not present

## 2018-06-10 DIAGNOSIS — M50223 Other cervical disc displacement at C6-C7 level: Secondary | ICD-10-CM | POA: Diagnosis not present

## 2018-06-10 DIAGNOSIS — M791 Myalgia, unspecified site: Secondary | ICD-10-CM | POA: Diagnosis not present

## 2018-06-10 DIAGNOSIS — M9903 Segmental and somatic dysfunction of lumbar region: Secondary | ICD-10-CM | POA: Diagnosis not present

## 2018-06-10 DIAGNOSIS — M5136 Other intervertebral disc degeneration, lumbar region: Secondary | ICD-10-CM | POA: Diagnosis not present

## 2018-06-10 DIAGNOSIS — M6240 Contracture of muscle, unspecified site: Secondary | ICD-10-CM | POA: Diagnosis not present

## 2018-06-10 DIAGNOSIS — M50323 Other cervical disc degeneration at C6-C7 level: Secondary | ICD-10-CM | POA: Diagnosis not present

## 2018-06-19 DIAGNOSIS — M791 Myalgia, unspecified site: Secondary | ICD-10-CM | POA: Diagnosis not present

## 2018-06-19 DIAGNOSIS — M50323 Other cervical disc degeneration at C6-C7 level: Secondary | ICD-10-CM | POA: Diagnosis not present

## 2018-06-19 DIAGNOSIS — M4722 Other spondylosis with radiculopathy, cervical region: Secondary | ICD-10-CM | POA: Diagnosis not present

## 2018-06-19 DIAGNOSIS — M9903 Segmental and somatic dysfunction of lumbar region: Secondary | ICD-10-CM | POA: Diagnosis not present

## 2018-06-19 DIAGNOSIS — M9901 Segmental and somatic dysfunction of cervical region: Secondary | ICD-10-CM | POA: Diagnosis not present

## 2018-06-19 DIAGNOSIS — M6240 Contracture of muscle, unspecified site: Secondary | ICD-10-CM | POA: Diagnosis not present

## 2018-06-19 DIAGNOSIS — M5136 Other intervertebral disc degeneration, lumbar region: Secondary | ICD-10-CM | POA: Diagnosis not present

## 2018-06-19 DIAGNOSIS — M50223 Other cervical disc displacement at C6-C7 level: Secondary | ICD-10-CM | POA: Diagnosis not present

## 2018-08-25 ENCOUNTER — Ambulatory Visit (INDEPENDENT_AMBULATORY_CARE_PROVIDER_SITE_OTHER): Payer: PPO | Admitting: Internal Medicine

## 2018-08-25 ENCOUNTER — Other Ambulatory Visit (INDEPENDENT_AMBULATORY_CARE_PROVIDER_SITE_OTHER): Payer: PPO

## 2018-08-25 ENCOUNTER — Encounter: Payer: Self-pay | Admitting: Internal Medicine

## 2018-08-25 VITALS — BP 138/70 | HR 70 | Temp 98.7°F | Ht 64.0 in | Wt 144.0 lb

## 2018-08-25 DIAGNOSIS — F320 Major depressive disorder, single episode, mild: Secondary | ICD-10-CM | POA: Diagnosis not present

## 2018-08-25 DIAGNOSIS — D509 Iron deficiency anemia, unspecified: Secondary | ICD-10-CM

## 2018-08-25 DIAGNOSIS — K219 Gastro-esophageal reflux disease without esophagitis: Secondary | ICD-10-CM

## 2018-08-25 DIAGNOSIS — Z0000 Encounter for general adult medical examination without abnormal findings: Secondary | ICD-10-CM

## 2018-08-25 LAB — COMPREHENSIVE METABOLIC PANEL
ALBUMIN: 4.6 g/dL (ref 3.5–5.2)
ALT: 13 U/L (ref 0–35)
AST: 16 U/L (ref 0–37)
Alkaline Phosphatase: 59 U/L (ref 39–117)
BILIRUBIN TOTAL: 0.4 mg/dL (ref 0.2–1.2)
BUN: 17 mg/dL (ref 6–23)
CALCIUM: 9.6 mg/dL (ref 8.4–10.5)
CHLORIDE: 103 meq/L (ref 96–112)
CO2: 27 meq/L (ref 19–32)
Creatinine, Ser: 0.75 mg/dL (ref 0.40–1.20)
GFR: 79.43 mL/min (ref >60.00)
Glucose, Bld: 89 mg/dL (ref 70–99)
Potassium: 3.9 mEq/L (ref 3.5–5.1)
Sodium: 139 mEq/L (ref 135–145)
Total Protein: 7.1 g/dL (ref 6.0–8.3)

## 2018-08-25 LAB — CBC
HCT: 38.6 % (ref 36.0–46.0)
HEMOGLOBIN: 12.8 g/dL (ref 12.0–15.0)
MCHC: 33.1 g/dL (ref 30.0–36.0)
MCV: 92.2 fl (ref 78.0–100.0)
PLATELETS: 213 10*3/uL (ref 150.0–400.0)
RBC: 4.19 Mil/uL (ref 3.87–5.11)
RDW: 14 % (ref 11.5–15.5)
WBC: 7.4 10*3/uL (ref 4.0–10.5)

## 2018-08-25 LAB — LIPID PANEL
CHOL/HDL RATIO: 3
CHOLESTEROL: 214 mg/dL — AB (ref 0–200)
HDL: 65.4 mg/dL (ref >39.00)
LDL Cholesterol: 119 mg/dL — ABNORMAL HIGH (ref 0–99)
NonHDL: 148.19
TRIGLYCERIDES: 144 mg/dL (ref 0.0–149.0)
VLDL: 28.8 mg/dL (ref 0.0–40.0)

## 2018-08-25 MED ORDER — ZOSTER VAC RECOMB ADJUVANTED 50 MCG/0.5ML IM SUSR: 0.5000 mL | Freq: Once | INTRAMUSCULAR | 1 refills | Status: AC

## 2018-08-25 MED ORDER — BUSPIRONE HCL 5 MG PO TABS: 5.0000 mg | ORAL_TABLET | Freq: Two times a day (BID) | ORAL | 3 refills | Status: DC | PRN

## 2018-08-25 NOTE — Assessment & Plan Note (Signed)
Flu shot up to date. Pneumonia complete. Shingrix given rx. Tetanus up to date. Colonoscopy up to date. Mammogram up to date, pap smear aged out and dexa up to date. Counseled about sun safety and mole surveillance. Counseled about the dangers of distracted driving. Given 10 year screening recommendations.

## 2018-08-25 NOTE — Assessment & Plan Note (Signed)
Taking zoloft 100 mg daily and buspar as needed.

## 2018-08-25 NOTE — Assessment & Plan Note (Signed)
Checking CBC and not taking iron currently.

## 2018-08-25 NOTE — Assessment & Plan Note (Signed)
Omeprazole 20 mg daily and controlled.

## 2018-08-25 NOTE — Progress Notes (Signed)
   Subjective:   Patient ID: Linda [name], female    DOB: 02-06-1941, 78 y.o.   MRN: [id number]  HPI The patient is a 78 YO female coming in for physical.   PMH, [facility], social history reviewed and updated.   Review of Systems  Constitutional: Negative.   HENT: Negative.   Eyes: Negative.   Respiratory: Negative for cough, chest tightness and shortness of breath.   Cardiovascular: Negative for chest pain, palpitations and leg swelling.  Gastrointestinal: Negative for abdominal distention, abdominal pain, constipation, diarrhea, nausea and vomiting.  Musculoskeletal: Negative.   Skin: Negative.   Neurological: Negative.   Psychiatric/Behavioral: Negative.     Objective:  Physical Exam Constitutional:      Appearance: She is well-developed.  HENT:     Head: Normocephalic and atraumatic.  Neck:     Musculoskeletal: Normal range of motion.  Cardiovascular:     Rate and Rhythm: Normal rate and regular rhythm.  Pulmonary:     Effort: Pulmonary effort is normal. No respiratory distress.     Breath sounds: Normal breath sounds. No wheezing or rales.  Abdominal:     General: Bowel sounds are normal. There is no distension.     Palpations: Abdomen is soft.     Tenderness: There is no abdominal tenderness. There is no rebound.  Skin:    General: Skin is warm and dry.  Neurological:     Mental Status: She is alert and oriented to person, place, and time.     Coordination: Coordination normal.     Vitals:   08/25/18 1428  BP: 138/70  Pulse: 70  Temp: 98.7 F (37.1 C)  TempSrc: Oral  SpO2: 99%  Weight: 144 lb (65.3 kg)  Height: 5\' 4"  (1.626 m)    Assessment & Plan:

## 2018-08-25 NOTE — Patient Instructions (Signed)
It is okay to take the buspirone twice a day regular to see if this helps.   Health Maintenance, Female Adopting a healthy lifestyle and getting preventive care can go a long way to promote health and wellness. Talk with your health care provider about what schedule of regular examinations is right for you. This is a good chance for you to check in with your provider about disease prevention and staying healthy. In between checkups, there are plenty of things you can do on your own. Experts have done a lot of research about which lifestyle changes and preventive measures are most likely to keep you healthy. Ask your health care provider for more information. Weight and diet Eat a healthy diet  Be sure to include plenty of vegetables, fruits, low-fat dairy products, and lean protein.  Do not eat a lot of foods high in solid fats, added sugars, or salt.  Get regular exercise. This is one of the most important things you can do for your health. ? Most adults should exercise for at least 150 minutes each week. The exercise should increase your heart rate and make you sweat (moderate-intensity exercise). ? Most adults should also do strengthening exercises at least twice a week. This is in addition to the moderate-intensity exercise. Maintain a healthy weight  Body mass index (BMI) is a measurement that can be used to identify possible weight problems. It estimates body fat based on height and weight. Your health care provider can help determine your BMI and help you achieve or maintain a healthy weight.  For females 78 years of age and older: ? A BMI below 18.5 is considered underweight. ? A BMI of 18.5 to 24.9 is normal. ? A BMI of 25 to 29.9 is considered overweight. ? A BMI of 30 and above is considered obese. Watch levels of cholesterol and blood lipids  You should start having your blood tested for lipids and cholesterol at 78 years of age, then have this test every 5 years.  You may  need to have your cholesterol levels checked more often if: ? Your lipid or cholesterol levels are high. ? You are older than 78 years of age. ? You are at high risk for heart disease. Cancer screening Lung Cancer  Lung cancer screening is recommended for adults 23-13 years old who are at high risk for lung cancer because of a history of smoking.  A yearly low-dose CT scan of the lungs is recommended for people who: ? Currently smoke. ? Have quit within the past 15 years. ? Have at least a 30-pack-year history of smoking. A pack year is smoking an average of one pack of cigarettes a day for 1 year.  Yearly screening should continue until it has been 15 years since you quit.  Yearly screening should stop if you develop a health problem that would prevent you from having lung cancer treatment. Breast Cancer  Practice breast self-awareness. This means understanding how your breasts normally appear and feel.  It also means doing regular breast self-exams. Let your health care provider know about any changes, no matter how small.  If you are in your 20s or 30s, you should have a clinical breast exam (CBE) by a health care provider every 1-3 years as part of a regular health exam.  If you are 71 or older, have a CBE every year. Also consider having a breast X-ray (mammogram) every year.  If you have a family history of breast cancer, talk to  your health care provider about genetic screening.  If you are at high risk for breast cancer, talk to your health care provider about having an MRI and a mammogram every year.  Breast cancer gene (BRCA) assessment is recommended for women who have family members with BRCA-related cancers. BRCA-related cancers include: ? Breast. ? Ovarian. ? Tubal. ? Peritoneal cancers.  Results of the assessment will determine the need for genetic counseling and BRCA1 and BRCA2 testing. Cervical Cancer Your health care provider may recommend that you be screened  regularly for cancer of the pelvic organs (ovaries, uterus, and vagina). This screening involves a pelvic examination, including checking for microscopic changes to the surface of your cervix (Pap test). You may be encouraged to have this screening done every 3 years, beginning at age 21.  For women ages 30-65, health care providers may recommend pelvic exams and Pap testing every 3 years, or they may recommend the Pap and pelvic exam, combined with testing for human papilloma virus (HPV), every 5 years. Some types of HPV increase your risk of cervical cancer. Testing for HPV may also be done on women of any age with unclear Pap test results.  Other health care providers may not recommend any screening for nonpregnant women who are considered low risk for pelvic cancer and who do not have symptoms. Ask your health care provider if a screening pelvic exam is right for you.  If you have had past treatment for cervical cancer or a condition that could lead to cancer, you need Pap tests and screening for cancer for at least 20 years after your treatment. If Pap tests have been discontinued, your risk factors (such as having a new sexual partner) need to be reassessed to determine if screening should resume. Some women have medical problems that increase the chance of getting cervical cancer. In these cases, your health care provider may recommend more frequent screening and Pap tests. Colorectal Cancer  This type of cancer can be detected and often prevented.  Routine colorectal cancer screening usually begins at 78 years of age and continues through 78 years of age.  Your health care provider may recommend screening at an earlier age if you have risk factors for colon cancer.  Your health care provider may also recommend using home test kits to check for hidden blood in the stool.  A small camera at the end of a tube can be used to examine your colon directly (sigmoidoscopy or colonoscopy). This is  done to check for the earliest forms of colorectal cancer.  Routine screening usually begins at age 50.  Direct examination of the colon should be repeated every 5-10 years through 78 years of age. However, you may need to be screened more often if early forms of precancerous polyps or small growths are found. Skin Cancer  Check your skin from head to toe regularly.  Tell your health care provider about any new moles or changes in moles, especially if there is a change in a mole's shape or color.  Also tell your health care provider if you have a mole that is larger than the size of a pencil eraser.  Always use sunscreen. Apply sunscreen liberally and repeatedly throughout the day.  Protect yourself by wearing long sleeves, pants, a wide-brimmed hat, and sunglasses whenever you are outside. Heart disease, diabetes, and high blood pressure  High blood pressure causes heart disease and increases the risk of stroke. High blood pressure is more likely to develop in: ?   People who have blood pressure in the high end of the normal range (130-139/85-89 mm Hg). ? People who are overweight or obese. ? People who are African American.  If you are 18-39 years of age, have your blood pressure checked every 3-5 years. If you are 40 years of age or older, have your blood pressure checked every year. You should have your blood pressure measured twice-once when you are at a hospital or clinic, and once when you are not at a hospital or clinic. Record the average of the two measurements. To check your blood pressure when you are not at a hospital or clinic, you can use: ? An automated blood pressure machine at a pharmacy. ? A home blood pressure monitor.  If you are between 55 years and 79 years old, ask your health care provider if you should take aspirin to prevent strokes.  Have regular diabetes screenings. This involves taking a blood sample to check your fasting blood sugar level. ? If you are at a  normal weight and have a low risk for diabetes, have this test once every three years after 78 years of age. ? If you are overweight and have a high risk for diabetes, consider being tested at a younger age or more often. Preventing infection Hepatitis B  If you have a higher risk for hepatitis B, you should be screened for this virus. You are considered at high risk for hepatitis B if: ? You were born in a country where hepatitis B is common. Ask your health care provider which countries are considered high risk. ? Your parents were born in a high-risk country, and you have not been immunized against hepatitis B (hepatitis B vaccine). ? You have HIV or AIDS. ? You use needles to inject street drugs. ? You live with someone who has hepatitis B. ? You have had sex with someone who has hepatitis B. ? You get hemodialysis treatment. ? You take certain medicines for conditions, including cancer, organ transplantation, and autoimmune conditions. Hepatitis C  Blood testing is recommended for: ? Everyone born from 1945 through 1965. ? Anyone with known risk factors for hepatitis C. Sexually transmitted infections (STIs)  You should be screened for sexually transmitted infections (STIs) including gonorrhea and chlamydia if: ? You are sexually active and are younger than 78 years of age. ? You are older than 78 years of age and your health care provider tells you that you are at risk for this type of infection. ? Your sexual activity has changed since you were last screened and you are at an increased risk for chlamydia or gonorrhea. Ask your health care provider if you are at risk.  If you do not have HIV, but are at risk, it may be recommended that you take a prescription medicine daily to prevent HIV infection. This is called pre-exposure prophylaxis (PrEP). You are considered at risk if: ? You are sexually active and do not regularly use condoms or know the HIV status of your partner(s). ? You  take drugs by injection. ? You are sexually active with a partner who has HIV. Talk with your health care provider about whether you are at high risk of being infected with HIV. If you choose to begin PrEP, you should first be tested for HIV. You should then be tested every 3 months for as long as you are taking PrEP. Pregnancy  If you are premenopausal and you may become pregnant, ask your health care provider about   preconception counseling.  If you may become pregnant, take 400 to 800 micrograms (mcg) of folic acid every day.  If you want to prevent pregnancy, talk to your health care provider about birth control (contraception). Osteoporosis and menopause  Osteoporosis is a disease in which the bones lose minerals and strength with aging. This can result in serious bone fractures. Your risk for osteoporosis can be identified using a bone density scan.  If you are 65 years of age or older, or if you are at risk for osteoporosis and fractures, ask your health care provider if you should be screened.  Ask your health care provider whether you should take a calcium or vitamin D supplement to lower your risk for osteoporosis.  Menopause may have certain physical symptoms and risks.  Hormone replacement therapy may reduce some of these symptoms and risks. Talk to your health care provider about whether hormone replacement therapy is right for you. Follow these instructions at home:  Schedule regular health, dental, and eye exams.  Stay current with your immunizations.  Do not use any tobacco products including cigarettes, chewing tobacco, or electronic cigarettes.  If you are pregnant, do not drink alcohol.  If you are breastfeeding, limit how much and how often you drink alcohol.  Limit alcohol intake to no more than 1 drink per day for nonpregnant women. One drink equals 12 ounces of beer, 5 ounces of wine, or 1 ounces of hard liquor.  Do not use street drugs.  Do not share  needles.  Ask your health care provider for help if you need support or information about quitting drugs.  Tell your health care provider if you often feel depressed.  Tell your health care provider if you have ever been abused or do not feel safe at home. This information is not intended to replace advice given to you by your health care provider. Make sure you discuss any questions you have with your health care provider. Document Released: 02/11/2011 Document Revised: 01/04/2016 Document Reviewed: 05/02/2015 Elsevier Interactive Patient Education  2019 [employer]  

## 2018-09-10 ENCOUNTER — Other Ambulatory Visit: Payer: Self-pay | Admitting: Internal Medicine

## 2018-09-10 MED ORDER — SERTRALINE HCL 100 MG PO TABS: 100.0000 mg | ORAL_TABLET | Freq: Every day | ORAL | 3 refills | Status: DC

## 2018-09-10 MED ORDER — OMEPRAZOLE 20 MG PO CPDR: 20.0000 mg | DELAYED_RELEASE_CAPSULE | Freq: Every day | ORAL | 3 refills | Status: DC

## 2018-09-10 NOTE — Telephone Encounter (Signed)
Copied from [place] [id number]. Topic: Quick Communication - Rx Refill/Question >> Sep 10, 2018 10:15 AM Yvette Rack wrote: Medication: omeprazole (PRILOSEC) 20 MG capsule and sertraline (ZOLOFT) 100 MG tablet  Has the patient contacted their pharmacy? No - Pt has changed insurance and pharmacy would need Rx   Preferred Pharmacy (with phone number or street name): [facility], [place]. [phone] (Phone)  [phone] (Fax)  Agent: Please be advised that RX refills may take up to 3 business days. We ask that you follow-up with your pharmacy.

## 2018-10-21 DIAGNOSIS — M79641 Pain in right hand: Secondary | ICD-10-CM | POA: Insufficient documentation

## 2018-10-21 DIAGNOSIS — G5603 Carpal tunnel syndrome, bilateral upper limbs: Secondary | ICD-10-CM | POA: Diagnosis not present

## 2018-10-21 DIAGNOSIS — M79642 Pain in left hand: Secondary | ICD-10-CM | POA: Insufficient documentation

## 2018-11-09 ENCOUNTER — Telehealth: Payer: Self-pay | Admitting: *Deleted

## 2018-11-09 NOTE — Telephone Encounter (Signed)
Called patient to inform them the nurse needs to either convert their upcoming AWV to a virtual visit or reschedule the visit out into the future due to covid-19 safety measures. A virtual appointment was made on 11/26/18.

## 2018-11-25 NOTE — Progress Notes (Signed)
Subjective:   Linda [name] is a 78 y.o. female who presents for Medicare Annual (Subsequent) preventive examination.  I connected with patient 11/26/18 at  3:00 PM EDT by a video enabled telemedicine application and verified that I am speaking with the correct person using two identifiers. Patient stated full name and DOB. Patient gave permission to continue with virtual visit. Patient's location was at home and Nurse's location was at [place] office.   Review of Systems:  No ROS.  Medicare Wellness Visit. Additional risk factors are reflected in the social history.  Cardiac Risk Factors include: advanced age (>104men, >28 women) Sleep patterns: no sleep issues, feels rested on waking, gets up 1 times nightly to void and sleeps 7-8 hours nightly.    Home Safety/Smoke Alarms: Feels safe in home. Smoke alarms in place.  Living environment; residence and Firearm Safety: 1-story house/ trailer, equipment: Radio producer, Type: [place]. Lives alone, no needs for DME, good support system  Seat Belt Safety/Bike Helmet: Wears seat belt.      Objective:     Vitals: There were no vitals taken for this visit.  There is no height or weight on file to calculate BMI.  Advanced Directives 11/26/2018 08/22/2016 06/07/2015  Does Patient Have a Medical Advance Directive? Yes Yes No;Yes  Type of Paramedic of [place];Living will [place];Living will [place];Living will  Copy of [place] in Chart? No - copy requested No - copy requested -    Tobacco Social History   Tobacco Use  Smoking Status Former Smoker  . Last attempt to quit: 08/12/1988  . Years since quitting: 30.3  Smokeless Tobacco Never Used     Counseling given: Not Answered  Past Medical History:  Diagnosis Date  . ALLERGIC RHINITIS   . Allergy   . ANEMIA-IRON DEFICIENCY   . Anxiety state, unspecified   . Carpal tunnel syndrome    pt  denies   . COLONIC POLYPS, HX OF 2007  . GERD   . GLAUCOMA   . GOITER, MULTINODULAR    on [place] 2006, unchanged 6/13 with nodules all <85mm  . HIATAL HERNIA WITH REFLUX   . Hip dysplasia, acquired    B sx; eval at [facility] for same 01/2015 -ongoing PT  . OSTEOARTHRITIS   . TOBACCO USE, QUIT    Past Surgical History:  Procedure Laterality Date  . BREAST SURGERY  1960   Left breast cyst removed no complications  . COLONOSCOPY  2005  . EYE SURGERY  1991-1992   both eyes  . EYE SURGERY Left 2014   shunt behind left eye  . Right shoulder aurgery  10/2008   Family History  Problem Relation Age of Onset  . Diabetes Mother   . Heart disease Mother   . Arthritis Mother   . Hypertension Mother   . Arthritis Father   . Colon cancer Paternal Grandmother    Social History   Socioeconomic History  . Marital status: Widowed    Spouse name: Not on file  . Number of children: 2  . Years of education: Not on file  . Highest education level: Not on file  Occupational History  . Not on file  Social Needs  . Financial resource strain: Not hard at all  . Food insecurity:    Worry: Never true    Inability: Never true  . Transportation needs:    Medical: No    Non-medical: No  Tobacco Use  . Smoking status: Former Smoker    Last attempt to quit: 08/12/1988    Years since quitting: 30.3  . Smokeless tobacco: Never Used  Substance and Sexual Activity  . Alcohol use: Yes    Alcohol/week: 0.0 standard drinks    Comment: occassionally  . Drug use: No  . Sexual activity: Never  Lifestyle  . Physical activity:    Days per week: 3 days    Minutes per session: 70 min  . Stress: Only a little  Relationships  . Social connections:    Talks on phone: More than three times a week    Gets together: More than three times a week    Attends religious service: More than 4 times per year    Active member of club or organization: Yes    Attends meetings of clubs or organizations: More than 4 times  per year    Relationship status: Widowed  Other Topics Concern  . 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 acetaminophen (TYLENOL ARTHRITIS PAIN) 650 MG CR tablet Take 1,300 mg by mouth 2 (two) times daily.   Marland Kitchen b complex vitamins tablet Take 1 tablet by mouth daily.    . busPIRone (BUSPAR) 5 MG tablet Take 1 tablet (5 mg total) by mouth 2 (two) times daily as needed.  . gabapentin (NEURONTIN) 100 MG capsule Take 2 capsules (200 mg total) by mouth at bedtime. (Patient taking differently: 200 mg. Take two 300 mg tablets by mouth daily.)  . omeprazole (PRILOSEC) 20 MG capsule Take 1 capsule (20 mg total) by mouth daily.  . sertraline (ZOLOFT) 100 MG tablet Take 1 tablet (100 mg total) by mouth daily.  . [DISCONTINUED] dorzolamide-timolol (COSOPT) 22.3-6.8 MG/ML ophthalmic solution Place 1 drop into both eyes 2 (two) times daily.    No facility-administered encounter medications on file as of 11/26/2018.     Activities of Daily Living In your present state of health, do you have any difficulty performing the following activities: 11/26/2018  Hearing? N  Vision? N  Difficulty concentrating or making decisions? N  Walking or climbing stairs? N  Dressing or bathing? N  Doing errands, shopping? N  Preparing Food and eating ? N  Using the Toilet? N  In the past six months, have you accidently leaked urine? N  Do you have problems with loss of bowel control? N  Managing your Medications? N  Managing your Finances? N  Housekeeping or managing your Housekeeping? N  Some recent data might be hidden    Patient Care Team: Hoyt Koch, MD as PCP - General (Internal Medicine) Melida Quitter, MD as Consulting Physician Princess Bruins, MD (Obstetrics and Gynecology) Suella Broad, MD (Physical Medicine and Rehabilitation) Bond, Tracie Harrier, MD (Ophthalmology) Katy Apo, MD (Ophthalmology) Kristeen Miss, MD as Consulting Physician (Neurosurgery) Pyrtle, Lajuan Lines, MD as Consulting Physician (Gastroenterology) Ignatius Specking (Dentistry)    Assessment:   This is a routine wellness examination for [place]. Physical assessment deferred to PCP.   Exercise Activities and Dietary recommendations Current Exercise Habits: Structured exercise class;Home exercise routine, Type of exercise: walking(water aerobics), Time (Minutes): 60, Frequency (Times/Week): 3, Weekly Exercise (Minutes/Week): 180, Intensity: Mild, Exercise limited by: orthopedic condition(s)  Diet (meal preparation, eat out, water intake, caffeinated beverages, dairy products, fruits and vegetables): in general, a "healthy" diet  , well balanced   Reviewed  heart healthy diet. Encouraged patient to increase daily water and healthy fluid intake.  Goals    . Patient Stated     Continue to  Exercise,  eat healthier, go out with my friends be active socially through church and other activities.     . Weight (lb) < 140 lb (63.5 kg)     Would like to lose 11 pounds by staying active and making healthy food choices.        Fall Risk Fall Risk  11/26/2018 08/25/2018 11/20/2017 08/22/2016 04/11/2016  Falls in the past year? 0 1 No Yes Yes  Comment - - - - Emmi Telephone Survey: data to providers prior to load  Number falls in past yr: 0 0 - 1 2 or more  Comment - - - - Emmi Telephone Survey Actual Response = 2  Injury with Fall? - 0 - No No  Risk for fall due to : Impaired mobility - - - -  Follow up Falls prevention discussed - - Falls prevention discussed -    Depression Screen PHQ 2/9 Scores 11/26/2018 08/25/2018 11/20/2017 08/22/2016  PHQ - 2 Score 1 1 3  0  PHQ- 9 Score 1 3 5  -     Cognitive Function       Ad8 score reviewed for issues:  Issues making decisions: no  Less interest in hobbies / activities: no  Repeats questions, stories (family complaining): no  Trouble using ordinary  gadgets (microwave, computer, phone):no  Forgets the month or year: no  Mismanaging finances: no  Remembering appts: no  Daily problems with thinking and/or memory: yes Ad8 score is= 1  Immunization History  Administered Date(s) Administered  . Influenza Split 05/12/2012  . Influenza Whole 05/12/2009, 05/29/2010  . Influenza, High Dose Seasonal PF 06/12/2013, 06/05/2016  . Influenza-Unspecified 05/12/2014, 05/27/2015, 05/12/2017, 05/28/2018  . Pneumococcal Conjugate-13 08/10/2013  . Pneumococcal Polysaccharide-23 07/12/2011  . Td 01/13/2004  . Tdap 02/23/2014  . Zoster 01/27/2008, 05/12/2009   Screening Tests Health Maintenance  Topic Date Due  . INFLUENZA VACCINE  03/13/2019  . COLONOSCOPY  06/27/2020  . TETANUS/TDAP  02/24/2024  . DEXA SCAN  Completed  . PNA vac Low Risk Adult  Completed       Plan:     Reviewed health maintenance screenings with patient today and relevant education, vaccines, and/or referrals were provided.   Continue doing brain stimulating activities (puzzles, reading, adult coloring books, staying active) to keep memory [place].   Continue to eat heart healthy diet (full of fruits, vegetables, whole grains, lean protein, water--limit salt, fat, and sugar intake) and increase physical activity as tolerated.  I have personally reviewed and noted the following in the patient's chart:   . Medical and social history . Use of alcohol, tobacco or illicit drugs  . Current medications and supplements . Functional ability and status . Nutritional status . Physical activity . Advanced directives . List of other physicians . Vitals . Screenings to include cognitive, depression, and falls . Referrals and appointments  In addition, I have reviewed and discussed with patient certain preventive protocols, quality metrics, and best practice recommendations. A written personalized care plan for preventive services as well as general preventive health  recommendations were provided to patient.     Michiel Cowboy, RN  11/26/2018

## 2018-11-26 ENCOUNTER — Ambulatory Visit (INDEPENDENT_AMBULATORY_CARE_PROVIDER_SITE_OTHER): Payer: PPO | Admitting: *Deleted

## 2018-11-26 DIAGNOSIS — Z0000 Encounter for general adult medical examination without abnormal findings: Secondary | ICD-10-CM

## 2018-11-26 NOTE — Patient Instructions (Addendum)
If you cannot attend class in person, you can still exercise at home. Video taped versions of AHOY classes are shown on [employer] (GTN) at 8 am and 1 pm Mondays through Fridays. You can also purchase a copy of the AHOY DVD by calling [place] (GTN) [employer]. GTN is available on [employer] channel 13 with a digital cable box and on [employer] channel 31. GTN is also available on [employer], channel 99. To view GTN, go to channel 99, press OK, select [place], then select GTN to start the channel.  Continue doing brain stimulating activities (puzzles, reading, adult coloring books, staying active) to keep memory [place].   Continue to eat heart healthy diet (full of fruits, vegetables, whole grains, lean protein, water--limit salt, fat, and sugar intake) and increase physical activity as tolerated.   Linda [name] , Thank you for taking time to come for your Medicare Wellness Visit. I appreciate your ongoing commitment to your health goals. Please review the following plan we discussed and let me know if I can assist you in the future.   These are the goals we discussed: Goals    . Patient Stated     Continue to  Exercise,  eat healthier, go out with my friends be active socially through church and other activities.     . Patient Stated     Continue to exercise by doing water aerobics and stay socially active within my church and with my friends.    . Weight (lb) < 140 lb (63.5 kg)     Would like to lose 11 pounds by staying active and making healthy food choices.        This is a list of the screening recommended for you and due dates:  Health Maintenance  Topic Date Due  . Flu Shot  03/13/2019  . Colon Cancer Screening  06/27/2020  . Tetanus Vaccine  02/24/2024  . DEXA scan (bone density measurement)  Completed  . Pneumonia vaccines  Completed   Health Maintenance, Female Adopting a healthy lifestyle and getting preventive care  can go a long way to promote health and wellness. Talk with your health care provider about what schedule of regular examinations is right for you. This is a good chance for you to check in with your provider about disease prevention and staying healthy. In between checkups, there are plenty of things you can do on your own. Experts have done a lot of research about which lifestyle changes and preventive measures are most likely to keep you healthy. Ask your health care provider for more information. Weight and diet Eat a healthy diet  Be sure to include plenty of vegetables, fruits, low-fat dairy products, and lean protein.  Do not eat a lot of foods high in solid fats, added sugars, or salt.  Get regular exercise. This is one of the most important things you can do for your health. ? Most adults should exercise for at least 150 minutes each week. The exercise should increase your heart rate and make you sweat (moderate-intensity exercise). ? Most adults should also do strengthening exercises at least twice a week. This is in addition to the moderate-intensity exercise. Maintain a healthy weight  Body mass index (BMI) is a measurement that can be used to identify possible weight problems. It estimates body fat based on height and weight. Your health care provider can help determine your BMI and help you achieve or maintain a healthy weight.  For  females 75 years of age and older: ? A BMI below 18.5 is considered underweight. ? A BMI of 18.5 to 24.9 is normal. ? A BMI of 25 to 29.9 is considered overweight. ? A BMI of 30 and above is considered obese. Watch levels of cholesterol and blood lipids  You should start having your blood tested for lipids and cholesterol at 78 years of age, then have this test every 5 years.  You may need to have your cholesterol levels checked more often if: ? Your lipid or cholesterol levels are high. ? You are older than 78 years of age. ? You are at high  risk for heart disease. Cancer screening Lung Cancer  Lung cancer screening is recommended for adults 39-90 years old who are at high risk for lung cancer because of a history of smoking.  A yearly low-dose CT scan of the lungs is recommended for people who: ? Currently smoke. ? Have quit within the past 15 years. ? Have at least a 30-pack-year history of smoking. A pack year is smoking an average of one pack of cigarettes a day for 1 year.  Yearly screening should continue until it has been 15 years since you quit.  Yearly screening should stop if you develop a health problem that would prevent you from having lung cancer treatment. Breast Cancer  Practice breast self-awareness. This means understanding how your breasts normally appear and feel.  It also means doing regular breast self-exams. Let your health care provider know about any changes, no matter how small.  If you are in your 20s or 30s, you should have a clinical breast exam (CBE) by a health care provider every 1-3 years as part of a regular health exam.  If you are 9 or older, have a CBE every year. Also consider having a breast X-ray (mammogram) every year.  If you have a family history of breast cancer, talk to your health care provider about genetic screening.  If you are at high risk for breast cancer, talk to your health care provider about having an MRI and a mammogram every year.  Breast cancer gene (BRCA) assessment is recommended for women who have family members with BRCA-related cancers. BRCA-related cancers include: ? Breast. ? Ovarian. ? Tubal. ? Peritoneal cancers.  Results of the assessment will determine the need for genetic counseling and BRCA1 and BRCA2 testing. Cervical Cancer Your health care provider may recommend that you be screened regularly for cancer of the pelvic organs (ovaries, uterus, and vagina). This screening involves a pelvic examination, including checking for microscopic changes  to the surface of your cervix (Pap test). You may be encouraged to have this screening done every 3 years, beginning at age 32.  For women ages 1-65, health care providers may recommend pelvic exams and Pap testing every 3 years, or they may recommend the Pap and pelvic exam, combined with testing for human papilloma virus (HPV), every 5 years. Some types of HPV increase your risk of cervical cancer. Testing for HPV may also be done on women of any age with unclear Pap test results.  Other health care providers may not recommend any screening for nonpregnant women who are considered low risk for pelvic cancer and who do not have symptoms. Ask your health care provider if a screening pelvic exam is right for you.  If you have had past treatment for cervical cancer or a condition that could lead to cancer, you need Pap tests and screening for cancer  for at least 20 years after your treatment. If Pap tests have been discontinued, your risk factors (such as having a new sexual partner) need to be reassessed to determine if screening should resume. Some women have medical problems that increase the chance of getting cervical cancer. In these cases, your health care provider may recommend more frequent screening and Pap tests. Colorectal Cancer  This type of cancer can be detected and often prevented.  Routine colorectal cancer screening usually begins at 78 years of age and continues through 78 years of age.  Your health care provider may recommend screening at an earlier age if you have risk factors for colon cancer.  Your health care provider may also recommend using home test kits to check for hidden blood in the stool.  A small camera at the end of a tube can be used to examine your colon directly (sigmoidoscopy or colonoscopy). This is done to check for the earliest forms of colorectal cancer.  Routine screening usually begins at age 6.  Direct examination of the colon should be repeated every  5-10 years through 78 years of age. However, you may need to be screened more often if early forms of precancerous polyps or small growths are found. Skin Cancer  Check your skin from head to toe regularly.  Tell your health care provider about any new moles or changes in moles, especially if there is a change in a mole's shape or color.  Also tell your health care provider if you have a mole that is larger than the size of a pencil eraser.  Always use sunscreen. Apply sunscreen liberally and repeatedly throughout the day.  Protect yourself by wearing long sleeves, pants, a wide-brimmed hat, and sunglasses whenever you are outside. Heart disease, diabetes, and high blood pressure  High blood pressure causes heart disease and increases the risk of stroke. High blood pressure is more likely to develop in: ? People who have blood pressure in the high end of the normal range (130-139/85-89 mm Hg). ? People who are overweight or obese. ? People who are African American.  If you are 38-49 years of age, have your blood pressure checked every 3-5 years. If you are 42 years of age or older, have your blood pressure checked every year. You should have your blood pressure measured twice-once when you are at a hospital or clinic, and once when you are not at a hospital or clinic. Record the average of the two measurements. To check your blood pressure when you are not at a hospital or clinic, you can use: ? An automated blood pressure machine at a pharmacy. ? A home blood pressure monitor.  If you are between 14 years and 73 years old, ask your health care provider if you should take aspirin to prevent strokes.  Have regular diabetes screenings. This involves taking a blood sample to check your fasting blood sugar level. ? If you are at a normal weight and have a low risk for diabetes, have this test once every three years after 78 years of age. ? If you are overweight and have a high risk for  diabetes, consider being tested at a younger age or more often. Preventing infection Hepatitis B  If you have a higher risk for hepatitis B, you should be screened for this virus. You are considered at high risk for hepatitis B if: ? You were born in a country where hepatitis B is common. Ask your health care provider which countries  are considered high risk. ? Your parents were born in a high-risk country, and you have not been immunized against hepatitis B (hepatitis B vaccine). ? You have HIV or AIDS. ? You use needles to inject street drugs. ? You live with someone who has hepatitis B. ? You have had sex with someone who has hepatitis B. ? You get hemodialysis treatment. ? You take certain medicines for conditions, including cancer, organ transplantation, and autoimmune conditions. Hepatitis C  Blood testing is recommended for: ? Everyone born from [birth year] through 1965. ? Anyone with known risk factors for hepatitis C. Sexually transmitted infections (STIs)  You should be screened for sexually transmitted infections (STIs) including gonorrhea and chlamydia if: ? You are sexually active and are younger than 78 years of age. ? You are older than 78 years of age and your health care provider tells you that you are at risk for this type of infection. ? Your sexual activity has changed since you were last screened and you are at an increased risk for chlamydia or gonorrhea. Ask your health care provider if you are at risk.  If you do not have HIV, but are at risk, it may be recommended that you take a prescription medicine daily to prevent HIV infection. This is called pre-exposure prophylaxis (PrEP). You are considered at risk if: ? You are sexually active and do not regularly use condoms or know the HIV status of your partner(s). ? You take drugs by injection. ? You are sexually active with a partner who has HIV. Talk with your health care provider about whether you are at high risk of  being infected with HIV. If you choose to begin PrEP, you should first be tested for HIV. You should then be tested every 3 months for as long as you are taking PrEP. Pregnancy  If you are premenopausal and you may become pregnant, ask your health care provider about preconception counseling.  If you may become pregnant, take 400 to 800 micrograms (mcg) of folic acid every day.  If you want to prevent pregnancy, talk to your health care provider about birth control (contraception). Osteoporosis and menopause  Osteoporosis is a disease in which the bones lose minerals and strength with aging. This can result in serious bone fractures. Your risk for osteoporosis can be identified using a bone density scan.  If you are 67 years of age or older, or if you are at risk for osteoporosis and fractures, ask your health care provider if you should be screened.  Ask your health care provider whether you should take a calcium or vitamin D supplement to lower your risk for osteoporosis.  Menopause may have certain physical symptoms and risks.  Hormone replacement therapy may reduce some of these symptoms and risks. Talk to your health care provider about whether hormone replacement therapy is right for you. Follow these instructions at home:  Schedule regular health, dental, and eye exams.  Stay current with your immunizations.  Do not use any tobacco products including cigarettes, chewing tobacco, or electronic cigarettes.  If you are pregnant, do not drink alcohol.  If you are breastfeeding, limit how much and how often you drink alcohol.  Limit alcohol intake to no more than 1 drink per day for nonpregnant women. One drink equals 12 ounces of beer, 5 ounces of wine, or 1 ounces of hard liquor.  Do not use street drugs.  Do not share needles.  Ask your health care provider for help if  you need support or information about quitting drugs.  Tell your health care provider if you often feel  depressed.  Tell your health care provider if you have ever been abused or do not feel safe at home. This information is not intended to replace advice given to you by your health care provider. Make sure you discuss any questions you have with your health care provider. Document Released: 02/11/2011 Document Revised: 01/04/2016 Document Reviewed: 05/02/2015 Elsevier Interactive Patient Education  2019 [name].

## 2018-11-27 NOTE — Progress Notes (Signed)
Medical screening examination/treatment/procedure(s) were performed by non-physician practitioner and as supervising physician I was immediately available for consultation/collaboration. I agree with above. Presly A Crawford, MD 

## 2018-11-30 DIAGNOSIS — G5603 Carpal tunnel syndrome, bilateral upper limbs: Secondary | ICD-10-CM | POA: Diagnosis not present

## 2018-11-30 DIAGNOSIS — M79642 Pain in left hand: Secondary | ICD-10-CM | POA: Diagnosis not present

## 2018-11-30 DIAGNOSIS — G5601 Carpal tunnel syndrome, right upper limb: Secondary | ICD-10-CM | POA: Diagnosis not present

## 2018-11-30 DIAGNOSIS — M79641 Pain in right hand: Secondary | ICD-10-CM | POA: Diagnosis not present

## 2018-11-30 DIAGNOSIS — G5602 Carpal tunnel syndrome, left upper limb: Secondary | ICD-10-CM | POA: Diagnosis not present

## 2018-12-22 DIAGNOSIS — L940 Localized scleroderma [morphea]: Secondary | ICD-10-CM | POA: Diagnosis not present

## 2018-12-22 DIAGNOSIS — S20462A Insect bite (nonvenomous) of left back wall of thorax, initial encounter: Secondary | ICD-10-CM | POA: Diagnosis not present

## 2019-01-12 DIAGNOSIS — L940 Localized scleroderma [morphea]: Secondary | ICD-10-CM | POA: Diagnosis not present

## 2019-01-12 DIAGNOSIS — L900 Lichen sclerosus et atrophicus: Secondary | ICD-10-CM | POA: Diagnosis not present

## 2019-01-14 ENCOUNTER — Telehealth: Payer: Self-pay | Admitting: Internal Medicine

## 2019-01-14 DIAGNOSIS — M545 Low back pain, unspecified: Secondary | ICD-10-CM

## 2019-01-14 NOTE — Telephone Encounter (Signed)
Patient informed. 

## 2019-01-14 NOTE — Telephone Encounter (Signed)
Patient is wanting to get the referral for her bad back. States that she did not decide to do surgery on it in the past and now her back is going out. Talked to Dr. Ellene Route office but cannot get into their office till end of July. Dr. Ellene Route advised patient to call pcp and get PT to help while she waits for an appointment with them.

## 2019-01-14 NOTE — Telephone Encounter (Signed)
For what reason is PT needed?

## 2019-01-14 NOTE — Telephone Encounter (Signed)
Copied from [place] [id number]. Topic: General - Other >> Jan 14, 2019 10:00 AM Celene Kras A wrote: Reason for CRM: Pt called and is requesting to get a referral to PT at [facility] health outpatient rehab at [facility] [phone].

## 2019-01-14 NOTE — Telephone Encounter (Signed)
Referral placed.

## 2019-01-21 ENCOUNTER — Encounter: Payer: Self-pay | Admitting: Physical Therapy

## 2019-01-21 ENCOUNTER — Ambulatory Visit: Payer: PPO | Attending: Internal Medicine | Admitting: Physical Therapy

## 2019-01-21 ENCOUNTER — Other Ambulatory Visit: Payer: Self-pay

## 2019-01-21 DIAGNOSIS — M6281 Muscle weakness (generalized): Secondary | ICD-10-CM | POA: Insufficient documentation

## 2019-01-21 DIAGNOSIS — M545 Low back pain, unspecified: Secondary | ICD-10-CM

## 2019-01-21 DIAGNOSIS — G8929 Other chronic pain: Secondary | ICD-10-CM | POA: Insufficient documentation

## 2019-01-21 NOTE — Therapy (Signed)
[facility] [address], [place], [place], [address] Phone: [phone]   Fax:  [phone]  Physical Therapy Evaluation  Patient Details  Name: Linda [name] MRN: [id number] Date of Birth: February 18, 1941 Referring Provider (PT): Hoyt Koch   Encounter Date: 01/21/2019  PT End of Session - 01/21/19 1654    Visit Number  1    Date for PT Re-Evaluation  03/18/19    PT Start Time  4854    PT Stop Time  6270    PT Time Calculation (min)  42 min    Activity Tolerance  Patient tolerated treatment well       Past Medical History:  Diagnosis Date  . ALLERGIC RHINITIS   . Allergy   . ANEMIA-IRON DEFICIENCY   . Anxiety state, unspecified   . Carpal tunnel syndrome    pt denies   . COLONIC POLYPS, HX OF 2007  . GERD   . GLAUCOMA   . GOITER, MULTINODULAR    on [place] 2006, unchanged 6/13 with nodules all <89mm  . HIATAL HERNIA WITH REFLUX   . Hip dysplasia, acquired    B sx; eval at [facility] for same 01/2015 -ongoing PT  . OSTEOARTHRITIS   . TOBACCO USE, QUIT     Past Surgical History:  Procedure Laterality Date  . BREAST SURGERY  1960   Left breast cyst removed no complications  . COLONOSCOPY  2005  . EYE SURGERY  1991-1992   both eyes  . EYE SURGERY Left 2014   shunt behind left eye  . Right shoulder aurgery  10/2008    There were no vitals filed for this visit.   Subjective Assessment - 01/21/19 1607    Subjective  I have been doing more yard work and going up and down the stairs.  That along with no water aerobics I think has made my back hurt.  Sometimes it stops me when I try to put weight through that leg and my knee has given out.  Per chart, patient is going see to Dr. Ellene Route mid July for follow up.    Pertinent History  goes by "[name]", scoliosis and lumbar stenosis    Limitations  Walking;Standing;House hold activities    Patient Stated Goals  be able to walk and garden without pain    Currently in Pain?  Yes    Pain Score  4    7/10 sometimes with weight on it   Pain Location  Back    Pain Orientation  Left;Right   More on the right   Pain Descriptors / Indicators  Burning;[place]    Pain Type  Acute pain;Chronic pain   acute flare of chronic   Pain Radiating Towards  into right buttock    Pain Onset  1 to 4 weeks ago    Pain Frequency  Intermittent    Aggravating Factors   yard work and housework, walking    Pain Relieving Factors  biofreeze and heat    Effect of Pain on Daily Activities  sleeping is harder to get comfortable    Multiple Pain Sites  No         [facility] PT Assessment - 01/21/19 0001      Assessment   Medical Diagnosis  M54.5 (ICD-10-CM) - Low back pain, unspecified back pain laterality, unspecified chronicity, unspecified whether sciatica present    Referring Provider (PT)  Pricilla Holm A    Onset Date/Surgical Date  --  3 weeks ago   Prior Therapy  No      Precautions   Precautions  None      Restrictions   Weight Bearing Restrictions  No      Balance Screen   Has the patient fallen in the past 6 months  No      Home Environment   Living Environment  Private residence    Living Arrangements  --   husband died 1 year ago     Prior Function   Level of Independence  Independent      Cognition   Overall Cognitive Status  Within Functional Limits for tasks assessed      Observation/Other Assessments   Focus on Therapeutic Outcomes (FOTO)   56% limited      Posture/Postural Control   Posture/Postural Control  Postural limitations    Postural Limitations  Increased thoracic kyphosis;Decreased lumbar lordosis;Right pelvic obliquity   scoliosis     ROM / Strength   AROM / PROM / Strength  AROM;PROM;Strength      AROM   Overall AROM Comments  lumbar extension 50% limited; lumbar flexion 25% limited most movement from hamstring flexibility      PROM   Overall PROM Comments  hip IR 75% limited      Strength   Strength  Assessment Site  Hip    Right/Left Hip  Right;Left    Right Hip Flexion  4-/5    Right Hip External Rotation   4+/5    Right Hip ABduction  4+/5    Right Hip ADduction  4+/5    Left Hip Flexion  4/5    Left Hip External Rotation  5/5    Left Hip ABduction  5/5    Left Hip ADduction  4+/5      Flexibility   Soft Tissue Assessment /Muscle Length  yes    Hamstrings  WNL    Piriformis  WNL      Palpation   SI assessment   compression relieves pain    Palpation comment  tightness and tender to palpation, lumbar paraspinals, Rt SI joint, right gluteals, decreased SI mobiity during lumbar flexion      Special Tests    Special Tests  Lumbar    Lumbar Tests  Straight Leg Raise      Straight Leg Raise   Findings  Negative      Ambulation/Gait   Gait Pattern  Trendelenburg;Decreased stance time - right;Decreased step length - left                Objective measurements completed on examination: See above findings.      [facility] Adult PT Treatment/Exercise - 01/21/19 0001      Self-Care   Self-Care  Other Self-Care Comments    Other Self-Care Comments   initial HEP - hip flexor stretch             PT Education - 01/21/19 1841    Education Details  Access Code: [email or web address]    Person(s) Educated  Patient    Methods  Explanation;Demonstration;Tactile cues;Verbal cues    Comprehension  Verbalized understanding;Returned demonstration       PT Short Term Goals - 01/21/19 1842      PT SHORT TERM GOAL #1   Title  pt will report 25% less pain with walking    Time  4    Period  Weeks    Status  New    Target Date  02/18/19  PT SHORT TERM GOAL #2   Title  Pt will be ind with initial HEP    Time  4    Period  Weeks    Status  New    Target Date  02/18/19        PT Long Term Goals - 01/21/19 1842      PT LONG TERM GOAL #1   Title  Pt will be ind with advanced HEP for maintaining strength to perform yard work safely.    Time  8    Period  Weeks    Status   New    Target Date  03/18/19      PT LONG TERM GOAL #2   Title  Pt will report 75% less pain with yard work and walking activities.    Time  8    Period  Weeks    Status  New    Target Date  03/18/19      PT LONG TERM GOAL #3   Title  Pt will be able to safely and correctly lift medium weight like bag from the floor.    Time  8    Period  Weeks    Status  New    Target Date  03/18/19      PT LONG TERM GOAL #4   Title  Pt will be able to roll in bed without pain due to improved core strength    Time  8    Period  Weeks    Status  New    Target Date  03/18/19      PT LONG TERM GOAL #5   Title  FOTO < or = to 37% for improved function    Time  8    Period  Weeks    Status  New    Target Date  03/18/19             Plan - 01/21/19 1653    Clinical Impression Statement  Pt presents to clinic with low back pain that began 3 weeks ago.  She has chronic back issues but recent flare up.  Pt has scoliosis S-curvature of the spine.  She has limited lumbar mobilty with 50% decreased flexion and extension.  When doing forward lumbar flexion, she has decreased SI movement on Rt side. Pt has Rt hip weakness of hip flexion and tight hip flexor on Rt side.  Rt anterior hip rotation.  Pt has decreased pain with pelvic compression. She reponds favorably to pelvic compression. Pt has good hamstring length and hip ER, but hip IR limited about 75% bilaterally.  Pt has posture and gait abnormailities as mentioned above.  She will benefit from skilled PT to address impairment s and return to maximum functional activities such as walking and yard work.    Personal Factors and Comorbidities  Age;Comorbidity 2    Comorbidities  chronic, stenosis, scoliosis    Examination-Activity Limitations  Squat;Stand;Lift    Examination-Participation Restrictions  Yard Work    Stability/Clinical Decision Making  Stable/Uncomplicated    Clinical Decision Making  Low    Rehab Potential  Excellent    PT  Frequency  2x / week    PT Duration  8 weeks    PT Treatment/Interventions  ADLs/Self Care Home Management;Biofeedback;Cryotherapy;Electrical Stimulation;Moist Heat;Traction;Gait training;Therapeutic activities;Therapeutic exercise;Neuromuscular re-education;Patient/family education;Manual techniques;Taping;Dry needling;Passive range of motion    PT Next Visit Plan  gentle glute and core strength, nustep       Patient will benefit from  skilled therapeutic intervention in order to improve the following deficits and impairments:  Abnormal gait, Hypomobility, Decreased strength, Increased fascial restricitons, Pain, Increased muscle spasms, Difficulty walking, Decreased range of motion, Postural dysfunction  Visit Diagnosis: Chronic bilateral low back pain without sciatica   Muscle weakness (generalized)    Problem List Patient Active Problem List   Diagnosis Date Noted  . Routine general medical examination at a health care facility 07/22/2015  . Hip dysplasia, acquired   . Bladder prolapse, female, acquired 01/09/2012  . MDD (major depressive disorder), single episode, mild ([place]) 05/29/2010  . GLAUCOMA 12/14/2009  . Iron deficiency anemia 05/21/2007  . GERD (gastroesophageal reflux disease) 05/21/2007  . Osteoarthritis 05/21/2007    Jule Ser, PT 01/21/2019, 6:47 PM  [facility] [address] [place], [place], [address] Phone: [phone]   Fax:  [phone]  Name: Linda [name] MRN: [id number] Date of Birth: 1940-09-18

## 2019-01-21 NOTE — Patient Instructions (Signed)
Access Code: [email or web address]  URL: [email or web address]  Date: 01/21/2019  Prepared by: Jari Favre   Exercises  Hip Flexor Stretch on Step - 3 reps - 1 sets - 30 sec hold - 1x daily - 7x weekly  Supine Hip Internal and External Rotation - 5 reps - 1 sets - 10 sec hold - 1x daily - 7x weekly

## 2019-01-25 ENCOUNTER — Other Ambulatory Visit: Payer: Self-pay

## 2019-01-25 ENCOUNTER — Ambulatory Visit: Payer: PPO | Admitting: Physical Therapy

## 2019-01-25 ENCOUNTER — Encounter: Payer: Self-pay | Admitting: Physical Therapy

## 2019-01-25 DIAGNOSIS — M6281 Muscle weakness (generalized): Secondary | ICD-10-CM

## 2019-01-25 DIAGNOSIS — M545 Low back pain: Secondary | ICD-10-CM | POA: Diagnosis not present

## 2019-01-25 DIAGNOSIS — G8929 Other chronic pain: Secondary | ICD-10-CM

## 2019-01-25 NOTE — Therapy (Signed)
[facility] [address] [place], [place], [address] Phone: [phone]   Fax:  [phone]  Physical Therapy Treatment  Patient Details  Name: Linda [name] MRN: [id number] Date of Birth: 1941-01-14 Referring Provider (PT): Linda [name]   Encounter Date: 01/25/2019  PT End of Session - 01/25/19 1501    Visit Number  2    Date for PT Re-Evaluation  03/18/19    PT Start Time  1502    Activity Tolerance  Patient tolerated treatment well    Behavior During Therapy  [facility] for tasks assessed/performed       Past Medical History:  Diagnosis Date  . ALLERGIC RHINITIS   . Allergy   . ANEMIA-IRON DEFICIENCY   . Anxiety state, unspecified   . Carpal tunnel syndrome    pt denies   . COLONIC POLYPS, HX OF 2007  . GERD   . GLAUCOMA   . GOITER, MULTINODULAR    on [place] 2006, unchanged 6/13 with nodules all <70mm  . HIATAL HERNIA WITH REFLUX   . Hip dysplasia, acquired    B sx; eval at [facility] for same 01/2015 -ongoing PT  . OSTEOARTHRITIS   . TOBACCO USE, QUIT     Past Surgical History:  Procedure Laterality Date  . BREAST SURGERY  1960   Left breast cyst removed no complications  . COLONOSCOPY  2005  . EYE SURGERY  1991-1992   both eyes  . EYE SURGERY Left 2014   shunt behind left eye  . Right shoulder aurgery  10/2008    There were no vitals filed for this visit.  Subjective Assessment - 01/25/19 1505    Subjective  Felt good after eval, did some HEP, not every day. Today I have back pain ( mid ).    Pertinent History  goes by "Linda [name]", scoliosis and lumbar stenosis    Limitations  Walking;Standing;House hold activities    Patient Stated Goals  be able to walk and garden without pain    Currently in Pain?  Yes    Pain Score  5     Pain Location  Back    Pain Orientation  Lower    Pain Descriptors / Indicators  --   it just hurts   Multiple Pain Sites  No                        [facility] Adult PT Treatment/Exercise - 01/25/19 0001      Lumbar Exercises: Stretches   Active Hamstring Stretch  Right;Left;2 reps;20 seconds    Single Knee to Chest Stretch  Right;Left;3 reps;20 seconds   Vc to breathe   Lower Trunk Rotation  3 reps;10 seconds    Pelvic Tilt  10 reps;5 seconds      Lumbar Exercises: Aerobic   Nustep  L1 x 6 min   PTA present to discuss status     Lumbar Exercises: Supine   Pelvic Tilt  10 reps;5 seconds    Bridge  10 reps;3 seconds               PT Short Term Goals - 01/21/19 1842      PT SHORT TERM GOAL #1   Title  pt will report 25% less pain with walking    Time  4    Period  Weeks    Status  New    Target Date  02/18/19      PT  SHORT TERM GOAL #2   Title  Pt will be ind with initial HEP    Time  4    Period  Weeks    Status  New    Target Date  02/18/19        PT Long Term Goals - 01/21/19 1842      PT LONG TERM GOAL #1   Title  Pt will be ind with advanced HEP for maintaining strength to perform yard work safely.    Time  8    Period  Weeks    Status  New    Target Date  03/18/19      PT LONG TERM GOAL #2   Title  Pt will report 75% less pain with yard work and walking activities.    Time  8    Period  Weeks    Status  New    Target Date  03/18/19      PT LONG TERM GOAL #3   Title  Pt will be able to safely and correctly lift medium weight like bag from the floor.    Time  8    Period  Weeks    Status  New    Target Date  03/18/19      PT LONG TERM GOAL #4   Title  Pt will be able to roll in bed without pain due to improved core strength    Time  8    Period  Weeks    Status  New    Target Date  03/18/19      PT LONG TERM GOAL #5   Title  FOTO < or = to 37% for improved function    Time  8    Period  Weeks    Status  New    Target Date  03/18/19            Plan - 01/25/19 1516    Clinical Impression Statement  Pt presents today with moderate back pain.  Pt participated in mostly supine exercises for lumbopelvic ROM, hip and back stretching and basic strengthening along with core strength. Pt had difficulty contracting her gluteals isometrically.    Personal Factors and Comorbidities  Age;Comorbidity 2    Comorbidities  chronic, stenosis, scoliosis    Examination-Activity Limitations  Squat;Stand;Lift    Examination-Participation Restrictions  Yard Work    Stability/Clinical Decision Making  Stable/Uncomplicated    Rehab Potential  Excellent    PT Frequency  2x / week    PT Duration  8 weeks    PT Treatment/Interventions  ADLs/Self Care Home Management;Biofeedback;Cryotherapy;Electrical Stimulation;Moist Heat;Traction;Gait training;Therapeutic activities;Therapeutic exercise;Neuromuscular re-education;Patient/family education;Manual techniques;Taping;Dry needling;Passive range of motion       Patient will benefit from skilled therapeutic intervention in order to improve the following deficits and impairments:  Abnormal gait, Hypomobility, Decreased strength, Increased fascial restricitons, Pain, Increased muscle spasms, Difficulty walking, Decreased range of motion, Postural dysfunction  Visit Diagnosis: Chronic bilateral low back pain without sciatica  Muscle weakness (generalized)     Problem List Patient Active Problem List   Diagnosis Date Noted  . Routine general medical examination at a health care facility 07/22/2015  . Hip dysplasia, acquired   . Bladder prolapse, female, acquired 01/09/2012  . MDD (major depressive disorder), single episode, mild ([place]) 05/29/2010  . GLAUCOMA 12/14/2009  . Iron deficiency anemia 05/21/2007  . GERD (gastroesophageal reflux disease) 05/21/2007  . Osteoarthritis 05/21/2007    Linda [name], PTA 01/25/2019, 3:37 PM  [facility] [address], [place], [place], [address] Phone: [phone]   Fax:  [phone]  Name:  Linda [name] MRN: [id number] Date of Birth: 22-Dec-1940  Access Code: [email or web address]  URL: [email or web address]  Date: 01/25/2019  Prepared by: Linda [name]   Exercises  Hooklying Single Knee to Chest Stretch - 3 reps - 30 hold - 2x daily - 7x weekly  Supine Posterior Pelvic Tilt - 10 reps - 5 hold - 2x daily - 7x weekly  Hooklying Gluteal Sets - 10 reps - 1 sets - 5 hold - 2x daily - 7x weekly

## 2019-01-28 ENCOUNTER — Other Ambulatory Visit: Payer: Self-pay

## 2019-01-28 ENCOUNTER — Encounter: Payer: Self-pay | Admitting: Physical Therapy

## 2019-01-28 ENCOUNTER — Ambulatory Visit: Payer: PPO | Admitting: Physical Therapy

## 2019-01-28 DIAGNOSIS — G8929 Other chronic pain: Secondary | ICD-10-CM

## 2019-01-28 DIAGNOSIS — M545 Low back pain: Secondary | ICD-10-CM | POA: Diagnosis not present

## 2019-01-28 DIAGNOSIS — M6281 Muscle weakness (generalized): Secondary | ICD-10-CM

## 2019-01-28 NOTE — Therapy (Signed)
[facility] [address] [place], [place], [address] Phone: [phone]   Fax:  [phone]  Physical Therapy Treatment  Patient Details  Name: Linda [name] MRN: [id number] Date of Birth: November 17, 1940 Referring Provider (PT): Hoyt Koch   Encounter Date: 01/28/2019  PT End of Session - 01/28/19 1620    Visit Number  3    Date for PT Re-Evaluation  03/18/19    PT Start Time  5681    PT Stop Time  1615    PT Time Calculation (min)  45 min    Activity Tolerance  Patient tolerated treatment well    Behavior During Therapy  [facility] for tasks assessed/performed       Past Medical History:  Diagnosis Date  . ALLERGIC RHINITIS   . Allergy   . ANEMIA-IRON DEFICIENCY   . Anxiety state, unspecified   . Carpal tunnel syndrome    pt denies   . COLONIC POLYPS, HX OF 2007  . GERD   . GLAUCOMA   . GOITER, MULTINODULAR    on [place] 2006, unchanged 6/13 with nodules all <35mm  . HIATAL HERNIA WITH REFLUX   . Hip dysplasia, acquired    B sx; eval at [facility] for same 01/2015 -ongoing PT  . OSTEOARTHRITIS   . TOBACCO USE, QUIT     Past Surgical History:  Procedure Laterality Date  . BREAST SURGERY  1960   Left breast cyst removed no complications  . COLONOSCOPY  2005  . EYE SURGERY  1991-1992   both eyes  . EYE SURGERY Left 2014   shunt behind left eye  . Right shoulder aurgery  10/2008    There were no vitals filed for this visit.  Subjective Assessment - 01/28/19 1530    Subjective  I have been able to go to a pool and have done water exercise on my own for the last three days - I am very fatigued from this and my pain is up a bit, even woke me up last night so I didn't sleep well.  I can tell I've lost my fitness level from what it used to be.    Pertinent History  goes by "Linda [name]", scoliosis and lumbar stenosis    Limitations  Walking;Standing;House hold activities    Patient Stated Goals  be able to walk and  garden without pain    Currently in Pain?  Yes    Pain Score  7     Pain Location  Back    Pain Orientation  Right;Left;Lower    Pain Descriptors / Indicators  Dull    Pain Type  Chronic pain    Pain Onset  1 to 4 weeks ago    Pain Frequency  Intermittent                       [facility] Adult PT Treatment/Exercise - 01/28/19 0001      Self-Care   Self-Care  Other Self-Care Comments    Other Self-Care Comments   pacing on return to pool exercises as to not overdo, consider limiting time and spacing out for every other day, performance of a few reps of HEP in AM before rising to help with balance first thing in AM      Lumbar Exercises: Stretches   Active Hamstring Stretch  Right;Left;30 seconds    Active Hamstring Stretch Limitations  supine with strap    Single Knee to Chest Stretch  Right;Left;5 reps;10 seconds    Lower Trunk Rotation  10 seconds;5 reps    Pelvic Tilt  10 reps;5 seconds    Other Lumbar Stretch Exercise  hip adductor stretch bil 1x30 sec supine with strap      Lumbar Exercises: Aerobic   Nustep  L3 x 6'   PT present to discuss symptoms     Lumbar Exercises: Supine   Bridge with clamshell  10 reps    Bridge with [employer] Limitations  PT cued "hold piece of paper between your buttocks, press knees out into band to align with hips and feet, push through feet to bridge, return with control"             PT Education - 01/28/19 1616    Education Details  Access Code: [email or web address], pacing self at pool, priming muscles and ROM in AM before rising    Person(s) Educated  Patient    Methods  Explanation;Demonstration;Verbal cues;Handout    Comprehension  Verbalized understanding;Returned demonstration       PT Short Term Goals - 01/28/19 1624      PT SHORT TERM GOAL #2   Title  Pt will be ind with initial HEP    Status  On-going        PT Long Term Goals - 01/21/19 1842      PT LONG TERM GOAL #1   Title  Pt will be ind with advanced HEP  for maintaining strength to perform yard work safely.    Time  8    Period  Weeks    Status  New    Target Date  03/18/19      PT LONG TERM GOAL #2   Title  Pt will report 75% less pain with yard work and walking activities.    Time  8    Period  Weeks    Status  New    Target Date  03/18/19      PT LONG TERM GOAL #3   Title  Pt will be able to safely and correctly lift medium weight like bag from the floor.    Time  8    Period  Weeks    Status  New    Target Date  03/18/19      PT LONG TERM GOAL #4   Title  Pt will be able to roll in bed without pain due to improved core strength    Time  8    Period  Weeks    Status  New    Target Date  03/18/19      PT LONG TERM GOAL #5   Title  FOTO < or = to 37% for improved function    Time  8    Period  Weeks    Status  New    Target Date  03/18/19            Plan - 01/28/19 1620    Clinical Impression Statement  Much time spent today on self-care discussion surrounding pacing self with return to pool as to not overdo and explanation of benefit of "priming ROM and muscles" in bed and edge of bed before standing to prep for weight bearing and dynamic tasks with improved safety/stability.  PT also spent much time ramping gluteals in supine hooklying and found that addition of green clamshell band improved recruitment of glute max and eased effort of bridging.  Updated HEP to include hamstring and adductor stretch and bridge with clamshell.  Pt will continue to benefit from skilled PT along POC.    Comorbidities  chronic, stenosis, scoliosis    Rehab Potential  Excellent    PT Frequency  2x / week    PT Duration  8 weeks    PT Treatment/Interventions  ADLs/Self Care Home Management;Biofeedback;Cryotherapy;Electrical Stimulation;Moist Heat;Traction;Gait training;Therapeutic activities;Therapeutic exercise;Neuromuscular re-education;Patient/family education;Manual techniques;Taping;Dry needling;Passive range of motion    PT Next  Visit Plan  f/u on HEP, continue gluteal and core strength, add sit to stand and sidelying clamshell    PT Home Exercise Plan  Access Code: [email or web address]    Consulted and Agree with Plan of Care  Patient       Patient will benefit from skilled therapeutic intervention in order to improve the following deficits and impairments:     Visit Diagnosis: 1. Chronic bilateral low back pain without sciatica   2. Muscle weakness (generalized)        Problem List Patient Active Problem List   Diagnosis Date Noted  . Routine general medical examination at a health care facility 07/22/2015  . Hip dysplasia, acquired   . Bladder prolapse, female, acquired 01/09/2012  . MDD (major depressive disorder), single episode, mild ([place]) 05/29/2010  . GLAUCOMA 12/14/2009  . Iron deficiency anemia 05/21/2007  . GERD (gastroesophageal reflux disease) 05/21/2007  . Osteoarthritis 05/21/2007    Baruch Merl, PT 01/28/19 4:25 PM   [facility] [address] [place], [place], [address] Phone: [phone]   Fax:  [phone]  Name: Linda [name] MRN: [id number] Date of Birth: Jan 25, 1941

## 2019-01-28 NOTE — Patient Instructions (Signed)
Access Code: [email or web address]  URL: [email or web address]  Date: 01/28/2019  Prepared by: Venetia Night Mirra Basilio   Exercises  Hooklying Single Knee to Chest Stretch - 3 reps - 30 hold - 2x daily - 7x weekly  Supine Posterior Pelvic Tilt - 10 reps - 5 hold - 2x daily - 7x weekly  Hooklying Gluteal Sets - 10 reps - 1 sets - 5 hold - 2x daily - 7x weekly  Hip Flexor Stretch on Step - 3 reps - 3 sets - 30 hold - 1x daily - 7x weekly  Bridge with Hip Abduction and Resistance - 5 reps - 3 sets - 1x daily - 7x weekly  Supine Hamstring Stretch with Strap - 2 reps - 1 sets - 30 hold - 1x daily - 7x weekly  Hip Adductors and Hamstring Stretch with Strap - 2 reps - 1 sets - 30 hold - 1x daily - 7x weekly

## 2019-02-01 ENCOUNTER — Ambulatory Visit: Payer: PPO | Admitting: Physical Therapy

## 2019-02-01 ENCOUNTER — Other Ambulatory Visit: Payer: Self-pay

## 2019-02-01 ENCOUNTER — Encounter: Payer: Self-pay | Admitting: Physical Therapy

## 2019-02-01 DIAGNOSIS — M6281 Muscle weakness (generalized): Secondary | ICD-10-CM

## 2019-02-01 DIAGNOSIS — M545 Low back pain: Secondary | ICD-10-CM | POA: Diagnosis not present

## 2019-02-01 DIAGNOSIS — G8929 Other chronic pain: Secondary | ICD-10-CM

## 2019-02-01 NOTE — Therapy (Signed)
[facility] [address] [place], [place], [address] Phone: [phone]   Fax:  [phone]  Physical Therapy Treatment  Patient Details  Name: Linda [name] MRN: [id number] Date of Birth: [birth year] Referring Provider (PT): Hoyt Koch   Encounter Date: 02/01/2019  PT End of Session - 02/01/19 1539    Visit Number  4    Date for PT Re-Evaluation  03/18/19    PT Start Time  9242    PT Stop Time  1620    PT Time Calculation (min)  46 min    Activity Tolerance  Patient tolerated treatment well    Behavior During Therapy  [facility] for tasks assessed/performed       Past Medical History:  Diagnosis Date  . ALLERGIC RHINITIS   . Allergy   . ANEMIA-IRON DEFICIENCY   . Anxiety state, unspecified   . Carpal tunnel syndrome    pt denies   . COLONIC POLYPS, HX OF 2007  . GERD   . GLAUCOMA   . GOITER, MULTINODULAR    on [place] 2006, unchanged 6/13 with nodules all <78mm  . HIATAL HERNIA WITH REFLUX   . Hip dysplasia, acquired    B sx; eval at [facility] for same 01/2015 -ongoing PT  . OSTEOARTHRITIS   . TOBACCO USE, QUIT     Past Surgical History:  Procedure Laterality Date  . BREAST SURGERY  1960   Left breast cyst removed no complications  . COLONOSCOPY  2005  . EYE SURGERY  1991-1992   both eyes  . EYE SURGERY Left 2014   shunt behind left eye  . Right shoulder aurgery  10/2008    There were no vitals filed for this visit.  Subjective Assessment - 02/01/19 1537    Subjective  I do not have pain now.  The worst thing today was reaching for the tubes at the drive up teller.  I had to get out of the car and sit the other way.    Patient Stated Goals  be able to walk and garden without pain    Currently in Pain?  No/denies                       [facility] Adult PT Treatment/Exercise - 02/01/19 0001      Neuro Re-ed    Neuro Re-ed Details   standing at the wall  and doing pelvic tilt - needed  cues to point tailbone down to the floor and not hold breath - very small movement but she was able to get the correct movement and was instructed to add this to HEP      Lumbar Exercises: Stretches   Active Hamstring Stretch  Right;Left;30 seconds    Active Hamstring Stretch Limitations  supine with strap    Single Knee to Chest Stretch  Right;Left;5 reps;10 seconds    Lower Trunk Rotation  10 seconds;5 reps    Pelvic Tilt  10 reps;5 seconds      Lumbar Exercises: Aerobic   Nustep  L2 x 6'   PT present to discuss symptoms     Lumbar Exercises: Seated   Sit to Stand  20 reps    Sit to Stand Limitations  cues to exhale like "blowing out candles" when standing in order to acivate TrA muscles    Other Seated Lumbar Exercises  side bending to the left - 10 x      Lumbar Exercises: Supine  Bent Knee Raise  20 reps;2 seconds;Limitations    Bent Knee Raise Limitations  cues to maintain pelvic tilt    Bridge with clamshell  10 reps    Bridge with [employer] Limitations  yellow band - pt able to do with cue on the first rep then remembers the movement from last session    Other Supine Lumbar Exercises  bent knee fall out with pelvic -                PT Short Term Goals - 01/28/19 1624      PT SHORT TERM GOAL #2   Title  Pt will be ind with initial HEP    Status  On-going        PT Long Term Goals - 01/21/19 1842      PT LONG TERM GOAL #1   Title  Pt will be ind with advanced HEP for maintaining strength to perform yard work safely.    Time  8    Period  Weeks    Status  New    Target Date  03/18/19      PT LONG TERM GOAL #2   Title  Pt will report 75% less pain with yard work and walking activities.    Time  8    Period  Weeks    Status  New    Target Date  03/18/19      PT LONG TERM GOAL #3   Title  Pt will be able to safely and correctly lift medium weight like bag from the floor.    Time  8    Period  Weeks    Status  New    Target Date  03/18/19       PT LONG TERM GOAL #4   Title  Pt will be able to roll in bed without pain due to improved core strength    Time  8    Period  Weeks    Status  New    Target Date  03/18/19      PT LONG TERM GOAL #5   Title  FOTO < or = to 37% for improved function    Time  8    Period  Weeks    Status  New    Target Date  03/18/19            Plan - 02/01/19 1626    Clinical Impression Statement  Pt felt good with all of the stretches and exercises.  She was happy with her progress and not feeling any pain today.  Pt has difficulty coordinating pelvic tilt with breathing, but is able to understand and demonstrate with cues.  She will benefit from skilled PT to continue to address functional goals in POC    PT Treatment/Interventions  ADLs/Self Care Home Management;Biofeedback;Cryotherapy;Electrical Stimulation;Moist Heat;Traction;Gait training;Therapeutic activities;Therapeutic exercise;Neuromuscular re-education;Patient/family education;Manual techniques;Taping;Dry needling;Passive range of motion    PT Next Visit Plan  f/u on HEP, continue gluteal and core strength, add sit to stand and sidelying clamshell    PT Home Exercise Plan  Access Code: [email or web address]    Consulted and Agree with Plan of Care  Patient       Patient will benefit from skilled therapeutic intervention in order to improve the following deficits and impairments:  Abnormal gait, Hypomobility, Decreased strength, Increased fascial restricitons, Pain, Increased muscle spasms, Difficulty walking, Decreased range of motion, Postural dysfunction  Visit Diagnosis: 1. Chronic bilateral low back pain without sciatica  2. Muscle weakness (generalized)        Problem List Patient Active Problem List   Diagnosis Date Noted  . Routine general medical examination at a health care facility 07/22/2015  . Hip dysplasia, acquired   . Bladder prolapse, female, acquired 01/09/2012  . MDD (major depressive disorder), single episode, mild ([place])  05/29/2010  . GLAUCOMA 12/14/2009  . Iron deficiency anemia 05/21/2007  . GERD (gastroesophageal reflux disease) 05/21/2007  . Osteoarthritis 05/21/2007    Jule Ser, PT 02/01/2019, 4:29 PM  [facility] [address] [place], [place], [address] Phone: [phone]   Fax:  [phone]  Name: Linda [name] MRN: [id number] Date of Birth: 01-22-1941

## 2019-02-02 DIAGNOSIS — G5602 Carpal tunnel syndrome, left upper limb: Secondary | ICD-10-CM | POA: Diagnosis not present

## 2019-02-02 DIAGNOSIS — G5603 Carpal tunnel syndrome, bilateral upper limbs: Secondary | ICD-10-CM | POA: Diagnosis not present

## 2019-02-02 DIAGNOSIS — G5601 Carpal tunnel syndrome, right upper limb: Secondary | ICD-10-CM | POA: Diagnosis not present

## 2019-02-05 ENCOUNTER — Ambulatory Visit: Payer: PPO | Admitting: Physical Therapy

## 2019-02-08 ENCOUNTER — Encounter: Payer: PPO | Admitting: Physical Therapy

## 2019-02-08 DIAGNOSIS — Z4789 Encounter for other orthopedic aftercare: Secondary | ICD-10-CM | POA: Insufficient documentation

## 2019-02-10 ENCOUNTER — Encounter: Payer: PPO | Admitting: Physical Therapy

## 2019-02-16 DIAGNOSIS — M79641 Pain in right hand: Secondary | ICD-10-CM | POA: Diagnosis not present

## 2019-02-17 ENCOUNTER — Encounter: Payer: Self-pay | Admitting: Physical Therapy

## 2019-02-17 ENCOUNTER — Ambulatory Visit: Payer: PPO | Attending: Internal Medicine | Admitting: Physical Therapy

## 2019-02-17 ENCOUNTER — Other Ambulatory Visit: Payer: Self-pay

## 2019-02-17 DIAGNOSIS — M6281 Muscle weakness (generalized): Secondary | ICD-10-CM

## 2019-02-17 DIAGNOSIS — G8929 Other chronic pain: Secondary | ICD-10-CM | POA: Insufficient documentation

## 2019-02-17 DIAGNOSIS — M545 Low back pain: Secondary | ICD-10-CM | POA: Insufficient documentation

## 2019-02-17 NOTE — Therapy (Signed)
[facility] [address], [place], [place], [address] Phone: [phone]   Fax:  [phone]  Physical Therapy Treatment  Patient Details  Name: Linda [name] MRN: [id number] Date of Birth: March 31, 1941 Referring Provider (PT): Hoyt Koch   Encounter Date: 02/17/2019  PT End of Session - 02/17/19 1526    Visit Number  5    Date for PT Re-Evaluation  03/18/19    PT Start Time  1526    PT Stop Time  1611    PT Time Calculation (min)  45 min    Activity Tolerance  Patient tolerated treatment well    Behavior During Therapy  [facility] for tasks assessed/performed       Past Medical History:  Diagnosis Date  . ALLERGIC RHINITIS   . Allergy   . ANEMIA-IRON DEFICIENCY   . Anxiety state, unspecified   . Carpal tunnel syndrome    pt denies   . COLONIC POLYPS, HX OF 2007  . GERD   . GLAUCOMA   . GOITER, MULTINODULAR    on [place] 2006, unchanged 6/13 with nodules all <46mm  . HIATAL HERNIA WITH REFLUX   . Hip dysplasia, acquired    B sx; eval at [facility] for same 01/2015 -ongoing PT  . OSTEOARTHRITIS   . TOBACCO USE, QUIT     Past Surgical History:  Procedure Laterality Date  . BREAST SURGERY  1960   Left breast cyst removed no complications  . COLONOSCOPY  2005  . EYE SURGERY  1991-1992   both eyes  . EYE SURGERY Left 2014   shunt behind left eye  . Right shoulder aurgery  10/2008    There were no vitals filed for this visit.  Subjective Assessment - 02/17/19 1530    Subjective  After the car ride my hips were hurting. When I do the exercises, especially the stretching, I feel much better.    Pertinent History  goes by "[name]", scoliosis and lumbar stenosis    Limitations  Walking;Standing;House hold activities    Patient Stated Goals  be able to walk and garden without pain    Currently in Pain?  Yes    Pain Score  4     Pain Location  Back    Pain Orientation  Right;Left;Lower    Pain Descriptors /  Indicators  Dull    Pain Onset  More than a month ago    Pain Frequency  Intermittent                       [facility] Adult PT Treatment/Exercise - 02/17/19 0001      Lumbar Exercises: Stretches   Active Hamstring Stretch  Right;Left;30 seconds    Active Hamstring Stretch Limitations  supine with strap    Single Knee to Chest Stretch  Right;Left;10 seconds;3 reps    Hip Flexor Stretch  Right;Left;2 reps;20 seconds    Pelvic Tilt  10 reps;5 seconds      Lumbar Exercises: Aerobic   Nustep  L3 x 6'   PT present to discuss symptoms     Lumbar Exercises: Seated   Sit to Stand  20 reps      Lumbar Exercises: Supine   Clam  15 reps;3 seconds;Limitations    Clam Limitations  red band and holding pelvic tilt    Bent Knee Raise  20 reps;2 seconds;Limitations    Bent Knee Raise Limitations  cues to maintain pelvic tilt  Bridge with clamshell  10 reps    Large Ball Abdominal Isometric  10 reps   flex and extend; side to side with red ball     Lumbar Exercises: Sidelying   Clam  Right;Left;15 reps               PT Short Term Goals - 02/17/19 1619      PT SHORT TERM GOAL #2   Title  Pt will be ind with initial HEP    Status  Achieved        PT Long Term Goals - 01/21/19 1842      PT LONG TERM GOAL #1   Title  Pt will be ind with advanced HEP for maintaining strength to perform yard work safely.    Time  8    Period  Weeks    Status  New    Target Date  03/18/19      PT LONG TERM GOAL #2   Title  Pt will report 75% less pain with yard work and walking activities.    Time  8    Period  Weeks    Status  New    Target Date  03/18/19      PT LONG TERM GOAL #3   Title  Pt will be able to safely and correctly lift medium weight like bag from the floor.    Time  8    Period  Weeks    Status  New    Target Date  03/18/19      PT LONG TERM GOAL #4   Title  Pt will be able to roll in bed without pain due to improved core strength    Time  8    Period   Weeks    Status  New    Target Date  03/18/19      PT LONG TERM GOAL #5   Title  FOTO < or = to 37% for improved function    Time  8    Period  Weeks    Status  New    Target Date  03/18/19            Plan - 02/17/19 1617    Clinical Impression Statement  Pt felt good during treatment.  She has a little increased pain with lower trunk rotation to the Rt but is able to moniter her movements to avoid painful ROM.  Pt is doing well with pelvic tilts and holding the position during exercises without holding her breath.  she was able to make updates to more strengthening added to her HEP.  Pt will cotninue to benefit form skilled PT to progress functional strength for return to maximum activity level.    PT Treatment/Interventions  ADLs/Self Care Home Management;Biofeedback;Cryotherapy;Electrical Stimulation;Moist Heat;Traction;Gait training;Therapeutic activities;Therapeutic exercise;Neuromuscular re-education;Patient/family education;Manual techniques;Taping;Dry needling;Passive range of motion    PT Next Visit Plan  f/u on HEP, continue gluteal and core strength, sit to stand with cues to blow for activation of TrA    PT Home Exercise Plan  Access Code: [email or web address]    Consulted and Agree with Plan of Care  Patient       Patient will benefit from skilled therapeutic intervention in order to improve the following deficits and impairments:  Abnormal gait, Hypomobility, Decreased strength, Increased fascial restricitons, Pain, Increased muscle spasms, Difficulty walking, Decreased range of motion, Postural dysfunction  Visit Diagnosis: 1. Chronic bilateral low back pain without sciatica   2. Muscle weakness (generalized)  Problem List Patient Active Problem List   Diagnosis Date Noted  . Routine general medical examination at a health care facility 07/22/2015  . Hip dysplasia, acquired   . Bladder prolapse, female, acquired 01/09/2012  . MDD (major depressive disorder),  single episode, mild ([place]) 05/29/2010  . GLAUCOMA 12/14/2009  . Iron deficiency anemia 05/21/2007  . GERD (gastroesophageal reflux disease) 05/21/2007  . Osteoarthritis 05/21/2007    Jule Ser, PT 02/17/2019, 4:20 PM  [facility] [address], [place], [place], [address] Phone: [phone]   Fax:  [phone]  Name: FANTASHA DANIELE MRN: [id number] Date of Birth: 05-21-1941

## 2019-02-19 ENCOUNTER — Ambulatory Visit: Payer: PPO | Admitting: Physical Therapy

## 2019-02-19 ENCOUNTER — Encounter: Payer: Self-pay | Admitting: Physical Therapy

## 2019-02-19 ENCOUNTER — Other Ambulatory Visit: Payer: Self-pay

## 2019-02-19 DIAGNOSIS — G8929 Other chronic pain: Secondary | ICD-10-CM

## 2019-02-19 DIAGNOSIS — M545 Low back pain: Secondary | ICD-10-CM | POA: Diagnosis not present

## 2019-02-19 DIAGNOSIS — M6281 Muscle weakness (generalized): Secondary | ICD-10-CM

## 2019-02-19 NOTE — Patient Instructions (Signed)
Access Code: [email or web address]  URL: [email or web address]  Date: 02/19/2019  Prepared by: Jari Favre   Exercises  Hooklying Single Knee to Chest Stretch - 3 reps - 30 hold - 2x daily - 7x weekly  Supine Posterior Pelvic Tilt - 10 reps - 5 hold - 2x daily - 7x weekly  Supine Hamstring Stretch with Strap - 2 reps - 1 sets - 30 hold - 1x daily - 7x weekly  Hip Adductors and Hamstring Stretch with Strap - 2 reps - 1 sets - 30 hold - 1x daily - 7x weekly  Hooklying Isometric Clamshell - 10 reps - 2 sets - 1x daily - 7x weekly  Clamshell - 10 reps - 2 sets - 1x daily - 7x weekly  Hip Flexor Stretch on Step - 3 reps - 1 sets - 30 hold - 1x daily - 7x weekly  Sit to Stand without Arm Support - 10 reps - 1 sets - 3x daily - 7x weekly  Seated Piriformis Stretch with Trunk Bend - 3 reps - 1 sets - 30 sec hold - 1x daily - 7x weekly

## 2019-02-19 NOTE — Therapy (Signed)
[facility] [address], [place], [place], [address] Phone: [phone]   Fax:  [phone]  Physical Therapy Treatment  Patient Details  Name: Linda [name] MRN: [id number] Date of Birth: 08/23/1940 Referring Provider (PT): Hoyt Koch   Encounter Date: 02/19/2019  PT End of Session - 02/19/19 1428    Visit Number  6    Date for PT Re-Evaluation  03/18/19    PT Start Time  3009    PT Stop Time  1511    PT Time Calculation (min)  43 min    Activity Tolerance  Patient tolerated treatment well    Behavior During Therapy  [facility] for tasks assessed/performed       Past Medical History:  Diagnosis Date  . ALLERGIC RHINITIS   . Allergy   . ANEMIA-IRON DEFICIENCY   . Anxiety state, unspecified   . Carpal tunnel syndrome    pt denies   . COLONIC POLYPS, HX OF 2007  . GERD   . GLAUCOMA   . GOITER, MULTINODULAR    on [place] 2006, unchanged 6/13 with nodules all <24mm  . HIATAL HERNIA WITH REFLUX   . Hip dysplasia, acquired    B sx; eval at [facility] for [facility] 01/2015 -ongoing PT  . OSTEOARTHRITIS   . TOBACCO USE, QUIT     Past Surgical History:  Procedure Laterality Date  . BREAST SURGERY  1960   Left breast cyst removed no complications  . COLONOSCOPY  2005  . EYE SURGERY  1991-1992   both eyes  . EYE SURGERY Left 2014   shunt behind left eye  . Right shoulder aurgery  10/2008    There were no vitals filed for this visit.  Subjective Assessment - 02/19/19 1521    Subjective  after previous session I felt stiff.  I can't roll in bed without sitting up.    Currently in Pain?  Yes    Pain Score  4     Pain Location  Back    Pain Orientation  Right;Left;Lower    Pain Descriptors / Indicators  Dull                       [facility] Adult PT Treatment/Exercise - 02/19/19 0001      Therapeutic Activites    Therapeutic Activities  Other Therapeutic Activities    Other Therapeutic Activities   educated and performed log rolling      Lumbar Exercises: Stretches   Single Knee to Chest Stretch  Right;Left;10 seconds;3 reps    Piriformis Stretch  Right;Left;3 reps;10 seconds      Lumbar Exercises: Seated   Sit to Stand  20 reps    Sit to Stand Limitations  cues to exhale like "blowing out candles" when standing in order to acivate TrA muscles      Lumbar Exercises: Supine   Clam  15 reps;3 seconds;Limitations    Clam Limitations  red band and holding pelvic tilt    Bridge with clamshell  20 reps    Bridge with [employer] Limitations  red band    Large Ball Abdominal Isometric  10 reps   flex and extend; side to side with red ball     Manual Therapy   Manual Therapy  Soft tissue mobilization    Manual therapy comments  left sidelying             PT Education - 02/19/19 1516  Education Details  Access Code: [email or web address]    Person(s) Educated  Patient    Methods  Explanation;Demonstration;Handout;Verbal cues    Comprehension  Verbalized understanding;Returned demonstration       PT Short Term Goals - 02/17/19 1619      PT SHORT TERM GOAL #2   Title  Pt will be ind with initial HEP    Status  Achieved        PT Long Term Goals - 01/21/19 1842      PT LONG TERM GOAL #1   Title  Pt will be ind with advanced HEP for maintaining strength to perform yard work safely.    Time  8    Period  Weeks    Status  New    Target Date  03/18/19      PT LONG TERM GOAL #2   Title  Pt will report 75% less pain with yard work and walking activities.    Time  8    Period  Weeks    Status  New    Target Date  03/18/19      PT LONG TERM GOAL #3   Title  Pt will be able to safely and correctly lift medium weight like bag from the floor.    Time  8    Period  Weeks    Status  New    Target Date  03/18/19      PT LONG TERM GOAL #4   Title  Pt will be able to roll in bed without pain due to improved core strength    Time  8    Period  Weeks    Status  New    Target  Date  03/18/19      PT LONG TERM GOAL #5   Title  FOTO < or = to 37% for improved function    Time  8    Period  Weeks    Status  New    Target Date  03/18/19            Plan - 02/19/19 1517    Clinical Impression Statement  Pt was a little more stiff today.  She had some muscle spasm in her lumbar paraspinals, QL, and glutes on the Rt side which responded well to manual therapy.  She was educated on log rolling and was able to roll without difficulty.  Pt will continue to benefit from skilled PT to progress core strength to more functional positions in order to return to doing usual yard work.    PT Treatment/Interventions  ADLs/Self Care Home Management;Biofeedback;Cryotherapy;Electrical Stimulation;Moist Heat;Traction;Gait training;Therapeutic activities;Therapeutic exercise;Neuromuscular re-education;Patient/family education;Manual techniques;Taping;Dry needling;Passive range of motion    PT Next Visit Plan  f/u on HEP, continue gluteal and core strength, single leg standing exercises    PT Home Exercise Plan  Access Code: [email or web address]    Consulted and Agree with Plan of Care  Patient       Patient will benefit from skilled therapeutic intervention in order to improve the following deficits and impairments:  Abnormal gait, Hypomobility, Decreased strength, Increased fascial restricitons, Pain, Increased muscle spasms, Difficulty walking, Decreased range of motion, Postural dysfunction  Visit Diagnosis: 1. Chronic bilateral low back pain without sciatica   2. Muscle weakness (generalized)        Problem List Patient Active Problem List   Diagnosis Date Noted  . Routine general medical examination at a health care facility 07/22/2015  . Hip dysplasia, acquired   .  Bladder prolapse, female, acquired 01/09/2012  . MDD (major depressive disorder), single episode, mild ([place]) 05/29/2010  . GLAUCOMA 12/14/2009  . Iron deficiency anemia 05/21/2007  . GERD (gastroesophageal reflux  disease) 05/21/2007  . Osteoarthritis 05/21/2007    Jule Ser, PT 02/19/2019, 3:27 PM  [facility] [address], [place], [place], [address] Phone: [phone]   Fax:  [phone]  Name: Linda [name] MRN: [id number] Date of Birth: 1941/07/19

## 2019-02-23 ENCOUNTER — Ambulatory Visit: Payer: PPO | Admitting: Physical Therapy

## 2019-02-23 ENCOUNTER — Encounter: Payer: Self-pay | Admitting: Physical Therapy

## 2019-02-23 ENCOUNTER — Other Ambulatory Visit: Payer: Self-pay

## 2019-02-23 DIAGNOSIS — M6281 Muscle weakness (generalized): Secondary | ICD-10-CM

## 2019-02-23 DIAGNOSIS — M545 Low back pain: Secondary | ICD-10-CM | POA: Diagnosis not present

## 2019-02-23 DIAGNOSIS — G8929 Other chronic pain: Secondary | ICD-10-CM

## 2019-02-23 NOTE — Therapy (Signed)
[facility] [address] [place], [place], [address] Phone: [phone]   Fax:  [phone]  Physical Therapy Treatment  Patient Details  Name: Linda [name] MRN: [id number] Date of Birth: [birth year] Referring Provider (PT): Hoyt Koch   Encounter Date: 02/23/2019  PT End of Session - 02/23/19 1532    Visit Number  8    Date for PT Re-Evaluation  03/18/19    PT Start Time  1532    PT Stop Time  1611    PT Time Calculation (min)  39 min    Activity Tolerance  Patient tolerated treatment well    Behavior During Therapy  [facility] for tasks assessed/performed       Past Medical History:  Diagnosis Date  . ALLERGIC RHINITIS   . Allergy   . ANEMIA-IRON DEFICIENCY   . Anxiety state, unspecified   . Carpal tunnel syndrome    pt denies   . COLONIC POLYPS, HX OF 2007  . GERD   . GLAUCOMA   . GOITER, MULTINODULAR    on [place] 2006, unchanged 6/13 with nodules all <32mm  . HIATAL HERNIA WITH REFLUX   . Hip dysplasia, acquired    B sx; eval at [facility] for same 01/2015 -ongoing PT  . OSTEOARTHRITIS   . TOBACCO USE, QUIT     Past Surgical History:  Procedure Laterality Date  . BREAST SURGERY  1960   Left breast cyst removed no complications  . COLONOSCOPY  2005  . EYE SURGERY  1991-1992   both eyes  . EYE SURGERY Left 2014   shunt behind left eye  . Right shoulder aurgery  10/2008    There were no vitals filed for this visit.  Subjective Assessment - 02/23/19 1623    Subjective  I am feeling good today, no pain.  I went swimming today and yesterday.    Currently in Pain?  No/denies                       [facility] Adult PT Treatment/Exercise - 02/23/19 0001      Lumbar Exercises: Stretches   Active Hamstring Stretch  Right;Left;30 seconds    Active Hamstring Stretch Limitations  supine with strap    Single Knee to Chest Stretch  Right;Left;10 seconds;3 reps    Piriformis Stretch   Right;Left;3 reps;10 seconds    Other Lumbar Stretch Exercise  hip adductor and TFL stretch bil 1x30 sec supine with strap      Lumbar Exercises: Aerobic   Nustep  L3 x 6'   PT present for status update     Lumbar Exercises: Standing   Wall Slides Limitations  red band around knees - pelvic tilt then slide into quarter squat - 2 sets of 10    Other Standing Lumbar Exercises  hip ext, abduction - red band - 10x each side    Other Standing Lumbar Exercises  step down with good pelvic alignment - 2" step      Modalities   Modalities  Moist Heat      Moist Heat Therapy   Number Minutes Moist Heat  --   used during stretches in supine   Moist Heat Location  Lumbar Spine      Manual Therapy   Manual Therapy  Soft tissue mobilization    Manual therapy comments  left sidelying    Soft tissue mobilization  sacral distraction, lumbar paraspinals bilat - left gluteals  PT Short Term Goals - 02/17/19 1619      PT SHORT TERM GOAL #2   Title  Pt will be ind with initial HEP    Status  Achieved        PT Long Term Goals - 02/23/19 1622      PT LONG TERM GOAL #1   Title  Pt will be ind with advanced HEP for maintaining strength to perform yard work safely.    Status  On-going      PT LONG TERM GOAL #2   Title  Pt will report 75% less pain with yard work and walking activities.    Status  On-going      PT LONG TERM GOAL #3   Title  Pt will be able to safely and correctly lift medium weight like bag from the floor.    Status  On-going      PT LONG TERM GOAL #4   Title  Pt will be able to roll in bed without pain due to improved core strength    Status  On-going      PT LONG TERM GOAL #5   Title  FOTO < or = to 37% for improved function    Status  On-going            Plan - 02/23/19 1618    Clinical Impression Statement  Pt needed cues for small step down to keep knee and pelvic alignment.  She was able to perform pelvic tilt at the wall with  minimal cues.  She did well with exercises and felt releif with moist heat during stretches.  Pt had fascial adhesions that released with sacral distraction in sidelying.  pt will continue to benefit from skilledPT to cotinue to progress core and hip strength for maximum functional outcomes.    PT Treatment/Interventions  ADLs/Self Care Home Management;Biofeedback;Cryotherapy;Electrical Stimulation;Moist Heat;Traction;Gait training;Therapeutic activities;Therapeutic exercise;Neuromuscular re-education;Patient/family education;Manual techniques;Taping;Dry needling;Passive range of motion    PT Next Visit Plan  continue gluteal and core strength, single leg standing exercises, progress as tolerated    PT Home Exercise Plan  Access Code: [email or web address]    Consulted and Agree with Plan of Care  Patient       Patient will benefit from skilled therapeutic intervention in order to improve the following deficits and impairments:  Abnormal gait, Hypomobility, Decreased strength, Increased fascial restricitons, Pain, Increased muscle spasms, Difficulty walking, Decreased range of motion, Postural dysfunction  Visit Diagnosis: 1. Chronic bilateral low back pain without sciatica   2. Muscle weakness (generalized)        Problem List Patient Active Problem List   Diagnosis Date Noted  . Routine general medical examination at a health care facility 07/22/2015  . Hip dysplasia, acquired   . Bladder prolapse, female, acquired 01/09/2012  . MDD (major depressive disorder), single episode, mild ([place]) 05/29/2010  . GLAUCOMA 12/14/2009  . Iron deficiency anemia 05/21/2007  . GERD (gastroesophageal reflux disease) 05/21/2007  . Osteoarthritis 05/21/2007    Jule Ser, PT 02/23/2019, 4:24 PM  [facility] [address], [place], [place], [address] Phone: [phone]   Fax:  [phone]  Name: VIKTORIA GRUETZMACHER MRN: [id number] Date of  Birth: 1941-08-02

## 2019-03-01 DIAGNOSIS — Z961 Presence of intraocular lens: Secondary | ICD-10-CM | POA: Diagnosis not present

## 2019-03-01 DIAGNOSIS — H401133 Primary open-angle glaucoma, bilateral, severe stage: Secondary | ICD-10-CM | POA: Diagnosis not present

## 2019-03-03 ENCOUNTER — Other Ambulatory Visit: Payer: Self-pay

## 2019-03-03 ENCOUNTER — Ambulatory Visit: Payer: PPO | Admitting: Physical Therapy

## 2019-03-03 DIAGNOSIS — H401134 Primary open-angle glaucoma, bilateral, indeterminate stage: Secondary | ICD-10-CM | POA: Diagnosis not present

## 2019-03-03 DIAGNOSIS — G8929 Other chronic pain: Secondary | ICD-10-CM

## 2019-03-03 DIAGNOSIS — M545 Low back pain, unspecified: Secondary | ICD-10-CM

## 2019-03-03 DIAGNOSIS — M6281 Muscle weakness (generalized): Secondary | ICD-10-CM

## 2019-03-03 NOTE — Therapy (Signed)
[facility] [address] [place], [place], [address] Phone: [phone]   Fax:  [phone]  Physical Therapy Treatment  Patient Details  Name: Linda [name] MRN: [id number] Date of Birth: Mar 20, 1941 Referring Provider (PT): Hoyt Koch   Encounter Date: 03/03/2019  PT End of Session - 03/03/19 0906    Visit Number  9    Date for PT Re-Evaluation  03/18/19    PT Start Time  0906    PT Stop Time  0949    PT Time Calculation (min)  43 min    Activity Tolerance  Patient tolerated treatment well    Behavior During Therapy  [facility] for tasks assessed/performed       Past Medical History:  Diagnosis Date  . ALLERGIC RHINITIS   . Allergy   . ANEMIA-IRON DEFICIENCY   . Anxiety state, unspecified   . Carpal tunnel syndrome    pt denies   . COLONIC POLYPS, HX OF 2007  . GERD   . GLAUCOMA   . GOITER, MULTINODULAR    on [place] 2006, unchanged 6/13 with nodules all <30mm  . HIATAL HERNIA WITH REFLUX   . Hip dysplasia, acquired    B sx; eval at [facility] for same 01/2015 -ongoing PT  . OSTEOARTHRITIS   . TOBACCO USE, QUIT     Past Surgical History:  Procedure Laterality Date  . BREAST SURGERY  1960   Left breast cyst removed no complications  . COLONOSCOPY  2005  . EYE SURGERY  1991-1992   both eyes  . EYE SURGERY Left 2014   shunt behind left eye  . Right shoulder aurgery  10/2008    There were no vitals filed for this visit.  Subjective Assessment - 03/03/19 0954    Subjective  No pain, just some stiffness in my back. I go to the pool tomorrow.    Pertinent History  goes by "[name]", scoliosis and lumbar stenosis    Currently in Pain?  No/denies    Multiple Pain Sites  No                       [facility] Adult PT Treatment/Exercise - 03/03/19 0001      Lumbar Exercises: Stretches   Active Hamstring Stretch  Right;Left;1 rep;30 seconds    Active Hamstring Stretch Limitations  supine with  strap   TC to get knee straighter   Single Knee to Chest Stretch  Right;Left;2 reps;20 seconds    Piriformis Stretch  Right;Left;1 rep;60 seconds    Figure 4 Stretch  --   LE on the mat via more yoga-like vs adding hip flexion     Lumbar Exercises: Aerobic   Nustep  L3 x 7 min   PTA present to monitor     Lumbar Exercises: Supine   Glut Set  5 reps;5 seconds   Ball squeeze, glute squeeze cocontraction   Clam  15 reps    Clam Limitations  greenband    Bridge with [employer]  5 reps;3 seconds    Bridge with Ball Squeeze Limitations  VC to move slow and gradually incease ROM since it typically begins with pain but improves. VC to contract glluteals      Lumbar Exercises: Sidelying   Clam  Right;Left;10 reps    Clam Limitations  Tapping to center of buttocks to facilitate gluteals              PT Education -  03/03/19 0955    Education Details  Discussed her pool exercises and gave pt some tips on how to perform her hip extension better.    Person(s) Educated  Patient    Methods  Explanation;Demonstration    Comprehension  Returned demonstration;Verbalized understanding       PT Short Term Goals - 02/17/19 1619      PT SHORT TERM GOAL #2   Title  Pt will be ind with initial HEP    Status  Achieved        PT Long Term Goals - 02/23/19 1622      PT LONG TERM GOAL #1   Title  Pt will be ind with advanced HEP for maintaining strength to perform yard work safely.    Status  On-going      PT LONG TERM GOAL #2   Title  Pt will report 75% less pain with yard work and walking activities.    Status  On-going      PT LONG TERM GOAL #3   Title  Pt will be able to safely and correctly lift medium weight like bag from the floor.    Status  On-going      PT LONG TERM GOAL #4   Title  Pt will be able to roll in bed without pain due to improved core strength    Status  On-going      PT LONG TERM GOAL #5   Title  FOTO < or = to 37% for improved function    Status   On-going            Plan - 03/03/19 0906    Clinical Impression Statement  Pt presents pain free today, a stiffness in her back. She returns to the pool tomorrow for her exercises/personal HEP. Pt worked on bettering her gluteal contraction and building strength. PTA instructed pt how to perform a gentle hip ER stretch by keeping her LE on the mat vs adding the hip flexion to her piriformis stretch, Pt found this position to be less painful and could stay in the stretch with greater ease.    Personal Factors and Comorbidities  Age;Comorbidity 2    Comorbidities  chronic, stenosis, scoliosis    Examination-Activity Limitations  Squat;Stand;Lift    Examination-Participation Restrictions  Yard Work    Stability/Clinical Decision Making  Stable/Uncomplicated    Rehab Potential  Excellent    PT Frequency  2x / week    PT Duration  8 weeks    PT Treatment/Interventions  ADLs/Self Care Home Management;Biofeedback;Cryotherapy;Electrical Stimulation;Moist Heat;Traction;Gait training;Therapeutic activities;Therapeutic exercise;Neuromuscular re-education;Patient/family education;Manual techniques;Taping;Dry needling;Passive range of motion    PT Next Visit Plan  continue gluteal and core strength, single leg standing exercises, progress as tolerated    PT Home Exercise Plan  Access Code: [email or web address]    Consulted and Agree with Plan of Care  Patient       Patient will benefit from skilled therapeutic intervention in order to improve the following deficits and impairments:  Abnormal gait, Hypomobility, Decreased strength, Increased fascial restricitons, Pain, Increased muscle spasms, Difficulty walking, Decreased range of motion, Postural dysfunction  Visit Diagnosis: 1. Chronic bilateral low back pain without sciatica   2. Muscle weakness (generalized)        Problem List Patient Active Problem List   Diagnosis Date Noted  . Routine general medical examination at a health care facility  07/22/2015  . Hip dysplasia, acquired   . Bladder prolapse, female, acquired 01/09/2012  .  MDD (major depressive disorder), single episode, mild ([place]) 05/29/2010  . GLAUCOMA 12/14/2009  . Iron deficiency anemia 05/21/2007  . GERD (gastroesophageal reflux disease) 05/21/2007  . Osteoarthritis 05/21/2007    Keeon Zurn, PTA 03/03/2019, 9:56 AM   [facility] [address] [place], [place], [address] Phone: [phone]   Fax:  [phone]  Name: Linda [name] MRN: [id number] Date of Birth: Dec 10, 1940

## 2019-03-05 ENCOUNTER — Encounter: Payer: Self-pay | Admitting: Physical Therapy

## 2019-03-05 ENCOUNTER — Ambulatory Visit: Payer: PPO | Admitting: Physical Therapy

## 2019-03-05 ENCOUNTER — Other Ambulatory Visit: Payer: Self-pay

## 2019-03-05 DIAGNOSIS — M6281 Muscle weakness (generalized): Secondary | ICD-10-CM

## 2019-03-05 DIAGNOSIS — M545 Low back pain, unspecified: Secondary | ICD-10-CM

## 2019-03-05 DIAGNOSIS — G8929 Other chronic pain: Secondary | ICD-10-CM

## 2019-03-05 NOTE — Therapy (Signed)
[facility] [address], [place], [place], [address] Phone: [phone]   Fax:  [phone]  Physical Therapy Treatment  Patient Details  Name: Linda [name] MRN: [id number] Date of Birth: October 13, 1940 Referring Provider (PT): Pricilla Holm A  Progress Note Reporting Period 01/21/19 to 03/05/19  See note below for Objective Data and Assessment of Progress/Goals.      Encounter Date: 03/05/2019  PT End of Session - 03/05/19 1143    Visit Number  10    Date for PT Re-Evaluation  03/18/19    PT Start Time  1103    PT Stop Time  1147    PT Time Calculation (min)  44 min    Activity Tolerance  Patient tolerated treatment well    Behavior During Therapy  [facility] for tasks assessed/performed       Past Medical History:  Diagnosis Date  . ALLERGIC RHINITIS   . Allergy   . ANEMIA-IRON DEFICIENCY   . Anxiety state, unspecified   . Carpal tunnel syndrome    pt denies   . COLONIC POLYPS, HX OF 2007  . GERD   . GLAUCOMA   . GOITER, MULTINODULAR    on [place] 2006, unchanged 6/13 with nodules all <75mm  . HIATAL HERNIA WITH REFLUX   . Hip dysplasia, acquired    B sx; eval at [facility] for same 01/2015 -ongoing PT  . OSTEOARTHRITIS   . TOBACCO USE, QUIT     Past Surgical History:  Procedure Laterality Date  . BREAST SURGERY  1960   Left breast cyst removed no complications  . COLONOSCOPY  2005  . EYE SURGERY  1991-1992   both eyes  . EYE SURGERY Left 2014   shunt behind left eye  . Right shoulder aurgery  10/2008    There were no vitals filed for this visit.  Subjective Assessment - 03/05/19 1105    Subjective  My legs get tired after pool exercise, I've only gone twice this week.  Pt states she feels about 95% improvement since starting PT.  I haven't tried gardening yet, partly due to heat.    Pertinent History  goes by "Linda [name]", scoliosis and lumbar stenosis    Limitations  Walking;Standing;House hold  activities    Patient Stated Goals  be able to walk and garden without pain    Currently in Pain?  No/denies    Pain Onset  More than a month ago    Aggravating Factors   yard work, walking    Effect of Pain on Daily Activities  sleeping         [place] PT Assessment - 03/05/19 0001      Assessment   Medical Diagnosis  M54.5 (ICD-10-CM) - Low back pain, unspecified back pain laterality, unspecified chronicity, unspecified whether sciatica present    Referring Provider (PT)  Pricilla Holm A    Onset Date/Surgical Date  --   3 weeks ago   Prior Therapy  No      Observation/Other Assessments   Focus on Therapeutic Outcomes (FOTO)   30% limited      Strength   Right Hip Flexion  4+/5    Right Hip Extension  4+/5    Right Hip External Rotation   4+/5    Right Hip ABduction  4+/5    Right Hip ADduction  5/5    Left Hip Flexion  4+/5    Left Hip Extension  4+/5    Left Hip  External Rotation  5/5    Left Hip ABduction  5/5    Left Hip ADduction  5/5                   [facility] Adult PT Treatment/Exercise - 03/05/19 0001      Exercises   Exercises  Lumbar;Knee/Hip      Lumbar Exercises: Stretches   Single Knee to Chest Stretch  Right;Left    Single Knee to Chest Stretch Limitations  10 reps, 3 sec hold, alternating    Lower Trunk Rotation  20 seconds;3 reps    Hip Flexor Stretch  Right;Left;3 reps;20 seconds    Hip Flexor Stretch Limitations  lunge style on steps, PT cued tailbone tuck    Pelvic Tilt  10 reps    Standing Side Bend  Right;Left    Standing Side Bend Limitations  20 reps, alt, no hold      Lumbar Exercises: Aerobic   Recumbent Bike  L2 x 8'   PT present for FOTO survey and goal review     Lumbar Exercises: Sidelying   Clam  Right;Left;10 reps    Clam Limitations  PT cued stacking hips and lifting top knee higher             PT Education - 03/05/19 1143    Education Details  Access Code: [email or web address]    Person(s) Educated  Patient     Methods  Explanation;Demonstration;Tactile cues;Verbal cues;Handout    Comprehension  Verbalized understanding;Returned demonstration       PT Short Term Goals - 03/05/19 1110      PT SHORT TERM GOAL #1   Title  pt will report 25% less pain with walking    Status  Achieved      PT SHORT TERM GOAL #2   Title  Pt will be ind with initial HEP    Status  Achieved        PT Long Term Goals - 03/05/19 1109      PT LONG TERM GOAL #1   Title  Pt will be ind with advanced HEP for maintaining strength to perform yard work safely.    Status  On-going      PT LONG TERM GOAL #2   Title  Pt will report 75% less pain with yard work and walking activities.    Baseline  hasn't done yard work yet, walking 100% better    Status  On-going      PT LONG TERM GOAL #3   Title  Pt will be able to safely and correctly lift medium weight like bag from the floor.    Status  Achieved      PT LONG TERM GOAL #4   Title  Pt will be able to roll in bed without pain due to improved core strength    Status  On-going      PT LONG TERM GOAL #5   Title  FOTO < or = to 37% for improved function    Baseline  30% on 03/05/19    Status  Achieved            Plan - 03/05/19 1144    Clinical Impression Statement  Pt reports pain improvement of 95%, and FOTO score is impoved from 56% to 30% limited.  She admits she isn't as compliant with HEP and wanted a more streamlined simple approach to some mobility for her lumbar spine.  PT discussed grouping exercises into positional groups (supine, seated, standing)  and sprinkling them throughout her day.  Pt would like to explore dry needling to see if this will help her ongoing stiffness in lumbar spine and hips next visit.  She is nearing end of cert on 10/12/80 and will likely be ready for discharge at that time.  She will continue to benefit from manual techniques to improve spinal mobility including dry needling and progression of HEP.    Comorbidities  chronic,  stenosis, scoliosis    Rehab Potential  Excellent    PT Frequency  2x / week    PT Duration  8 weeks    PT Treatment/Interventions  ADLs/Self Care Home Management;Biofeedback;Cryotherapy;Electrical Stimulation;Moist Heat;Traction;Gait training;Therapeutic activities;Therapeutic exercise;Neuromuscular re-education;Patient/family education;Manual techniques;Taping;Dry needling;Passive range of motion    PT Next Visit Plan  assess for d/n for lumbar/hips (Pt request), f/u on HEP compliance, cont LE and core strength, SLS    PT Home Exercise Plan  Access Code: [email or web address]    Consulted and Agree with Plan of Care  Patient       Patient will benefit from skilled therapeutic intervention in order to improve the following deficits and impairments:  Abnormal gait, Hypomobility, Decreased strength, Increased fascial restricitons, Pain, Increased muscle spasms, Difficulty walking, Decreased range of motion, Postural dysfunction  Visit Diagnosis: 1. Chronic bilateral low back pain without sciatica   2. Muscle weakness (generalized)        Problem List Patient Active Problem List   Diagnosis Date Noted  . Routine general medical examination at a health care facility 07/22/2015  . Hip dysplasia, acquired   . Bladder prolapse, female, acquired 01/09/2012  . MDD (major depressive disorder), single episode, mild ([place]) 05/29/2010  . GLAUCOMA 12/14/2009  . Iron deficiency anemia 05/21/2007  . GERD (gastroesophageal reflux disease) 05/21/2007  . Osteoarthritis 05/21/2007    Baruch Merl, PT 03/05/19 11:56 AM   [facility] [address] [place], [place], [address] Phone: [phone]   Fax:  [phone]  Name: Linda [name] MRN: [id number] Date of Birth: 1940-11-30

## 2019-03-05 NOTE — Patient Instructions (Signed)
Access Code: [email or web address]  URL: [email or web address]  Date: 03/05/2019  Prepared by: Venetia Night Sheyanne Munley   Exercises  Supine Hamstring Stretch with Strap - 2 reps - 1 sets - 30 hold - 1x daily - 7x weekly  Hip Adductors and Hamstring Stretch with Strap - 2 reps - 1 sets - 30 hold - 1x daily - 7x weekly  Hooklying Isometric Clamshell - 10 reps - 2 sets - 1x daily - 7x weekly  Clamshell - 10 reps - 2 sets - 1x daily - 7x weekly  Sit to Stand without Arm Support - 10 reps - 1 sets - 3x daily - 7x weekly  Seated Piriformis Stretch with Trunk Bend - 3 reps - 1 sets - 30 sec hold - 1x daily - 7x weekly  Hip Flexor Stretch on Step - 3 reps - 3 sets - 20 hold - 1x daily - 7x weekly  Standing Sidebends - 10 reps - 3 sets - 1x daily - 7x weekly  Supine Lower Trunk Rotation - 5 reps - 1 sets - 10 hold - 1x daily - 7x weekly  Hooklying Single Knee to Chest - 10 reps - 2 sets - 3 hold - 1x daily - 7x weekly  Supine Posterior Pelvic Tilt - 10 reps - 3 sets - 1x daily - 7x weekly

## 2019-03-08 DIAGNOSIS — M79641 Pain in right hand: Secondary | ICD-10-CM | POA: Diagnosis not present

## 2019-03-15 ENCOUNTER — Ambulatory Visit: Payer: PPO | Attending: Internal Medicine | Admitting: Physical Therapy

## 2019-03-15 ENCOUNTER — Other Ambulatory Visit: Payer: Self-pay

## 2019-03-15 ENCOUNTER — Encounter: Payer: Self-pay | Admitting: Physical Therapy

## 2019-03-15 DIAGNOSIS — M545 Low back pain, unspecified: Secondary | ICD-10-CM

## 2019-03-15 DIAGNOSIS — G8929 Other chronic pain: Secondary | ICD-10-CM | POA: Insufficient documentation

## 2019-03-15 DIAGNOSIS — M6281 Muscle weakness (generalized): Secondary | ICD-10-CM | POA: Insufficient documentation

## 2019-03-15 NOTE — Therapy (Signed)
[facility] [address], [place], [place], [address] Phone: [phone]   Fax:  [phone]  Physical Therapy Treatment  Patient Details  Name: Linda [name] MRN: [id number] Date of Birth: May 07, 1941 Referring Provider (PT): Hoyt Koch   Encounter Date: 03/15/2019  PT End of Session - 03/15/19 1150    Visit Number  11    Date for PT Re-Evaluation  03/18/19    PT Start Time  1150    PT Stop Time  1225    PT Time Calculation (min)  35 min    Activity Tolerance  Patient tolerated treatment well;Patient limited by fatigue    Behavior During Therapy  [facility] for tasks assessed/performed       Past Medical History:  Diagnosis Date  . ALLERGIC RHINITIS   . Allergy   . ANEMIA-IRON DEFICIENCY   . Anxiety state, unspecified   . Carpal tunnel syndrome    pt denies   . COLONIC POLYPS, HX OF 2007  . GERD   . GLAUCOMA   . GOITER, MULTINODULAR    on [place] 2006, unchanged 6/13 with nodules all <36m  . HIATAL HERNIA WITH REFLUX   . Hip dysplasia, acquired    [facility]  . OSTEOARTHRITIS   . TOBACCO USE, QUIT     Past Surgical History:  Procedure Laterality Date  . BREAST SURGERY  1960   Left breast cyst removed no complications  . COLONOSCOPY  2005  . EYE SURGERY  1991-1992   both eyes  . EYE SURGERY Left 2014   shunt behind left eye  . Right shoulder aurgery  10/2008    There were no vitals filed for this visit.  Subjective Assessment - 03/15/19 1151    Subjective  I just came from the pool, feeling good.    Pertinent History  goes by "BInez [name], scoliosis and lumbar stenosis    Currently in Pain?  No/denies                       [facility] - 03[facility] 0001      Lumbar Exercises: Stretches   Active Hamstring Stretch  Left;2 reps;20 seconds    Active Hamstring Stretch Limitations  Leg across the body with strap:20 sec 2x     Pelvic Tilt  10 reps;5 seconds    Standing Side Bend Limitations  seated 5x bil  VC for technique      Lumbar Exercises: Aerobic   Recumbent Bike  L2 x 8 min   PTA present to discuss status     Lumbar Exercises: Supine   Clam  --   with pelvic tilt and green band 10-15x   Bridge with clamshell  10 reps;1 second    Bridge with BCardinal HealthLimitations  green band      Knee/Hip Exercises: Seated   Sit to [employer] 2 sets;10 reps;without UE support               PT Short Term Goals - 03/05/19 1110      PT SHORT TERM GOAL #1   Title  pt will report 25% less pain with walking    Status  Achieved      PT SHORT TERM GOAL #2   Title  Pt will be ind with initial HEP    Status  Achieved        PT Long Term  Goals - 03/05/19 1109      PT LONG TERM GOAL #1   Title  Pt will be ind with advanced HEP for maintaining strength to perform yard work safely.    Status  On-going      PT LONG TERM GOAL #2   Title  Pt will report 75% less pain with yard work and walking activities.    Baseline  hasn't done yard work yet, walking 100% better    Status  On-going      PT LONG TERM GOAL #3   Title  Pt will be able to safely and correctly lift medium weight like bag from the floor.    Status  Achieved      PT LONG TERM GOAL #4   Title  Pt will be able to roll in bed without pain due to improved core strength    Status  On-going      PT LONG TERM GOAL #5   Title  FOTO < or = to 37% for improved function    Baseline  30% on 03/05/19    Status  Achieved            Plan - 03/15/19 1150    Clinical Impression Statement  Pt just came from 45 min pool workout. She was able to complete her stretches and core/hip strength without any pain but she did fatigue mostly likely from her pool workout. All short term goals are met. Overall pt reports she feels 95% better and more than likely will want to DC and will consider returning later in the year if she feels she needs it.,    Personal  Factors and Comorbidities  Age;Comorbidity 2    Comorbidities  chronic, stenosis, scoliosis    Examination-Activity Limitations  Squat;Stand;Lift    Examination-Participation Restrictions  Yard Work    Stability/Clinical Decision Making  Stable/Uncomplicated    Rehab Potential  Excellent    PT Frequency  2x / week    PT Duration  8 weeks    PT Treatment/Interventions  ADLs/Self Care Home Management;Biofeedback;Cryotherapy;Electrical Stimulation;Moist Heat;Traction;Gait training;Therapeutic activities;Therapeutic exercise;Neuromuscular re-education;Patient/family education;Manual techniques;Taping;Dry needling;Passive range of motion    PT Next Visit Plan  reassessmen session next    PT Home Exercise Plan  Access Code: [email or web address]    Consulted and Agree with Plan of Care  Patient       Patient will benefit from skilled therapeutic intervention in order to improve the following deficits and impairments:  Abnormal gait, Hypomobility, Decreased strength, Increased fascial restricitons, Pain, Increased muscle spasms, Difficulty walking, Decreased range of motion, Postural dysfunction  Visit Diagnosis: 1. Muscle weakness (generalized)   2. Chronic bilateral low back pain without sciatica        Problem List Patient Active Problem List   Diagnosis Date Noted  . Routine general medical examination at a health care facility 07/22/2015  . Hip dysplasia, acquired   . Bladder prolapse, female, acquired 01/09/2012  . MDD (major depressive disorder), single episode, mild ([place]) 05/29/2010  . GLAUCOMA 12/14/2009  . Iron deficiency anemia 05/21/2007  . GERD (gastroesophageal reflux disease) 05/21/2007  . Osteoarthritis 05/21/2007    Marlan Steward, PTA 03/15/2019, 12:28 PM  [facility] [address] [place], [place], [address] Phone: [phone]   Fax:  [phone]  Name: Linda [name] MRN: [id number] Date of Birth:  December 07, 1940

## 2019-03-16 ENCOUNTER — Encounter: Payer: PPO | Admitting: Physical Therapy

## 2019-03-16 DIAGNOSIS — L900 Lichen sclerosus et atrophicus: Secondary | ICD-10-CM | POA: Diagnosis not present

## 2019-03-16 DIAGNOSIS — L812 Freckles: Secondary | ICD-10-CM | POA: Diagnosis not present

## 2019-03-16 DIAGNOSIS — L821 Other seborrheic keratosis: Secondary | ICD-10-CM | POA: Diagnosis not present

## 2019-03-17 DIAGNOSIS — Z1231 Encounter for screening mammogram for malignant neoplasm of breast: Secondary | ICD-10-CM | POA: Diagnosis not present

## 2019-03-17 LAB — HM MAMMOGRAPHY

## 2019-03-18 ENCOUNTER — Other Ambulatory Visit: Payer: Self-pay

## 2019-03-18 ENCOUNTER — Ambulatory Visit: Payer: PPO | Admitting: Physical Therapy

## 2019-03-18 ENCOUNTER — Encounter: Payer: Self-pay | Admitting: Physical Therapy

## 2019-03-18 ENCOUNTER — Encounter: Payer: Self-pay | Admitting: Internal Medicine

## 2019-03-18 DIAGNOSIS — M6281 Muscle weakness (generalized): Secondary | ICD-10-CM

## 2019-03-18 DIAGNOSIS — G8929 Other chronic pain: Secondary | ICD-10-CM

## 2019-03-18 NOTE — Progress Notes (Signed)
Abstracted and sent to scan  

## 2019-03-18 NOTE — Therapy (Signed)
[facility] [address], [place], [place], [address] Phone: [phone]   Fax:  [phone]  Physical Therapy Treatment  Patient Details  Name: Linda [name] MRN: [id number] Date of Birth: 09-13-1940 Referring Provider (PT): Hoyt Koch   Encounter Date: 03/18/2019  PT End of Session - 03/18/19 1445    Visit Number  12    Date for PT Re-Evaluation  03/18/19    PT Start Time  8333    PT Stop Time  1525    PT Time Calculation (min)  40 min    Activity Tolerance  Patient tolerated treatment well;Patient limited by fatigue    Behavior During Therapy  [facility] for tasks assessed/performed       Past Medical History:  Diagnosis Date  . ALLERGIC RHINITIS   . Allergy   . ANEMIA-IRON DEFICIENCY   . Anxiety state, unspecified   . Carpal tunnel syndrome    pt denies   . COLONIC POLYPS, HX OF 2007  . GERD   . GLAUCOMA   . GOITER, MULTINODULAR    on [place] 2006, unchanged 6/13 with nodules all <36m  . HIATAL HERNIA WITH REFLUX   . Hip dysplasia, acquired    [facility]  . OSTEOARTHRITIS   . TOBACCO USE, QUIT     Past Surgical History:  Procedure Laterality Date  . BREAST SURGERY  1960   Left breast cyst removed no complications  . COLONOSCOPY  2005  . EYE SURGERY  1991-1992   both eyes  . EYE SURGERY Left 2014   shunt behind left eye  . Right shoulder aurgery  10/2008    There were no vitals filed for this visit.  Subjective Assessment - 03/18/19 1453    Subjective  I am feeling pretty good.  I need to figure out how to pull weeds in the garden.  I usually try to do too much and that causes my back to hurt.    Pertinent History  goes by "Linda [name], scoliosis and lumbar stenosis    Patient Stated Goals  be able to walk and garden without pain    Currently in Pain?  No/denies                       [facility] 03[facility]19 0001       Therapeutic Activites    Therapeutic Activities  Other Therapeutic Activities    Other Therapeutic Activities  dead lift standing and then just engage core in sitting with forward flex - working on posture for gardening with trunk stability      Lumbar Exercises: Stretches   Active Hamstring Stretch  Left;2 reps;20 seconds    Piriformis Stretch  Right;Left;1 rep;60 seconds      Lumbar Exercises: Aerobic   Nustep  L3 x 8 min   PT present to monitor and re-assess              PT Short Term Goals - 03/05/19 1110      PT SHORT TERM GOAL #1   Title  pt will report 25% less pain with walking    Status  Achieved      PT SHORT TERM GOAL #2   Title  Pt will be ind with initial HEP    Status  Achieved        PT Long Term Goals - 03/18/19 1500  PT LONG TERM GOAL #1   Title  Pt will be ind with advanced HEP for maintaining strength to perform yard work safely.    Status  Achieved      PT LONG TERM GOAL #2   Title  Pt will report 75% less pain with yard work and walking activities.    Baseline  85% improved with yard work yet, walking 100% better    Status  Achieved      PT LONG TERM GOAL #3   Title  Pt will be able to safely and correctly lift medium weight like bag from the floor.    Status  Achieved      PT LONG TERM GOAL #4   Title  Pt will be able to roll in bed without pain due to improved core strength    Status  Achieved      PT LONG TERM GOAL #5   Title  FOTO < or = to 37% for improved function    Baseline  30% on 03/05/19    Status  Achieved            Plan - 03/18/19 1517    Clinical Impression Statement  Pt did well with exercises in HEP.  She is ind with her exercises.  We reviewed techniques on reaching down to weed in the garden.  Pt feels comfortable with all exercises and functional activities todays.  She has met goals as noted above.  Pt recommended to discharge with skilled PT today.    PT Treatment/Interventions  ADLs/Self Care Home  Management;Biofeedback;Cryotherapy;Electrical Stimulation;Moist Heat;Traction;Gait training;Therapeutic activities;Therapeutic exercise;Neuromuscular re-education;Patient/family education;Manual techniques;Taping;Dry needling;Passive range of motion    PT Next Visit Plan  d/c today    PT Home Exercise Plan  Access Code: [email or web address]    Consulted and Agree with Plan of Care  Patient       Patient will benefit from skilled therapeutic intervention in order to improve the following deficits and impairments:  Abnormal gait, Hypomobility, Decreased strength, Increased fascial restricitons, Pain, Increased muscle spasms, Difficulty walking, Decreased range of motion, Postural dysfunction  Visit Diagnosis: 1. Muscle weakness (generalized)   2. Chronic bilateral low back pain without sciatica        Problem List Patient Active Problem List   Diagnosis Date Noted  . Routine general medical examination at a health care facility 07/22/2015  . Hip dysplasia, acquired   . Bladder prolapse, female, acquired 01/09/2012  . MDD (major depressive disorder), single episode, mild ([place]) 05/29/2010  . GLAUCOMA 12/14/2009  . Iron deficiency anemia 05/21/2007  . GERD (gastroesophageal reflux disease) 05/21/2007  . Osteoarthritis 05/21/2007    Jule Ser, PT 03/18/2019, 3:33 PM  [facility] [address] [place], [place], [address] Phone: [phone]   Fax:  [phone]  Name: Linda [name] MRN: [id number] Date of Birth: 07/10/1941  PHYSICAL THERAPY DISCHARGE SUMMARY  Visits from Start of Care: 12  Current functional level related to goals / functional outcomes: See above   Remaining deficits: See above   Education / Equipment: HEP  Plan: Patient agrees to discharge.  Patient goals were met. Patient is being discharged due to meeting the stated rehab goals.  ?????    [employer], PT 03/18/19 3:33 PM

## 2019-04-05 DIAGNOSIS — H00012 Hordeolum externum right lower eyelid: Secondary | ICD-10-CM | POA: Diagnosis not present

## 2019-04-21 DIAGNOSIS — L900 Lichen sclerosus et atrophicus: Secondary | ICD-10-CM | POA: Diagnosis not present

## 2019-06-15 DIAGNOSIS — Z23 Encounter for immunization: Secondary | ICD-10-CM | POA: Diagnosis not present

## 2019-06-15 DIAGNOSIS — L920 Granuloma annulare: Secondary | ICD-10-CM | POA: Diagnosis not present

## 2019-07-13 ENCOUNTER — Other Ambulatory Visit: Payer: Self-pay

## 2019-07-13 DIAGNOSIS — Z20822 Contact with and (suspected) exposure to covid-19: Secondary | ICD-10-CM

## 2019-07-14 LAB — NOVEL CORONAVIRUS, NAA: SARS-CoV-2, NAA: NOT DETECTED

## 2019-07-22 DIAGNOSIS — L810 Postinflammatory hyperpigmentation: Secondary | ICD-10-CM | POA: Diagnosis not present

## 2019-07-22 DIAGNOSIS — L920 Granuloma annulare: Secondary | ICD-10-CM | POA: Diagnosis not present

## 2019-07-22 DIAGNOSIS — Z23 Encounter for immunization: Secondary | ICD-10-CM | POA: Diagnosis not present

## 2019-07-22 DIAGNOSIS — L820 Inflamed seborrheic keratosis: Secondary | ICD-10-CM | POA: Diagnosis not present

## 2019-08-16 DIAGNOSIS — H401134 Primary open-angle glaucoma, bilateral, indeterminate stage: Secondary | ICD-10-CM | POA: Diagnosis not present

## 2019-08-20 ENCOUNTER — Emergency Department (HOSPITAL_COMMUNITY): Payer: PPO

## 2019-08-20 ENCOUNTER — Encounter (HOSPITAL_COMMUNITY): Payer: Self-pay

## 2019-08-20 ENCOUNTER — Emergency Department (HOSPITAL_COMMUNITY)
Admission: EM | Admit: 2019-08-20 | Discharge: 2019-08-20 | Disposition: A | Discharge status: Home / Self Care | Payer: PPO | Attending: Emergency Medicine | Admitting: Emergency Medicine

## 2019-08-20 ENCOUNTER — Other Ambulatory Visit: Payer: Self-pay

## 2019-08-20 DIAGNOSIS — Y939 Activity, unspecified: Secondary | ICD-10-CM | POA: Insufficient documentation

## 2019-08-20 DIAGNOSIS — Y999 Unspecified external cause status: Secondary | ICD-10-CM | POA: Insufficient documentation

## 2019-08-20 DIAGNOSIS — Z87891 Personal history of nicotine dependence: Secondary | ICD-10-CM | POA: Diagnosis not present

## 2019-08-20 DIAGNOSIS — S82892A Other fracture of left lower leg, initial encounter for closed fracture: Secondary | ICD-10-CM | POA: Insufficient documentation

## 2019-08-20 DIAGNOSIS — Y929 Unspecified place or not applicable: Secondary | ICD-10-CM | POA: Diagnosis not present

## 2019-08-20 DIAGNOSIS — S99912A Unspecified injury of left ankle, initial encounter: Secondary | ICD-10-CM | POA: Diagnosis present

## 2019-08-20 DIAGNOSIS — X501XXA Overexertion from prolonged static or awkward postures, initial encounter: Secondary | ICD-10-CM | POA: Insufficient documentation

## 2019-08-20 DIAGNOSIS — Z79899 Other long term (current) drug therapy: Secondary | ICD-10-CM | POA: Insufficient documentation

## 2019-08-20 DIAGNOSIS — S9302XA Subluxation of left ankle joint, initial encounter: Secondary | ICD-10-CM | POA: Diagnosis not present

## 2019-08-20 DIAGNOSIS — S82832A Other fracture of upper and lower end of left fibula, initial encounter for closed fracture: Secondary | ICD-10-CM | POA: Diagnosis not present

## 2019-08-20 MED ORDER — LIDOCAINE-EPINEPHRINE (PF) 2 %-1:200000 IJ SOLN
10.0000 mL | Freq: Once | INTRAMUSCULAR | Status: AC
  Administered 2019-08-20: 10 mL via INTRADERMAL

## 2019-08-20 MED ORDER — FENTANYL CITRATE (PF) 100 MCG/2ML IJ SOLN
100.0000 ug | Freq: Once | INTRAMUSCULAR | Status: AC
  Administered 2019-08-20: 100 ug via INTRAMUSCULAR
  Filled 2019-08-20: qty 2

## 2019-08-20 MED ORDER — MORPHINE SULFATE 15 MG PO TABS: 15.0000 mg | ORAL_TABLET | ORAL | 0 refills | Status: DC | PRN

## 2019-08-20 MED ORDER — OXYCODONE HCL 5 MG PO TABS
5.0000 mg | ORAL_TABLET | Freq: Once | ORAL | Status: AC
  Administered 2019-08-20: 5 mg via ORAL
  Filled 2019-08-20: qty 1

## 2019-08-20 MED ORDER — LIDOCAINE-EPINEPHRINE 2 %-1:100000 IJ SOLN
INTRAMUSCULAR | Status: AC
  Administered 2019-08-20: 1 mL
  Filled 2019-08-20: qty 1

## 2019-08-20 MED ORDER — ACETAMINOPHEN 500 MG PO TABS
1000.0000 mg | ORAL_TABLET | Freq: Once | ORAL | Status: DC
  Filled 2019-08-20: qty 2

## 2019-08-20 NOTE — ED Notes (Signed)
Ortho tech states that he will be on his way shortly.

## 2019-08-20 NOTE — Progress Notes (Addendum)
TOC CM spoke to pt and her neighbor went to [facility] and picked up her a knee walker. She borrowed a RW from a friend. Her neighbor is a physical therapist and will show her how to use. She has an appt on Monday with an Orthopedist. ED provider updated. [place], [place] ED TOC CM [phone]

## 2019-08-20 NOTE — ED Notes (Signed)
Call Ortho Tech  @0030 

## 2019-08-20 NOTE — ED Provider Notes (Signed)
[name] Provider Note   CSN: [id number] Arrival date & time: 08/20/19  0003     History Chief Complaint  Patient presents with  . Ankle Pain    Linda [name] is a 79 y.o. female.  79 yo F with a chief complaint of left ankle pain.  The patient was going down the stairs in a hurry and she stepped on the edge of the step and inverted her ankle.  She felt a pop and was unable to bear weight afterwards.  She elevated the ankle and put ice on it and with minimal improvement.  With increasing swelling and continued pain she came to the ED for evaluation.  She denies other injury in the fall.  Denies knee pain.  The history is provided by the patient.  Foot Injury Location:  Ankle Time since incident:  2 hours Injury: yes   Mechanism of injury: fall   Fall:    Fall occurred:  Down stairs   Height of fall:  1 step   Entrapped after fall: no   Ankle location:  L ankle Pain details:    Quality:  Aching   Radiates to:  Does not radiate   Severity:  Moderate   Onset quality:  Gradual   Duration:  2 hours   Timing:  Constant   Progression:  Worsening Chronicity:  New Dislocation: no   Prior injury to area:  No Relieved by:  Nothing Worsened by:  Bearing weight Ineffective treatments:  None tried Associated symptoms: no fever        Past Medical History:  Diagnosis Date  . ALLERGIC RHINITIS   . Allergy   . ANEMIA-IRON DEFICIENCY   . Anxiety state, unspecified   . Carpal tunnel syndrome    pt denies   . COLONIC POLYPS, HX OF 2007  . GERD   . GLAUCOMA   . GOITER, MULTINODULAR    on [place] 2006, unchanged 6/13 with nodules all <96mm  . HIATAL HERNIA WITH REFLUX   . Hip dysplasia, acquired    B sx; eval at [facility] for same 01/2015 -ongoing PT  . OSTEOARTHRITIS   . TOBACCO USE, QUIT     Patient Active Problem List   Diagnosis Date Noted  . Routine general medical examination at a health care facility 07/22/2015  . Hip dysplasia,  acquired   . Bladder prolapse, female, acquired 01/09/2012  . MDD (major depressive disorder), single episode, mild ([place]) 05/29/2010  . GLAUCOMA 12/14/2009  . Iron deficiency anemia 05/21/2007  . GERD (gastroesophageal reflux disease) 05/21/2007  . Osteoarthritis 05/21/2007    Past Surgical History:  Procedure Laterality Date  . BREAST SURGERY  1960   Left breast cyst removed no complications  . COLONOSCOPY  2005  . EYE SURGERY  1991-1992   both eyes  . EYE SURGERY Left 2014   shunt behind left eye  . Right shoulder aurgery  10/2008     OB History   No obstetric history on file.     Family History  Problem Relation Age of Onset  . Diabetes Mother   . Heart disease Mother   . Arthritis Mother   . Hypertension Mother   . Arthritis Father   . Colon cancer Paternal Grandmother     Social History   Tobacco Use  . Smoking status: Former Smoker    Quit date: 08/12/1988    Years since quitting: 31.0  . Smokeless tobacco: Never Used  Substance Use Topics  .  Alcohol use: Yes    Alcohol/week: 0.0 standard drinks    Comment: occassionally  . Drug use: No    Home Medications Prior to Admission medications   Medication Sig Start Date End Date Taking? Authorizing Provider  acetaminophen (TYLENOL ARTHRITIS PAIN) 650 MG CR tablet Take 1,300 mg by mouth 2 (two) times daily.     [provider]  b complex vitamins tablet Take 1 tablet by mouth daily.      [provider]  busPIRone (BUSPAR) 5 MG tablet Take 1 tablet (5 mg total) by mouth 2 (two) times daily as needed. 08/25/18   Hoyt Koch, MD  gabapentin (NEURONTIN) 100 MG capsule Take 2 capsules (200 mg total) by mouth at bedtime. Patient taking differently: 200 mg. Take two 300 mg tablets by mouth daily. 03/03/15   Rowe Clack, MD  omeprazole (PRILOSEC) 20 MG capsule Take 1 capsule (20 mg total) by mouth daily. 09/10/18   Hoyt Koch, MD  sertraline (ZOLOFT) 100 MG tablet Take 1  tablet (100 mg total) by mouth daily. 09/10/18   Hoyt Koch, MD    Allergies    Patient has no known allergies.  Review of Systems   Review of Systems  Constitutional: Negative for chills and fever.  HENT: Negative for congestion and rhinorrhea.   Eyes: Negative for redness and visual disturbance.  Respiratory: Negative for shortness of breath and wheezing.   Cardiovascular: Negative for chest pain and palpitations.  Gastrointestinal: Negative for nausea and vomiting.  Genitourinary: Negative for dysuria and urgency.  Musculoskeletal: Positive for arthralgias. Negative for myalgias.  Skin: Negative for pallor and wound.  Neurological: Negative for dizziness and headaches.    Physical Exam Updated Vital Signs BP (!) 178/65 (BP Location: Left Arm)   Pulse 75   Temp 98.7 F (37.1 C) (Oral)   Resp 18   Ht 5\' 4"  (1.626 m)   Wt 62.6 kg   SpO2 98%   BMI 23.69 kg/m   Physical Exam Vitals and nursing note reviewed.  Constitutional:      General: She is not in acute distress.    Appearance: She is well-developed. She is not diaphoretic.  HENT:     Head: Normocephalic and atraumatic.  Eyes:     Pupils: Pupils are equal, round, and reactive to light.  Cardiovascular:     Rate and Rhythm: Normal rate and regular rhythm.     Heart sounds: No murmur. No friction rub. No gallop.   Pulmonary:     Effort: Pulmonary effort is normal.     Breath sounds: No wheezing or rales.  Abdominal:     General: There is no distension.     Palpations: Abdomen is soft.     Tenderness: There is no abdominal tenderness.  Musculoskeletal:        General: Swelling and tenderness present.     Cervical back: Normal range of motion and neck supple.     Comments: Pain and swelling to the L ankle.  Abrasion to the medial aspect.   PMS intact.  No pain at the fibular neck.   Skin:    General: Skin is warm and dry.  Neurological:     Mental Status: She is alert and oriented to person, place,  and time.  Psychiatric:        Behavior: Behavior normal.     ED Results / Procedures / Treatments   Labs (all labs ordered are listed, but only abnormal results  are displayed) Labs Reviewed - No data to display  EKG None  Radiology No results found.  Procedures .Joint Aspiration/Arthrocentesis  Date/Time: 08/20/2019 3:10 PM Performed by: Deno Etienne, DO Authorized by: Deno Etienne, DO   Consent:    Consent obtained:  Verbal   Consent given by:  Patient   Risks discussed:  Bleeding, nerve damage, incomplete drainage and infection   Alternatives discussed:  No treatment, delayed treatment, alternative treatment and observation Location:    Location:  Ankle   Ankle:  L ankle Anesthesia (see MAR for exact dosages):    Anesthesia method:  Local infiltration   Local anesthetic:  Lidocaine 2% WITH epi Procedure details:    Needle gauge: 21G.   Approach:  Medial   Aspirate amount:  0   Aspirate characteristics:  Blood-tinged   Steroid injected: no     Specimen collected: no   Post-procedure details:    Dressing:  Adhesive bandage   Patient tolerance of procedure:  Tolerated well, no immediate complications   (including critical care time)  Medications Ordered in ED Medications  acetaminophen (TYLENOL) tablet 1,000 mg (has no administration in time range)  oxyCODONE (Oxy IR/ROXICODONE) immediate release tablet 5 mg (has no administration in time range)    ED Course  I have reviewed the triage vital signs and the nursing notes.  Pertinent labs & imaging results that were available during my care of the patient were reviewed by me and considered in my medical decision making (see chart for details).    MDM Rules/Calculators/A&P                      79 yo F with a cc of left ankle pain.  Sounds mechanical in nature.  With the amount of edema and pain I would suspect that the patient has a bimalleolar fracture.  Will obtain a plain film.  Plain film viewed by me with  weber b fracture with disruption of the motise.  Plan to reduce, splint.  Ortho follow up.  Unfortunately lives at home alone, will try and get family to come help.   Delay getting ortho tech as not in house, signed out to Dr. Leonette Monarch, please see his note for further details of care in the ED.   The patients results and plan were reviewed and discussed.   Any x-rays performed were independently reviewed by myself.   Differential diagnosis were considered with the presenting HPI.  Medications  oxyCODONE (Oxy IR/ROXICODONE) immediate release tablet 5 mg (5 mg Oral Given 08/20/19 0018)  lidocaine-EPINEPHrine (XYLOCAINE W/EPI) 2 %-1:200000 (PF) injection 10 mL (10 mLs Intradermal Given by Other 08/20/19 0055)  fentaNYL (SUBLIMAZE) injection 100 mcg (100 mcg Intramuscular Given 08/20/19 0100)  lidocaine-EPINEPHrine (XYLOCAINE W/EPI) 2 %-1:100000 (with pres) injection (1 mL  Given 08/20/19 0100)    Vitals:   08/20/19 0006 08/20/19 0235  BP: (!) 178/65 129/64  Pulse: 75 74  Resp: 18 18  Temp: 98.7 F (37.1 C)   TempSrc: Oral   SpO2: 98% 96%  Weight: 62.6 kg   Height: 5\' 4"  (1.626 m)     Final diagnoses:  Closed fracture dislocation of left ankle, initial encounter      Final Clinical Impression(s) / ED Diagnoses Final diagnoses:  None    Rx / DC Orders ED Discharge Orders    None       Deno Etienne, DO 08/20/19 1511

## 2019-08-20 NOTE — ED Triage Notes (Signed)
Pt slipped on a step and rolled her L ankle. Bruising and swelling noted. Unable to bear weight.

## 2019-08-20 NOTE — Discharge Instructions (Addendum)
  Also take tylenol 1000mg (2 extra strength) four times a day.   Then take the pain medicine if you feel like you need it. Narcotics do not help with the pain, they only make you care about it less.  You can become addicted to this, people may break into your house to steal it.  It will constipate you.  If you drive under the influence of this medicine you can get a DUI.

## 2019-08-20 NOTE — ED Provider Notes (Signed)
I assumed care of this patient from Dr. Tyrone Nine.  Please see their note for further details of Hx, PE.  Briefly patient is a 79 y.o. female who presented left ankle pain s/p fall. plain film with  Fracture/dislocation.  Splint pending.  Placed.  Marland KitchenSplint Application  Date/Time: 08/20/2019 2:26 AM Performed by: Fatima Blank, MD Authorized by: Fatima Blank, MD   Consent:    Consent obtained:  Verbal   Consent given by:  Patient Pre-procedure details:    Sensation:  Normal Procedure details:    Laterality:  Left   Location:  Ankle   Ankle:  L ankle   Cast type:  Short leg   Supplies:  Ortho-Glass Post-procedure details:    Pain:  Improved   Sensation:  Normal   Patient tolerance of procedure:  Tolerated well, no immediate complications   Transition care consulted for walker. Won't be here til the am. Patient requesting to go home. Nephew is here to assist her at home.  The patient is safe for discharge with strict return precautions.   The patient appears reasonably screened and/or stabilized for discharge and I doubt any other medical condition or other [facility] requiring further screening, evaluation, or treatment in the ED at this time prior to discharge.  Disposition: Discharge  Condition: Good  I have discussed the results, Dx and Tx plan with the patient who expressed understanding and agree(s) with the plan. Discharge instructions discussed at great length. The patient was given strict return precautions who verbalized understanding of the instructions. No further questions at time of discharge.     Follow Up: [address] [place] [address] [phone]  Call today discuss your visit, see when they want to see you in the office         Breeona Waid, Grayce Sessions, MD 08/20/19 [phone]

## 2019-08-23 ENCOUNTER — Other Ambulatory Visit (HOSPITAL_COMMUNITY)
Admission: RE | Admit: 2019-08-23 | Discharge: 2019-08-23 | Disposition: A | Discharge status: Home / Self Care | Payer: PPO | Source: Ambulatory Visit | Attending: Orthopedic Surgery | Admitting: Orthopedic Surgery

## 2019-08-23 ENCOUNTER — Encounter (HOSPITAL_BASED_OUTPATIENT_CLINIC_OR_DEPARTMENT_OTHER): Payer: Self-pay | Admitting: Orthopedic Surgery

## 2019-08-23 ENCOUNTER — Other Ambulatory Visit: Payer: Self-pay

## 2019-08-23 DIAGNOSIS — Z20822 Contact with and (suspected) exposure to covid-19: Secondary | ICD-10-CM | POA: Insufficient documentation

## 2019-08-23 DIAGNOSIS — Z01812 Encounter for preprocedural laboratory examination: Secondary | ICD-10-CM | POA: Insufficient documentation

## 2019-08-23 DIAGNOSIS — S9305XA Dislocation of left ankle joint, initial encounter: Secondary | ICD-10-CM | POA: Diagnosis not present

## 2019-08-23 LAB — SARS CORONAVIRUS 2 (TAT 6-24 HRS): SARS Coronavirus 2: NEGATIVE

## 2019-08-24 ENCOUNTER — Encounter (HOSPITAL_BASED_OUTPATIENT_CLINIC_OR_DEPARTMENT_OTHER)
Admission: RE | Disposition: A | Discharge status: Home / Self Care | Payer: Self-pay | Source: Home / Self Care | Attending: Orthopedic Surgery

## 2019-08-24 ENCOUNTER — Ambulatory Visit (HOSPITAL_BASED_OUTPATIENT_CLINIC_OR_DEPARTMENT_OTHER): Payer: PPO | Admitting: Anesthesiology

## 2019-08-24 ENCOUNTER — Encounter (HOSPITAL_BASED_OUTPATIENT_CLINIC_OR_DEPARTMENT_OTHER): Payer: Self-pay | Admitting: Orthopedic Surgery

## 2019-08-24 ENCOUNTER — Ambulatory Visit (HOSPITAL_BASED_OUTPATIENT_CLINIC_OR_DEPARTMENT_OTHER)
Admission: RE | Admit: 2019-08-24 | Discharge: 2019-08-24 | Disposition: A | Discharge status: Home / Self Care | Payer: PPO | Attending: Orthopedic Surgery | Admitting: Orthopedic Surgery

## 2019-08-24 ENCOUNTER — Other Ambulatory Visit: Payer: Self-pay

## 2019-08-24 DIAGNOSIS — Z87891 Personal history of nicotine dependence: Secondary | ICD-10-CM | POA: Diagnosis not present

## 2019-08-24 DIAGNOSIS — S8262XA Displaced fracture of lateral malleolus of left fibula, initial encounter for closed fracture: Secondary | ICD-10-CM | POA: Diagnosis not present

## 2019-08-24 DIAGNOSIS — S93432A Sprain of tibiofibular ligament of left ankle, initial encounter: Secondary | ICD-10-CM | POA: Diagnosis not present

## 2019-08-24 DIAGNOSIS — X58XXXA Exposure to other specified factors, initial encounter: Secondary | ICD-10-CM | POA: Insufficient documentation

## 2019-08-24 DIAGNOSIS — S82402A Unspecified fracture of shaft of left fibula, initial encounter for closed fracture: Secondary | ICD-10-CM | POA: Diagnosis present

## 2019-08-24 DIAGNOSIS — Z79899 Other long term (current) drug therapy: Secondary | ICD-10-CM | POA: Insufficient documentation

## 2019-08-24 DIAGNOSIS — K219 Gastro-esophageal reflux disease without esophagitis: Secondary | ICD-10-CM | POA: Diagnosis not present

## 2019-08-24 DIAGNOSIS — S82842A Displaced bimalleolar fracture of left lower leg, initial encounter for closed fracture: Secondary | ICD-10-CM | POA: Insufficient documentation

## 2019-08-24 DIAGNOSIS — F419 Anxiety disorder, unspecified: Secondary | ICD-10-CM | POA: Diagnosis not present

## 2019-08-24 DIAGNOSIS — G8918 Other acute postprocedural pain: Secondary | ICD-10-CM | POA: Diagnosis not present

## 2019-08-24 DIAGNOSIS — H409 Unspecified glaucoma: Secondary | ICD-10-CM | POA: Insufficient documentation

## 2019-08-24 DIAGNOSIS — D509 Iron deficiency anemia, unspecified: Secondary | ICD-10-CM | POA: Diagnosis not present

## 2019-08-24 DIAGNOSIS — F329 Major depressive disorder, single episode, unspecified: Secondary | ICD-10-CM | POA: Diagnosis not present

## 2019-08-24 HISTORY — DX: Other fracture of left lower leg, initial encounter for closed fracture: S82.892A

## 2019-08-24 HISTORY — PX: ORIF FIBULA FRACTURE: SHX5114

## 2019-08-24 SURGERY — OPEN REDUCTION INTERNAL FIXATION (ORIF) FIBULA FRACTURE
Anesthesia: General | Site: Ankle | Laterality: Left

## 2019-08-24 MED ORDER — PROPOFOL 10 MG/ML IV BOLUS
INTRAVENOUS | Status: AC
  Filled 2019-08-24: qty 20

## 2019-08-24 MED ORDER — ACETAMINOPHEN 500 MG PO TABS
ORAL_TABLET | ORAL | Status: AC
  Filled 2019-08-24: qty 2

## 2019-08-24 MED ORDER — ONDANSETRON HCL 4 MG/2ML IJ SOLN
INTRAMUSCULAR | Status: DC | PRN
  Administered 2019-08-24: 4 mg via INTRAVENOUS

## 2019-08-24 MED ORDER — CHLORHEXIDINE GLUCONATE 4 % EX LIQD: 60.0000 mL | Freq: Once | CUTANEOUS | Status: DC

## 2019-08-24 MED ORDER — MIDAZOLAM HCL 2 MG/2ML IJ SOLN
INTRAMUSCULAR | Status: AC
  Filled 2019-08-24: qty 2

## 2019-08-24 MED ORDER — ONDANSETRON HCL 4 MG/2ML IJ SOLN
INTRAMUSCULAR | Status: AC
  Filled 2019-08-24: qty 2

## 2019-08-24 MED ORDER — DEXAMETHASONE SODIUM PHOSPHATE 10 MG/ML IJ SOLN
INTRAMUSCULAR | Status: AC
  Filled 2019-08-24: qty 2

## 2019-08-24 MED ORDER — EPHEDRINE SULFATE-NACL 50-0.9 MG/10ML-% IV SOSY
PREFILLED_SYRINGE | INTRAVENOUS | Status: DC | PRN
  Administered 2019-08-24 (×3): 10 mg via INTRAVENOUS

## 2019-08-24 MED ORDER — ACETAMINOPHEN 160 MG/5ML PO SOLN: 325.0000 mg | ORAL | Status: DC | PRN

## 2019-08-24 MED ORDER — LACTATED RINGERS IV SOLN: INTRAVENOUS | Status: DC

## 2019-08-24 MED ORDER — FENTANYL CITRATE (PF) 100 MCG/2ML IJ SOLN
INTRAMUSCULAR | Status: AC
  Filled 2019-08-24: qty 2

## 2019-08-24 MED ORDER — EPHEDRINE 5 MG/ML INJ
INTRAVENOUS | Status: AC
  Filled 2019-08-24: qty 10

## 2019-08-24 MED ORDER — DEXMEDETOMIDINE HCL IN NACL 200 MCG/50ML IV SOLN
INTRAVENOUS | Status: AC
  Filled 2019-08-24: qty 50

## 2019-08-24 MED ORDER — PROPOFOL 10 MG/ML IV BOLUS
INTRAVENOUS | Status: DC | PRN
  Administered 2019-08-24: 130 mg via INTRAVENOUS

## 2019-08-24 MED ORDER — MEPERIDINE HCL 25 MG/ML IJ SOLN: 6.2500 mg | INTRAMUSCULAR | Status: DC | PRN

## 2019-08-24 MED ORDER — LIDOCAINE 2% (20 MG/ML) 5 ML SYRINGE
INTRAMUSCULAR | Status: DC | PRN
  Administered 2019-08-24: 60 mg via INTRAVENOUS

## 2019-08-24 MED ORDER — TRAMADOL HCL 50 MG PO TABS: 50.0000 mg | ORAL_TABLET | Freq: Four times a day (QID) | ORAL | 0 refills | Status: DC | PRN

## 2019-08-24 MED ORDER — CEFAZOLIN SODIUM-DEXTROSE 2-4 GM/100ML-% IV SOLN
2.0000 g | INTRAVENOUS | Status: AC
  Administered 2019-08-24: 2 g via INTRAVENOUS

## 2019-08-24 MED ORDER — MIDAZOLAM HCL 2 MG/2ML IJ SOLN: 1.0000 mg | INTRAMUSCULAR | Status: DC | PRN

## 2019-08-24 MED ORDER — DEXAMETHASONE SODIUM PHOSPHATE 10 MG/ML IJ SOLN
INTRAMUSCULAR | Status: DC | PRN
  Administered 2019-08-24: 5 mg via INTRAVENOUS

## 2019-08-24 MED ORDER — OXYCODONE HCL 5 MG PO TABS: 5.0000 mg | ORAL_TABLET | Freq: Once | ORAL | Status: DC | PRN

## 2019-08-24 MED ORDER — FENTANYL CITRATE (PF) 100 MCG/2ML IJ SOLN: 25.0000 ug | INTRAMUSCULAR | Status: DC | PRN

## 2019-08-24 MED ORDER — BUPIVACAINE LIPOSOME 1.3 % IJ SUSP
INTRAMUSCULAR | Status: DC | PRN
  Administered 2019-08-24: 10 mL

## 2019-08-24 MED ORDER — LIDOCAINE 2% (20 MG/ML) 5 ML SYRINGE
INTRAMUSCULAR | Status: AC
  Filled 2019-08-24: qty 5

## 2019-08-24 MED ORDER — FENTANYL CITRATE (PF) 100 MCG/2ML IJ SOLN
50.0000 ug | INTRAMUSCULAR | Status: DC | PRN
  Administered 2019-08-24: 12:00:00 100 ug via INTRAVENOUS

## 2019-08-24 MED ORDER — ACETAMINOPHEN 325 MG PO TABS: 325.0000 mg | ORAL_TABLET | ORAL | Status: DC | PRN

## 2019-08-24 MED ORDER — ONDANSETRON HCL 4 MG/2ML IJ SOLN: 4.0000 mg | Freq: Once | INTRAMUSCULAR | Status: DC | PRN

## 2019-08-24 MED ORDER — CEFAZOLIN SODIUM-DEXTROSE 2-4 GM/100ML-% IV SOLN
INTRAVENOUS | Status: AC
  Filled 2019-08-24: qty 100

## 2019-08-24 MED ORDER — OXYCODONE HCL 5 MG/5ML PO SOLN: 5.0000 mg | Freq: Once | ORAL | Status: DC | PRN

## 2019-08-24 MED ORDER — BUPIVACAINE-EPINEPHRINE (PF) 0.25% -1:200000 IJ SOLN
INTRAMUSCULAR | Status: DC | PRN
  Administered 2019-08-24: 20 mL

## 2019-08-24 MED ORDER — ACETAMINOPHEN 500 MG PO TABS
1000.0000 mg | ORAL_TABLET | Freq: Once | ORAL | Status: AC
  Administered 2019-08-24: 1000 mg via ORAL

## 2019-08-24 SURGICAL SUPPLY — 92 items
BANDAGE ESMARK 6X9 LF (GAUZE/BANDAGES/DRESSINGS) ×1 IMPLANT
BIT DRILL 110X2.5XQCK CNCT (BIT) IMPLANT
BIT DRILL 2.5 (BIT) ×3
BIT DRILL 2.7 (BIT) ×1
BIT DRILL 2.7MM (BIT) IMPLANT
BIT DRILL QC 110 3.5 (BIT) ×1
BIT DRILL QC 110 3.5MM (BIT) IMPLANT
BIT DRILL STD 2.0MM (DRILL) IMPLANT
BIT DRL 110X2.5XQCK CNCT (BIT) ×1
BLADE SURG 15 STRL LF DISP TIS (BLADE) ×2 IMPLANT
BLADE SURG 15 STRL SS (BLADE) ×6
BNDG CMPR 9X6 STRL LF SNTH (GAUZE/BANDAGES/DRESSINGS) ×1
BNDG COHESIVE 4X5 TAN STRL (GAUZE/BANDAGES/DRESSINGS) ×3 IMPLANT
BNDG ELASTIC 4X5.8 VLCR STR LF (GAUZE/BANDAGES/DRESSINGS) ×3 IMPLANT
BNDG ELASTIC 6X5.8 VLCR STR LF (GAUZE/BANDAGES/DRESSINGS) ×2 IMPLANT
BNDG ESMARK 6X9 LF (GAUZE/BANDAGES/DRESSINGS) ×3
CANISTER SUCT 1200ML W/VALVE (MISCELLANEOUS) ×3 IMPLANT
CLOSURE STERI-STRIP 1/2X4 (GAUZE/BANDAGES/DRESSINGS) ×1
CLSR STERI-STRIP ANTIMIC 1/2X4 (GAUZE/BANDAGES/DRESSINGS) ×1 IMPLANT
COVER WAND RF STERILE (DRAPES) IMPLANT
CUFF TOURN SGL QUICK 24 (TOURNIQUET CUFF) ×3
CUFF TOURN SGL QUICK 34 (TOURNIQUET CUFF)
CUFF TRNQT CYL 24X4X16.5-23 (TOURNIQUET CUFF) IMPLANT
CUFF TRNQT CYL 34X4.125X (TOURNIQUET CUFF) IMPLANT
DECANTER SPIKE VIAL GLASS SM (MISCELLANEOUS) IMPLANT
DRAPE EXTREMITY T 121X128X90 (DISPOSABLE) ×3 IMPLANT
DRAPE INCISE IOBAN 66X45 STRL (DRAPES) IMPLANT
DRAPE OEC MINIVIEW 54X84 (DRAPES) ×3 IMPLANT
DRAPE U-SHAPE 47X51 STRL (DRAPES) ×3 IMPLANT
DRILL BIT 2.7MM (BIT) ×3
DRILL BIT QC 110 3.5MM (BIT) ×3
DRILL STANDARD 2.0MM (DRILL) ×3
DRSG PAD ABDOMINAL 8X10 ST (GAUZE/BANDAGES/DRESSINGS) ×3 IMPLANT
DRSG TEGADERM 2-3/8X2-3/4 SM (GAUZE/BANDAGES/DRESSINGS) ×4 IMPLANT
DRSG TEGADERM 4X4.75 (GAUZE/BANDAGES/DRESSINGS) ×2 IMPLANT
DURAPREP 26ML APPLICATOR (WOUND CARE) ×3 IMPLANT
ELECT REM PT RETURN 9FT ADLT (ELECTROSURGICAL) ×3
ELECTRODE REM PT RTRN 9FT ADLT (ELECTROSURGICAL) ×1 IMPLANT
GAUZE SPONGE 4X4 12PLY STRL (GAUZE/BANDAGES/DRESSINGS) ×3 IMPLANT
GLOVE BIO SURGEON STRL SZ7 (GLOVE) ×3 IMPLANT
GLOVE BIOGEL PI IND STRL 7.0 (GLOVE) ×1 IMPLANT
GLOVE BIOGEL PI IND STRL 8 (GLOVE) ×2 IMPLANT
GLOVE BIOGEL PI INDICATOR 7.0 (GLOVE) ×4
GLOVE BIOGEL PI INDICATOR 8 (GLOVE) ×4
GLOVE ECLIPSE 6.5 STRL STRAW (GLOVE) ×2 IMPLANT
GLOVE EXAM NITRILE MD LF STRL (GLOVE) ×2 IMPLANT
GLOVE ORTHO TXT STRL SZ7.5 (GLOVE) ×3 IMPLANT
GOWN STRL REUS W/ TWL LRG LVL3 (GOWN DISPOSABLE) ×1 IMPLANT
GOWN STRL REUS W/ TWL XL LVL3 (GOWN DISPOSABLE) ×2 IMPLANT
GOWN STRL REUS W/TWL LRG LVL3 (GOWN DISPOSABLE) ×6
GOWN STRL REUS W/TWL XL LVL3 (GOWN DISPOSABLE) ×6
NS IRRIG 1000ML POUR BTL (IV SOLUTION) ×3 IMPLANT
PACK ARTHROSCOPY DSU (CUSTOM PROCEDURE TRAY) ×3 IMPLANT
PACK BASIN DAY SURGERY FS (CUSTOM PROCEDURE TRAY) ×3 IMPLANT
PAD CAST 4YDX4 CTTN HI CHSV (CAST SUPPLIES) ×1 IMPLANT
PADDING CAST ABS 4INX4YD NS (CAST SUPPLIES) ×2
PADDING CAST ABS COTTON 4X4 ST (CAST SUPPLIES) IMPLANT
PADDING CAST COTTON 4X4 STRL (CAST SUPPLIES) ×3
PADDING CAST COTTON 6X4 STRL (CAST SUPPLIES) IMPLANT
PENCIL SMOKE EVACUATOR (MISCELLANEOUS) ×3 IMPLANT
PLATE LOCK DIST FIB 2.7X80 LT (Plate) ×2 IMPLANT
SCREW 3.5X12 (Screw) ×3 IMPLANT
SCREW BN 2.7X12X3.5XST NS (Screw) IMPLANT
SCREW CORTICAL 3.5 18MM (Screw) ×2 IMPLANT
SCREW CORTICAL NONLOCK 3.5X50 (Screw) ×2 IMPLANT
SCREW LOCK 14X2.7X NS (Screw) IMPLANT
SCREW LOCK 16X2.7X (Screw) IMPLANT
SCREW LOCK 18X2.7XNS ELB (Screw) IMPLANT
SCREW LOCKING 2.7MMX18MM (Screw) ×6 IMPLANT
SCREW LOCKING 2.7X14 (Screw) ×3 IMPLANT
SCREW LOCKING 2.7X16 (Screw) ×3 IMPLANT
SCREW PERI 3.5X14MM W/2.7 (Screw) ×4 IMPLANT
SLEEVE SCD COMPRESS KNEE MED (MISCELLANEOUS) ×3 IMPLANT
SPLINT FAST PLASTER 5X30 (CAST SUPPLIES) ×40
SPLINT PLASTER CAST FAST 5X30 (CAST SUPPLIES) ×1 IMPLANT
SPONGE LAP 4X18 RFD (DISPOSABLE) ×5 IMPLANT
STAPLER VISISTAT 35W (STAPLE) IMPLANT
SUCTION FRAZIER HANDLE 10FR (MISCELLANEOUS) ×2
SUCTION TUBE FRAZIER 10FR DISP (MISCELLANEOUS) ×1 IMPLANT
SUT ETHILON 3 0 PS 1 (SUTURE) IMPLANT
SUT ETHILON 4 0 PS 2 18 (SUTURE) IMPLANT
SUT MNCRL AB 4-0 PS2 18 (SUTURE) IMPLANT
SUT VIC AB 0 SH 27 (SUTURE) ×3 IMPLANT
SUT VIC AB 2-0 SH 18 (SUTURE) IMPLANT
SUT VIC AB 2-0 SH 27 (SUTURE)
SUT VIC AB 2-0 SH 27XBRD (SUTURE) IMPLANT
SUT VICRYL 3-0 CR8 SH (SUTURE) ×3 IMPLANT
SUT VICRYL 4-0 PS2 18IN ABS (SUTURE) IMPLANT
SYR BULB 3OZ (MISCELLANEOUS) ×3 IMPLANT
TOWEL GREEN STERILE FF (TOWEL DISPOSABLE) ×3 IMPLANT
UNDERPAD 30X36 HEAVY ABSORB (UNDERPADS AND DIAPERS) ×3 IMPLANT
YANKAUER SUCT BULB TIP NO VENT (SUCTIONS) IMPLANT

## 2019-08-24 NOTE — Discharge Instructions (Signed)
Post Anesthesia Home Care Instructions  Activity: Get plenty of rest for the remainder of the day. A responsible individual must stay with you for 24 hours following the procedure.  For the next 24 hours, DO NOT: -Drive a car -Paediatric nurse -Drink alcoholic beverages -Take any medication unless instructed by your physician -Make any legal decisions or sign important papers.  Meals: Start with liquid foods such as gelatin or soup. Progress to regular foods as tolerated. Avoid greasy, spicy, heavy foods. If nausea and/or vomiting occur, drink only clear liquids until the nausea and/or vomiting subsides. Call your physician if vomiting continues.  Special Instructions/Symptoms: Your throat may feel dry or sore from the anesthesia or the breathing tube placed in your throat during surgery. If this causes discomfort, gargle with warm salt water. The discomfort should disappear within 24 hours.  If you had a scopolamine patch placed behind your ear for the management of post- operative nausea and/or vomiting:  1. The medication in the patch is effective for 72 hours, after which it should be removed.  Wrap patch in a tissue and discard in the trash. Wash hands thoroughly with soap and water. 2. You may remove the patch earlier than 72 hours if you experience unpleasant side effects which may include dry mouth, dizziness or visual disturbances. 3. Avoid touching the patch. Wash your hands with soap and water after contact with the patch.       Regional Anesthesia Blocks  1. Numbness or the inability to move the "blocked" extremity may last from 3-48 hours after placement. The length of time depends on the medication injected and your individual response to the medication. If the numbness is not going away after 48 hours, call your surgeon.  2. The extremity that is blocked will need to be protected until the numbness is gone and the  Strength has returned. Because you cannot feel it, you  will need to take extra care to avoid injury. Because it may be weak, you may have difficulty moving it or using it. You may not know what position it is in without looking at it while the block is in effect.  3. For blocks in the legs and feet, returning to weight bearing and walking needs to be done carefully. You will need to wait until the numbness is entirely gone and the strength has returned. You should be able to move your leg and foot normally before you try and bear weight or walk. You will need someone to be with you when you first try to ensure you do not fall and possibly risk injury.  4. Bruising and tenderness at the needle site are common side effects and will resolve in a few days.  5. Persistent numbness or new problems with movement should be communicated to the surgeon or the [place] [phone] [place] [phone]).   Information for Discharge Teaching: EXPAREL (bupivacaine liposome injectable suspension)   Your surgeon or anesthesiologist gave you EXPAREL(bupivacaine) to help control your pain after surgery.   EXPAREL is a local anesthetic that provides pain relief by numbing the tissue around the surgical site.  EXPAREL is designed to release pain medication over time and can control pain for up to 72 hours.  Depending on how you respond to EXPAREL, you may require less pain medication during your recovery.  Possible side effects:  Temporary loss of sensation or ability to move in the area where bupivacaine was injected.  Nausea, vomiting, constipation  Rarely, numbness and tingling in your mouth or lips, lightheadedness, or anxiety may occur.  Call your doctor right away if you think you may be experiencing any of these sensations, or if you have other questions regarding possible side effects.  Follow all other discharge instructions given to you by your surgeon or nurse. Eat a healthy diet and drink plenty of water or  other fluids.  If you return to the hospital for any reason within 96 hours following the administration of EXPAREL, it is important for health care providers to know that you have received this anesthetic. A teal colored band has been placed on your arm with the date, time and amount of EXPAREL you have received in order to alert and inform your health care providers. Please leave this armband in place for the full 96 hours following administration, and then you may remove the band.   Diet: As you were doing prior to hospitalization   Shower:  May shower but keep the wounds dry, use an occlusive plastic wrap, NO SOAKING IN TUB.  If the bandage gets wet, change with a clean dry gauze.  If you have a splint on, leave the splint in place and keep the splint dry with a plastic bag.  Dressing:  You may change your dressing 3-5 days after surgery, unless you have a splint.  If you have a splint, then just leave the splint in place and we will change your bandages during your first follow-up appointment.    If you had hand or foot surgery, we will plan to remove your stitches in about 2 weeks in the office.  For all other surgeries, there are sticky tapes (steri-strips) on your wounds and all the stitches are absorbable.  Leave the steri-strips in place when changing your dressings, they will peel off with time, usually 2-3 weeks.  Activity:  Increase activity slowly as tolerated, but follow the weight bearing instructions below.  The rules on driving is that you can not be taking narcotics while you drive, and you must feel in control of the vehicle.    Weight Bearing:  No weight bearing on left leg.  To prevent constipation: you may use a stool softener such as -  Colace (over the counter) 100 mg by mouth twice a day  Drink plenty of fluids (prune juice may be helpful) and high fiber foods Miralax (over the counter) for constipation as needed.    Itching:  If you experience itching with your  medications, try taking only a single pain pill, or even half a pain pill at a time.  You may take up to 10 pain pills per day, and you can also use benadryl over the counter for itching or also to help with sleep.   Precautions:  If you experience chest pain or shortness of breath - call 911 immediately for transfer to the hospital emergency department!!  If you develop a fever greater that 101 F, purulent drainage from wound, increased redness or drainage from wound, or calf pain -- Call the office at [phone]                                                Follow- Up Appointment:  Please call for an appointment to be seen in 2 weeks [place] - [phone]

## 2019-08-24 NOTE — Anesthesia Procedure Notes (Signed)
Procedure Name: LMA Insertion Date/Time: 08/24/2019 1:25 PM Performed by: Eulas Post, Lyrah Bradt W, CRNA Pre-anesthesia Checklist: Patient identified, Emergency Drugs available, Suction available and Patient being monitored Patient Re-evaluated:Patient Re-evaluated prior to induction Oxygen Delivery Method: Circle system utilized Preoxygenation: Pre-oxygenation with 100% oxygen Induction Type: IV induction Ventilation: Mask ventilation without difficulty LMA: LMA inserted LMA Size: 4.0 Number of attempts: 1 Airway Equipment and Method: Bite block Placement Confirmation: positive ETCO2 and breath sounds checked- equal and bilateral Tube secured with: Tape Dental Injury: Teeth and Oropharynx as per pre-operative assessment

## 2019-08-24 NOTE — Anesthesia Procedure Notes (Signed)
Anesthesia Regional Block: Popliteal block   Pre-Anesthetic Checklist: ,, timeout performed, Correct Patient, Correct Site, Correct Laterality, Correct Procedure, Correct Position, site marked, Risks and benefits discussed,  Surgical consent,  Pre-op evaluation,  At surgeon's request and post-op pain management  Laterality: Left  Prep: chloraprep       Needles:  Injection technique: Single-shot  Needle Type: Echogenic Stimulator Needle     Needle Length: 5cm  Needle Gauge: 22     Additional Needles:   Procedures:, nerve stimulator,,, ultrasound used (permanent image in chart),,,,   Nerve Stimulator or Paresthesia:  Response: foot, 0.45 mA,   Additional Responses:   Narrative:  Start time: 08/24/2019 12:30 PM End time: 08/24/2019 12:40 PM Injection made incrementally with aspirations every 5 mL.  Performed by: Personally  Anesthesiologist: Janeece Riggers, MD  Additional Notes: Functioning IV was confirmed and monitors were applied.  A 33mm 22ga Arrow echogenic stimulator needle was used. Sterile prep and drape,hand hygiene and sterile gloves were used. Ultrasound guidance: relevant anatomy identified, needle position confirmed, local anesthetic spread visualized around nerve(s)., vascular puncture avoided.  Image printed for medical record. Negative aspiration and negative test dose prior to incremental administration of local anesthetic. The patient tolerated the procedure well.

## 2019-08-24 NOTE — H&P (Signed)
PREOPERATIVE H&P  Chief Complaint: Left ankle pain  HPI: Linda [name] is a 79 y.o. female who presents for preoperative history and physical with a diagnosis of left distal fibula fracture with mortise disruption and significant medial widening. Symptoms are rated as moderate to severe, and have been worsening.  This is significantly impairing activities of daily living.  Linda [name] has elected for surgical management.   Injury happened about 5 days ago, Linda [name] was referred from the emergency room and seen yesterday, significant soft tissue swelling and displacement of the fracture.  Past Medical History:  Diagnosis Date  . ALLERGIC RHINITIS   . Allergy   . ANEMIA-IRON DEFICIENCY   . Anxiety state, unspecified   . Carpal tunnel syndrome    pt denies   . Closed left ankle fracture   . COLONIC POLYPS, HX OF 2007  . GERD   . GLAUCOMA   . GOITER, MULTINODULAR    on [place] 2006, unchanged 6/13 with nodules all <62mm  . HIATAL HERNIA WITH REFLUX   . Hip dysplasia, acquired    B sx; eval at [facility] for same 01/2015 -ongoing PT  . OSTEOARTHRITIS   . TOBACCO USE, QUIT    Past Surgical History:  Procedure Laterality Date  . BREAST SURGERY  1960   Left breast cyst removed no complications  . CARPAL TUNNEL RELEASE Right   . COLONOSCOPY  2005  . EYE SURGERY  1991-1992   both eyes  . EYE SURGERY Left 2014   shunt behind left eye  . Right shoulder aurgery  10/2008   Social History   Socioeconomic History  . Marital status: Widowed    Spouse name: Not on file  . Number of children: 2  . Years of education: Not on file  . Highest education level: Not on file  Occupational History  . Not on file  Tobacco Use  . Smoking status: Former Smoker    Quit date: 08/12/1988    Years since quitting: 31.0  . Smokeless tobacco: Never Used  Substance and Sexual Activity  . Alcohol use: Yes    Alcohol/week: 0.0 standard drinks    Comment: occassionally  . Drug use: No  . Sexual activity: Not Currently    Birth control/protection: Post-menopausal  Other Topics Concern  . 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 Difficulty of Paying Living Expenses: Not on file  Food Insecurity:   . Worried About Charity fundraiser in the Last Year: Not on file  . Ran Out of Food in the Last Year: Not on file  Transportation Needs:   . Lack of Transportation (Medical): Not on file  . Lack of Transportation (Non-Medical): Not on file  Physical Activity:   . Days of Exercise per Week: Not on file  . Minutes of Exercise per Session: Not on file  Stress: No Stress Concern Present  . Feeling of Stress : Only a little  Social Connections:   . Frequency of Communication with Friends and Family: Not on file  . Frequency of Social Gatherings with Friends and Family: Not on file  . Attends Religious Services: Not on file  . Active Member of Clubs or Organizations: Not on file  . Attends Archivist Meetings: Not on file  . Marital Status: Not on file   Family History  Problem Relation Age of Onset  .  Diabetes Mother   . Heart disease Mother   . Arthritis Mother   . Hypertension Mother   . Arthritis Father   . Colon cancer Paternal Grandmother    No Known Allergies Prior to Admission medications   Medication Sig Start Date End Date Taking? Authorizing Provider  acetaminophen (TYLENOL ARTHRITIS PAIN) 650 MG CR tablet Take 1,300 mg by mouth 2 (two) times daily.    Yes [provider]  b complex vitamins tablet Take 1 tablet by mouth daily.     Yes [provider]  busPIRone (BUSPAR) 5 MG tablet Take 1 tablet (5 mg total) by mouth 2 (two) times daily as needed. 08/25/18  Yes Hoyt Koch, MD  dorzolamide-timolol (COSOPT) 22.3-6.8 MG/ML ophthalmic solution Place 1 drop into both eyes 2 (two) times daily.   Yes [provider]  gabapentin (NEURONTIN) 300 MG  capsule Take 600 mg by mouth at bedtime.   Yes [provider]  morphine (MSIR) 15 MG tablet Take 1 tablet (15 mg total) by mouth every 4 (four) hours as needed for severe pain. 08/20/19  Yes Deno Etienne, DO  omeprazole (PRILOSEC) 20 MG capsule Take 1 capsule (20 mg total) by mouth daily. 09/10/18  Yes Hoyt Koch, MD  sertraline (ZOLOFT) 100 MG tablet Take 1 tablet (100 mg total) by mouth daily. 09/10/18  Yes Hoyt Koch, MD  traMADol (ULTRAM) 50 MG tablet Take 50 mg by mouth every 6 (six) hours as needed.   Yes [provider]     Positive ROS: All other systems have been reviewed and were otherwise negative with the exception of those mentioned in the HPI and as above.  Physical Exam: General: Alert, no acute distress Cardiovascular: No pedal edema Respiratory: No cyanosis, no use of accessory musculature GI: No organomegaly, abdomen is soft and non-tender Skin: Linda [name] has fracture blisters located medially, with a small area of skin necrosis anteromedially with eschar formation. Neurologic: Sensation intact distally Psychiatric: Patient is competent for consent with normal mood and affect Lymphatic: No axillary or cervical lymphadenopathy  MUSCULOSKELETAL: Left foot has sensation intact throughout, EHL and FHL are intact, positive pain to palpation both medially and laterally.  Assessment: Left distal fibula fracture with significant lateral subluxation, with medial skin tenting, with an area already evident with skin necrosis medially.   Plan: Plan for Procedure(s): OPEN REDUCTION INTERNAL FIXATION (ORIF) LEFT DISTAL FIBULA FRACTURE possible external fixation  The risks benefits and alternatives were discussed with the patient including but not limited to the risks of nonoperative treatment, versus surgical intervention including infection, bleeding, nerve injury, malunion, nonunion, the need for revision surgery, hardware prominence, hardware failure,  the need for hardware removal, blood clots, cardiopulmonary complications, morbidity, mortality, among others, and they were willing to proceed.      Johnny Bridge, MD Cell [phone]   08/24/2019 1:10 PM

## 2019-08-24 NOTE — Anesthesia Preprocedure Evaluation (Signed)
Anesthesia Evaluation  Patient identified by MRN, date of birth, ID band Patient awake    Reviewed: Allergy & Precautions, H&P , NPO status , Patient's Chart, lab work & pertinent test results, reviewed documented beta blocker date and time   Airway Mallampati: II  TM Distance: >3 FB Neck ROM: full    Dental no notable dental hx.    Pulmonary neg pulmonary ROS, former smoker,    Pulmonary exam normal breath sounds clear to auscultation       Cardiovascular Exercise Tolerance: Good negative cardio ROS   Rhythm:regular Rate:Normal     Neuro/Psych PSYCHIATRIC DISORDERS Anxiety Depression negative neurological ROS     GI/Hepatic Neg liver ROS, hiatal hernia, GERD  Medicated,  Endo/Other  negative endocrine ROS  Renal/GU negative Renal ROS  negative genitourinary   Musculoskeletal  (+) Arthritis ,   Abdominal   Peds  Hematology  (+) Blood dyscrasia, anemia ,   Anesthesia Other Findings   Reproductive/Obstetrics negative OB ROS                             Anesthesia Physical Anesthesia Plan  ASA: II  Anesthesia Plan: General   Post-op Pain Management: GA combined w/ Regional for post-op pain   Induction:   PONV Risk Score and Plan: 3 and Ondansetron, Treatment may vary due to age or medical condition and Dexamethasone  Airway Management Planned: LMA  Additional Equipment:   Intra-op Plan:   Post-operative Plan: Extubation in OR  Informed Consent: I have reviewed the patients History and Physical, chart, labs and discussed the procedure including the risks, benefits and alternatives for the proposed anesthesia with the patient or authorized representative who has indicated his/her understanding and acceptance.     Dental Advisory Given  Plan Discussed with: CRNA, Anesthesiologist and Surgeon  Anesthesia Plan Comments: (Discussed both nerve block for pain relief post-op and  GA; including NV, sore throat, dental injury, and pulmonary complications)        Anesthesia Quick Evaluation

## 2019-08-24 NOTE — Transfer of Care (Signed)
Immediate Anesthesia Transfer of Care Note  Patient: Linda [name]  Procedure(s) Performed: OPEN REDUCTION INTERNAL FIXATION (ORIF) LEFT DISTAL FIBULA FRACTURE (Left Ankle)  Patient Location: PACU  Anesthesia Type:GA combined with regional for post-op pain  Level of Consciousness: sedated  Airway & Oxygen Therapy: Patient Spontanous Breathing and Patient connected to face mask oxygen  Post-op Assessment: Report given to RN and Post -op Vital signs reviewed and stable  Post vital signs: Reviewed and stable  Last Vitals:  Vitals Value Taken Time  BP 152/68 08/24/19 1458  Temp    Pulse 78 08/24/19 1500  Resp 15 08/24/19 1500  SpO2 100 % 08/24/19 1500  Vitals shown include unvalidated device data.  Last Pain:  Vitals:   08/24/19 1134  PainSc: 1          Complications: No apparent anesthesia complications

## 2019-08-24 NOTE — Progress Notes (Signed)
Assisted Dr. Oddono with left, ultrasound guided, popliteal block. Side rails up, monitors on throughout procedure. See vital signs in flow sheet. Tolerated Procedure well. 

## 2019-08-24 NOTE — Op Note (Signed)
08/24/2019  PATIENT:  Linda [name]    PRE-OPERATIVE DIAGNOSIS: Left bimalleolar ankle fracture with significant medial clear space widening  POST-OPERATIVE DIAGNOSIS: Left bimalleolar ankle fracture with medial clear space widening and suggestion of syndesmotic disruption  PROCEDURE:    1.  Open Reduction Internal Fixation bimalleolar ankle fracture with fixation of the fibula without fixation of the posterior lip. 2.  Open reduction internal fixation left syndesmosis 3..  3 views plus a stress view of the left ankle  SURGEON:  Johnny Bridge, MD  PHYSICIAN ASSISTANT: Merlene Pulling, PA-C, present and scrubbed throughout the case, critical for completion in a timely fashion, and for retraction, instrumentation, and closure.  ANESTHESIA:   General with exparel regional block  ESTIMATED BLOOD LOSS: minimal  UNIQUE ASPECTS OF THE CASE:  Substantial medial fracture blistering with a small area of skin necrosis.  Laterally at the incision the skin was in reasonably good condition.        PREOPERATIVE INDICATIONS:  Linda [name] is a  79 y.o. female with a diagnosis of LEFT ANKLE FRACTURE who elected for surgical management to minimize the risk for malunion and nonunion and post-traumatic arthritis.  She initially was seen in the emergency room, and had what sounds like an intra-articular injection, and did not have a documented reduction, presented to the office with significant lateral subluxation of the talus with skin tenting and skin necrosis medially.  She elected for urgent surgical management.  The risks benefits and alternatives were discussed with the patient preoperatively including but not limited to the risks of infection, bleeding, nerve injury, cardiopulmonary complications, the need for revision surgery, the need for hardware removal, among others, and the patient was willing to proceed.  We also discussed the possibility for external fixation, as well as skin necrosis,  the potential need for grafting, nonunion, and even amputation.  OPERATIVE IMPLANTS: Biomet / Zimmer distal fibular locking plate with 4 distal screws, 1 interfragmentary screw, and 3 proximal screws, 1 tricortical cortical screw for the syndesmosis augmentation . OPERATIVE PROCEDURE: The patient was brought to the operating room and placed in the supine position. All bony prominences were padded. General anesthesia was administered. The lower extremity was prepped and draped in the usual sterile fashion.  After the prep and drape, I punctured the fracture blisters, and then covered them with Tegaderm.  The leg was elevated and exsanguinated and the tourniquet was inflated. Time out was performed.   Incision was made over the distal fibula and the fracture was exposed and reduced anatomically with a clamp. A interfragmentary screw was placed. I then applied a 1/3 tubular locking plate given her likely osteopenia and weak bone, and secured it proximally with nonlocking screws and distally with locking screws. Bone quality was mediocre. I used c-arm to confirm satisfactory reduction and fixation.   The syndesmosis was stressed using live fluoroscopy and found to be questionable.  The posterior malleolus piece was very small, and did not require fixation.  I did however feel that augmentation of the syndesmosis would be of some benefit, and I did not want to use a medial fixation device, so I elected to use a screw instead, particularly because I did not want to disturb the medial soft tissue which was already severely damaged.  Therefore I held manual reduction of the syndesmosis with my fingers, and then drilled and placed the screw.  Excellent fixation was achieved.  The wounds were irrigated, and closed with vicryl with routine closure for  the skin. The wounds were injected with local anesthetic. Sterile gauze was applied followed by a posterior splint. She was awakened and returned to the PACU in  stable and satisfactory condition. There were no complications.  The medial side was dressed with Tegaderm.  3-Views plus a stress view of the ankle demonstrated appropriate reconstruction of the mortise with internal fixation and appropriate stability of the syndesmosis.

## 2019-08-25 ENCOUNTER — Encounter: Payer: Self-pay | Admitting: *Deleted

## 2019-08-25 NOTE — Anesthesia Postprocedure Evaluation (Signed)
Anesthesia Post Note  Patient: Linda [name]  Procedure(s) Performed: OPEN REDUCTION INTERNAL FIXATION (ORIF) LEFT DISTAL FIBULA FRACTURE (Left Ankle)     Patient location during evaluation: PACU Anesthesia Type: General Level of consciousness: awake and alert Pain management: pain level controlled Vital Signs Assessment: post-procedure vital signs reviewed and stable Respiratory status: spontaneous breathing, nonlabored ventilation, respiratory function stable and patient connected to nasal cannula oxygen Cardiovascular status: blood pressure returned to baseline and stable Postop Assessment: no apparent nausea or vomiting Anesthetic complications: no    Last Vitals:  Vitals:   08/24/19 1545 08/24/19 1601  BP: (!) 122/46 137/60  Pulse: 76 84  Resp: 16 16  Temp:  36.8 C  SpO2: 91% 97%    Last Pain:  Vitals:   08/24/19 1601  TempSrc: Oral  PainSc: 0-No pain                 Keta [place]

## 2019-08-30 DIAGNOSIS — S8262XD Displaced fracture of lateral malleolus of left fibula, subsequent encounter for closed fracture with routine healing: Secondary | ICD-10-CM | POA: Diagnosis not present

## 2019-09-03 ENCOUNTER — Telehealth: Payer: Self-pay | Admitting: Internal Medicine

## 2019-09-03 NOTE — Telephone Encounter (Signed)
Pt reports she has been having some epigastric discomfort. Pt has appt scheduled for next week. She wanted to know if she could take gaviscon. DIscussed with pt that she can try the gaviscon and see if it helped. Discussed with her that she can try pepcid otc if the gaviscon does not help. Pt will keep her appt next week.

## 2019-09-03 NOTE — Telephone Encounter (Signed)
Pt is scheduled for an appt 09/07/19 with Colleen.  She would like a call to discuss OTC meds for her GI issues.

## 2019-09-05 NOTE — Progress Notes (Signed)
09/05/2019 Linda [name] [id number] Dec 26, 1940   Chief Complaint: Abdominal pain   HISTORY OF PRESENT ILLNESS:  Linda [name] is a 79 year old female with a past medical history of anxiety, glaucoma, IDA, GERD, hiatal hernia, benign distal esophagus stricure and colon polyps. S/P left ankle surgery 08/24/2019 and bilateral cataract surgery. She presents today for further evaluation for epigastric pain which started 1 week ago. She describes feeling "queasy", somewhat nauseous in her esophagus. No distinct heartburn. No vomiting. Decreased appetite. No weight loss. No dysphagia. She passed 1 or 2 solid black stools on 1/21 and 1/22. Since then, her stools are normal brown in color. No rectal bleeding. She took Pepto bismal 2 tabs on 1/21 after she passed the black stool. She does not oral iron. She is taking Gaviscon 1 tablespoon x 3 doses which has reduced her nausea. No NSAIDS. Her most recent colonoscopy was 06/21/2015 which identified 2 sessile serrated polyps which were removed from the transverse colon. Paternal grandmother with history of colon cancer. Her daughter, Linda [name], is present (she has Crohn's disease).    CBC Latest Ref Rng & Units 08/25/2018 11/24/2017 02/28/2015  WBC 4.0 - 10.5 K/uL 7.4 9.6 7.4  Hemoglobin 12.0 - 15.0 g/dL 12.8 12.8 12.8  Hematocrit 36.0 - 46.0 % 38.6 38.5 38.4  Platelets 150.0 - 400.0 K/uL 213.0 258.0 203.0   Colonoscopy 06/21/2015 by Dr. Hilarie Fredrickson: 1. Two sessile serrated polyps ranging between 3-43mm in size were found in the transverse colon; polypectomies were     performed with a cold snare. 2. The examination was otherwise normal  Colonoscopy 04/30/2004:  - NORMAL EXAM: Cecum to Rectum.    VERY tortuous and redundant colon noted.   Colonoscopy 01/09/2000: -Small cecal polyp  EGD 12/23/2005 by Dr. Sharlett Iles:  - Normal: Proximal Esophagus to Distal Esophagus.  - HIATAL HERNIA: Prolapsing, 3 cms. in length.  - Normal: Fundus to Duodenal 2nd  Portion. - STRICTURE / STENOSIS: Distal Esophagus.  Constriction: partial.   Etiology: benign due to reflux. Lumen diameter is 14 mm. - Dilation: Distal Esophagus. Maloney dilator used, Diameter: 56 F,   Minimal Resistance, No Heme present on extraction.   Past Medical History:  Diagnosis Date  . ALLERGIC RHINITIS   . Allergy   . ANEMIA-IRON DEFICIENCY   . Anxiety state, unspecified   . Carpal tunnel syndrome    pt denies   . Closed left ankle fracture   . COLONIC POLYPS, HX OF 2007  . GERD   . GLAUCOMA   . GOITER, MULTINODULAR    on [place] 2006, unchanged 6/13 with nodules all <57mm  . HIATAL HERNIA WITH REFLUX   . Hip dysplasia, acquired    B sx; eval at [facility] for same 01/2015 -ongoing PT  . OSTEOARTHRITIS   . TOBACCO USE, QUIT    Past Surgical History:  Procedure Laterality Date  . BREAST SURGERY  1960   Left breast cyst removed no complications  . CARPAL TUNNEL RELEASE Right   . COLONOSCOPY  2005  . EYE SURGERY  1991-1992   both eyes  . EYE SURGERY Left 2014   shunt behind left eye  . ORIF FIBULA FRACTURE Left 08/24/2019   Procedure: OPEN REDUCTION INTERNAL FIXATION (ORIF) LEFT DISTAL FIBULA FRACTURE;  Surgeon: Marchia Bond, MD;  Location: [place];  Service: Orthopedics;  Laterality: Left;  . Right shoulder aurgery  10/2008    reports that she quit smoking about 31 years ago. She has  never used smokeless tobacco. She reports current alcohol use. She reports that she does not use drugs. family history includes Arthritis in her father and mother; Colon cancer in her paternal grandmother; Diabetes in her mother; Heart disease in her mother; Hypertension in her mother. No Known Allergies    Outpatient Encounter Medications as of 09/07/2019  Medication Sig  . acetaminophen (TYLENOL ARTHRITIS PAIN) 650 MG CR tablet Take 1,300 mg by mouth 2 (two) times daily.   Marland Kitchen b complex vitamins tablet Take 1 tablet by mouth daily.    . busPIRone (BUSPAR) 5 MG tablet Take 1  tablet (5 mg total) by mouth 2 (two) times daily as needed.  . dorzolamide-timolol (COSOPT) 22.3-6.8 MG/ML ophthalmic solution Place 1 drop into both eyes 2 (two) times daily.  Marland Kitchen gabapentin (NEURONTIN) 300 MG capsule Take 600 mg by mouth at bedtime.  Marland Kitchen morphine (MSIR) 15 MG tablet Take 1 tablet (15 mg total) by mouth every 4 (four) hours as needed for severe pain.  Marland Kitchen omeprazole (PRILOSEC) 20 MG capsule Take 1 capsule (20 mg total) by mouth daily.  . sertraline (ZOLOFT) 100 MG tablet Take 1 tablet (100 mg total) by mouth daily.  . traMADol (ULTRAM) 50 MG tablet Take 1 tablet (50 mg total) by mouth every 6 (six) hours as needed.   No facility-administered encounter medications on file as of 09/07/2019.     REVIEW OF SYSTEMS  : All other systems reviewed and negative except where noted in the History of Present Illness.   PHYSICAL EXAM: BP (!) 152/70   Pulse 100   Temp 97.8 F (36.6 C)  General: Well developed 79 year old female in no acute distress. Head: Normocephalic and atraumatic. Eyes:  Sclerae non-icteric, conjunctive pink. Ears: Normal auditory acuity. Neck: Supple, no lymphadenopathy or thyromegaly.  Lungs: Clear bilaterally to auscultation without wheezes, crackles or rhonchi. Heart: Regular rate and rhythm. No murmur, rub or gallop appreciated.  Abdomen: Soft, nontender, non distended. No masses. No hepatosplenomegaly. Normoactive bowel sounds x 4 quadrants.  Rectal: Patient declined rectal exam today, she elects to complete heme slides at home to assess for blood in stool. Musculoskeletal: Symmetrical with no gross deformities. Skin: Warm and dry. No rash or lesions on visible extremities. Extremities: No edema. LLE with a cast elevated on chair.  Neurological: Alert oriented x 4, no focal deficits.  Psychological:  Alert and cooperative. Normal mood and affect.  ASSESSMENT AND PLAN:  52. 79 year old female with epigastric pain, nausea and black solid stool x 1 -Increase  Omeprazole 40mg  once daily -Schedule an EGD in 4 weeks, earlier if her epigastric discomfort, nausea worsens or if she has recurrence of a black stool. -CBC, CMP -Patient to call office if symptoms worsen   2. GERD, history of a distal esophageal stricture -See plan # 1  3. History of colon polps -Next colonoscopy due 06/2020  4. S/P left ankle surgery by Dr. Mardelle Matte 08/24/2019   CC:  Hoyt Koch, *

## 2019-09-06 DIAGNOSIS — M25512 Pain in left shoulder: Secondary | ICD-10-CM | POA: Diagnosis not present

## 2019-09-06 DIAGNOSIS — S8262XD Displaced fracture of lateral malleolus of left fibula, subsequent encounter for closed fracture with routine healing: Secondary | ICD-10-CM | POA: Diagnosis not present

## 2019-09-07 ENCOUNTER — Ambulatory Visit (INDEPENDENT_AMBULATORY_CARE_PROVIDER_SITE_OTHER): Payer: PPO | Admitting: Nurse Practitioner

## 2019-09-07 ENCOUNTER — Other Ambulatory Visit (INDEPENDENT_AMBULATORY_CARE_PROVIDER_SITE_OTHER): Payer: PPO

## 2019-09-07 ENCOUNTER — Encounter: Payer: Self-pay | Admitting: Nurse Practitioner

## 2019-09-07 VITALS — BP 152/70 | HR 100 | Temp 97.8°F

## 2019-09-07 DIAGNOSIS — R110 Nausea: Secondary | ICD-10-CM

## 2019-09-07 DIAGNOSIS — K219 Gastro-esophageal reflux disease without esophagitis: Secondary | ICD-10-CM

## 2019-09-07 DIAGNOSIS — Z01818 Encounter for other preprocedural examination: Secondary | ICD-10-CM | POA: Diagnosis not present

## 2019-09-07 DIAGNOSIS — R1013 Epigastric pain: Secondary | ICD-10-CM

## 2019-09-07 DIAGNOSIS — K921 Melena: Secondary | ICD-10-CM

## 2019-09-07 LAB — CBC WITH DIFFERENTIAL/PLATELET
Basophils Absolute: 0.1 10*3/uL (ref 0.0–0.1)
Basophils Relative: 0.6 % (ref 0.0–3.0)
Eosinophils Absolute: 0 10*3/uL (ref 0.0–0.7)
Eosinophils Relative: 0.3 % (ref 0.0–5.0)
HCT: 36.9 % (ref 36.0–46.0)
Hemoglobin: 12.4 g/dL (ref 12.0–15.0)
Lymphocytes Relative: 17.9 % (ref 12.0–46.0)
Lymphs Abs: 1.8 10*3/uL (ref 0.7–4.0)
MCHC: 33.6 g/dL (ref 30.0–36.0)
MCV: 90.8 fl (ref 78.0–100.0)
Monocytes Absolute: 0.6 10*3/uL (ref 0.1–1.0)
Monocytes Relative: 5.6 % (ref 3.0–12.0)
Neutro Abs: 7.8 10*3/uL — ABNORMAL HIGH (ref 1.4–7.7)
Neutrophils Relative %: 75.6 % (ref 43.0–77.0)
Platelets: 330 10*3/uL (ref 150.0–400.0)
RBC: 4.06 Mil/uL (ref 3.87–5.11)
RDW: 13.6 % (ref 11.5–15.5)
WBC: 10.3 10*3/uL (ref 4.0–10.5)

## 2019-09-07 LAB — COMPREHENSIVE METABOLIC PANEL
ALT: 12 U/L (ref 0–35)
AST: 14 U/L (ref 0–37)
Albumin: 4.3 g/dL (ref 3.5–5.2)
Alkaline Phosphatase: 100 U/L (ref 39–117)
BUN: 17 mg/dL (ref 6–23)
CO2: 26 mEq/L (ref 19–32)
Calcium: 9.5 mg/dL (ref 8.4–10.5)
Chloride: 102 mEq/L (ref 96–112)
Creatinine, Ser: 0.59 mg/dL (ref 0.40–1.20)
GFR: 98.31 mL/min (ref >60.00)
Glucose, Bld: 101 mg/dL — ABNORMAL HIGH (ref 70–99)
Potassium: 4.1 mEq/L (ref 3.5–5.1)
Sodium: 136 mEq/L (ref 135–145)
Total Bilirubin: 0.5 mg/dL (ref 0.2–1.2)
Total Protein: 7 g/dL (ref 6.0–8.3)

## 2019-09-07 MED ORDER — OMEPRAZOLE 40 MG PO CPDR: 40.0000 mg | DELAYED_RELEASE_CAPSULE | Freq: Every day | ORAL | 1 refills | Status: DC

## 2019-09-07 NOTE — Patient Instructions (Addendum)
If you are age 79 or older, your body mass index should be between 23-30. Your There is no height or weight on file to calculate BMI. If this is out of the aforementioned range listed, please consider follow up with your Primary Care Provider.  If you are age 88 or younger, your body mass index should be between 19-25. Your There is no height or weight on file to calculate BMI. If this is out of the aformentioned range listed, please consider follow up with your Primary Care Provider.   Your provider has requested that you go to the basement level for lab work before leaving today. Press "B" on the elevator. The lab is located at the first door on the left as you exit the elevator.  Please use the Hemoccult card that has been given to you and return it to the lab.  We have sent the following medications to your pharmacy for you to pick up at your convenience:  Omeprazole 40mg   Thank you for choosing [facility] Noralyn Pick, CRNP

## 2019-09-08 NOTE — Progress Notes (Signed)
Addendum: Reviewed and agree with assessment and management plan. Mckenzye Cutright M, MD  

## 2019-09-13 DIAGNOSIS — S8262XD Displaced fracture of lateral malleolus of left fibula, subsequent encounter for closed fracture with routine healing: Secondary | ICD-10-CM | POA: Diagnosis not present

## 2019-09-16 ENCOUNTER — Other Ambulatory Visit (INDEPENDENT_AMBULATORY_CARE_PROVIDER_SITE_OTHER): Payer: PPO

## 2019-09-16 DIAGNOSIS — R110 Nausea: Secondary | ICD-10-CM

## 2019-09-16 DIAGNOSIS — K921 Melena: Secondary | ICD-10-CM

## 2019-09-16 DIAGNOSIS — K219 Gastro-esophageal reflux disease without esophagitis: Secondary | ICD-10-CM | POA: Diagnosis not present

## 2019-09-16 LAB — HEMOCCULT SLIDES (X 3 CARDS)
Fecal Occult Blood: NEGATIVE
OCCULT 1: NEGATIVE
OCCULT 2: NEGATIVE
OCCULT 3: NEGATIVE
OCCULT 4: NEGATIVE
OCCULT 5: NEGATIVE

## 2019-09-27 DIAGNOSIS — S8262XD Displaced fracture of lateral malleolus of left fibula, subsequent encounter for closed fracture with routine healing: Secondary | ICD-10-CM | POA: Diagnosis not present

## 2019-09-29 DIAGNOSIS — S8262XD Displaced fracture of lateral malleolus of left fibula, subsequent encounter for closed fracture with routine healing: Secondary | ICD-10-CM | POA: Diagnosis not present

## 2019-10-06 ENCOUNTER — Encounter: Payer: Self-pay | Admitting: Internal Medicine

## 2019-10-08 ENCOUNTER — Telehealth: Payer: Self-pay | Admitting: Nurse Practitioner

## 2019-10-08 NOTE — Telephone Encounter (Signed)
Pt is scheduled for EGD 10/19/19 and has questions regarding covid test.

## 2019-10-08 NOTE — Telephone Encounter (Signed)
Contacted the patient, she requested to not have her Covid testing done. I explained to her that she needed to have it completed in order to keep her appointment for the EGD on 10/19/2019. She stated that she felt uncomfortable going into the building to have her test done, I explained she needed to wear a mask there will only be a few p eople in the office and socially distanced. Patient stated that she would call them and change her appointment to later in the morning because it was to early. Patient verbalized understanding that the Covid test needed to be completed to have her EGD

## 2019-10-11 DIAGNOSIS — S8262XD Displaced fracture of lateral malleolus of left fibula, subsequent encounter for closed fracture with routine healing: Secondary | ICD-10-CM | POA: Diagnosis not present

## 2019-10-15 ENCOUNTER — Other Ambulatory Visit: Payer: Self-pay | Admitting: Internal Medicine

## 2019-10-15 ENCOUNTER — Ambulatory Visit (INDEPENDENT_AMBULATORY_CARE_PROVIDER_SITE_OTHER): Payer: PPO

## 2019-10-15 DIAGNOSIS — Z1159 Encounter for screening for other viral diseases: Secondary | ICD-10-CM

## 2019-10-16 LAB — SARS CORONAVIRUS 2 (TAT 6-24 HRS): SARS Coronavirus 2: NEGATIVE

## 2019-10-18 DIAGNOSIS — M8580 Other specified disorders of bone density and structure, unspecified site: Secondary | ICD-10-CM | POA: Diagnosis not present

## 2019-10-18 DIAGNOSIS — M48061 Spinal stenosis, lumbar region without neurogenic claudication: Secondary | ICD-10-CM | POA: Diagnosis not present

## 2019-10-18 DIAGNOSIS — H409 Unspecified glaucoma: Secondary | ICD-10-CM | POA: Diagnosis not present

## 2019-10-18 DIAGNOSIS — Z9181 History of falling: Secondary | ICD-10-CM | POA: Diagnosis not present

## 2019-10-18 DIAGNOSIS — F329 Major depressive disorder, single episode, unspecified: Secondary | ICD-10-CM | POA: Diagnosis not present

## 2019-10-18 DIAGNOSIS — H269 Unspecified cataract: Secondary | ICD-10-CM | POA: Diagnosis not present

## 2019-10-18 DIAGNOSIS — D649 Anemia, unspecified: Secondary | ICD-10-CM | POA: Diagnosis not present

## 2019-10-18 DIAGNOSIS — H9193 Unspecified hearing loss, bilateral: Secondary | ICD-10-CM | POA: Diagnosis not present

## 2019-10-18 DIAGNOSIS — M419 Scoliosis, unspecified: Secondary | ICD-10-CM | POA: Diagnosis not present

## 2019-10-18 DIAGNOSIS — F419 Anxiety disorder, unspecified: Secondary | ICD-10-CM | POA: Diagnosis not present

## 2019-10-18 DIAGNOSIS — K219 Gastro-esophageal reflux disease without esophagitis: Secondary | ICD-10-CM | POA: Diagnosis not present

## 2019-10-18 DIAGNOSIS — M1990 Unspecified osteoarthritis, unspecified site: Secondary | ICD-10-CM | POA: Diagnosis not present

## 2019-10-18 DIAGNOSIS — S82892D Other fracture of left lower leg, subsequent encounter for closed fracture with routine healing: Secondary | ICD-10-CM | POA: Diagnosis not present

## 2019-10-19 ENCOUNTER — Other Ambulatory Visit: Payer: Self-pay

## 2019-10-19 ENCOUNTER — Ambulatory Visit (AMBULATORY_SURGERY_CENTER): Payer: PPO | Admitting: Internal Medicine

## 2019-10-19 ENCOUNTER — Encounter: Payer: Self-pay | Admitting: Internal Medicine

## 2019-10-19 VITALS — BP 134/69 | HR 73 | Temp 96.6°F | Resp 18 | Ht 64.0 in | Wt 138.0 lb

## 2019-10-19 DIAGNOSIS — K222 Esophageal obstruction: Secondary | ICD-10-CM | POA: Diagnosis not present

## 2019-10-19 DIAGNOSIS — R110 Nausea: Secondary | ICD-10-CM

## 2019-10-19 DIAGNOSIS — R1013 Epigastric pain: Secondary | ICD-10-CM

## 2019-10-19 DIAGNOSIS — K921 Melena: Secondary | ICD-10-CM

## 2019-10-19 DIAGNOSIS — K317 Polyp of stomach and duodenum: Secondary | ICD-10-CM | POA: Diagnosis not present

## 2019-10-19 DIAGNOSIS — K219 Gastro-esophageal reflux disease without esophagitis: Secondary | ICD-10-CM | POA: Diagnosis not present

## 2019-10-19 DIAGNOSIS — K2970 Gastritis, unspecified, without bleeding: Secondary | ICD-10-CM | POA: Diagnosis not present

## 2019-10-19 DIAGNOSIS — K3189 Other diseases of stomach and duodenum: Secondary | ICD-10-CM | POA: Diagnosis not present

## 2019-10-19 MED ORDER — SODIUM CHLORIDE 0.9 % IV SOLN: 500.0000 mL | Freq: Once | INTRAVENOUS | Status: DC

## 2019-10-19 NOTE — Progress Notes (Signed)
Called to room to assist during endoscopic procedure.  Patient ID and intended procedure confirmed with present staff. Received instructions for my participation in the procedure from the performing physician.  

## 2019-10-19 NOTE — Op Note (Signed)
[place] Patient Name: Linda [name] Procedure Date: 10/19/2019 9:19 AM MRN: [id number] Endoscopist: Jerene Bears , MD Age: 79 Referring MD:  Date of Birth: 1941/06/03 Gender: Female Account #: [id number] Procedure:                Upper GI endoscopy Indications:              Epigastric abdominal pain, Nausea, melenic stools                            (now resolved) Medicines:                Monitored Anesthesia Care Procedure:                Pre-Anesthesia Assessment:                           - Prior to the procedure, a History and Physical                            was performed, and patient medications and                            allergies were reviewed. The patient's tolerance of                            previous anesthesia was also reviewed. The risks                            and benefits of the procedure and the sedation                            options and risks were discussed with the patient.                            All questions were answered, and informed consent                            was obtained. Prior Anticoagulants: The patient has                            taken no previous anticoagulant or antiplatelet                            agents. ASA Grade Assessment: II - A patient with                            mild systemic disease. After reviewing the risks                            and benefits, the patient was deemed in                            satisfactory condition to undergo the procedure.  After obtaining informed consent, the endoscope was                            passed under direct vision. Throughout the                            procedure, the patient's blood pressure, pulse, and                            oxygen saturations were monitored continuously. The                            Endoscope was introduced through the mouth, and                            advanced to the second part of duodenum. The  upper                            GI endoscopy was accomplished without difficulty.                            The patient tolerated the procedure well. Scope In: Scope Out: Findings:                 Normal mucosa was found in the entire esophagus.                           A small (1-2 cm) hiatal hernia was present with                            partial, non-obstructing, Schatzki's ring.                           Striped moderately erythematous mucosa was found in                            the gastric antrum. Biopsies were taken with a cold                            forceps for histology and Helicobacter pylori                            testing.                           A few small sessile polyps with no bleeding were                            found in the gastric fundus and in the gastric                            body. These polyps are benign-appearing with the  typical appearance of fundic gland polyps.                           The examined duodenum was normal. Complications:            No immediate complications. Estimated Blood Loss:     Estimated blood loss was minimal. Impression:               - Normal mucosa was found in the entire esophagus.                           - Small hiatal hernia.                           - A few, benign, gastric polyps.                           - Erythematous mucosa in the antrum. Biopsied to                            exclude H. Pylori infection.                           - Normal examined duodenum. Recommendation:           - Patient has a contact number available for                            emergencies. The signs and symptoms of potential                            delayed complications were discussed with the                            patient. Return to normal activities tomorrow.                            Written discharge instructions were provided to the                            patient.                            - Resume previous diet.                           - Continue present medications including omeprazole                            40 mg daily.                           - Await pathology results.                           - Let me know if you see further balck stools. Jerene Bears, MD 10/19/2019 9:59:46 AM This report has been signed electronically.

## 2019-10-19 NOTE — Patient Instructions (Signed)
Take your medicine as directed.  Thank-you for choosing [place] for your healthcare needs today.  YOU HAD AN ENDOSCOPIC PROCEDURE TODAY AT [facility]:   Refer to the procedure report that was given to you for any specific questions about what was found during the examination.  If the procedure report does not answer your questions, please call your gastroenterologist to clarify.  If you requested that your care partner not be given the details of your procedure findings, then the procedure report has been included in a sealed envelope for you to review at your convenience later.  YOU SHOULD EXPECT: Some feelings of bloating in the abdomen. Passage of more gas than usual.  Walking can help get rid of the air that was put into your GI tract during the procedure and reduce the bloating.   Please Note:  You might notice some irritation and congestion in your nose or some drainage.  This is from the oxygen used during your procedure.  There is no need for concern and it should clear up in a day or so.  SYMPTOMS TO REPORT IMMEDIATELY:    Following upper endoscopy (EGD)  Vomiting of blood or coffee ground material  New chest pain or pain under the shoulder blades  Painful or persistently difficult swallowing  New shortness of breath  Fever of 100F or higher  Black, tarry-looking stools  For urgent or emergent issues, a gastroenterologist can be reached at any hour by calling [phone]. Do not use MyChart messaging for urgent concerns.    DIET:  We do recommend a small meal at first, but then you may proceed to your regular diet.  Drink plenty of fluids but you should avoid alcoholic beverages for 24 hours.  ACTIVITY:  You should plan to take it easy for the rest of today and you should NOT DRIVE or use heavy machinery until tomorrow (because of the sedation medicines used during the test).    FOLLOW UP: Our staff will call the number listed on your records 48-72 hours  following your procedure to check on you and address any questions or concerns that you may have regarding the information given to you following your procedure. If we do not reach you, we will leave a message.  We will attempt to reach you two times.  During this call, we will ask if you have developed any symptoms of COVID 19. If you develop any symptoms (ie: fever, flu-like symptoms, shortness of breath, cough etc.) before then, please call [phone].  If you test positive for Covid 19 in the 2 weeks post procedure, please call and report this information to [place].    If any biopsies were taken you will be contacted by phone or by letter within the next 1-3 weeks.  Please call us at [phone] if you have not heard about the biopsies in 3 weeks.    SIGNATURES/CONFIDENTIALITY: You and/or your care partner have signed paperwork which will be entered into your electronic medical record.  These signatures attest to the fact that that the information above on your After Visit Summary has been reviewed and is understood.  Full responsibility of the confidentiality of this discharge information lies with you and/or your care-partner.

## 2019-10-19 NOTE — Progress Notes (Signed)
Temp by LC, Vitals by CW

## 2019-10-19 NOTE — Progress Notes (Signed)
To PACU, VSS. Report to [email or web address] 

## 2019-10-21 ENCOUNTER — Telehealth: Payer: Self-pay | Admitting: Nurse Practitioner

## 2019-10-21 ENCOUNTER — Telehealth: Payer: Self-pay | Admitting: *Deleted

## 2019-10-21 ENCOUNTER — Telehealth: Payer: Self-pay

## 2019-10-21 NOTE — Telephone Encounter (Signed)
  Follow up Call-  Call back number 10/19/2019  Post procedure Call Back phone  # [phone]  Permission to leave phone message Yes  Some recent data might be hidden     Patient questions:  Do you have a fever, pain , or abdominal swelling? No. Pain Score  0 *  Have you tolerated food without any problems? Yes.    Have you been able to return to your normal activities? Yes.    Do you have any questions about your discharge instructions: Diet   No. Medications  No. Follow up visit  No.  Do you have questions or concerns about your Care? No.  Actions: * If pain score is 4 or above: No action needed, pain <4.   1. Have you developed a fever since your procedure? no  2.   Have you had an respiratory symptoms (SOB or cough) since your procedure? no  3.   Have you tested positive for COVID 19 since your procedure no  4.   Have you had any family members/close contacts diagnosed with the COVID 19 since your procedure?  no   If yes to any of these questions please route to Joylene John, RN and Alphonsa Gin, Therapist, sports.

## 2019-10-21 NOTE — Telephone Encounter (Signed)
Attempted to reach pt. With follow-up call following endoscopic procedure 10/19/19.  LM on pt. Voice mail.  Will try to reach pt. Again later today.

## 2019-10-25 ENCOUNTER — Telehealth: Payer: Self-pay | Admitting: Internal Medicine

## 2019-10-25 NOTE — Telephone Encounter (Signed)
The pt has been advised that we will contact her as soon as Dr Hilarie Fredrickson reviews.

## 2019-10-29 ENCOUNTER — Encounter: Payer: Self-pay | Admitting: Internal Medicine

## 2019-10-30 ENCOUNTER — Other Ambulatory Visit: Payer: Self-pay | Admitting: Internal Medicine

## 2019-11-08 DIAGNOSIS — S8262XD Displaced fracture of lateral malleolus of left fibula, subsequent encounter for closed fracture with routine healing: Secondary | ICD-10-CM | POA: Diagnosis not present

## 2019-11-25 DIAGNOSIS — M419 Scoliosis, unspecified: Secondary | ICD-10-CM | POA: Diagnosis not present

## 2019-11-25 DIAGNOSIS — M1990 Unspecified osteoarthritis, unspecified site: Secondary | ICD-10-CM | POA: Diagnosis not present

## 2019-11-25 DIAGNOSIS — H269 Unspecified cataract: Secondary | ICD-10-CM | POA: Diagnosis not present

## 2019-11-25 DIAGNOSIS — S82892D Other fracture of left lower leg, subsequent encounter for closed fracture with routine healing: Secondary | ICD-10-CM | POA: Diagnosis not present

## 2019-11-25 DIAGNOSIS — M48061 Spinal stenosis, lumbar region without neurogenic claudication: Secondary | ICD-10-CM | POA: Diagnosis not present

## 2019-11-25 DIAGNOSIS — F329 Major depressive disorder, single episode, unspecified: Secondary | ICD-10-CM | POA: Diagnosis not present

## 2019-11-25 DIAGNOSIS — K219 Gastro-esophageal reflux disease without esophagitis: Secondary | ICD-10-CM | POA: Diagnosis not present

## 2019-11-25 DIAGNOSIS — H409 Unspecified glaucoma: Secondary | ICD-10-CM | POA: Diagnosis not present

## 2019-11-25 DIAGNOSIS — M8580 Other specified disorders of bone density and structure, unspecified site: Secondary | ICD-10-CM | POA: Diagnosis not present

## 2019-11-25 DIAGNOSIS — Z9181 History of falling: Secondary | ICD-10-CM | POA: Diagnosis not present

## 2019-11-25 DIAGNOSIS — H9193 Unspecified hearing loss, bilateral: Secondary | ICD-10-CM | POA: Diagnosis not present

## 2019-11-25 DIAGNOSIS — F419 Anxiety disorder, unspecified: Secondary | ICD-10-CM | POA: Diagnosis not present

## 2019-11-25 DIAGNOSIS — D649 Anemia, unspecified: Secondary | ICD-10-CM | POA: Diagnosis not present

## 2019-11-29 DIAGNOSIS — R269 Unspecified abnormalities of gait and mobility: Secondary | ICD-10-CM | POA: Diagnosis not present

## 2019-11-29 DIAGNOSIS — M25572 Pain in left ankle and joints of left foot: Secondary | ICD-10-CM | POA: Diagnosis not present

## 2019-12-01 ENCOUNTER — Other Ambulatory Visit: Payer: Self-pay

## 2019-12-01 ENCOUNTER — Ambulatory Visit (INDEPENDENT_AMBULATORY_CARE_PROVIDER_SITE_OTHER): Payer: PPO

## 2019-12-01 VITALS — BP 118/70 | HR 76 | Temp 98.3°F | Resp 16 | Ht 64.0 in | Wt 139.0 lb

## 2019-12-01 DIAGNOSIS — Z0000 Encounter for general adult medical examination without abnormal findings: Secondary | ICD-10-CM

## 2019-12-01 DIAGNOSIS — R269 Unspecified abnormalities of gait and mobility: Secondary | ICD-10-CM | POA: Diagnosis not present

## 2019-12-01 DIAGNOSIS — M25572 Pain in left ankle and joints of left foot: Secondary | ICD-10-CM | POA: Diagnosis not present

## 2019-12-01 NOTE — Progress Notes (Addendum)
Subjective:   Linda [name] is a 79 y.o. female who presents for Medicare Annual (Subsequent) preventive examination.  Review of Systems:  No ROS Medicare Wellness Visit Cardiac Risk Factors include: advanced age (>54men, >66 women);family history of premature cardiovascular disease Sleep Patterns: No problems falling asleep; feels rested on waking. Home Safety/Smoke Alarms: Feels safe in home.  Smoke alarms in place. Living Environment/Residence/Firearm Safety: Lives in a 2-story home; lives alone; good family support.    Objective:     Vitals: BP 118/70 (BP Location: Left Arm, Patient Position: Sitting, Cuff Size: Normal)   Pulse 76   Temp 98.3 F (36.8 C)   Resp 16   Ht 5\' 4"  (1.626 m)   Wt 139 lb (63 kg)   SpO2 98%   BMI 23.86 kg/m   Body mass index is 23.86 kg/m.  Advanced Directives 12/01/2019 08/24/2019 08/23/2019 08/20/2019 01/21/2019 11/26/2018 08/22/2016  Does Patient Have a Medical Advance Directive? Yes Yes Yes No Yes Yes Yes  Type of Advance Directive Living will;Healthcare Power of [place];Living will Living will;Healthcare Power of [place];Living will [place];Living will [place];Living will  Does patient want to make changes to medical advance directive? No - Patient declined No - Patient declined No - Patient declined - - - -  Copy of [place] in Chart? No - copy requested No - copy requested No - copy requested - No - copy requested No - copy requested No - copy requested  Would patient like information on creating a medical advance directive? - No - Patient declined - No - Patient declined - - -    Tobacco Social History   Tobacco Use  Smoking Status Former Smoker   Quit date: 08/12/1988   Years since quitting: 31.3  Smokeless Tobacco Never Used     Counseling given: No   Clinical Intake:  Pre-visit preparation completed:  Yes  Pain : No/denies pain Pain Score: 0-No pain     BMI - recorded: 23.9 Nutritional Status: BMI of 19-24  Normal Nutritional Risks: None Diabetes: No  How often do you need to have someone help you when you read instructions, pamphlets, or other written materials from your doctor or pharmacy?: 1 - Never What is the last grade level you completed in school?: [employer] Common Director/Volunteer  Interpreter Needed?: No  Information entered by :: [employer]. N. Katiejo Gilroy, LPN  Past Medical History:  Diagnosis Date   ALLERGIC RHINITIS    Allergy    ANEMIA-IRON DEFICIENCY    Anxiety state, unspecified    Carpal tunnel syndrome    pt denies    Cataract    Closed left ankle fracture    COLONIC POLYPS, HX OF 2007   GERD    GLAUCOMA    Glaucoma    GOITER, MULTINODULAR    on [place] 2006, unchanged 6/13 with nodules all <66mm   HIATAL HERNIA WITH REFLUX    Hip dysplasia, acquired    B sx; eval [facility] for same 01/2015 -ongoing PT   OSTEOARTHRITIS    Osteoporosis    TOBACCO USE, QUIT    Past Surgical History:  Procedure Laterality Date   BREAST SURGERY  1960   Left breast cyst removed no complications   CARPAL TUNNEL RELEASE Right    COLONOSCOPY  2005   EYE SURGERY  1991-1992   both eyes   EYE SURGERY Left 2014   shunt  behind left eye   ORIF FIBULA FRACTURE Left 08/24/2019   Procedure: OPEN REDUCTION INTERNAL FIXATION (ORIF) LEFT DISTAL FIBULA FRACTURE;  Surgeon: Marchia Bond, MD;  Location: [place];  Service: Orthopedics;  Laterality: Left;   Right shoulder aurgery  10/2008   Family History  Problem Relation Age of Onset   Diabetes Mother    Heart disease Mother    Arthritis Mother    Hypertension Mother    Arthritis Father    Colon cancer Paternal Grandmother    Esophageal cancer Neg Hx    Rectal cancer Neg Hx    Stomach cancer Neg Hx    Social History   Socioeconomic History   Marital status: Widowed    Spouse name: Not on file   Number of children: 2    Years of education: Not on file   Highest education level: Not on file  Occupational History   Not on file  Tobacco Use   Smoking status: Former Smoker    Quit date: 08/12/1988    Years since quitting: 31.3   Smokeless tobacco: Never Used  Substance and Sexual Activity   Alcohol use: Yes    Alcohol/week: 0.0 standard drinks    Comment: occassionally   Drug use: No   Sexual activity: Not Currently    Birth control/protection: Post-menopausal  Other Topics Concern   Not on file  Social History Narrative   Married, 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   Social Determinants of Radio broadcast assistant Strain:    Difficulty of Paying Living Expenses:   Food Insecurity:    Worried About Charity fundraiser in the Last Year:    Arboriculturist in the Last Year:   Transportation Needs:    Film/video editor (Medical):    Lack of Transportation (Non-Medical):   Physical Activity:    Days of Exercise per Week:    Minutes of Exercise per Session:   Stress:    Feeling of Stress :   Social Connections:    Frequency of Communication with Friends and Family:    Frequency of Social Gatherings with Friends and Family:    Attends Religious Services:    Active Member of Clubs or Organizations:    Attends Archivist Meetings:    Marital Status:     Outpatient Encounter Medications as of 12/01/2019  Medication Sig   acetaminophen (TYLENOL ARTHRITIS PAIN) 650 MG CR tablet Take 1,300 mg by mouth 2 (two) times daily.    b complex vitamins tablet Take 1 tablet by mouth daily.     busPIRone (BUSPAR) 5 MG tablet Take 1 tablet (5 mg total) by mouth 2 (two) times daily as needed.   dorzolamide-timolol (COSOPT) 22.3-6.8 MG/ML ophthalmic solution Place 1 drop into both eyes 2 (two) times daily.   omeprazole (PRILOSEC) 40 MG capsule Take 1 capsule (40 mg total) by mouth daily.   sertraline (ZOLOFT) 100 MG tablet Take 1 tablet (100 mg total) by mouth daily. Annual appt IS due in must see  provider for future refills   No facility-administered encounter medications on file as of 12/01/2019.    Activities of Daily Living In your present state of health, do you have any difficulty performing the following activities: 12/01/2019 08/24/2019  Hearing? Y N  Comment wears hearing aids -  Vision? N N  Difficulty concentrating or making decisions? N N  Walking or climbing stairs? N N  Dressing or bathing? N N  Doing  errands, shopping? N -  Preparing Food and eating ? N -  Using the Toilet? N -  In the past six months, have you accidently leaked urine? N -  Do you have problems with loss of bowel control? N -  Managing your Medications? N -  Managing your Finances? N -  Housekeeping or managing your Housekeeping? N -  Some recent data might be hidden    Patient Care Team: Hoyt Koch, MD as PCP - General (Internal Medicine) Melida Quitter, MD as Consulting Physician Princess Bruins, MD (Obstetrics and Gynecology) Suella Broad, MD (Physical Medicine and Rehabilitation) Bond, Tracie Harrier, MD (Ophthalmology) Katy Apo, MD (Ophthalmology) Kristeen Miss, MD as Consulting Physician (Neurosurgery) Pyrtle, Lajuan Lines, MD as Consulting Physician (Gastroenterology) Ignatius Specking (Dentistry)    Assessment:   This is a routine wellness examination for [place].  Exercise Activities and Dietary recommendations Current Exercise Habits: The patient does not participate in regular exercise at present(recovering from ankle surgeryr; doing physical therapy 3 x week), Exercise limited by: orthopedic condition(s)  Goals      Client understands the importance of follow-up with providers by attending scheduled visits     Patient Stated     Continue to  Exercise,  eat healthier, go out with my friends be active socially through church and other activities.      Patient Stated     Continue to exercise by doing water aerobics and stay socially active within my church and with my  friends.     Patient Stated     Continue to exercise by doing water aerobics, volunteer more and get back on her feet.     Weight (lb) < 140 lb (63.5 kg)     Would like to lose 11 pounds by staying active and making healthy food choices.         Fall Risk Fall Risk  12/01/2019 11/26/2018 08/25/2018 11/20/2017 08/22/2016  Falls in the past year? 1 0 1 No Yes  Comment - - - - -  Number falls in past yr: 0 0 0 - 1  Comment - - - - -  Injury with Fall? 1 - 0 - No  Risk for fall due to : Orthopedic patient;Impaired balance/gait Impaired mobility - - -  Follow up Falls evaluation completed;Education provided;Falls prevention discussed Falls prevention discussed - - Falls prevention discussed   Is the patient's home free of loose throw rugs in walkways, pet beds, electrical cords, etc?   yes      Grab bars in the bathroom? yes      Handrails on the stairs?   yes      Adequate lighting?   yes  Depression Screen PHQ 2/9 Scores 12/01/2019 11/26/2018 08/25/2018 11/20/2017  PHQ - 2 Score 0 1 1 3   PHQ- 9 Score - 1 3 5      Cognitive Function     6CIT Screen 12/01/2019  What Year? 0 points  What month? 0 points  What time? 0 points  Count back from 20 0 points  Months in reverse 0 points  Repeat phrase 0 points  Total Score 0    Immunization History  Administered Date(s) Administered   Influenza Split 05/12/2012   Influenza Whole 05/12/2009, 05/29/2010   Influenza, High Dose Seasonal PF 06/12/2013, 06/05/2016   Influenza-Unspecified 05/12/2014, 05/27/2015, 05/12/2017, 05/28/2018   Pneumococcal Conjugate-13 08/10/2013   Pneumococcal Polysaccharide-23 07/12/2011   Td 01/13/2004   Tdap 02/23/2014   Zoster 01/27/2008, 05/12/2009   Zoster Recombinat (  Shingrix) 03/01/2019    Qualifies for Shingles Vaccine? Yes  Screening Tests Health Maintenance  Topic Date Due   COVID-19 Vaccine (1) Never done   INFLUENZA VACCINE  03/12/2020   COLONOSCOPY  06/27/2020   TETANUS/TDAP  02/24/2024     DEXA SCAN  Completed   PNA vac Low Risk Adult  Completed    Cancer Screenings: Lung: Low Dose CT Chest recommended if Age 30-80 years, 30 pack-year currently smoking OR have quit w/in 15years. Patient does not qualify. Breast:  Up to date on Mammogram? Yes   Up to date of Bone Density/Dexa? Yes Colorectal: not recommended due to age     Plan:     Reviewed health maintenance screenings with patient today and relevant education, vaccines, and/or referrals were provided.    Continue doing brain stimulating activities (puzzles, reading, adult coloring books, staying active) to keep memory Lites.    Continue to eat heart healthy diet (full of fruits, vegetables, whole grains, lean protein, water--limit salt, fat, and sugar intake) and increase physical activity as tolerated.    I have personally reviewed and noted the following in the patient's chart:   Medical and social history Use of alcohol, tobacco or illicit drugs  Current medications and supplements Functional ability and status Nutritional status Physical activity Advanced directives List of other physicians Hospitalizations, surgeries, and ER visits in previous 12 months Vitals Screenings to include cognitive, depression, and falls Referrals and appointments  In addition, I have reviewed and discussed with patient certain preventive protocols, quality metrics, and best practice recommendations. A written personalized care plan for preventive services as well as general preventive health recommendations were provided to patient.     Sheral Flow, LPN  [id number]  Nurse Health Advisor   Medical screening examination/treatment/procedure(s) were performed by non-physician practitioner and as supervising physician I was immediately available for consultation/collaboration.  I agree with above. Lew Dawes, MD

## 2019-12-01 NOTE — Patient Instructions (Addendum)
Ms. Denapoli , Thank you for taking time to come for your Medicare Wellness Visit. I appreciate your ongoing commitment to your health goals. Please review the following plan we discussed and let me know if I can assist you in the future.   Screening recommendations/referrals: Colorectal Screening: 06/28/2015 Mammogram: 03/17/2019 Bone Density: 03/10/2018  Vision and Dental Exams: Recommended annual ophthalmology exams for early detection of glaucoma and other disorders of the eye Recommended annual dental exams for proper oral hygiene  Vaccinations: Influenza vaccine: due 2021 Pneumococcal vaccine: completed Tdap vaccine: 02/23/2014; due every 10 years Shingles vaccine: completed Covid vaccine: completed  Advanced directives: Advance directives discussed with you today. Please bring a copy of your POA (Power of [place]) and/or Living Will to your next appointment.  Goals:  Recommend to drink at least 6-8 8oz glasses of water per day.  Recommend to exercise for at least 150 minutes per week.  Recommend to remove any items from the home that may cause slips or trips.  Recommend to decrease portion sizes by eating 3 small healthy meals and at least 2 healthy snacks per day.  Recommend to begin DASH diet as directed below  Recommend to continue efforts to reduce smoking habits until no longer smoking. Smoking Cessation literature is attached below.  Next appointment: Please schedule your Annual Wellness Visit with your Nurse Health Advisor in one year.  Preventive Care 15 Years and Older, Female Preventive care refers to lifestyle choices and visits with your health care provider that can promote health and wellness. What does preventive care include?  A yearly physical exam. This is also called an annual well check.  Dental exams once or twice a year.  Routine eye exams. Ask your health care provider how often you should have your eyes checked.  Personal lifestyle choices,  including:  Daily care of your teeth and gums.  Regular physical activity.  Eating a healthy diet.  Avoiding tobacco and drug use.  Limiting alcohol use.  Practicing safe sex.  Taking low-dose aspirin every day if recommended by your health care provider.  Taking vitamin and mineral supplements as recommended by your health care provider. What happens during an annual well check? The services and screenings done by your health care provider during your annual well check will depend on your age, overall health, lifestyle risk factors, and family history of disease. Counseling  Your health care provider may ask you questions about your:  Alcohol use.  Tobacco use.  Drug use.  Emotional well-being.  Home and relationship well-being.  Sexual activity.  Eating habits.  History of falls.  Memory and ability to understand (cognition).  Work and work Statistician.  Reproductive health. Screening  You may have the following tests or measurements:  Height, weight, and BMI.  Blood pressure.  Lipid and cholesterol levels. These may be checked every 5 years, or more frequently if you are over 57 years old.  Skin check.  Lung cancer screening. You may have this screening every year starting at age 34 if you have a 30-pack-year history of smoking and currently smoke or have quit within the past 15 years.  Fecal occult blood test (FOBT) of the stool. You may have this test every year starting at age 93.  Flexible sigmoidoscopy or colonoscopy. You may have a sigmoidoscopy every 5 years or a colonoscopy every 10 years starting at age 58.  Hepatitis C blood test.  Hepatitis B blood test.  Sexually transmitted disease (STD) testing.  Diabetes screening.  This is done by checking your blood sugar (glucose) after you have not eaten for a while (fasting). You may have this done every 1-3 years.  Bone density scan. This is done to screen for osteoporosis. You may have this  done starting at age 16.  Mammogram. This may be done every 1-2 years. Talk to your health care provider about how often you should have regular mammograms. Talk with your health care provider about your test results, treatment options, and if necessary, the need for more tests. Vaccines  Your health care provider may recommend certain vaccines, such as:  Influenza vaccine. This is recommended every year.  Tetanus, diphtheria, and acellular pertussis (Tdap, Td) vaccine. You may need a Td booster every 10 years.  Zoster vaccine. You may need this after age 50.  Pneumococcal 13-valent conjugate (PCV13) vaccine. One dose is recommended after age 79.  Pneumococcal polysaccharide (PPSV23) vaccine. One dose is recommended after age 57. Talk to your health care provider about which screenings and vaccines you need and how often you need them. This information is not intended to replace advice given to you by your health care provider. Make sure you discuss any questions you have with your health care provider. Document Released: 08/25/2015 Document Revised: 04/17/2016 Document Reviewed: 05/30/2015 Elsevier Interactive Patient Education  2017 [place] Prevention in the Home Falls can cause injuries. They can happen to people of all ages. There are many things you can do to make your home safe and to help prevent falls. What can I do on the outside of my home?  Regularly fix the edges of walkways and driveways and fix any cracks.  Remove anything that might make you trip as you walk through a door, such as a raised step or threshold.  Trim any bushes or trees on the path to your home.  Use bright outdoor lighting.  Clear any walking paths of anything that might make someone trip, such as rocks or tools.  Regularly check to see if handrails are loose or broken. Make sure that both sides of any steps have handrails.  Any raised decks and porches should have guardrails on the  edges.  Have any leaves, snow, or ice cleared regularly.  Use sand or salt on walking paths during winter.  Clean up any spills in your garage right away. This includes oil or grease spills. What can I do in the bathroom?  Use night lights.  Install grab bars by the toilet and in the tub and shower. Do not use towel bars as grab bars.  Use non-skid mats or decals in the tub or shower.  If you need to sit down in the shower, use a plastic, non-slip stool.  Keep the floor dry. Clean up any water that spills on the floor as soon as it happens.  Remove soap buildup in the tub or shower regularly.  Attach bath mats securely with double-sided non-slip rug tape.  Do not have throw rugs and other things on the floor that can make you trip. What can I do in the bedroom?  Use night lights.  Make sure that you have a light by your bed that is easy to reach.  Do not use any sheets or blankets that are too big for your bed. They should not hang down onto the floor.  Have a firm chair that has side arms. You can use this for support while you get dressed.  Do not have throw rugs and  other things on the floor that can make you trip. What can I do in the kitchen?  Clean up any spills right away.  Avoid walking on wet floors.  Keep items that you use a lot in easy-to-reach places.  If you need to reach something above you, use a strong step stool that has a grab bar.  Keep electrical cords out of the way.  Do not use floor polish or wax that makes floors slippery. If you must use wax, use non-skid floor wax.  Do not have throw rugs and other things on the floor that can make you trip. What can I do with my stairs?  Do not leave any items on the stairs.  Make sure that there are handrails on both sides of the stairs and use them. Fix handrails that are broken or loose. Make sure that handrails are as long as the stairways.  Check any carpeting to make sure that it is firmly  attached to the stairs. Fix any carpet that is loose or worn.  Avoid having throw rugs at the top or bottom of the stairs. If you do have throw rugs, attach them to the floor with carpet tape.  Make sure that you have a light switch at the top of the stairs and the bottom of the stairs. If you do not have them, ask someone to add them for you. What else can I do to help prevent falls?  Wear shoes that:  Do not have high heels.  Have rubber bottoms.  Are comfortable and fit you well.  Are closed at the toe. Do not wear sandals.  If you use a stepladder:  Make sure that it is fully opened. Do not climb a closed stepladder.  Make sure that both sides of the stepladder are locked into place.  Ask someone to hold it for you, if possible.  Clearly mark and make sure that you can see:  Any grab bars or handrails.  First and last steps.  Where the edge of each step is.  Use tools that help you move around (mobility aids) if they are needed. These include:  Canes.  Walkers.  Scooters.  Crutches.  Turn on the lights when you go into a dark area. Replace any light bulbs as soon as they burn out.  Set up your furniture so you have a clear path. Avoid moving your furniture around.  If any of your floors are uneven, fix them.  If there are any pets around you, be aware of where they are.  Review your medicines with your doctor. Some medicines can make you feel dizzy. This can increase your chance of falling. Ask your doctor what other things that you can do to help prevent falls. This information is not intended to replace advice given to you by your health care provider. Make sure you discuss any questions you have with your health care provider. Document Released: 05/25/2009 Document Revised: 01/04/2016 Document Reviewed: 09/02/2014 Elsevier Interactive Patient Education  2017 [name].

## 2019-12-03 DIAGNOSIS — R269 Unspecified abnormalities of gait and mobility: Secondary | ICD-10-CM | POA: Diagnosis not present

## 2019-12-03 DIAGNOSIS — M25572 Pain in left ankle and joints of left foot: Secondary | ICD-10-CM | POA: Diagnosis not present

## 2019-12-06 DIAGNOSIS — R269 Unspecified abnormalities of gait and mobility: Secondary | ICD-10-CM | POA: Diagnosis not present

## 2019-12-06 DIAGNOSIS — S8262XD Displaced fracture of lateral malleolus of left fibula, subsequent encounter for closed fracture with routine healing: Secondary | ICD-10-CM | POA: Diagnosis not present

## 2019-12-06 DIAGNOSIS — M25572 Pain in left ankle and joints of left foot: Secondary | ICD-10-CM | POA: Diagnosis not present

## 2019-12-06 DIAGNOSIS — M7672 Peroneal tendinitis, left leg: Secondary | ICD-10-CM | POA: Diagnosis not present

## 2019-12-07 ENCOUNTER — Other Ambulatory Visit: Payer: Self-pay | Admitting: Internal Medicine

## 2019-12-07 ENCOUNTER — Other Ambulatory Visit: Payer: Self-pay | Admitting: Nurse Practitioner

## 2019-12-09 DIAGNOSIS — R269 Unspecified abnormalities of gait and mobility: Secondary | ICD-10-CM | POA: Diagnosis not present

## 2019-12-09 DIAGNOSIS — M25572 Pain in left ankle and joints of left foot: Secondary | ICD-10-CM | POA: Diagnosis not present

## 2019-12-13 DIAGNOSIS — M25572 Pain in left ankle and joints of left foot: Secondary | ICD-10-CM | POA: Diagnosis not present

## 2019-12-13 DIAGNOSIS — R269 Unspecified abnormalities of gait and mobility: Secondary | ICD-10-CM | POA: Diagnosis not present

## 2019-12-20 DIAGNOSIS — S8262XD Displaced fracture of lateral malleolus of left fibula, subsequent encounter for closed fracture with routine healing: Secondary | ICD-10-CM | POA: Diagnosis not present

## 2019-12-20 DIAGNOSIS — H401134 Primary open-angle glaucoma, bilateral, indeterminate stage: Secondary | ICD-10-CM | POA: Diagnosis not present

## 2019-12-21 DIAGNOSIS — R269 Unspecified abnormalities of gait and mobility: Secondary | ICD-10-CM | POA: Diagnosis not present

## 2019-12-21 DIAGNOSIS — M25572 Pain in left ankle and joints of left foot: Secondary | ICD-10-CM | POA: Diagnosis not present

## 2019-12-23 DIAGNOSIS — R269 Unspecified abnormalities of gait and mobility: Secondary | ICD-10-CM | POA: Diagnosis not present

## 2019-12-23 DIAGNOSIS — M25572 Pain in left ankle and joints of left foot: Secondary | ICD-10-CM | POA: Diagnosis not present

## 2019-12-29 DIAGNOSIS — S8262XD Displaced fracture of lateral malleolus of left fibula, subsequent encounter for closed fracture with routine healing: Secondary | ICD-10-CM | POA: Diagnosis not present

## 2020-01-03 DIAGNOSIS — R269 Unspecified abnormalities of gait and mobility: Secondary | ICD-10-CM | POA: Diagnosis not present

## 2020-01-03 DIAGNOSIS — M25572 Pain in left ankle and joints of left foot: Secondary | ICD-10-CM | POA: Diagnosis not present

## 2020-01-06 DIAGNOSIS — M25572 Pain in left ankle and joints of left foot: Secondary | ICD-10-CM | POA: Diagnosis not present

## 2020-01-06 DIAGNOSIS — R269 Unspecified abnormalities of gait and mobility: Secondary | ICD-10-CM | POA: Diagnosis not present

## 2020-01-07 DIAGNOSIS — S8262XD Displaced fracture of lateral malleolus of left fibula, subsequent encounter for closed fracture with routine healing: Secondary | ICD-10-CM | POA: Diagnosis not present

## 2020-01-13 DIAGNOSIS — R269 Unspecified abnormalities of gait and mobility: Secondary | ICD-10-CM | POA: Diagnosis not present

## 2020-01-13 DIAGNOSIS — M25572 Pain in left ankle and joints of left foot: Secondary | ICD-10-CM | POA: Diagnosis not present

## 2020-01-14 ENCOUNTER — Other Ambulatory Visit: Payer: Self-pay | Admitting: Internal Medicine

## 2020-01-14 NOTE — Telephone Encounter (Signed)
Must schedule visit, then can have #30 only

## 2020-01-19 ENCOUNTER — Other Ambulatory Visit: Payer: Self-pay | Admitting: Orthopedic Surgery

## 2020-01-19 DIAGNOSIS — M25572 Pain in left ankle and joints of left foot: Secondary | ICD-10-CM | POA: Diagnosis not present

## 2020-01-19 DIAGNOSIS — S8262XD Displaced fracture of lateral malleolus of left fibula, subsequent encounter for closed fracture with routine healing: Secondary | ICD-10-CM | POA: Diagnosis not present

## 2020-01-20 DIAGNOSIS — M25572 Pain in left ankle and joints of left foot: Secondary | ICD-10-CM | POA: Diagnosis not present

## 2020-01-20 DIAGNOSIS — R269 Unspecified abnormalities of gait and mobility: Secondary | ICD-10-CM | POA: Diagnosis not present

## 2020-01-21 ENCOUNTER — Other Ambulatory Visit: Payer: Self-pay

## 2020-01-21 ENCOUNTER — Encounter: Payer: Self-pay | Admitting: Internal Medicine

## 2020-01-21 ENCOUNTER — Ambulatory Visit (INDEPENDENT_AMBULATORY_CARE_PROVIDER_SITE_OTHER): Payer: PPO | Admitting: Internal Medicine

## 2020-01-21 VITALS — BP 160/100 | HR 72 | Temp 98.8°F | Ht 64.0 in | Wt 137.0 lb

## 2020-01-21 DIAGNOSIS — K219 Gastro-esophageal reflux disease without esophagitis: Secondary | ICD-10-CM | POA: Diagnosis not present

## 2020-01-21 DIAGNOSIS — Z0000 Encounter for general adult medical examination without abnormal findings: Secondary | ICD-10-CM | POA: Diagnosis not present

## 2020-01-21 DIAGNOSIS — F320 Major depressive disorder, single episode, mild: Secondary | ICD-10-CM

## 2020-01-21 DIAGNOSIS — S8265XS Nondisplaced fracture of lateral malleolus of left fibula, sequela: Secondary | ICD-10-CM

## 2020-01-21 MED ORDER — BUSPIRONE HCL 5 MG PO TABS: 5.0000 mg | ORAL_TABLET | Freq: Two times a day (BID) | ORAL | 3 refills | Status: DC | PRN

## 2020-01-21 MED ORDER — SERTRALINE HCL 100 MG PO TABS: ORAL_TABLET | ORAL | 3 refills | Status: DC

## 2020-01-21 NOTE — Assessment & Plan Note (Signed)
Flu shot due next season. Covid-19 complete. Pneumonia complete. Shingrix complete. Tetanus up to date. Colonoscopy up to date, will age out before due again. Mammogram due 2022, pap smear aged out and dexa due 2022. Counseled about sun safety and mole surveillance. Counseled about the dangers of distracted driving. Given 10 year screening recommendations.

## 2020-01-21 NOTE — Assessment & Plan Note (Signed)
Doing well overall. Still has plenty of buspar but refill sent to pharmacy in case they expire. Refill zoloft 100 mg daily which is helping.

## 2020-01-21 NOTE — Patient Instructions (Addendum)
Health Maintenance, Female Adopting a healthy lifestyle and getting preventive care are important in promoting health and wellness. Ask your health care provider about:  The right schedule for you to have regular tests and exams.  Things you can do on your own to prevent diseases and keep yourself healthy. What should I know about diet, weight, and exercise? Eat a healthy diet   Eat a diet that includes plenty of vegetables, fruits, low-fat dairy products, and lean protein.  Do not eat a lot of foods that are high in solid fats, added sugars, or sodium. Maintain a healthy weight Body mass index (BMI) is used to identify weight problems. It estimates body fat based on height and weight. Your health care provider can help determine your BMI and help you achieve or maintain a healthy weight. Get regular exercise Get regular exercise. This is one of the most important things you can do for your health. Most adults should:  Exercise for at least 150 minutes each week. The exercise should increase your heart rate and make you sweat (moderate-intensity exercise).  Do strengthening exercises at least twice a week. This is in addition to the moderate-intensity exercise.  Spend less time sitting. Even light physical activity can be beneficial. Watch cholesterol and blood lipids Have your blood tested for lipids and cholesterol at 79 years of age, then have this test every 5 years. Have your cholesterol levels checked more often if:  Your lipid or cholesterol levels are high.  You are older than 79 years of age.  You are at high risk for heart disease. What should I know about cancer screening? Depending on your health history and family history, you may need to have cancer screening at various ages. This may include screening for:  Breast cancer.  Cervical cancer.  Colorectal cancer.  Skin cancer.  Lung cancer. What should I know about heart disease, diabetes, and high blood  pressure? Blood pressure and heart disease  High blood pressure causes heart disease and increases the risk of stroke. This is more likely to develop in people who have high blood pressure readings, are of African descent, or are overweight.  Have your blood pressure checked: ? Every 3-5 years if you are 18-39 years of age. ? Every year if you are 40 years old or older. Diabetes Have regular diabetes screenings. This checks your fasting blood sugar level. Have the screening done:  Once every three years after age 40 if you are at a normal weight and have a low risk for diabetes.  More often and at a younger age if you are overweight or have a high risk for diabetes. What should I know about preventing infection? Hepatitis B If you have a higher risk for hepatitis B, you should be screened for this virus. Talk with your health care provider to find out if you are at risk for hepatitis B infection. Hepatitis C Testing is recommended for:  Everyone born from 1945 through 1965.  Anyone with known risk factors for hepatitis C. Sexually transmitted infections (STIs)  Get screened for STIs, including gonorrhea and chlamydia, if: ? You are sexually active and are younger than 79 years of age. ? You are older than 79 years of age and your health care provider tells you that you are at risk for this type of infection. ? Your sexual activity has changed since you were last screened, and you are at increased risk for chlamydia or gonorrhea. Ask your health care provider if   you are at risk.  Ask your health care provider about whether you are at high risk for HIV. Your health care provider may recommend a prescription medicine to help prevent HIV infection. If you choose to take medicine to prevent HIV, you should first get tested for HIV. You should then be tested every 3 months for as long as you are taking the medicine. Pregnancy  If you are about to stop having your period (premenopausal) and  you may become pregnant, seek counseling before you get pregnant.  Take 400 to 800 micrograms (mcg) of folic acid every day if you become pregnant.  Ask for birth control (contraception) if you want to prevent pregnancy. Osteoporosis and menopause Osteoporosis is a disease in which the bones lose minerals and strength with aging. This can result in bone fractures. If you are 65 years old or older, or if you are at risk for osteoporosis and fractures, ask your health care provider if you should:  Be screened for bone loss.  Take a calcium or vitamin D supplement to lower your risk of fractures.  Be given hormone replacement therapy (HRT) to treat symptoms of menopause. Follow these instructions at home: Lifestyle  Do not use any products that contain nicotine or tobacco, such as cigarettes, e-cigarettes, and chewing tobacco. If you need help quitting, ask your health care provider.  Do not use street drugs.  Do not share needles.  Ask your health care provider for help if you need support or information about quitting drugs. Alcohol use  Do not drink alcohol if: ? Your health care provider tells you not to drink. ? You are pregnant, may be pregnant, or are planning to become pregnant.  If you drink alcohol: ? Limit how much you use to 0-1 drink a day. ? Limit intake if you are breastfeeding.  Be aware of how much alcohol is in your drink. In the [place] one drink equals one 12 oz bottle of beer (355 mL), one 5 oz glass of wine (148 mL), or one 1 oz glass of hard liquor (44 mL). General instructions  Schedule regular health, dental, and eye exams.  Stay current with your vaccines.  Tell your health care provider if: ? You often feel depressed. ? You have ever been abused or do not feel safe at home. Summary  Adopting a healthy lifestyle and getting preventive care are important in promoting health and wellness.  Follow your health care provider's instructions about healthy  diet, exercising, and getting tested or screened for diseases.  Follow your health care provider's instructions on monitoring your cholesterol and blood pressure. This information is not intended to replace advice given to you by your health care provider. Make sure you discuss any questions you have with your health care provider. Document Revised: 07/22/2018 Document Reviewed: 07/22/2018 Elsevier Patient Education  2020 Elsevier Inc.  

## 2020-01-21 NOTE — Progress Notes (Signed)
   Subjective:   Patient ID: Linda [name], female    DOB: [birth year], 79 y.o.   MRN: [id number]  HPI The patient is a 79 YO female coming in for physical. Still struggling a lot with ankle fracture and surgery in January and awaiting CT scan to see if she needs another surgery.  PMH, [facility], social history reviewed and updated  Review of Systems  Constitutional: Negative.   HENT: Negative.   Eyes: Negative.   Respiratory: Negative for cough, chest tightness and shortness of breath.   Cardiovascular: Negative for chest pain, palpitations and leg swelling.  Gastrointestinal: Negative for abdominal distention, abdominal pain, constipation, diarrhea, nausea and vomiting.  Musculoskeletal: Positive for joint swelling and myalgias.  Skin: Negative.   Neurological: Negative.   Psychiatric/Behavioral: Negative.     Objective:  Physical Exam Constitutional:      Appearance: She is well-developed.  HENT:     Head: Normocephalic and atraumatic.  Cardiovascular:     Rate and Rhythm: Normal rate and regular rhythm.  Pulmonary:     Effort: Pulmonary effort is normal. No respiratory distress.     Breath sounds: Normal breath sounds. No wheezing or rales.  Abdominal:     General: Bowel sounds are normal. There is no distension.     Palpations: Abdomen is soft.     Tenderness: There is no abdominal tenderness. There is no rebound.  Musculoskeletal:        General: Tenderness present.     Cervical back: Normal range of motion.     Comments: Tenderness in the left ankle and red patch of skin over the ankle laterally  Skin:    General: Skin is warm and dry.  Neurological:     Mental Status: She is alert and oriented to person, place, and time.     Coordination: Coordination normal.     Vitals:   01/21/20 1020  BP: (!) 160/100  Pulse: 72  Temp: 98.8 F (37.1 C)  TempSrc: Oral  SpO2: 96%  Weight: 137 lb (62.1 kg)  Height: 5\' 4"  (1.626 m)    This visit occurred during the  SARS-CoV-2 public health emergency.  Safety protocols were in place, including screening questions prior to the visit, additional usage of staff PPE, and extensive cleaning of exam room while observing appropriate contact time as indicated for disinfecting solutions.   Assessment & Plan:

## 2020-01-21 NOTE — Assessment & Plan Note (Signed)
Awaiting CT scan to assess need for additional surgery.

## 2020-01-21 NOTE — Assessment & Plan Note (Signed)
Recent EGD and taking omeprazole. Well controlled.

## 2020-01-26 NOTE — Telephone Encounter (Signed)
ERROR

## 2020-02-03 ENCOUNTER — Ambulatory Visit
Admission: RE | Admit: 2020-02-03 | Discharge: 2020-02-03 | Disposition: A | Discharge status: Home / Self Care | Payer: PPO | Source: Ambulatory Visit | Attending: Orthopedic Surgery | Admitting: Orthopedic Surgery

## 2020-02-03 DIAGNOSIS — M7989 Other specified soft tissue disorders: Secondary | ICD-10-CM | POA: Diagnosis not present

## 2020-02-03 DIAGNOSIS — M25572 Pain in left ankle and joints of left foot: Secondary | ICD-10-CM

## 2020-02-04 DIAGNOSIS — S8262XD Displaced fracture of lateral malleolus of left fibula, subsequent encounter for closed fracture with routine healing: Secondary | ICD-10-CM | POA: Diagnosis not present

## 2020-02-10 DIAGNOSIS — L920 Granuloma annulare: Secondary | ICD-10-CM | POA: Diagnosis not present

## 2020-02-29 DIAGNOSIS — M25572 Pain in left ankle and joints of left foot: Secondary | ICD-10-CM | POA: Diagnosis not present

## 2020-02-29 DIAGNOSIS — R269 Unspecified abnormalities of gait and mobility: Secondary | ICD-10-CM | POA: Diagnosis not present

## 2020-03-02 DIAGNOSIS — H401131 Primary open-angle glaucoma, bilateral, mild stage: Secondary | ICD-10-CM | POA: Diagnosis not present

## 2020-03-02 DIAGNOSIS — Z961 Presence of intraocular lens: Secondary | ICD-10-CM | POA: Diagnosis not present

## 2020-03-02 DIAGNOSIS — H52203 Unspecified astigmatism, bilateral: Secondary | ICD-10-CM | POA: Diagnosis not present

## 2020-03-03 DIAGNOSIS — S8262XD Displaced fracture of lateral malleolus of left fibula, subsequent encounter for closed fracture with routine healing: Secondary | ICD-10-CM | POA: Diagnosis not present

## 2020-03-09 DIAGNOSIS — R269 Unspecified abnormalities of gait and mobility: Secondary | ICD-10-CM | POA: Diagnosis not present

## 2020-03-09 DIAGNOSIS — M25572 Pain in left ankle and joints of left foot: Secondary | ICD-10-CM | POA: Diagnosis not present

## 2020-03-10 DIAGNOSIS — Z5181 Encounter for therapeutic drug level monitoring: Secondary | ICD-10-CM | POA: Diagnosis not present

## 2020-03-10 DIAGNOSIS — L920 Granuloma annulare: Secondary | ICD-10-CM | POA: Diagnosis not present

## 2020-03-16 ENCOUNTER — Other Ambulatory Visit: Payer: Self-pay | Admitting: Nurse Practitioner

## 2020-03-24 DIAGNOSIS — M25572 Pain in left ankle and joints of left foot: Secondary | ICD-10-CM | POA: Diagnosis not present

## 2020-03-24 DIAGNOSIS — R269 Unspecified abnormalities of gait and mobility: Secondary | ICD-10-CM | POA: Diagnosis not present

## 2020-03-28 DIAGNOSIS — M25572 Pain in left ankle and joints of left foot: Secondary | ICD-10-CM | POA: Diagnosis not present

## 2020-03-28 DIAGNOSIS — R269 Unspecified abnormalities of gait and mobility: Secondary | ICD-10-CM | POA: Diagnosis not present

## 2020-03-30 DIAGNOSIS — Z1231 Encounter for screening mammogram for malignant neoplasm of breast: Secondary | ICD-10-CM | POA: Diagnosis not present

## 2020-04-07 DIAGNOSIS — R269 Unspecified abnormalities of gait and mobility: Secondary | ICD-10-CM | POA: Diagnosis not present

## 2020-04-07 DIAGNOSIS — M25572 Pain in left ankle and joints of left foot: Secondary | ICD-10-CM | POA: Diagnosis not present

## 2020-04-22 ENCOUNTER — Other Ambulatory Visit: Payer: Self-pay | Admitting: Nurse Practitioner

## 2020-05-09 DIAGNOSIS — G894 Chronic pain syndrome: Secondary | ICD-10-CM | POA: Diagnosis not present

## 2020-05-30 ENCOUNTER — Other Ambulatory Visit: Payer: Self-pay | Admitting: Nurse Practitioner

## 2020-05-31 DIAGNOSIS — L820 Inflamed seborrheic keratosis: Secondary | ICD-10-CM | POA: Diagnosis not present

## 2020-05-31 DIAGNOSIS — L309 Dermatitis, unspecified: Secondary | ICD-10-CM | POA: Diagnosis not present

## 2020-05-31 DIAGNOSIS — Z79899 Other long term (current) drug therapy: Secondary | ICD-10-CM | POA: Diagnosis not present

## 2020-05-31 DIAGNOSIS — L920 Granuloma annulare: Secondary | ICD-10-CM | POA: Diagnosis not present

## 2020-07-03 ENCOUNTER — Other Ambulatory Visit: Payer: Self-pay | Admitting: Nurse Practitioner

## 2020-07-19 DIAGNOSIS — L920 Granuloma annulare: Secondary | ICD-10-CM | POA: Diagnosis not present

## 2020-07-19 DIAGNOSIS — B351 Tinea unguium: Secondary | ICD-10-CM | POA: Diagnosis not present

## 2020-07-24 DIAGNOSIS — H401134 Primary open-angle glaucoma, bilateral, indeterminate stage: Secondary | ICD-10-CM | POA: Diagnosis not present

## 2020-07-27 ENCOUNTER — Other Ambulatory Visit: Payer: Self-pay

## 2020-07-27 ENCOUNTER — Encounter: Payer: Self-pay | Admitting: Internal Medicine

## 2020-07-27 ENCOUNTER — Ambulatory Visit (INDEPENDENT_AMBULATORY_CARE_PROVIDER_SITE_OTHER): Payer: PPO | Admitting: Internal Medicine

## 2020-07-27 VITALS — BP 150/78 | HR 75 | Temp 97.9°F | Ht 64.0 in | Wt 134.0 lb

## 2020-07-27 DIAGNOSIS — R5383 Other fatigue: Secondary | ICD-10-CM

## 2020-07-27 LAB — LIPID PANEL
Cholesterol: 185 mg/dL (ref 0–200)
HDL: 61.2 mg/dL (ref >39.00)
LDL Cholesterol: 101 mg/dL — ABNORMAL HIGH (ref 0–99)
NonHDL: 124.08
Total CHOL/HDL Ratio: 3
Triglycerides: 115 mg/dL (ref 0.0–149.0)
VLDL: 23 mg/dL (ref 0.0–40.0)

## 2020-07-27 LAB — COMPREHENSIVE METABOLIC PANEL
ALT: 13 U/L (ref 0–35)
AST: 18 U/L (ref 0–37)
Albumin: 4.5 g/dL (ref 3.5–5.2)
Alkaline Phosphatase: 85 U/L (ref 39–117)
BUN: 15 mg/dL (ref 6–23)
CO2: 27 mEq/L (ref 19–32)
Calcium: 9.4 mg/dL (ref 8.4–10.5)
Chloride: 102 mEq/L (ref 96–112)
Creatinine, Ser: 0.76 mg/dL (ref 0.40–1.20)
GFR: 74.26 mL/min (ref >60.00)
Glucose, Bld: 117 mg/dL — ABNORMAL HIGH (ref 70–99)
Potassium: 3.7 mEq/L (ref 3.5–5.1)
Sodium: 135 mEq/L (ref 135–145)
Total Bilirubin: 0.4 mg/dL (ref 0.2–1.2)
Total Protein: 7.3 g/dL (ref 6.0–8.3)

## 2020-07-27 LAB — CBC
HCT: 39.2 % (ref 36.0–46.0)
Hemoglobin: 13 g/dL (ref 12.0–15.0)
MCHC: 33.2 g/dL (ref 30.0–36.0)
MCV: 88.1 fl (ref 78.0–100.0)
Platelets: 220 10*3/uL (ref 150.0–400.0)
RBC: 4.45 Mil/uL (ref 3.87–5.11)
RDW: 14.6 % (ref 11.5–15.5)
WBC: 7.7 10*3/uL (ref 4.0–10.5)

## 2020-07-27 LAB — FERRITIN: Ferritin: 45.9 ng/mL (ref 10.0–291.0)

## 2020-07-27 LAB — VITAMIN D 25 HYDROXY (VIT D DEFICIENCY, FRACTURES): VITD: 20.54 ng/mL — ABNORMAL LOW (ref 30.00–100.00)

## 2020-07-27 LAB — VITAMIN B12: Vitamin B-12: 586 pg/mL (ref 211–911)

## 2020-07-27 LAB — TSH: TSH: 1.39 u[IU]/mL (ref 0.35–4.50)

## 2020-07-27 NOTE — Patient Instructions (Signed)
We will check the blood work today. ° ° °

## 2020-07-27 NOTE — Progress Notes (Signed)
   Subjective:   Patient ID: Linda [name], female    DOB: [birth year], 79 y.o.   MRN: [id number]  HPI The patient is a 79 YO female coming in for concerns about fatigue. She has recently moved and did quite a lot physically and emotionally with the move. She has pulled some muscles which are finally starting to mend. She is just not having as much energy as usual. She is used to doing all the time and is lacking the get up and go. She denies sleeping problems. Falls asleep quickly and wakes refreshed but taking a nap most days lately which is not usual for her. Denies night sweats or weight change.   Review of Systems  Constitutional: Positive for fatigue.  HENT: Negative.   Eyes: Negative.   Respiratory: Negative for cough, chest tightness and shortness of breath.   Cardiovascular: Negative for chest pain, palpitations and leg swelling.  Gastrointestinal: Negative for abdominal distention, abdominal pain, constipation, diarrhea, nausea and vomiting.  Musculoskeletal: Negative.   Skin: Negative.   Neurological: Negative.   Psychiatric/Behavioral: Negative.     Objective:  Physical Exam Constitutional:      Appearance: She is well-developed and well-nourished.  HENT:     Head: Normocephalic and atraumatic.  Eyes:     Extraocular Movements: EOM normal.  Cardiovascular:     Rate and Rhythm: Normal rate and regular rhythm.  Pulmonary:     Effort: Pulmonary effort is normal. No respiratory distress.     Breath sounds: Normal breath sounds. No wheezing or rales.  Abdominal:     General: Bowel sounds are normal. There is no distension.     Palpations: Abdomen is soft.     Tenderness: There is no abdominal tenderness. There is no rebound.  Musculoskeletal:        General: No edema.     Cervical back: Normal range of motion.  Skin:    General: Skin is warm and dry.  Neurological:     Mental Status: She is alert and oriented to person, place, and time.     Coordination:  Coordination normal.  Psychiatric:        Mood and Affect: Mood and affect normal.     Vitals:   07/27/20 1419  BP: (!) 150/78  Pulse: 75  Temp: 97.9 F (36.6 C)  TempSrc: Oral  SpO2: 96%  Weight: 134 lb (60.8 kg)  Height: 5\' 4"  (1.626 m)    This visit occurred during the SARS-CoV-2 public health emergency.  Safety protocols were in place, including screening questions prior to the visit, additional usage of staff PPE, and extensive cleaning of exam room while observing appropriate contact time as indicated for disinfecting solutions.   Assessment & Plan:

## 2020-07-28 ENCOUNTER — Encounter: Payer: Self-pay | Admitting: Internal Medicine

## 2020-07-28 DIAGNOSIS — R5383 Other fatigue: Secondary | ICD-10-CM | POA: Insufficient documentation

## 2020-07-28 NOTE — Assessment & Plan Note (Signed)
Checking labs including CBC, CMP, thyroid, B12, vitamin D to evaluate for cause. Treat as appropriate.

## 2020-08-17 ENCOUNTER — Other Ambulatory Visit: Payer: Self-pay | Admitting: Nurse Practitioner

## 2020-09-11 DIAGNOSIS — S52131A Displaced fracture of neck of right radius, initial encounter for closed fracture: Secondary | ICD-10-CM | POA: Diagnosis not present

## 2020-09-11 DIAGNOSIS — G5603 Carpal tunnel syndrome, bilateral upper limbs: Secondary | ICD-10-CM | POA: Diagnosis not present

## 2020-09-11 DIAGNOSIS — M25521 Pain in right elbow: Secondary | ICD-10-CM | POA: Insufficient documentation

## 2020-09-12 DIAGNOSIS — M25572 Pain in left ankle and joints of left foot: Secondary | ICD-10-CM | POA: Diagnosis not present

## 2020-09-12 DIAGNOSIS — R269 Unspecified abnormalities of gait and mobility: Secondary | ICD-10-CM | POA: Diagnosis not present

## 2020-09-15 ENCOUNTER — Telehealth: Payer: Self-pay | Admitting: Internal Medicine

## 2020-09-15 NOTE — Progress Notes (Signed)
  Chronic Care Management   Note  09/15/2020 Name: Linda [name] MRN: [id number] DOB: Jun 21, 1941  Linda [name] is a 80 y.o. year old female who is a primary care patient of Insiya, Oshea, MD. I reached out to Curlene Labrum by phone today in response to a referral sent by Ms. Ree Shay Catalina's PCP, Hoyt Koch, MD.   Ms. Botkin was given information about Chronic Care Management services today including:  1. CCM service includes personalized support from designated clinical staff supervised by her physician, including individualized plan of care and coordination with other care providers 2. 24/7 contact phone numbers for assistance for urgent and routine care needs. 3. Service will only be billed when office clinical staff spend 20 minutes or more in a month to coordinate care. 4. Only one practitioner may furnish and bill the service in a calendar month. 5. The patient may stop CCM services at any time (effective at the end of the month) by phone call to the office staff.   Patient agreed to services and verbal consent obtained.   Follow up plan:   Carley Perdue [employer] Scheduler

## 2020-09-18 ENCOUNTER — Other Ambulatory Visit: Payer: Self-pay | Admitting: Nurse Practitioner

## 2020-09-26 ENCOUNTER — Encounter: Payer: Self-pay | Admitting: Internal Medicine

## 2020-09-28 ENCOUNTER — Encounter: Payer: Self-pay | Admitting: Podiatry

## 2020-09-28 ENCOUNTER — Ambulatory Visit (INDEPENDENT_AMBULATORY_CARE_PROVIDER_SITE_OTHER): Payer: PPO

## 2020-09-28 ENCOUNTER — Ambulatory Visit: Payer: PPO | Admitting: Podiatry

## 2020-09-28 ENCOUNTER — Other Ambulatory Visit: Payer: Self-pay

## 2020-09-28 DIAGNOSIS — G5792 Unspecified mononeuropathy of left lower limb: Secondary | ICD-10-CM

## 2020-09-28 DIAGNOSIS — M7750 Other enthesopathy of unspecified foot: Secondary | ICD-10-CM

## 2020-09-28 DIAGNOSIS — M778 Other enthesopathies, not elsewhere classified: Secondary | ICD-10-CM

## 2020-09-28 DIAGNOSIS — M7752 Other enthesopathy of left foot: Secondary | ICD-10-CM

## 2020-09-28 MED ORDER — TRIAMCINOLONE ACETONIDE 40 MG/ML IJ SUSP
20.0000 mg | Freq: Once | INTRAMUSCULAR | Status: AC
  Administered 2020-09-28: 20 mg

## 2020-10-01 NOTE — Progress Notes (Signed)
Subjective:  Patient ID: Linda [name], female    DOB: March 28, 1941,  MRN: [id number] HPI Chief Complaint  Patient presents with  . Foot Pain    Plantar forefoot left - fx ankle last year, wore cast post op, since has had discomfort, tip of hallux reddish discoloration, 1st, 2nd, 3rd toes left and they stay cold all the time, worse in cold weather  . New Patient (Initial Visit)    80 y.o. female presents with the above complaint.   ROS: Denies fever chills nausea vomiting muscle aches pains calf pain back pain chest pain shortness of breath.  Past Medical History:  Diagnosis Date  . ALLERGIC RHINITIS   . Allergy   . ANEMIA-IRON DEFICIENCY   . Anxiety state, unspecified   . Carpal tunnel syndrome    pt denies   . Cataract   . Closed left ankle fracture   . COLONIC POLYPS, HX OF 2007  . GERD   . GLAUCOMA   . Glaucoma   . GOITER, MULTINODULAR    on [place] 2006, unchanged 6/13 with nodules all <47mm  . HIATAL HERNIA WITH REFLUX   . Hip dysplasia, acquired    B sx; eval at [facility] for same 01/2015 -ongoing PT  . OSTEOARTHRITIS   . Osteoporosis   . TOBACCO USE, QUIT    Past Surgical History:  Procedure Laterality Date  . BREAST SURGERY  1960   Left breast cyst removed no complications  . CARPAL TUNNEL RELEASE Right   . COLONOSCOPY  2005  . EYE SURGERY  1991-1992   both eyes  . EYE SURGERY Left 2014   shunt behind left eye  . ORIF FIBULA FRACTURE Left 08/24/2019   Procedure: OPEN REDUCTION INTERNAL FIXATION (ORIF) LEFT DISTAL FIBULA FRACTURE;  Surgeon: Marchia Bond, MD;  Location: [place];  Service: Orthopedics;  Laterality: Left;  . Right shoulder aurgery  10/2008    Current Outpatient Medications:  .  acetaminophen (TYLENOL) 650 MG CR tablet, Take 1,300 mg by mouth 2 (two) times daily., Disp: , Rfl:  .  b complex vitamins tablet, Take 1 tablet by mouth daily., Disp: , Rfl:  .  Biotin 10000 MCG TABS, Take by mouth., Disp: , Rfl:  .  busPIRone (BUSPAR) 5 MG  tablet, Take 1 tablet (5 mg total) by mouth 2 (two) times daily as needed., Disp: 90 tablet, Rfl: 3 .  ciclopirox (PENLAC) 8 % solution, , Disp: , Rfl:  .  clobetasol cream (TEMOVATE) 0.05 %, Apply topically., Disp: , Rfl:  .  dorzolamide-timolol (COSOPT) 22.3-6.8 MG/ML ophthalmic solution, Place 1 drop into both eyes 2 (two) times daily., Disp: , Rfl:  .  FLUZONE HIGH-DOSE QUADRIVALENT 0.7 ML SUSY, , Disp: , Rfl:  .  gabapentin (NEURONTIN) 300 MG capsule, Take 2 capsules by mouth at bedtime. , Disp: , Rfl:  .  hydroxychloroquine (PLAQUENIL) 200 MG tablet, hydroxychloroquine 200 mg tablet, Disp: , Rfl:  .  omeprazole (PRILOSEC) 40 MG capsule, TAKE (1) CAPSULE DAILY., Disp: 90 capsule, Rfl: 0 .  sertraline (ZOLOFT) 100 MG tablet, TAKE 1 TABLET EACH DAY., Disp: 90 tablet, Rfl: 3 .  triamcinolone ointment (KENALOG) 0.1 %, SMARTSIG:1 Application Topical 2-3 Times Daily, Disp: , Rfl:   No Known Allergies Review of Systems Objective:  There were no vitals filed for this visit.  General: Well developed, nourished, in no acute distress, alert and oriented x3   Dermatological: Skin is warm, dry and supple bilateral. Nails x 10 are well  maintained; remaining integument appears unremarkable at this time. There are no open sores, no preulcerative lesions, no rash or signs of infection present.  Vascular: Dorsalis Pedis artery and Posterior Tibial artery pedal pulses are 2/4 bilateral with immedate capillary fill time. Pedal hair growth present. No varicosities and no lower extremity edema present bilateral.   Neruologic: Grossly intact via light touch bilateral. Vibratory intact via tuning fork bilateral. Protective threshold with Semmes Wienstein monofilament intact to all pedal sites bilateral. Patellar and Achilles deep tendon reflexes 2+ bilateral. No Babinski or clonus noted bilateral. Neuritis type symptoms to the dorsal aspect of the foot including the medial cutaneous nerve and deep peroneal  nerve.  Musculoskeletal: No gross boney pedal deformities bilateral. No pain, crepitus, or limitation noted with foot and ankle range of motion bilateral. Muscular strength 5/5 in all groups tested bilateral. She has pain and tenderness on palpation of the second metatarsophalangeal joint of the left foot. She has no pain on palpation of the ankle. Pain on palpation of the second metatarsophalangeal joint particularly on end range of motion and plantarly.  Gait: Unassisted, Nonantalgic.    Radiographs:  Radiographs were not taken today may be taken next time.  Assessment & Plan:   Assessment: Capsulitis and neuritis possibly associated with casting.  Plan: Discussed etiology pathology conservative versus surgical therapies. At this point I injected the second metatarsophalangeal joint with 10 mg of Kenalog 5 mg Marcaine after sterile Betadine skin prep. She tolerated procedure well without complications of left follow-up with her in about 1 month to 6 weeks.     Bishop Vanderwerf T[place], [place]

## 2020-10-09 ENCOUNTER — Telehealth: Payer: Self-pay | Admitting: Pharmacist

## 2020-10-09 NOTE — Progress Notes (Signed)
Chronic Care Management Pharmacy Assistant   Name: Linda [name]  MRN: [id number] DOB: April 08, 1941  Reason for Encounter: Initial Questions   PCP : Hoyt Koch, MD  Allergies:  No Known Allergies  Medications: Outpatient Encounter Medications as of 10/09/2020  Medication Sig  . acetaminophen (TYLENOL) 650 MG CR tablet Take 1,300 mg by mouth 2 (two) times daily.  Marland Kitchen b complex vitamins tablet Take 1 tablet by mouth daily.  . Biotin 10000 MCG TABS Take by mouth.  . busPIRone (BUSPAR) 5 MG tablet Take 1 tablet (5 mg total) by mouth 2 (two) times daily as needed.  . ciclopirox (PENLAC) 8 % solution   . clobetasol cream (TEMOVATE) 0.05 % Apply topically.  . dorzolamide-timolol (COSOPT) 22.3-6.8 MG/ML ophthalmic solution Place 1 drop into both eyes 2 (two) times daily.  Marland Kitchen FLUZONE HIGH-DOSE QUADRIVALENT 0.7 ML SUSY   . gabapentin (NEURONTIN) 300 MG capsule Take 2 capsules by mouth at bedtime.   . hydroxychloroquine (PLAQUENIL) 200 MG tablet hydroxychloroquine 200 mg tablet  . omeprazole (PRILOSEC) 40 MG capsule TAKE (1) CAPSULE DAILY.  Marland Kitchen sertraline (ZOLOFT) 100 MG tablet TAKE 1 TABLET EACH DAY.  Marland Kitchen triamcinolone ointment (KENALOG) 0.1 % SMARTSIG:1 Application Topical 2-3 Times Daily   No facility-administered encounter medications on file as of 10/09/2020.    Current Diagnosis: Patient Active Problem List   Diagnosis Date Noted  . Pain in joint of right elbow 09/11/2020  . Other fatigue 07/28/2020  . Left fibular fracture 08/24/2019  . Encounter for orthopedic follow-up care 02/08/2019  . Bilateral hand pain 10/21/2018  . Dupuytren's disease of palm 01/19/2018  . Pain in right hand 01/19/2018  . Pain in joint of left shoulder 09/17/2017  . Bilateral sensorineural hearing loss 03/12/2016  . Routine general medical examination at a health care facility 07/22/2015  . Hip dysplasia, acquired   . Acetabular labrum tear, right, initial encounter 01/25/2015  .  Osteoarthritis of both hips resulting from hip dysplasia 01/25/2015  . Primary osteoarthritis of right hip 01/25/2015  . Anemia 11/06/2012  . Anxiety 11/06/2012  . Depression 11/06/2012  . Cataract, nuclear 10/28/2012  . Primary open angle glaucoma of both eyes, indeterminate stage 10/28/2012  . Ptosis of eyelid 10/28/2012  . Bladder prolapse, female, acquired 01/09/2012  . MDD (major depressive disorder), single episode, mild ([place]) 05/29/2010  . GLAUCOMA 12/14/2009  . GERD (gastroesophageal reflux disease) 05/21/2007  . Osteoarthritis 05/21/2007    Goals Addressed   None     Follow-Up:  Pharmacist Review   Have you seen any other providers since your last visit? The patient states that she last saw the podiatrist on 09/28/20 and he was not helpful to her.   Any changes in your medications or health? The patient states that has been no changes in her medication  Any side effects from any medications? The patient states that she does not have any side effects to medications   Do you have an symptoms or problems not managed by your medications? The patient states that she does not have any symptoms or problems not managed by medications  Any concerns about your health right now? The patient states that she does not have an concerns about her health right now  Has your provider asked that you check blood pressure, blood sugar, or follow special diet at home? The patient does not check her blood pressure or have a special diet  Do you get any type of exercise on a regular basis?  The patient states that she does water aerobics and walking  Can you think of a goal you would like to reach for your health? The patient states that she would like to maintain her health  Do you have any problems getting your medications? The patient states that she does not have any problems with getting medication from pharmacy  Is there anything that you would like to discuss during the appointment? The  patient states that she would like to discuss coming off of gabapentin, does not feel she needs it anymore.  Patient would like a televisit call, asked to have medications and supplements at appointment   Wendy Poet, [place] [phone]   Times spent:

## 2020-10-12 ENCOUNTER — Other Ambulatory Visit: Payer: Self-pay

## 2020-10-12 ENCOUNTER — Ambulatory Visit (INDEPENDENT_AMBULATORY_CARE_PROVIDER_SITE_OTHER): Payer: Medicare HMO | Admitting: Pharmacist

## 2020-10-12 DIAGNOSIS — N3281 Overactive bladder: Secondary | ICD-10-CM

## 2020-10-12 DIAGNOSIS — N819 Female genital prolapse, unspecified: Secondary | ICD-10-CM

## 2020-10-12 DIAGNOSIS — K219 Gastro-esophageal reflux disease without esophagitis: Secondary | ICD-10-CM

## 2020-10-12 DIAGNOSIS — M8580 Other specified disorders of bone density and structure, unspecified site: Secondary | ICD-10-CM

## 2020-10-12 DIAGNOSIS — F320 Major depressive disorder, single episode, mild: Secondary | ICD-10-CM

## 2020-10-12 DIAGNOSIS — M162 Bilateral osteoarthritis resulting from hip dysplasia: Secondary | ICD-10-CM

## 2020-10-12 DIAGNOSIS — R69 Illness, unspecified: Secondary | ICD-10-CM | POA: Diagnosis not present

## 2020-10-12 NOTE — Progress Notes (Signed)
Chronic Care Management Pharmacy Note  10/12/2020 Name:  Linda [name] MRN:  [id number] DOB:  09/05/1940  Subjective: Linda [name] is an 80 y.o. year old female who is a primary patient of Hoyt Koch, MD.  The CCM team was consulted for assistance with disease management and care coordination needs.    Engaged with patient by telephone for initial visit in response to provider referral for pharmacy case management and/or care coordination services.   Consent to Services:  The patient was given the following information about Chronic Care Management services today, agreed to services, and gave verbal consent: 1. CCM service includes personalized support from designated clinical staff supervised by the primary care provider, including individualized plan of care and coordination with other care providers 2. 24/7 contact phone numbers for assistance for urgent and routine care needs. 3. Service will only be billed when office clinical staff spend 20 minutes or more in a month to coordinate care. 4. Only one practitioner may furnish and bill the service in a calendar month. 5.The patient may stop CCM services at any time (effective at the end of the month) by phone call to the office staff. 6. The patient will be responsible for cost sharing (co-pay) of up to 20% of the service fee (after annual deductible is met). Patient agreed to services and consent obtained.  Patient Care Team: Hoyt Koch, MD as PCP - General (Internal Medicine) Melida Quitter, MD as Consulting Physician Princess Bruins, MD (Obstetrics and Gynecology) Suella Broad, MD (Physical Medicine and Rehabilitation) Bond, Tracie Harrier, MD (Ophthalmology) Katy Apo, MD (Ophthalmology) Kristeen Miss, MD as Consulting Physician (Neurosurgery) Pyrtle, Lajuan Lines, MD as Consulting Physician (Gastroenterology) Ignatius Specking (Dentistry) Charlton Haws, [facility] as Pharmacist (Pharmacist)  Patient moved into new  condo in November 2021. Husband of 32 years passed away, pt has been more anxious ever since.  Recent office visits: 07/27/20 Dr Sharlet Salina OV: chronic f/u, c/o fatigue. Labs stable, no changes made. VitD 20.5  Recent consult visits: 09/28/20 Dr Milinda Pointer (podiatry): eval for tendonitis, neuritis, hx ankle fx last year. Given steroid injection in toe.  07/24/20 Dr Edilia Bo (eye center): f/u glaucoma  07/19/20 Dr Delman Cheadle (dermatology): f/u granuloma, tinea unguium  05/11/20 [facility] audiology: f/u hearing loss  Hospital visits: None in previous 6 months  Objective:  Lab Results  Component Value Date   CREATININE 0.76 07/27/2020   BUN 15 07/27/2020   GFR 74.26 07/27/2020   GFRNONAA 85.23 02/27/2010   NA 135 07/27/2020   K 3.7 07/27/2020   CALCIUM 9.4 07/27/2020   CO2 27 07/27/2020    Lab Results  Component Value Date/Time   GFR 74.26 07/27/2020 02:49 PM   GFR 98.31 09/07/2019 10:57 AM    Last diabetic Eye exam: No results found for: HMDIABEYEEXA  Last diabetic Foot exam: No results found for: HMDIABFOOTEX   Lab Results  Component Value Date   CHOL 185 07/27/2020   HDL 61.20 07/27/2020   LDLCALC 101 (H) 07/27/2020   LDLDIRECT 111.0 11/24/2017   TRIG 115.0 07/27/2020   CHOLHDL 3 07/27/2020    Hepatic Function Latest Ref Rng & Units 07/27/2020 09/07/2019 08/25/2018  Total Protein 6.0 - 8.3 g/dL 7.3 7.0 7.1  Albumin 3.5 - 5.2 g/dL 4.5 4.3 4.6  AST 0 - 37 U/L 18 14 16   ALT 0 - 35 U/L 13 12 13   Alk Phosphatase 39 - 117 U/L 85 100 59  Total Bilirubin 0.2 - 1.2 mg/dL 0.4 0.5 0.4  Bilirubin, Direct 0.0 - 0.3 mg/dL - - -    Lab Results  Component Value Date/Time   TSH 1.39 07/27/2020 02:49 PM   TSH 1.08 08/22/2016 04:04 PM    CBC Latest Ref Rng & Units 07/27/2020 09/07/2019 08/25/2018  WBC 4.0 - 10.5 K/uL 7.7 10.3 7.4  Hemoglobin 12.0 - 15.0 g/dL 13.0 12.4 12.8  Hematocrit 36.0 - 46.0 % 39.2 36.9 38.6  Platelets 150.0 - 400.0 K/uL 220.0 330.0 213.0    Lab Results  Component  Value Date/Time   VD25OH 20.54 (L) 07/27/2020 02:49 PM    Clinical ASCVD: No  The ASCVD Risk score Mikey Bussing DC Jr., et al., 2013) failed to calculate for the following reasons:   The 2013 ASCVD risk score is only valid for ages 19 to 63    Depression screen PHQ 2/9 07/27/2020 12/01/2019 11/26/2018  Decreased Interest 0 0 0  Down, Depressed, Hopeless 0 0 1  PHQ - 2 Score 0 0 1  Altered sleeping 0 - 0  Tired, decreased energy 0 - 0  Change in appetite 0 - 0  Feeling bad or failure about yourself  0 - 0  Trouble concentrating 0 - 0  Moving slowly or fidgety/restless 0 - 0  Suicidal thoughts 0 - 0  PHQ-9 Score 0 - 1  Difficult doing work/chores Not difficult at all - Not difficult at all    GAD 7 : Generalized Anxiety Score 08/25/2018  Nervous, Anxious, on Edge 2  Control/stop worrying 1  Worry too much - different things 0  Trouble relaxing 0  Restless 0  Easily annoyed or irritable 1  Afraid - awful might happen 1  Total GAD 7 Score 5    Social History   Tobacco Use  Smoking Status Former Smoker  . Quit date: 08/12/1988  . Years since quitting: 32.1  Smokeless Tobacco Never Used   BP Readings from Last 3 Encounters:  07/27/20 (!) 150/78  01/21/20 (!) 160/100  12/01/19 118/70   Pulse Readings from Last 3 Encounters:  07/27/20 75  01/21/20 72  12/01/19 76   Wt Readings from Last 3 Encounters:  07/27/20 134 lb (60.8 kg)  01/21/20 137 lb (62.1 kg)  12/01/19 139 lb (63 kg)    Assessment/Interventions: Review of patient past medical history, allergies, medications, health status, including review of consultants reports, laboratory and other test data, was performed as part of comprehensive evaluation and provision of chronic care management services.   SDOH:  (Social Determinants of Health) assessments and interventions performed: Yes SDOH Interventions   Flowsheet Row Most Recent Value  SDOH Interventions   Financial Strain Interventions Intervention Not Indicated       CCM Care Plan  No Known Allergies  Medications Reviewed Today    Reviewed by Charlton Haws, [place] (Pharmacist) on 10/12/20 at 31  Med List Status: <None>  Medication Order Taking? Sig Documenting Provider Last Dose Status Informant  acetaminophen (TYLENOL) 650 MG CR tablet [id number] Yes Take 1,300 mg by mouth 2 (two) times daily. [provider] Taking Active   b complex vitamins tablet [id number] Yes Take 1 tablet by mouth daily. [provider] Taking Active   Biotin 10000 MCG TABS [id number]  Take by mouth. [provider]  Active   busPIRone (BUSPAR) 5 MG tablet [id number] Yes Take 1 tablet (5 mg total) by mouth 2 (two) times daily as needed. Hoyt Koch, MD Taking Active   Calcium Carb-Cholecalciferol (CALCIUM-VITAMIN D3) 600-400 MG-UNIT TABS [id number] Yes  Take by mouth. [provider] Taking Active   ciclopirox (PENLAC) 8 % solution [id number] Yes  [provider] Taking Active   clobetasol cream (TEMOVATE) 0.05 % [id number] Yes Apply topically. [provider] Taking Active   dorzolamide-timolol (COSOPT) 22.3-6.8 MG/ML ophthalmic solution [id number] Yes Place 1 drop into both eyes 2 (two) times daily. [provider] Taking Active   omeprazole (PRILOSEC) 40 MG capsule [id number] Yes TAKE (1) CAPSULE DAILY. Noralyn Pick, NP Taking Active   sertraline (ZOLOFT) 100 MG tablet [id number] Yes TAKE 1 TABLET EACH DAY. Hoyt Koch, MD Taking Active   triamcinolone ointment (KENALOG) 0.1 % [id number] Yes SMARTSIG:1 Application Topical 2-3 Times Daily [provider] Taking Active           Patient Active Problem List   Diagnosis Date Noted  . Pain in joint of right elbow 09/11/2020  . Other fatigue 07/28/2020  . Left fibular fracture 08/24/2019  . Encounter for orthopedic follow-up care 02/08/2019  . Bilateral hand pain 10/21/2018  . Dupuytren's disease of palm 01/19/2018  . Pain in  right hand 01/19/2018  . Pain in joint of left shoulder 09/17/2017  . Bilateral sensorineural hearing loss 03/12/2016  . Routine general medical examination at a health care facility 07/22/2015  . Hip dysplasia, acquired   . Acetabular labrum tear, right, initial encounter 01/25/2015  . Osteoarthritis of both hips resulting from hip dysplasia 01/25/2015  . Primary osteoarthritis of right hip 01/25/2015  . Anemia 11/06/2012  . Anxiety 11/06/2012  . Depression 11/06/2012  . Cataract, nuclear 10/28/2012  . Primary open angle glaucoma of both eyes, indeterminate stage 10/28/2012  . Ptosis of eyelid 10/28/2012  . Bladder prolapse, female, acquired 01/09/2012  . MDD (major depressive disorder), single episode, mild ([place]) 05/29/2010  . GLAUCOMA 12/14/2009  . GERD (gastroesophageal reflux disease) 05/21/2007  . Osteoarthritis 05/21/2007    Immunization History  Administered Date(s) Administered  . Influenza Split 05/12/2012  . Influenza Whole 05/12/2009, 05/29/2010  . Influenza, High Dose Seasonal PF 06/12/2013, 06/05/2016, 05/27/2018, 07/17/2020  . Influenza-Unspecified 05/12/2014, 05/27/2015, 05/12/2017, 05/28/2018  . PFIZER Comirnaty(Gray Top)Covid-19 Tri-Sucrose Vaccine 10/05/2019, 10/26/2019, 05/22/2020  . Pneumococcal Conjugate-13 08/10/2013  . Pneumococcal Polysaccharide-23 07/12/2011  . Td 01/13/2004  . Tdap 02/23/2014  . Zoster 01/27/2008, 05/12/2009  . Zoster Recombinat (Shingrix) 03/01/2019, 03/01/2019    Conditions to be addressed/monitored:  GERD, Depression, Anxiety, Osteopenia, Osteoarthritis, Overactive Bladder and Dermatitis   Care Plan : CCM Pharmacy Care Plan  Updates made by Charlton Haws, [place] since 10/12/2020 12:00 AM    Problem: GERD, Depression, Anxiety, Osteopenia, Osteoarthritis, Overactive Bladder and Dermatitis   Priority: High    Long-Range Goal: Disease management   Start Date: 10/12/2020  Expected End Date: 10/12/2021  This Visit's Progress: On  track  Priority: High  Note:   Current Barriers:  . Unable to independently monitor therapeutic efficacy . Suboptimal therapeutic regimen for Osteopenia  Pharmacist Clinical Goal(s):  Marland Kitchen Over the next 180 days, patient will achieve adherence to monitoring guidelines and medication adherence to achieve therapeutic efficacy . adhere to plan to optimize therapeutic regimen for osteopenia as evidenced by report of adherence to recommended medication management changes through collaboration with PharmD and provider.   Interventions: . 1:1 collaboration with Hoyt Koch, MD regarding development and update of comprehensive plan of care as evidenced by provider attestation and co-signature . Inter-disciplinary care team collaboration (see longitudinal plan of care) . Comprehensive medication review performed; medication list updated in electronic  medical record  Depression/Anxiety (Goal: manage symptoms)  -Controlled -Current treatment: . Sertraline 100 mg daily . Buspirone 5 mg BID  - pt usually does not take PM dose -Medications previously tried/failed: none -PHQ9: 0 (07/2020) -GAD7: 5 (08/2018) -Connected with PCP for mental health support -Educated on duration of action of buspirone (half life 2-3 hrs);  -Recommended to start taking PM dose of buspirone, especially if she finds herself more anxious in the evening  GERD (Goal: manage symptoms) -Controlled - per pt GI had instructed her to increase PPI to 40 mg after endoscopy results. Follows with Dr Hilarie Fredrickson -Current treatment  . Omeprazole 40 mg daily -Educated on bone density risks associated with chronic omeprazole use -Recommended to continue current medication  Osteoarthritis (Goal: manage pain) -Controlled - recently weaned herself off of gabapentin, pt reports she has been off for a week and reports no worsening symptoms. She reports pain is controlled -pt does water aerobics, walks, and sleeps well at night -Current  treatment  . Tylenol 650 mg - 2 tab HS -Medications previously tried: gabapentin (self-weaned)  -Recommended to continue current medication  Osteopenia (Goal prevent fractures) -Not ideally controlled - not taking calcium or vitamin D. She is also at increased risk for low bone density due to chronic PPI use. -Last DEXA Scan: 2015 - osteopenia -Patient is not a candidate for pharmacologic treatment -Current treatment  . No medications -Recommend [phone] units of vitamin D daily. Recommend 1200 mg of calcium daily from dietary and supplemental sources.  Glaucoma (Goal: prevent vision loss) -Controlled -managed per Dr Edilia Bo -Current treatment  . Dorzolamide-timolol (Cosopt) eye drops BID -Recommended to continue current medication  Dermatitis (Goal: manage symptoms) -Controlled -follows with dermatology (Dr Delman Cheadle) -pt reports itchiness/redness started after breaking her ankle last year and irritation began around cast. She reports symptoms are controlled with PRN use of topical steroids. Of note she was prescribed hydroxychloroquine by dermatologist but did not continue it -Current treatment  . Triamcinolone 0.1% ointment PRN . Clobetasol 0.05% cream PRN -Medications previously tried: hydroxychloroquine -Recommended to continue current medication  Bladder issues (Goal: manage symptoms) -Not ideally controlled - pt reports increase in urinary frequency, denies dysuria.  -Current treatment  . No medications -Counseled on available medications for overactive bladder. Pt would like to discuss issues with PCP and possible urology referral  Health Maintenance -Vaccine gaps: none. Updated immunization history. -Pfizer vaccine: 10/05/19, 10/26/19, 05/22/20 -Flu shot 07/17/20 @ [facility] -Current therapy:  Marland Kitchen Vitamin B complex . Biotin 10,000 mcg  . Ciclopirox 8% solution (nail fungus) -Patient is satisfied with current therapy and denies issues -Recommended to continue current  medication  Patient Goals/Self-Care Activities . Over the next 180 days, patient will:  - take medications as prescribed focus on medication adherence by pill box Start Calcium-Vitamin D supplement  Follow Up Plan: Telephone follow up appointment with care management team member scheduled for: 6 months      Medication Assistance: None required.  Patient affirms current coverage meets needs.  Patient's preferred pharmacy is:  [facility], [place] [address] Phone: [phone] Fax: [phone]  Uses pill box? Yes Pt endorses 100% compliance  We discussed: Current pharmacy is preferred with insurance plan and patient is satisfied with pharmacy services Patient decided to: Continue current medication management strategy  Care Plan and Follow Up Patient Decision:  Patient agrees to Care Plan and Follow-up.  Plan: Telephone follow  up appointment with care management team member scheduled for:  6 months  Charlene Brooke, PharmD, [facility] Clinical Pharmacist [facility] [phone]

## 2020-10-12 NOTE — Patient Instructions (Addendum)
Visit Information  Phone number for Pharmacist: [phone]  Thank you for meeting with me to discuss your medications! I look forward to working with you to achieve your health care goals. Below is a summary of what we talked about during the visit:  Goals Addressed            This Visit's Progress   . Manage My Medicine       Timeframe:  Long-Range Goal Priority:  Medium Start Date:        10/12/20                     Expected End Date:    10/12/21                   Follow Up Date 04/14/21   - call for medicine refill 2 or 3 days before it runs out - call if I am sick and can't take my medicine - keep a list of all the medicines I take; vitamins and herbals too - use a pillbox to sort medicine  -Start taking Calcium-Vitamin D to keep bones healthy -Start taking buspirone twice a day since it wears off after a few hours   Why is this important?   . These steps will help you keep on track with your medicines.   Notes:       Patient Care Plan: CCM Pharmacy Care Plan    Problem Identified: GERD, Depression, Anxiety, Osteopenia, Osteoarthritis, Overactive Bladder and Dermatitis   Priority: High    Long-Range Goal: Disease management   Start Date: 10/12/2020  Expected End Date: 10/12/2021  This Visit's Progress: On track  Priority: High  Note:   Current Barriers:  . Unable to independently monitor therapeutic efficacy . Suboptimal therapeutic regimen for Osteopenia  Pharmacist Clinical Goal(s):  Marland Kitchen Over the next 180 days, patient will achieve adherence to monitoring guidelines and medication adherence to achieve therapeutic efficacy . adhere to plan to optimize therapeutic regimen for osteopenia as evidenced by report of adherence to recommended medication management changes through collaboration with PharmD and provider.   Interventions: . 1:1 collaboration with Hoyt Koch, MD regarding development and update of comprehensive plan of care as evidenced by provider  attestation and co-signature . Inter-disciplinary care team collaboration (see longitudinal plan of care) . Comprehensive medication review performed; medication list updated in electronic medical record  Depression/Anxiety (Goal: manage symptoms)  -Controlled -Current treatment: . Sertraline 100 mg daily . Buspirone 5 mg BID  - pt usually does not take PM dose -Medications previously tried/failed: none -PHQ9: 0 (07/2020) -GAD7: 5 (08/2018) -Connected with PCP for mental health support -Educated on duration of action of buspirone (half life 2-3 hrs);  -Recommended to start taking PM dose of buspirone, especially if she finds herself more anxious in the evening  GERD (Goal: manage symptoms) -Controlled - per pt GI had instructed her to increase PPI to 40 mg after endoscopy results. Follows with Dr Hilarie Fredrickson -Current treatment  . Omeprazole 40 mg daily -Educated on bone density risks associated with chronic omeprazole use -Recommended to continue current medication  Osteoarthritis (Goal: manage pain) -Controlled - recently weaned herself off of gabapentin, pt reports she has been off for a week and reports no worsening symptoms. She reports pain is controlled -pt does water aerobics, walks, and sleeps well at night -Current treatment  . Tylenol 650 mg - 2 tab HS -Medications previously tried: gabapentin (self-weaned)  -Recommended to continue current medication  Osteopenia (Goal prevent fractures) -Not ideally controlled - not taking calcium or vitamin D. She is also at increased risk for low bone density due to chronic PPI use. -Last DEXA Scan: 2015 - osteopenia -Patient is not a candidate for pharmacologic treatment -Current treatment  . No medications -Recommend [phone] units of vitamin D daily. Recommend 1200 mg of calcium daily from dietary and supplemental sources.  Glaucoma (Goal: prevent vision loss) -Controlled -managed per Dr Edilia Bo -Current treatment   . Dorzolamide-timolol (Cosopt) eye drops BID -Recommended to continue current medication  Dermatitis (Goal: manage symptoms) -Controlled -follows with dermatology (Dr Delman Cheadle) -pt reports itchiness/redness started after breaking her ankle last year and irritation began around cast. She reports symptoms are controlled with PRN use of topical steroids. Of note she was prescribed hydroxychloroquine by dermatologist but did not continue it -Current treatment  . Triamcinolone 0.1% ointment PRN . Clobetasol 0.05% cream PRN -Medications previously tried: hydroxychloroquine -Recommended to continue current medication  Bladder issues (Goal: manage symptoms) -Not ideally controlled - pt reports increase in urinary frequency, denies dysuria.  -Current treatment  . No medications -Counseled on available medications for overactive bladder. Pt would like to discuss issues with PCP and possible urology referral  Health Maintenance -Vaccine gaps: none. Updated immunization history. -Pfizer vaccine: 10/05/19, 10/26/19, 05/22/20 -Flu shot 07/17/20 @ [facility] -Current therapy:  Marland Kitchen Vitamin B complex . Biotin 10,000 mcg  . Ciclopirox 8% solution (nail fungus) -Patient is satisfied with current therapy and denies issues -Recommended to continue current medication  Patient Goals/Self-Care Activities . Over the next 180 days, patient will:  - take medications as prescribed focus on medication adherence by pill box Start Calcium-Vitamin D supplement  Follow Up Plan: Telephone follow up appointment with care management team member scheduled for: 6 months      Ms. Bartoszek was given information about Chronic Care Management services today including:  1. CCM service includes personalized support from designated clinical staff supervised by her physician, including individualized plan of care and coordination with other care providers 2. 24/7 contact phone numbers for assistance for urgent and routine care  needs. 3. Standard insurance, coinsurance, copays and deductibles apply for chronic care management only during months in which we provide at least 20 minutes of these services. Most insurances cover these services at 100%, however patients may be responsible for any copay, coinsurance and/or deductible if applicable. This service may help you avoid the need for more expensive face-to-face services. 4. Only one practitioner may furnish and bill the service in a calendar month. 5. The patient may stop CCM services at any time (effective at the end of the month) by phone call to the office staff.  Patient agreed to services and verbal consent obtained.   The patient verbalized understanding of instructions, educational materials, and care plan provided today and agreed to receive a mailed copy of patient instructions, educational materials, and care plan.  Telephone follow up appointment with pharmacy team member scheduled for: 6 months  Charlene Brooke, PharmD, BCACP Clinical Pharmacist [facility] [place] protect organs, store calcium, anchor muscles, and support the whole body. Keeping your bones strong is important, especially as you get older. You can take actions to help keep your bones strong and healthy. Why is keeping my bones healthy important? Keeping your bones healthy is important because your body constantly replaces bone cells. Cells get old, and new cells take their place. As we age, we lose bone cells  because the body may not be able to make enough new cells to replace the old cells. The amount of bone cells and bone tissue you have is referred to as bone mass. The higher your bone mass, the stronger your bones. The aging process leads to an overall loss of bone mass in the body, which can increase the likelihood of:  Joint pain and stiffness.  Broken bones.  A condition in which the bones become weak and brittle  (osteoporosis). A large decline in bone mass occurs in older adults. In women, it occurs about the time of menopause.   What actions can I take to keep my bones healthy? Good health habits are important for maintaining healthy bones. This includes eating nutritious foods and exercising regularly. To have healthy bones, you need to get enough of the right minerals and vitamins. Most nutrition experts recommend getting these nutrients from the foods that you eat. In some cases, taking supplements may also be recommended. Doing certain types of exercise is also important for bone health. What are the nutritional recommendations for healthy bones? Eating a well-balanced diet with plenty of calcium and vitamin D will help to protect your bones. Nutritional recommendations vary from person to person. Ask your health care provider what is healthy for you. Here are some general guidelines. Get enough calcium Calcium is the most important (essential) mineral for bone health. Most people can get enough calcium from their diet, but supplements may be recommended for people who are at risk for osteoporosis. Good sources of calcium include:  Dairy products, such as low-fat or nonfat milk, cheese, and yogurt.  Dark green leafy vegetables, such as bok choy and broccoli.  Calcium-fortified foods, such as orange juice, cereal, bread, soy beverages, and tofu products.  Nuts, such as almonds. Follow these recommended amounts for daily calcium intake:  Children, age 62-3: 700 mg.  Children, age 49-8: 1,000 mg.  Children, age 24-13: 1,300 mg.  Teens, age 37-18: 1,300 mg.  Adults, age 621-50: 1,000 mg.  Adults, age 624-70: ? Men: 1,000 mg. ? Women: 1,200 mg.  Adults, age 491 or older: 1,200 mg.  Pregnant and breastfeeding females: ? Teens: 1,300 mg. ? Adults: 1,000 mg. Get enough vitamin D Vitamin D is the most essential vitamin for bone health. It helps the body absorb calcium. Sunlight stimulates the  skin to make vitamin D, so be sure to get enough sunlight. If you live in a cold climate or you do not get outside often, your health care provider may recommend that you take vitamin D supplements. Good sources of vitamin D in your diet include:  Egg yolks.  Saltwater fish.  Milk and cereal fortified with vitamin D. Follow these recommended amounts for daily vitamin D intake:  Children and teens, age 62-18: 600 international units.  Adults, age 620 or younger: 400-800 international units.  Adults, age 497 or older: 800-1,000 international units. Get other important nutrients Other nutrients that are important for bone health include:  Phosphorus. This mineral is found in meat, poultry, dairy foods, nuts, and legumes. The recommended daily intake for adult men and adult women is 700 mg.  Magnesium. This mineral is found in seeds, nuts, dark green vegetables, and legumes. The recommended daily intake for adult men is 400-420 mg. For adult women, it is 310-320 mg.  Vitamin K. This vitamin is found in green leafy vegetables. The recommended daily intake is 120 mg for adult men and 90 mg for adult women.  What type of physical activity is best for building and maintaining healthy bones? Weight-bearing and strength-building activities are important for building and maintaining healthy bones. Weight-bearing activities cause muscles and bones to work against gravity. Strength-building activities increase the strength of the muscles that support bones. Weight-bearing and muscle-building activities include:  Walking and hiking.  Jogging and running.  Dancing.  Gym exercises.  Lifting weights.  Tennis and racquetball.  Climbing stairs.  Aerobics. Adults should get at least 30 minutes of moderate physical activity on most days. Children should get at least 60 minutes of moderate physical activity on most days. Ask your health care provider what type of exercise is best for you.   How can  I find out if my bone mass is low? Bone mass can be measured with an X-ray test called a bone mineral density (BMD) test. This test is recommended for all women who are age 44 or older. It may also be recommended for:  Men who are age 61 or older.  People who are at risk for osteoporosis because of: ? Having bones that break easily. ? Having a long-term disease that weakens bones, such as kidney disease or rheumatoid arthritis. ? Having menopause earlier than normal. ? Taking medicine that weakens bones, such as steroids, thyroid hormones, or hormone treatment for breast cancer or prostate cancer. ? Smoking. ? Drinking three or more alcoholic drinks a day. If you find that you have a low bone mass, you may be able to prevent osteoporosis or further bone loss by changing your diet and lifestyle. Where can I find more information? For more information, check out the following websites:  [place]: AviationTales.fr  [employer] of Health: www.bones.SouthExposed.es  International Osteoporosis Foundation: Administrator.iofbonehealth.org Summary  The aging process leads to an overall loss of bone mass in the body, which can increase the likelihood of broken bones and osteoporosis.  Eating a well-balanced diet with plenty of calcium and vitamin D will help to protect your bones.  Weight-bearing and strength-building activities are also important for building and maintaining strong bones.  Bone mass can be measured with an X-ray test called a bone mineral density (BMD) test. This information is not intended to replace advice given to you by your health care provider. Make sure you discuss any questions you have with your health care provider. Document Revised: 08/25/2017 Document Reviewed: 08/25/2017 Elsevier Patient Education  2021 [name].

## 2020-10-26 ENCOUNTER — Encounter: Payer: Self-pay | Admitting: Podiatry

## 2020-10-26 ENCOUNTER — Other Ambulatory Visit: Payer: Self-pay

## 2020-10-26 ENCOUNTER — Ambulatory Visit (INDEPENDENT_AMBULATORY_CARE_PROVIDER_SITE_OTHER): Payer: Medicare HMO

## 2020-10-26 ENCOUNTER — Ambulatory Visit (INDEPENDENT_AMBULATORY_CARE_PROVIDER_SITE_OTHER): Payer: Medicare HMO | Admitting: Podiatry

## 2020-10-26 DIAGNOSIS — M778 Other enthesopathies, not elsewhere classified: Secondary | ICD-10-CM

## 2020-10-26 NOTE — Progress Notes (Signed)
She presents today states that the injection really did not help at all but I still feel like I got a wadded up sock underneath my forefoot.  She states that it feels that way underneath my toes.  Objective: Vital signs are stable she is alert oriented x3.  There is no erythema edema cellulitis drainage odor no signs of infection.  She has Olesen incurvated nail margin.  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  Plan: I expressed to her that we would trim her medial border of her nail or performed chemical matricectomy whenever she would like to but also that most likely her neuropraxia would remain and there is really nothing we could do for her.  She understands that and is amendable to it.

## 2020-11-16 DIAGNOSIS — L304 Erythema intertrigo: Secondary | ICD-10-CM | POA: Diagnosis not present

## 2020-12-01 ENCOUNTER — Ambulatory Visit (INDEPENDENT_AMBULATORY_CARE_PROVIDER_SITE_OTHER): Payer: Medicare HMO

## 2020-12-01 ENCOUNTER — Other Ambulatory Visit: Payer: Self-pay

## 2020-12-01 VITALS — BP 120/60 | HR 72 | Temp 97.8°F | Ht 64.0 in | Wt 133.4 lb

## 2020-12-01 DIAGNOSIS — Z0000 Encounter for general adult medical examination without abnormal findings: Secondary | ICD-10-CM

## 2020-12-01 NOTE — Patient Instructions (Signed)
Linda [name] , Thank you for taking time to come for your Medicare Wellness Visit. I appreciate your ongoing commitment to your health goals. Please review the following plan we discussed and let me know if I can assist you in the future.   Screening recommendations/referrals: Colonoscopy: no repeat due to age 80: 03/30/2020; due every year Bone Density: 03/10/2018; due every 2-3 years Recommended yearly ophthalmology/optometry visit for glaucoma screening and checkup Recommended yearly dental visit for hygiene and checkup  Vaccinations: Influenza vaccine: 07/17/2020 Pneumococcal vaccine: 07/12/2011, 08/10/2013 Tdap vaccine: 02/23/2014 Shingles vaccine: 03/01/2019, 06/01/2019 Covid-19: 10/05/2019, 10/26/2019, 05/22/2020  Advanced directives: Documents on file.  Conditions/risks identified: Yes; Reviewed health maintenance screenings with patient today and relevant education, vaccines, and/or referrals were provided. Please continue to do your personal lifestyle choices by: daily care of teeth and gums, regular physical activity (goal should be 5 days a week for 30 minutes), eat a healthy diet, avoid tobacco and drug use, limiting any alcohol intake, taking a low-dose aspirin (if not allergic or have been advised by your provider otherwise) and taking vitamins and minerals as recommended by your provider. Continue doing brain stimulating activities (puzzles, reading, adult coloring books, staying active) to keep memory Cochrane. Continue to eat heart healthy diet (full of fruits, vegetables, whole grains, lean protein, water--limit salt, fat, and sugar intake) and increase physical activity as tolerated.  Next appointment: Please schedule your next Medicare Wellness Visit with your Nurse Health Advisor in 1 year by calling [phone].   Preventive Care 80 Years and Older, Female Preventive care refers to lifestyle choices and visits with your health care provider that can promote health and  wellness. What does preventive care include?  A yearly physical exam. This is also called an annual well check.  Dental exams once or twice a year.  Routine eye exams. Ask your health care provider how often you should have your eyes checked.  Personal lifestyle choices, including:  Daily care of your teeth and gums.  Regular physical activity.  Eating a healthy diet.  Avoiding tobacco and drug use.  Limiting alcohol use.  Practicing safe sex.  Taking low-dose aspirin every day.  Taking vitamin and mineral supplements as recommended by your health care provider. What happens during an annual well check? The services and screenings done by your health care provider during your annual well check will depend on your age, overall health, lifestyle risk factors, and family history of disease. Counseling  Your health care provider may ask you questions about your:  Alcohol use.  Tobacco use.  Drug use.  Emotional well-being.  Home and relationship well-being.  Sexual activity.  Eating habits.  History of falls.  Memory and ability to understand (cognition).  Work and work Statistician.  Reproductive health. Screening  You may have the following tests or measurements:  Height, weight, and BMI.  Blood pressure.  Lipid and cholesterol levels. These may be checked every 5 years, or more frequently if you are over 30 years old.  Skin check.  Lung cancer screening. You may have this screening every year starting at age 80 if you have a 30-pack-year history of smoking and currently smoke or have quit within the past 15 years.  Fecal occult blood test (FOBT) of the stool. You may have this test every year starting at age 80.  Flexible sigmoidoscopy or colonoscopy. You may have a sigmoidoscopy every 5 years or a colonoscopy every 10 years starting at age 80.  Hepatitis C blood test.  Hepatitis B blood test.  Sexually transmitted disease (STD)  testing.  Diabetes screening. This is done by checking your blood sugar (glucose) after you have not eaten for a while (fasting). You may have this done every 1-3 years.  Bone density scan. This is done to screen for osteoporosis. You may have this done starting at age 64.  Mammogram. This may be done every 1-2 years. Talk to your health care provider about how often you should have regular mammograms. Talk with your health care provider about your test results, treatment options, and if necessary, the need for more tests. Vaccines  Your health care provider may recommend certain vaccines, such as:  Influenza vaccine. This is recommended every year.  Tetanus, diphtheria, and acellular pertussis (Tdap, Td) vaccine. You may need a Td booster every 10 years.  Zoster vaccine. You may need this after age 46.  Pneumococcal 13-valent conjugate (PCV13) vaccine. One dose is recommended after age 45.  Pneumococcal polysaccharide (PPSV23) vaccine. One dose is recommended after age 63. Talk to your health care provider about which screenings and vaccines you need and how often you need them. This information is not intended to replace advice given to you by your health care provider. Make sure you discuss any questions you have with your health care provider. Document Released: 08/25/2015 Document Revised: 04/17/2016 Document Reviewed: 05/30/2015 Elsevier Interactive Patient Education  2017 [employer] Prevention in the Home Falls can cause injuries. They can happen to people of all ages. There are many things you can do to make your home safe and to help prevent falls. What can I do on the outside of my home?  Regularly fix the edges of walkways and driveways and fix any cracks.  Remove anything that might make you trip as you walk through a door, such as a raised step or threshold.  Trim any bushes or trees on the path to your home.  Use bright outdoor lighting.  Clear any walking  paths of anything that might make someone trip, such as rocks or tools.  Regularly check to see if handrails are loose or broken. Make sure that both sides of any steps have handrails.  Any raised decks and porches should have guardrails on the edges.  Have any leaves, snow, or ice cleared regularly.  Use sand or salt on walking paths during winter.  Clean up any spills in your garage right away. This includes oil or grease spills. What can I do in the bathroom?  Use night lights.  Install grab bars by the toilet and in the tub and shower. Do not use towel bars as grab bars.  Use non-skid mats or decals in the tub or shower.  If you need to sit down in the shower, use a plastic, non-slip stool.  Keep the floor dry. Clean up any water that spills on the floor as soon as it happens.  Remove soap buildup in the tub or shower regularly.  Attach bath mats securely with double-sided non-slip rug tape.  Do not have throw rugs and other things on the floor that can make you trip. What can I do in the bedroom?  Use night lights.  Make sure that you have a light by your bed that is easy to reach.  Do not use any sheets or blankets that are too big for your bed. They should not hang down onto the floor.  Have a firm chair that has side arms. You can use this  for support while you get dressed.  Do not have throw rugs and other things on the floor that can make you trip. What can I do in the kitchen?  Clean up any spills right away.  Avoid walking on wet floors.  Keep items that you use a lot in easy-to-reach places.  If you need to reach something above you, use a strong step stool that has a grab bar.  Keep electrical cords out of the way.  Do not use floor polish or wax that makes floors slippery. If you must use wax, use non-skid floor wax.  Do not have throw rugs and other things on the floor that can make you trip. What can I do with my stairs?  Do not leave any items  on the stairs.  Make sure that there are handrails on both sides of the stairs and use them. Fix handrails that are broken or loose. Make sure that handrails are as long as the stairways.  Check any carpeting to make sure that it is firmly attached to the stairs. Fix any carpet that is loose or worn.  Avoid having throw rugs at the top or bottom of the stairs. If you do have throw rugs, attach them to the floor with carpet tape.  Make sure that you have a light switch at the top of the stairs and the bottom of the stairs. If you do not have them, ask someone to add them for you. What else can I do to help prevent falls?  Wear shoes that:  Do not have high heels.  Have rubber bottoms.  Are comfortable and fit you well.  Are closed at the toe. Do not wear sandals.  If you use a stepladder:  Make sure that it is fully opened. Do not climb a closed stepladder.  Make sure that both sides of the stepladder are locked into place.  Ask someone to hold it for you, if possible.  Clearly mark and make sure that you can see:  Any grab bars or handrails.  First and last steps.  Where the edge of each step is.  Use tools that help you move around (mobility aids) if they are needed. These include:  Canes.  Walkers.  Scooters.  Crutches.  Turn on the lights when you go into a dark area. Replace any light bulbs as soon as they burn out.  Set up your furniture so you have a clear path. Avoid moving your furniture around.  If any of your floors are uneven, fix them.  If there are any pets around you, be aware of where they are.  Review your medicines with your doctor. Some medicines can make you feel dizzy. This can increase your chance of falling. Ask your doctor what other things that you can do to help prevent falls. This information is not intended to replace advice given to you by your health care provider. Make sure you discuss any questions you have with your health care  provider. Document Released: 05/25/2009 Document Revised: 01/04/2016 Document Reviewed: 09/02/2014 Elsevier Interactive Patient Education  2017 [name].

## 2020-12-01 NOTE — Progress Notes (Signed)
Subjective:   Linda [name] is a 80 y.o. female who presents for Medicare Annual (Subsequent) preventive examination.  Review of Systems    No ROS. Medicare Wellness Visit. Additional risk factors are reflected in social history. Cardiac Risk Factors include: advanced age (>3men, >54 women);family history of premature cardiovascular disease;hypertension     Objective:    Today's Vitals   12/01/20 1446  BP: 120/60  Pulse: 72  Temp: 97.8 F (36.6 C)  SpO2: 97%  Weight: 133 lb 6.4 oz (60.5 kg)  Height: 5\' 4"  (1.626 m)  PainSc: 0-No pain   Body mass index is 22.9 kg/m.  Advanced Directives 12/01/2020 12/01/2019 08/24/2019 08/23/2019 08/20/2019 01/21/2019 11/26/2018  Does Patient Have a Medical Advance Directive? Yes Yes Yes Yes No Yes Yes  Type of Advance Directive - Living will;Healthcare Power of [place];Living will Living will;Healthcare Power of [place];Living will [place];Living will  Does patient want to make changes to medical advance directive? No - Patient declined No - Patient declined No - Patient declined No - Patient declined - - -  Copy of [place] in Chart? - No - copy requested No - copy requested No - copy requested - No - copy requested No - copy requested  Would patient like information on creating a medical advance directive? - - No - Patient declined - No - Patient declined - -    Current Medications (verified) Outpatient Encounter Medications as of 12/01/2020  Medication Sig  . acetaminophen (TYLENOL) 650 MG CR tablet Take 1,300 mg by mouth 2 (two) times daily.  Marland Kitchen b complex vitamins tablet Take 1 tablet by mouth daily.  . Biotin 10000 MCG TABS Take by mouth.  . busPIRone (BUSPAR) 5 MG tablet Take 1 tablet (5 mg total) by mouth 2 (two) times daily as needed.  . Calcium Carb-Cholecalciferol (CALCIUM-VITAMIN D3) 600-400 MG-UNIT TABS Take by mouth.  .  ciclopirox (PENLAC) 8 % solution   . clobetasol cream (TEMOVATE) 0.05 % Apply topically.  . dorzolamide-timolol (COSOPT) 22.3-6.8 MG/ML ophthalmic solution Place 1 drop into both eyes 2 (two) times daily.  Marland Kitchen omeprazole (PRILOSEC) 40 MG capsule TAKE (1) CAPSULE DAILY.  Marland Kitchen sertraline (ZOLOFT) 100 MG tablet TAKE 1 TABLET EACH DAY.  Marland Kitchen triamcinolone ointment (KENALOG) 0.1 % SMARTSIG:1 Application Topical 2-3 Times Daily   No facility-administered encounter medications on file as of 12/01/2020.    Allergies (verified) Patient has no known allergies.   History: Past Medical History:  Diagnosis Date  . ALLERGIC RHINITIS   . Allergy   . ANEMIA-IRON DEFICIENCY   . Anxiety state, unspecified   . Carpal tunnel syndrome    pt denies   . Cataract   . Closed left ankle fracture   . COLONIC POLYPS, HX OF 2007  . GERD   . GLAUCOMA   . Glaucoma   . GOITER, MULTINODULAR    on [place] 2006, unchanged 6/13 with nodules all <43mm  . HIATAL HERNIA WITH REFLUX   . Hip dysplasia, acquired    B sx; eval at [facility] for same 01/2015 -ongoing PT  . OSTEOARTHRITIS   . Osteoporosis   . TOBACCO USE, QUIT    Past Surgical History:  Procedure Laterality Date  . BREAST SURGERY  1960   Left breast cyst removed no complications  . CARPAL TUNNEL RELEASE Right   . COLONOSCOPY  2005  . EYE SURGERY  1991-1992   both  eyes  . EYE SURGERY Left 2014   shunt behind left eye  . ORIF FIBULA FRACTURE Left 08/24/2019   Procedure: OPEN REDUCTION INTERNAL FIXATION (ORIF) LEFT DISTAL FIBULA FRACTURE;  Surgeon: Marchia Bond, MD;  Location: [place];  Service: Orthopedics;  Laterality: Left;  . Right shoulder aurgery  10/2008   Family History  Problem Relation Age of Onset  . Diabetes Mother   . Heart disease Mother   . Arthritis Mother   . Hypertension Mother   . Arthritis Father   . Colon cancer Paternal Grandmother   . Esophageal cancer Neg Hx   . Rectal cancer Neg Hx   . Stomach cancer Neg Hx     Social History   Socioeconomic History  . Marital status: Widowed    Spouse name: Not on file  . Number of children: 2  . Years of education: Not on file  . Highest education level: Not on file  Occupational History  . Not on file  Tobacco Use  . Smoking status: Former Smoker    Quit date: 08/12/1988    Years since quitting: 32.3  . Smokeless tobacco: Never Used  Vaping Use  . Vaping Use: Never used  Substance and Sexual Activity  . Alcohol use: Yes    Alcohol/week: 0.0 standard drinks    Comment: occassionally  . Drug use: No  . Sexual activity: Not Currently    Birth control/protection: Post-menopausal  Other Topics Concern  . 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 Difficulty of Paying Living Expenses: Not hard at all  Food Insecurity: No Food Insecurity  . Worried About Charity fundraiser in the Last Year: Never true  . Ran Out of Food in the Last Year: Never true  Transportation Needs: No Transportation Needs  . Lack of Transportation (Medical): No  . Lack of Transportation (Non-Medical): No  Physical Activity: Sufficiently Active  . Days of Exercise per Week: 5 days  . Minutes of Exercise per Session: 30 min  Stress: No Stress Concern Present  . Feeling of Stress : Not at all  Social Connections: Moderately Integrated  . Frequency of Communication with Friends and Family: More than three times a week  . Frequency of Social Gatherings with Friends and Family: More than three times a week  . Attends Religious Services: More than 4 times per year  . Active Member of Clubs or Organizations: No  . Attends Archivist Meetings: More than 4 times per year  . Marital Status: Widowed    Tobacco Counseling Counseling given: Not Answered   Clinical Intake:  Pre-visit preparation completed: Yes  Pain : No/denies pain Pain Score: 0-No  pain     BMI - recorded: 22.9 Nutritional Status: BMI of 19-24  Normal Nutritional Risks: None Diabetes: No  How often do you need to have someone help you when you read instructions, pamphlets, or other written materials from your doctor or pharmacy?: 1 - Never What is the last grade level you completed in school?: High School Graduate  Diabetic? no  Interpreter Needed?: No  Information entered by :: Lisette Abu, LPN   Activities of Daily Living In your present state of health, do you have any difficulty performing the following activities: 12/01/2020  Hearing? Y  Comment wears hearing aids  Vision? N  Difficulty concentrating or making  decisions? N  Walking or climbing stairs? N  Dressing or bathing? N  Doing errands, shopping? N  Preparing Food and eating ? N  Using the Toilet? N  In the past six months, have you accidently leaked urine? Y  Comment wears protection  Do you have problems with loss of bowel control? N  Managing your Medications? N  Managing your Finances? N  Housekeeping or managing your Housekeeping? N  Some recent data might be hidden    Patient Care Team: Hoyt Koch, MD as PCP - General (Internal Medicine) Melida Quitter, MD as Consulting Physician Princess Bruins, MD (Obstetrics and Gynecology) Suella Broad, MD (Physical Medicine and Rehabilitation) Bond, Tracie Harrier, MD (Ophthalmology) Katy Apo, MD (Ophthalmology) Kristeen Miss, MD as Consulting Physician (Neurosurgery) Pyrtle, Lajuan Lines, MD as Consulting Physician (Gastroenterology) Lemmons (Dentistry) Charlton Haws, [facility] as Pharmacist (Pharmacist)  Indicate any recent Medical Services you may have received from other than [facility] providers in the past year (date may be approximate).     Assessment:   This is a routine wellness examination for [place].  Hearing/Vision screen No exam data present  Dietary issues and exercise activities discussed: Current  Exercise Habits: Home exercise routine, Type of exercise: walking;Other - see comments (water aerobics 3 times a week), Time (Minutes): 30, Frequency (Times/Week): 5, Weekly Exercise (Minutes/Week): 150, Intensity: Moderate, Exercise limited by: psychological condition(s)  Goals    . Client understands the importance of follow-up with providers by attending scheduled visits    . Manage My Medicine     Timeframe:  Long-Range Goal Priority:  Medium Start Date:        10/12/20                     Expected End Date:    10/12/21                   Follow Up Date 04/14/21   - call for medicine refill 2 or 3 days before it runs out - call if I am sick and can't take my medicine - keep a list of all the medicines I take; vitamins and herbals too - use a pillbox to sort medicine  -Start taking Calcium-Vitamin D to keep bones healthy -Start taking buspirone twice a day since it wears off after a few hours   Why is this important?   . These steps will help you keep on track with your medicines.   Notes:     . Patient Stated     Continue to  Exercise,  eat healthier, go out with my friends be active socially through church and other activities.     . Patient Stated     Continue to exercise by doing water aerobics and stay socially active within my church and with my friends.    . Patient Stated     Continue to exercise by doing water aerobics, volunteer more and get back on her feet.    . Patient Stated     My goal is to stay healthy and eat healthier.    . Weight (lb) < 140 lb (63.5 kg)     Would like to lose 11 pounds by staying active and making healthy food choices.       Depression Screen PHQ 2/9 Scores 12/01/2020 07/27/2020 12/01/2019 11/26/2018 08/25/2018 11/20/2017 08/22/2016  PHQ - 2 Score 0 0 0 1 1 3  0  PHQ- 9 Score - 0 - 1 3 5  -  Fall Risk Fall Risk  12/01/2020 12/01/2019 11/26/2018 08/25/2018 11/20/2017  Falls in the past year? 1 1 0 1 No  Comment - - - - -  Number falls in past yr:  0 0 0 0 -  Comment - - - - -  Injury with Fall? 1 1 - 0 -  Risk for fall due to : - Orthopedic patient;Impaired balance/gait Impaired mobility - -  Follow up - Falls evaluation completed;Education provided;Falls prevention discussed Falls prevention discussed - -    FALL RISK PREVENTION PERTAINING TO THE HOME:  Any stairs in or around the home? Yes  If so, are there any without handrails? No  Home free of loose throw rugs in walkways, pet beds, electrical cords, etc? Yes  Adequate lighting in your home to reduce risk of falls? Yes   ASSISTIVE DEVICES UTILIZED TO PREVENT FALLS:  Life alert? No  Use of a cane, walker or w/c? No  Grab bars in the bathroom? No  Shower chair or bench in shower? Yes  Elevated toilet seat or a handicapped toilet? No   TIMED UP AND GO:  Was the test performed? No .  Length of time to ambulate 10 feet: 0 sec.   Gait steady and fast without use of assistive device  Cognitive Function: Normal cognitive status assessed by direct observation by this Nurse Health Advisor. No abnormalities found.       6CIT Screen 12/01/2019  What Year? 0 points  What month? 0 points  What time? 0 points  Count back from 20 0 points  Months in reverse 0 points  Repeat phrase 0 points  Total Score 0    Immunizations Immunization History  Administered Date(s) Administered  . Influenza Split 05/12/2012  . Influenza Whole 05/12/2009, 05/29/2010  . Influenza, High Dose Seasonal PF 06/12/2013, 06/05/2016, 05/27/2018, 07/17/2020  . Influenza-Unspecified 05/12/2014, 05/27/2015, 05/12/2017, 05/28/2018  . PFIZER Comirnaty(Gray Top)Covid-19 Tri-Sucrose Vaccine 10/05/2019, 10/26/2019, 05/22/2020  . Pneumococcal Conjugate-13 08/10/2013  . Pneumococcal Polysaccharide-23 07/12/2011  . Td 01/13/2004  . Tdap 02/23/2014  . Zoster 01/27/2008, 05/12/2009  . Zoster Recombinat (Shingrix) 03/01/2019, 06/01/2019    TDAP status: Up to date  Flu Vaccine status: Up to  date  Pneumococcal vaccine status: Up to date  Covid-19 vaccine status: Completed vaccines  Qualifies for Shingles Vaccine? Yes   Zostavax completed Yes   Shingrix Completed?: Yes  Screening Tests Health Maintenance  Topic Date Due  . COLONOSCOPY (Pts 45-60yrs Insurance coverage will need to be confirmed)  06/27/2020  . COVID-19 Vaccine (4 - Booster for Pfizer series) 11/20/2020  . INFLUENZA VACCINE  03/12/2021  . TETANUS/TDAP  02/24/2024  . DEXA SCAN  Completed  . PNA vac Low Risk Adult  Completed  . HPV VACCINES  Aged Out    Health Maintenance  Health Maintenance Due  Topic Date Due  . COLONOSCOPY (Pts 45-20yrs Insurance coverage will need to be confirmed)  06/27/2020  . COVID-19 Vaccine (4 - Booster for Pfizer series) 11/20/2020    Colorectal cancer screening: No longer required.   Mammogram status: Completed 03/20/2020. Repeat every year  Bone Density status: Completed 03/10/2018. Results reflect: Bone density results: OSTEOPENIA. Repeat every 2 years.  Lung Cancer Screening: (Low Dose CT Chest recommended if Age 42-80 years, 30 pack-year currently smoking OR have quit w/in 15years.) does not qualify.   Lung Cancer Screening Referral: no  Additional Screening:  Hepatitis C Screening: does not qualify; Completed no  Vision Screening: Recommended annual ophthalmology exams for early  detection of glaucoma and other disorders of the eye. Is the patient up to date with their annual eye exam?  Yes  Who is the provider or what is the name of the office in which the patient attends annual eye exams? Katy Apo, MD and Raynelle Fanning, MD. If pt is not established with a provider, would they like to be referred to a provider to establish care? No .   Dental Screening: Recommended annual dental exams for proper oral hygiene  Community Resource Referral / Chronic Care Management: CRR required this visit?  No   CCM required this visit?  No      Plan:     I have  personally reviewed and noted the following in the patient's chart:   . Medical and social history . Use of alcohol, tobacco or illicit drugs  . Current medications and supplements . Functional ability and status . Nutritional status . Physical activity . Advanced directives . List of other physicians . Hospitalizations, surgeries, and ER visits in previous 12 months . Vitals . Screenings to include cognitive, depression, and falls . Referrals and appointments  In addition, I have reviewed and discussed with patient certain preventive protocols, quality metrics, and best practice recommendations. A written personalized care plan for preventive services as well as general preventive health recommendations were provided to patient.     Sheral Flow, LPN   3/72/9021   Nurse Notes:  Patient stated that she has no issues with gait or balance; does not use any assistive devices. Medications reviewed with patient; no opioid use noted.

## 2020-12-08 ENCOUNTER — Encounter: Payer: Self-pay | Admitting: *Deleted

## 2020-12-11 DIAGNOSIS — H401131 Primary open-angle glaucoma, bilateral, mild stage: Secondary | ICD-10-CM | POA: Diagnosis not present

## 2020-12-11 DIAGNOSIS — Z961 Presence of intraocular lens: Secondary | ICD-10-CM | POA: Diagnosis not present

## 2020-12-11 DIAGNOSIS — H52203 Unspecified astigmatism, bilateral: Secondary | ICD-10-CM | POA: Diagnosis not present

## 2020-12-12 ENCOUNTER — Encounter: Payer: Self-pay | Admitting: Internal Medicine

## 2020-12-12 ENCOUNTER — Ambulatory Visit (INDEPENDENT_AMBULATORY_CARE_PROVIDER_SITE_OTHER): Payer: Medicare HMO | Admitting: Internal Medicine

## 2020-12-12 VITALS — BP 122/60 | HR 67 | Ht 62.0 in | Wt 135.1 lb

## 2020-12-12 DIAGNOSIS — Z860100 Personal history of colon polyps, unspecified: Secondary | ICD-10-CM

## 2020-12-12 DIAGNOSIS — R350 Frequency of micturition: Secondary | ICD-10-CM

## 2020-12-12 DIAGNOSIS — K219 Gastro-esophageal reflux disease without esophagitis: Secondary | ICD-10-CM | POA: Diagnosis not present

## 2020-12-12 NOTE — Progress Notes (Signed)
Subjective:    Patient ID: Linda [name], female    DOB: 1940/08/14, 80 y.o.   MRN: [id number]  HPI Linda [name] is an 80 year old female known to me with a history of gastritis, GERD, subcentimeter sessile serrated colon polyps, who is seen in follow-up to discuss surveillance colonoscopy.  She is here alone today.  Her last colonoscopy was performed in November 2016.  2 polyps were removed ranging from 3 to 5 mm in size which were found to be sessile serrated polyps.  There is no dysplasia.  The rest of the exam was otherwise normal.  From a GI perspective she is doing very good.  In the past she has had some troublesome hemorrhoid symptoms but they have not been an issue of late.  Her bowel movements have been regular.  No recent loose stools, diarrhea.  No constipation.  No blood in stool or melena.  Her appetite has been usual for her without nausea or vomiting.  No dysphagia or odynophagia.  She does continue to with omeprazole 40 mg daily.  She is having some issues with bladder frequency and incontinence and she wishes to discuss this further with Dr. Sharlet Salina and possibly see a urologist.  She moved into her own townhome after downsizing from the home she previously owned with her husband, Linda [name].  Linda [name] was also a patient of mine and he died just over 3 years ago.  Her son lives in the [place] and her daughter lives in [place].  Review of Systems As per HPI, otherwise negative  Current Medications, Allergies, Past Medical History, Past Surgical History, Family History and Social History were reviewed in [employer] record.     Objective:   Physical Exam BP 122/60   Pulse 67   Ht 5\' 2"  (1.575 m)   Wt 135 lb 2 oz (61.3 kg)   BMI 24.71 kg/m  Gen: awake, alert, NAD HEENT: anicteric Neuro: nonfocal     Assessment & Plan:  80 year old female known to me with a history of gastritis, GERD, subcentimeter sessile  serrated colon polyps, who is seen in follow-up to discuss surveillance colonoscopy.   1.  History of sessile serrated colon polyps --by guidelines surveillance colonoscopy would be due at this time.  She had a colonoscopy about 5 and half years ago.  She is now 80 years old but doing very well with little significant other chronic disease.  We discussed that colonoscopy typically stops for screening and surveillance around age 72 but that this decision is individualized based on once overall health.  We discussed the risks and benefits of colonoscopy including the slightly higher risk of cardiopulmonary complications with sedation as well as perforation.  That said, I do think that she could do very well with repeat colonoscopy if this is what she elects.  She will think more about this and let me know if she wishes to proceed.  If she wishes to discontinue screening and surveillance I also am completely comfortable with this decision. -- She will contact us if she wishes to proceed with surveillance colonoscopy in the coming months  2.  History of GERD and gastritis --she has remained on omeprazole daily and reasonable to continue this for now  3.  Urinary frequency and incontinence --she mentioned this symptom to me and I encouraged her to discuss this with Dr. Sharlet Salina.  I will forward this note to Dr. Sharlet Salina regarding this issue.  25 minutes total  spent today including patient facing time, coordination of care, reviewing medical history/procedures/pertinent radiology studies, and documentation of the encounter.

## 2020-12-12 NOTE — Patient Instructions (Signed)
Think about colonoscopy and call us to let us know if you decide to move forward with this. Call our office at [phone] or mychart message [place].  If you are age 80 or older, your body mass index should be between 23-30. Your Body mass index is 24.71 kg/m. If this is out of the aforementioned range listed, please consider follow up with your Primary Care Provider.  Due to recent changes in healthcare laws, you may see the results of your imaging and laboratory studies on MyChart before your provider has had a chance to review them.  We understand that in some cases there may be results that are confusing or concerning to you. Not all laboratory results come back in the same time frame and the provider may be waiting for multiple results in order to interpret others.  Please give [place] 48 hours in order for your provider to thoroughly review all the results before contacting the office for clarification of your results.

## 2020-12-21 ENCOUNTER — Telehealth: Payer: Self-pay | Admitting: Internal Medicine

## 2020-12-21 DIAGNOSIS — L920 Granuloma annulare: Secondary | ICD-10-CM | POA: Diagnosis not present

## 2020-12-21 DIAGNOSIS — D485 Neoplasm of uncertain behavior of skin: Secondary | ICD-10-CM | POA: Diagnosis not present

## 2020-12-21 DIAGNOSIS — D1801 Hemangioma of skin and subcutaneous tissue: Secondary | ICD-10-CM | POA: Diagnosis not present

## 2020-12-21 DIAGNOSIS — L821 Other seborrheic keratosis: Secondary | ICD-10-CM | POA: Diagnosis not present

## 2020-12-21 DIAGNOSIS — D692 Other nonthrombocytopenic purpura: Secondary | ICD-10-CM | POA: Diagnosis not present

## 2020-12-21 NOTE — Telephone Encounter (Signed)
See message below,thanks

## 2020-12-21 NOTE — Telephone Encounter (Signed)
Patient calling, states she went to her dermatologist about her skin condition and they were wondering if she had a recent diabetes screening or thyroid screening. And if not if Dr. Sharlet Salina thought it would be time to do that.

## 2020-12-22 NOTE — Telephone Encounter (Signed)
She has not had HgA1c and could make office visit to discuss if this is needed. I would recommend she fill out records release prior to visit or bring note from dermatology visit so we can assess all relevant information about her health.

## 2020-12-25 NOTE — Telephone Encounter (Signed)
Spoke to patient scheduled for 01/01/2021 at 3:00 pm to check A1c and Thyroid level. She will bring information from Dermatology.

## 2020-12-28 ENCOUNTER — Other Ambulatory Visit: Payer: Self-pay | Admitting: Nurse Practitioner

## 2020-12-28 DIAGNOSIS — G894 Chronic pain syndrome: Secondary | ICD-10-CM | POA: Diagnosis not present

## 2021-01-01 ENCOUNTER — Ambulatory Visit (INDEPENDENT_AMBULATORY_CARE_PROVIDER_SITE_OTHER): Payer: Medicare HMO | Admitting: Internal Medicine

## 2021-01-01 ENCOUNTER — Other Ambulatory Visit: Payer: Self-pay

## 2021-01-01 ENCOUNTER — Encounter: Payer: Self-pay | Admitting: Internal Medicine

## 2021-01-01 VITALS — BP 124/80 | HR 70 | Temp 98.2°F | Resp 18 | Ht 62.0 in | Wt 136.8 lb

## 2021-01-01 DIAGNOSIS — R21 Rash and other nonspecific skin eruption: Secondary | ICD-10-CM | POA: Diagnosis not present

## 2021-01-01 LAB — HEMOGLOBIN A1C: Hgb A1c MFr Bld: 6 % (ref 4.6–6.5)

## 2021-01-01 LAB — TSH: TSH: 1.87 u[IU]/mL (ref 0.35–4.50)

## 2021-01-01 NOTE — Progress Notes (Signed)
   Subjective:   Patient ID: Linda [name], female    DOB: 08-Oct-1940, 80 y.o.   MRN: [id number]  HPI The patient is an 80 YO female coming in for concerns about skin lesion. Her dermatologist told her the name of this thing and asked her to get screened for thyroid and diabetes. She denies heat/cold intolerance or increased thirst/urination. She did not bring records from dermatology and we do not have this available for during the visit.  Review of Systems  Constitutional: Negative.   HENT: Negative.   Eyes: Negative.   Respiratory: Negative for cough, chest tightness and shortness of breath.   Cardiovascular: Negative for chest pain, palpitations and leg swelling.  Gastrointestinal: Negative for abdominal distention, abdominal pain, constipation, diarrhea, nausea and vomiting.  Musculoskeletal: Negative.   Skin: Positive for rash.  Neurological: Negative.   Psychiatric/Behavioral: Negative.     Objective:  Physical Exam Constitutional:      Appearance: She is well-developed.  HENT:     Head: Normocephalic and atraumatic.  Cardiovascular:     Rate and Rhythm: Normal rate and regular rhythm.  Pulmonary:     Effort: Pulmonary effort is normal. No respiratory distress.     Breath sounds: Normal breath sounds. No wheezing or rales.  Abdominal:     General: Bowel sounds are normal. There is no distension.     Palpations: Abdomen is soft.     Tenderness: There is no abdominal tenderness. There is no rebound.  Musculoskeletal:     Cervical back: Normal range of motion.  Skin:    General: Skin is warm and dry.  Neurological:     Mental Status: She is alert and oriented to person, place, and time.     Coordination: Coordination normal.     Vitals:   01/01/21 1504  BP: 124/80  Pulse: 70  Resp: 18  Temp: 98.2 F (36.8 C)  TempSrc: Oral  SpO2: 97%  Weight: 136 lb 12.8 oz (62.1 kg)  Height: 5\' 2"  (1.575 m)    This visit occurred during the SARS-CoV-2 public health  emergency.  Safety protocols were in place, including screening questions prior to the visit, additional usage of staff PPE, and extensive cleaning of exam room while observing appropriate contact time as indicated for disinfecting solutions.   Assessment & Plan:

## 2021-01-01 NOTE — Patient Instructions (Signed)
We will check the labs today. 

## 2021-01-03 DIAGNOSIS — R21 Rash and other nonspecific skin eruption: Secondary | ICD-10-CM | POA: Insufficient documentation

## 2021-01-03 NOTE — Assessment & Plan Note (Signed)
Checking TSH and HgA1c as per dermatology. Patient cannot recall name of rash or lesion. We do not have those records available but have requested patient to get those to [place].

## 2021-01-30 ENCOUNTER — Other Ambulatory Visit: Payer: Self-pay | Admitting: Internal Medicine

## 2021-01-31 DIAGNOSIS — H401134 Primary open-angle glaucoma, bilateral, indeterminate stage: Secondary | ICD-10-CM | POA: Diagnosis not present

## 2021-03-19 ENCOUNTER — Other Ambulatory Visit: Payer: Self-pay | Admitting: Internal Medicine

## 2021-04-02 ENCOUNTER — Other Ambulatory Visit: Payer: Self-pay | Admitting: Internal Medicine

## 2021-04-05 DIAGNOSIS — Z1231 Encounter for screening mammogram for malignant neoplasm of breast: Secondary | ICD-10-CM | POA: Diagnosis not present

## 2021-04-13 ENCOUNTER — Telehealth: Payer: PPO

## 2021-04-13 ENCOUNTER — Telehealth: Payer: Medicare HMO

## 2021-04-19 ENCOUNTER — Other Ambulatory Visit: Payer: Self-pay

## 2021-04-19 ENCOUNTER — Ambulatory Visit (INDEPENDENT_AMBULATORY_CARE_PROVIDER_SITE_OTHER): Payer: Medicare HMO | Admitting: Pharmacist

## 2021-04-19 DIAGNOSIS — K219 Gastro-esophageal reflux disease without esophagitis: Secondary | ICD-10-CM

## 2021-04-19 DIAGNOSIS — F320 Major depressive disorder, single episode, mild: Secondary | ICD-10-CM

## 2021-04-19 DIAGNOSIS — N3281 Overactive bladder: Secondary | ICD-10-CM

## 2021-04-19 DIAGNOSIS — M8580 Other specified disorders of bone density and structure, unspecified site: Secondary | ICD-10-CM

## 2021-04-19 DIAGNOSIS — M162 Bilateral osteoarthritis resulting from hip dysplasia: Secondary | ICD-10-CM

## 2021-04-19 NOTE — Progress Notes (Signed)
Chronic Care Management Pharmacy Note  04/19/2021 Name:  Linda [name] MRN:  [id number] DOB:  1940-11-15  Summary: -Pt reports frequent urination for several years, she is interested in medication to help with this and would like to discuss with PCP  Recommendations/Changes made from today's visit: -Scheduled PCP appt for bladder issues. Consider OAB medication Tier 2: Solifenacin, oxybutynin IR, trospium Tier 3: oxybutynin ER Tier 4: Myrbetriq, tolterodine, darifenacin -Advised Covid booster, flu vaccine   Subjective: Linda [name] is an 80 y.o. year old female who is a primary patient of Hoyt Koch, MD.  The CCM team was consulted for assistance with disease management and care coordination needs.    Engaged with patient by telephone for follow up visit in response to provider referral for pharmacy case management and/or care coordination services.   Consent to Services:  The patient was given information about Chronic Care Management services, agreed to services, and gave verbal consent prior to initiation of services.  Please see initial visit note for detailed documentation.   Patient Care Team: Hoyt Koch, MD as PCP - General (Internal Medicine) Melida Quitter, MD as Consulting Physician Princess Bruins, MD (Obstetrics and Gynecology) Suella Broad, MD (Physical Medicine and Rehabilitation) Bond, Tracie Harrier, MD (Ophthalmology) Katy Apo, MD (Ophthalmology) Kristeen Miss, MD as Consulting Physician (Neurosurgery) Pyrtle, Lajuan Lines, MD as Consulting Physician (Gastroenterology) Ignatius Specking (Dentistry) Charlton Haws, [facility] as Pharmacist (Pharmacist)  Patient moved into new condo in November 2021. Husband of 20 years passed away, pt has been more anxious ever since.  Recent office visits: 01/01/21 Dr Sharlet [place]ina OV: skin rash, labwork concerns; A1c 6.0, prediabetes, monitor yearly.  07/27/20 Dr Sharlet Salina OV: chronic f/u, c/o fatigue. Labs  stable, no changes made. VitD 20.5  Recent consult visits: 01/31/21 Dr Edilia Bo (eye center): f/u glaucoma; no changes.  12/12/20 Dr Hilarie Fredrickson (GI): hx colonic polyps; discussed colonoscopy screenings typically stop around age 24, choice is individualized, pt will think about it.  10/26/20 Dr Milinda Pointer (podiatry): toe injection did not help. Likely neuropraxia from cast or tourniquette, nothing to do for her.   09/28/20 Dr Milinda Pointer (podiatry): eval for tendonitis, neuritis, hx ankle fx last year. Given steroid injection in toe.  07/24/20 Dr Edilia Bo (eye center): f/u [facility] visits: None in previous 6 months  Objective:  Lab Results  Component Value Date   CREATININE 0.76 07/27/2020   BUN 15 07/27/2020   GFR 74.26 07/27/2020   GFRNONAA 85.23 02/27/2010   NA 135 07/27/2020   K 3.7 07/27/2020   CALCIUM 9.4 07/27/2020   CO2 27 07/27/2020    Lab Results  Component Value Date/Time   HGBA1C 6.0 01/01/2021 03:31 PM   GFR 74.26 07/27/2020 02:49 PM   GFR 98.31 09/07/2019 10:57 AM    Last diabetic Eye exam: No results found for: HMDIABEYEEXA  Last diabetic Foot exam: No results found for: HMDIABFOOTEX   Lab Results  Component Value Date   CHOL 185 07/27/2020   HDL 61.20 07/27/2020   LDLCALC 101 (H) 07/27/2020   LDLDIRECT 111.0 11/24/2017   TRIG 115.0 07/27/2020   CHOLHDL 3 07/27/2020    Hepatic Function Latest Ref Rng & Units 07/27/2020 09/07/2019 08/25/2018  Total Protein 6.0 - 8.3 g/dL 7.3 7.0 7.1  Albumin 3.5 - 5.2 g/dL 4.5 4.3 4.6  AST 0 - 37 U/L 18 14 16   ALT 0 - 35 U/L 13 12 13   Alk Phosphatase 39 - 117 U/L 85 100 59  Total Bilirubin 0.2 -  1.2 mg/dL 0.4 0.5 0.4  Bilirubin, Direct 0.0 - 0.3 mg/dL - - -    Lab Results  Component Value Date/Time   TSH 1.87 01/01/2021 03:31 PM   TSH 1.39 07/27/2020 02:49 PM    CBC Latest Ref Rng & Units 07/27/2020 09/07/2019 08/25/2018  WBC 4.0 - 10.5 K/uL 7.7 10.3 7.4  Hemoglobin 12.0 - 15.0 g/dL 13.0 12.4 12.8  Hematocrit 36.0 - 46.0 %  39.2 36.9 38.6  Platelets 150.0 - 400.0 K/uL 220.0 330.0 213.0    Lab Results  Component Value Date/Time   VD25OH 20.54 (L) 07/27/2020 02:49 PM    Clinical ASCVD: No  The ASCVD Risk score (Arnett DK, et al., 2019) failed to calculate for the following reasons:   The 2019 ASCVD risk score is only valid for ages 80 to 51    Depression screen PHQ 2/9 01/01/2021 12/01/2020 07/27/2020  Decreased Interest 0 0 0  Down, Depressed, Hopeless 0 0 0  PHQ - 2 Score 0 0 0  Altered sleeping 0 - 0  Tired, decreased energy 0 - 0  Change in appetite 0 - 0  Feeling bad or failure about yourself  0 - 0  Trouble concentrating 0 - 0  Moving slowly or fidgety/restless 0 - 0  Suicidal thoughts 0 - 0  PHQ-9 Score 0 - 0  Difficult doing work/chores - - Not difficult at all    GAD 7 : Generalized Anxiety Score 08/25/2018  Nervous, Anxious, on Edge 2  Control/stop worrying 1  Worry too much - different things 0  Trouble relaxing 0  Restless 0  Easily annoyed or irritable 1  Afraid - awful might happen 1  Total GAD 7 Score 5    Social History   Tobacco Use  Smoking Status Former   Types: Cigarettes   Quit date: 08/12/1988   Years since quitting: 32.7  Smokeless Tobacco Never   BP Readings from Last 3 Encounters:  01/01/21 124/80  12/12/20 122/60  12/01/20 120/60   Pulse Readings from Last 3 Encounters:  01/01/21 70  12/12/20 67  12/01/20 72   Wt Readings from Last 3 Encounters:  01/01/21 136 lb 12.8 oz (62.1 kg)  12/12/20 135 lb 2 oz (61.3 kg)  12/01/20 133 lb 6.4 oz (60.5 kg)   BMI Readings from Last 3 Encounters:  01/01/21 25.02 kg/m  12/12/20 24.71 kg/m  12/01/20 22.90 kg/m    Assessment/Interventions: Review of patient past medical history, allergies, medications, health status, including review of consultants reports, laboratory and other test data, was performed as part of comprehensive evaluation and provision of chronic care management services.   SDOH:  (Social  Determinants of Health) assessments and interventions performed: Yes  SDOH Screenings   Alcohol Screen: Low Risk    Last Alcohol Screening Score (AUDIT): 2  Depression (PHQ2-9): Low Risk    PHQ-2 Score: 0  Financial Resource Strain: Low Risk    Difficulty of Paying Living Expenses: Not hard at all  Food Insecurity: No Food Insecurity   Worried About Charity fundraiser in the Last Year: Never true   Ran Out of Food in the Last Year: Never true  Housing: Low Risk    Last Housing Risk Score: 0  Physical Activity: Sufficiently Active   Days of Exercise per Week: 5 days   Minutes of Exercise per Session: 30 min  Social Connections: Moderately Integrated   Frequency of Communication with Friends and Family: More than three times a week  Frequency of Social Gatherings with Friends and Family: More than three times a week   Attends Religious Services: More than 4 times per year   Active Member of Clubs or Organizations: No   Attends Music therapist: More than 4 times per year   Marital Status: Widowed  Stress: No Stress Concern Present   Feeling of Stress : Not at all  Tobacco Use: Medium Risk   Smoking Tobacco Use: Former   Smokeless Tobacco Use: Never  Transportation Needs: No Data processing manager (Medical): No   Lack of Transportation (Non-Medical): No     CCM Care Plan  No Known Allergies  Medications Reviewed Today     Reviewed by Charlton Haws, [facility] (Pharmacist) on 04/19/21 at 1213  Med List Status: <None>   Medication Order Taking? Sig Documenting Provider Last Dose Status Informant  acetaminophen (TYLENOL) 650 MG CR tablet [id number] Yes Take 1,300 mg by mouth 2 (two) times daily. [provider] Taking Active   b complex vitamins tablet [id number] Yes Take 1 tablet by mouth daily. [provider] Taking Active   Biotin 10000 MCG TABS [id number] Yes Take by mouth. [provider] Taking Active    busPIRone (BUSPAR) 5 MG tablet [id number] Yes TAKE (1) TABLET TWICE A DAY AS NEEDED. Hoyt Koch, MD Taking Active   Calcium Carb-Cholecalciferol (CALCIUM-VITAMIN D3) 600-400 MG-UNIT TABS [id number] Yes Take by mouth. [provider] Taking Active   ciclopirox (PENLAC) 8 % solution [id number] Yes  [provider] Taking Active   clobetasol cream (TEMOVATE) 0.05 % [id number] Yes Apply topically. [provider] Taking Active   dorzolamide-timolol (COSOPT) 22.3-6.8 MG/ML ophthalmic solution [id number] Yes Place 1 drop into both eyes 2 (two) times daily. [provider] Taking Active   gabapentin (NEURONTIN) 300 MG capsule [id number] Yes Take 600 mg by mouth at bedtime. [provider] Taking Active   hydroxychloroquine (PLAQUENIL) 200 MG tablet [id number] Yes Take 300 mg by mouth daily. [provider] Taking Active   omeprazole (PRILOSEC) 40 MG capsule [id number] Yes TAKE ONE CAPSULE BY MOUTH DAILY Pyrtle, Lajuan Lines, MD Taking Active   sertraline (ZOLOFT) 100 MG tablet [id number] Yes TAKE 1 TABLET EACH DAY. Hoyt Koch, MD Taking Active   triamcinolone ointment (KENALOG) 0.1 % [id number] Yes SMARTSIG:1 Application Topical 2-3 Times Daily [provider] Taking Active             Patient Active Problem List   Diagnosis Date Noted   Skin rash 01/03/2021   Pain in joint of right elbow 09/11/2020   Other fatigue 07/28/2020   Left fibular fracture 08/24/2019   Encounter for orthopedic follow-up care 02/08/2019   Bilateral hand pain 10/21/2018   Dupuytren's disease of palm 01/19/2018   Pain in right hand 01/19/2018   Pain in joint of left shoulder 09/17/2017   Bilateral sensorineural hearing loss 03/12/2016   Routine general medical examination at a health care facility 07/22/2015   Hip dysplasia, acquired    Acetabular labrum tear, right, initial encounter 01/25/2015   Osteoarthritis of both hips resulting from hip  dysplasia 01/25/2015   Primary osteoarthritis of right hip 01/25/2015   Anemia 11/06/2012   Anxiety 11/06/2012   Depression 11/06/2012   Cataract, nuclear 10/28/2012   Primary open angle glaucoma of both eyes, indeterminate stage 10/28/2012   Ptosis of eyelid 10/28/2012   Bladder prolapse, female, acquired 01/09/2012   MDD (major depressive disorder), single episode, mild ([place]) 05/29/2010  Unspecified glaucoma 12/14/2009   GERD (gastroesophageal reflux disease) 05/21/2007   Osteoarthritis 05/21/2007    Immunization History  Administered Date(s) Administered   Influenza Split 05/12/2012   Influenza Whole 05/12/2009, 05/29/2010   Influenza, High Dose Seasonal PF 06/12/2013, 06/05/2016, 05/27/2018, 07/17/2020   Influenza-Unspecified 05/12/2014, 05/27/2015, 05/12/2017, 05/28/2018   PFIZER Comirnaty(Gray Top)Covid-19 Tri-Sucrose Vaccine 10/05/2019, 10/26/2019, 05/22/2020   Pneumococcal Conjugate-13 08/10/2013   Pneumococcal Polysaccharide-23 07/12/2011   Td 01/13/2004   Tdap 02/23/2014   Zoster Recombinat (Shingrix) 03/01/2019, 06/01/2019   Zoster, Live 01/27/2008, 05/12/2009    Conditions to be addressed/monitored:  GERD, Depression, Anxiety, Osteopenia, Osteoarthritis, Overactive Bladder and Dermatitis    Care Plan : [place]  Updates made by Charlton Haws, [place] since 04/19/2021 12:00 AM     Problem: GERD, Depression, Anxiety, Osteopenia, Osteoarthritis, Overactive Bladder   Priority: High     Long-Range Goal: Disease management   Start Date: 10/12/2020  Expected End Date: 10/12/2021  Recent Progress: On track  Priority: High  Note:   Current Barriers:  Unable to independently monitor therapeutic efficacy Suboptimal therapeutic regimen for overactive bladder  Pharmacist Clinical Goal(s):  Patient will achieve adherence to monitoring guidelines and medication adherence to achieve therapeutic efficacy adhere to plan to optimize therapeutic regimen for  OAB as evidenced by report of adherence to recommended medication management changes through collaboration with PharmD and provider.   Interventions: 1:1 collaboration with Hoyt Koch, MD regarding development and update of comprehensive plan of care as evidenced by provider attestation and co-signature Inter-disciplinary care team collaboration (see longitudinal plan of care) Comprehensive medication review performed; medication list updated in electronic medical record  Depression/Anxiety (Goal: manage symptoms)  -Controlled - pt reports mood is under control, she feels good overall -Current treatment: Sertraline 100 mg daily Buspirone 5 mg BID - pt usually does not take PM dose -Medications previously tried/failed: none -PHQ9: 0 (07/2020) -GAD7: 5 (08/2018) -Connected with PCP for mental health support -Educated on duration of action of buspirone (half life 2-3 hrs);  -Recommended to continue current medication  GERD (Goal: manage symptoms) -Controlled - per pt GI had instructed her to increase PPI to 40 mg after endoscopy results. Follows with Dr Hilarie Fredrickson; she recently added a probiotic for indigestion and feels better -Current treatment  Omeprazole 40 mg daily Probiotic once daily -Recommended to continue current medication  Osteoarthritis (Goal: manage pain) -Controlled - pt started back on gabapentin a few months ago and denies issues -pt does water aerobics, walks, and sleeps well at night -Current treatment  Tylenol 650 mg - 2 tab HS Gabapentin 300 mg - 2 cap HS -Counseled on benefits of gabapentin for nerve pain -Recommended to continue current medication  Osteopenia (Goal prevent fractures) -Not ideally controlled - pt reports she has DEXA scan scheduled next week -Last DEXA Scan: 2015 - osteopenia -Patient is not a candidate for pharmacologic treatment -Current treatment  No medications -Recommend [phone] units of vitamin D daily. Recommend 1200 mg of  calcium daily from dietary and supplemental sources.  Bladder issues (Goal: manage symptoms) -Not ideally controlled - pt reports increase in urinary frequency, denies dysuria.  -Current treatment  No medications -Counseled on available medications for overactive bladder. Pt would like to discuss issues with PCP and possible urology referral -Consulted insurance formulary: Tier 2: Solifenacin, oxybutynin IR, trospium Tier 3: oxybutynin ER Tier 4: Myrbetriq, tolterodine, darifenacin  Health Maintenance -Vaccine gaps: Covid booster, flu vaccine -Advised to get Covid booster and flu vaccine over  next 1-2 months  Patient Goals/Self-Care Activities - take medications as prescribed - focus on medication adherence by pill box - Get Covid booster and flu vaccine in Sept/October 2022 - Keep appt with PCP to discuss bladder issues       Medication Assistance: None required.  Patient affirms current coverage meets needs.  Compliance/Adherence/Medication fill history: Care Gaps: Colonoscopy- due 06/27/20 (pt has discussed with GI, choice is up to her to continue screenings past age 64, Dr Hilarie Fredrickson is ok with either decision she makes)  Vaccines - covid booster, influenza (advised pt to get covid booster and flu vaccine over the next 1-2 months)  Star-Rating Drugs: None  Patient's preferred pharmacy is:  [place], [place] [address] Phone: [phone] Fax: [phone]  Uses pill box? Yes Pt endorses 100% compliance  We discussed: Current pharmacy is preferred with insurance plan and patient is satisfied with pharmacy services Patient decided to: Continue current medication management strategy  Care Plan and Follow Up Patient Decision:  Patient agrees to Care Plan and Follow-up.  Plan: Telephone follow up appointment with care management team member scheduled for:  6 months  Charlene Brooke, PharmD, [place], CPP Clinical Pharmacist [facility] [phone]

## 2021-04-21 NOTE — Patient Instructions (Signed)
Visit Information  Phone number for Pharmacist: [phone]   Goals Addressed             This Visit's Progress    Manage My Medicine       Timeframe:  Long-Range Goal Priority:  Medium Start Date:        10/12/20                     Expected End Date:    10/12/21                   Follow Up Date March 2023   - call for medicine refill 2 or 3 days before it runs out - call if I am sick and can't take my medicine - keep a list of all the medicines I take; vitamins and herbals too - use a pillbox to sort medicine  -Start taking Calcium-Vitamin D to keep bones healthy - Get Covid booster and flu vaccine in Sept/October 2022 - Keep appt with PCP to discuss bladder issues   Why is this important?   These steps will help you keep on track with your medicines.   Notes:         Patient Care Plan: CCM Pharmacy Care Plan     Problem Identified: GERD, Depression, Anxiety, Osteopenia, Osteoarthritis, Overactive Bladder   Priority: High     Long-Range Goal: Disease management   Start Date: 10/12/2020  Expected End Date: 10/12/2021  Recent Progress: On track  Priority: High  Note:   Current Barriers:  Unable to independently monitor therapeutic efficacy Suboptimal therapeutic regimen for overactive bladder  Pharmacist Clinical Goal(s):  Patient will achieve adherence to monitoring guidelines and medication adherence to achieve therapeutic efficacy adhere to plan to optimize therapeutic regimen for OAB as evidenced by report of adherence to recommended medication management changes through collaboration with PharmD and provider.   Interventions: 1:1 collaboration with Hoyt Koch, MD regarding development and update of comprehensive plan of care as evidenced by provider attestation and co-signature Inter-disciplinary care team collaboration (see longitudinal plan of care) Comprehensive medication review performed; medication list updated in electronic medical  record  Depression/Anxiety (Goal: manage symptoms)  -Controlled - pt reports mood is under control, she feels good overall -Current treatment: Sertraline 100 mg daily Buspirone 5 mg BID - pt usually does not take PM dose -Medications previously tried/failed: none -PHQ9: 0 (07/2020) -GAD7: 5 (08/2018) -Connected with PCP for mental health support -Educated on duration of action of buspirone (half life 2-3 hrs);  -Recommended to continue current medication  GERD (Goal: manage symptoms) -Controlled - per pt GI had instructed her to increase PPI to 40 mg after endoscopy results. Follows with Dr Hilarie Fredrickson; she recently added a probiotic for indigestion and feels better -Current treatment  Omeprazole 40 mg daily Probiotic once daily -Recommended to continue current medication  Osteoarthritis (Goal: manage pain) -Controlled - pt started back on gabapentin a few months ago and denies issues -pt does water aerobics, walks, and sleeps well at night -Current treatment  Tylenol 650 mg - 2 tab HS Gabapentin 300 mg - 2 cap HS -Counseled on benefits of gabapentin for nerve pain -Recommended to continue current medication  Osteopenia (Goal prevent fractures) -Not ideally controlled - pt reports she has DEXA scan scheduled next week -Last DEXA Scan: 2015 - osteopenia -Patient is not a candidate for pharmacologic treatment -Current treatment  No medications -Recommend [phone] units of vitamin D daily. Recommend 1200 mg of  calcium daily from dietary and supplemental sources.  Bladder issues (Goal: manage symptoms) -Not ideally controlled - pt reports increase in urinary frequency, denies dysuria.  -Current treatment  No medications -Counseled on available medications for overactive bladder. Pt would like to discuss issues with PCP and possible urology referral -Consulted insurance formulary: Tier 2: Solifenacin, oxybutynin IR, trospium Tier 3: oxybutynin ER Tier 4: Myrbetriq, tolterodine,  darifenacin  Health Maintenance -Vaccine gaps: Covid booster, flu vaccine -Advised to get Covid booster and flu vaccine over next 1-2 months  Patient Goals/Self-Care Activities - take medications as prescribed - focus on medication adherence by pill box - Get Covid booster and flu vaccine in Sept/October 2022 - Keep appt with PCP to discuss bladder issues      Patient verbalizes understanding of instructions provided today and agrees to view in [place].  Telephone follow up appointment with pharmacy team member scheduled for: 6 months  Charlene Brooke, PharmD, [place], CPP Clinical Pharmacist [facility] [phone]

## 2021-04-23 DIAGNOSIS — M8589 Other specified disorders of bone density and structure, multiple sites: Secondary | ICD-10-CM | POA: Diagnosis not present

## 2021-04-23 DIAGNOSIS — Z780 Asymptomatic menopausal state: Secondary | ICD-10-CM | POA: Diagnosis not present

## 2021-04-27 ENCOUNTER — Ambulatory Visit (INDEPENDENT_AMBULATORY_CARE_PROVIDER_SITE_OTHER): Payer: Medicare HMO | Admitting: Internal Medicine

## 2021-04-27 ENCOUNTER — Encounter: Payer: Self-pay | Admitting: Internal Medicine

## 2021-04-27 ENCOUNTER — Other Ambulatory Visit: Payer: Self-pay

## 2021-04-27 VITALS — BP 128/70 | HR 74 | Temp 98.0°F | Resp 18 | Ht 62.0 in | Wt 139.0 lb

## 2021-04-27 DIAGNOSIS — N8110 Cystocele, unspecified: Secondary | ICD-10-CM | POA: Diagnosis not present

## 2021-04-27 NOTE — Progress Notes (Signed)
   Subjective:   Patient ID: Linda [name], female    DOB: [birth year], 80 y.o.   MRN: [id number]  HPI The patient is an 80 YO female coming in for bladder prolapse and urinary issues. Is not sure she wants to try medication as she does not like to take a lot of medications.   Review of Systems  Constitutional: Negative.   HENT: Negative.    Eyes: Negative.   Respiratory:  Negative for cough, chest tightness and shortness of breath.   Cardiovascular:  Negative for chest pain, palpitations and leg swelling.  Gastrointestinal:  Negative for abdominal distention, abdominal pain, constipation, diarrhea, nausea and vomiting.  Genitourinary:  Positive for urgency.  Musculoskeletal: Negative.   Skin: Negative.   Neurological: Negative.   Psychiatric/Behavioral: Negative.     Objective:  Physical Exam Constitutional:      Appearance: She is well-developed.  HENT:     Head: Normocephalic and atraumatic.  Cardiovascular:     Rate and Rhythm: Normal rate and regular rhythm.  Pulmonary:     Effort: Pulmonary effort is normal. No respiratory distress.     Breath sounds: Normal breath sounds. No wheezing or rales.  Abdominal:     General: Bowel sounds are normal. There is no distension.     Palpations: Abdomen is soft.     Tenderness: There is no abdominal tenderness. There is no rebound.  Musculoskeletal:     Cervical back: Normal range of motion.  Skin:    General: Skin is warm and dry.  Neurological:     Mental Status: She is alert and oriented to person, place, and time.     Coordination: Coordination normal.    Vitals:   04/27/21 1505  BP: 128/70  Pulse: 74  Resp: 18  Temp: 98 F (36.7 C)  TempSrc: Oral  SpO2: 93%  Weight: 139 lb (63 kg)  Height: '5\' 2"'$  (1.575 m)    This visit occurred during the SARS-CoV-2 public health emergency.  Safety protocols were in place, including screening questions prior to the visit, additional usage of staff PPE, and extensive cleaning of  exam room while observing appropriate contact time as indicated for disinfecting solutions.   Assessment & Plan:

## 2021-04-27 NOTE — Patient Instructions (Signed)
We will get you in with the urogynecologist to have them talk to you about the options.

## 2021-04-27 NOTE — Assessment & Plan Note (Signed)
Referral to urogynecology for assessment and she wishes to discuss definitive options.

## 2021-05-09 ENCOUNTER — Other Ambulatory Visit: Payer: Self-pay | Admitting: Internal Medicine

## 2021-05-11 DIAGNOSIS — F320 Major depressive disorder, single episode, mild: Secondary | ICD-10-CM | POA: Diagnosis not present

## 2021-05-11 DIAGNOSIS — M162 Bilateral osteoarthritis resulting from hip dysplasia: Secondary | ICD-10-CM | POA: Diagnosis not present

## 2021-05-11 DIAGNOSIS — R69 Illness, unspecified: Secondary | ICD-10-CM | POA: Diagnosis not present

## 2021-05-18 DIAGNOSIS — L820 Inflamed seborrheic keratosis: Secondary | ICD-10-CM | POA: Diagnosis not present

## 2021-05-18 DIAGNOSIS — L920 Granuloma annulare: Secondary | ICD-10-CM | POA: Diagnosis not present

## 2021-06-04 ENCOUNTER — Encounter: Payer: Self-pay | Admitting: Internal Medicine

## 2021-06-04 ENCOUNTER — Other Ambulatory Visit: Payer: Self-pay

## 2021-06-04 ENCOUNTER — Ambulatory Visit (INDEPENDENT_AMBULATORY_CARE_PROVIDER_SITE_OTHER): Payer: Medicare HMO | Admitting: Internal Medicine

## 2021-06-04 DIAGNOSIS — G4452 New daily persistent headache (NDPH): Secondary | ICD-10-CM

## 2021-06-04 NOTE — Patient Instructions (Signed)
It is possible that the sinuses could have caused the headache. It is okay to take tylenol for this and zyrtec if needed for sinuses.

## 2021-06-04 NOTE — Progress Notes (Signed)
   Subjective:   Patient ID: Linda [name], female    DOB: [birth year], 80 y.o.   MRN: [id number]  HPI The patient is an 80 YO female coming in for headache intermittent 3 weeks.   Review of Systems  Constitutional: Negative.   HENT: Negative.    Eyes: Negative.   Respiratory:  Negative for cough, chest tightness and shortness of breath.   Cardiovascular:  Negative for chest pain, palpitations and leg swelling.  Gastrointestinal:  Negative for abdominal distention, abdominal pain, constipation, diarrhea, nausea and vomiting.  Musculoskeletal: Negative.   Skin: Negative.   Neurological:  Positive for headaches.  Psychiatric/Behavioral: Negative.     Objective:  Physical Exam Constitutional:      Appearance: She is well-developed.  HENT:     Head: Normocephalic and atraumatic.  Cardiovascular:     Rate and Rhythm: Normal rate and regular rhythm.  Pulmonary:     Effort: Pulmonary effort is normal. No respiratory distress.     Breath sounds: Normal breath sounds. No wheezing or rales.  Abdominal:     General: Bowel sounds are normal. There is no distension.     Palpations: Abdomen is soft.     Tenderness: There is no abdominal tenderness. There is no rebound.  Musculoskeletal:     Cervical back: Normal range of motion.  Skin:    General: Skin is warm and dry.  Neurological:     Mental Status: She is alert and oriented to person, place, and time.     Coordination: Coordination normal.    Vitals:   06/04/21 1328  BP: 130/64  Pulse: 70  Resp: 18  SpO2: 97%  Weight: 137 lb (62.1 kg)  Height: 5\' 2"  (1.575 m)    This visit occurred during the SARS-CoV-2 public health emergency.  Safety protocols were in place, including screening questions prior to the visit, additional usage of staff PPE, and extensive cleaning of exam room while observing appropriate contact time as indicated for disinfecting solutions.   Assessment & Plan:  Visit time 15 minutes in face to face  communication with patient and coordination of care, additional 10 minutes spent in record review, coordination or care, ordering tests, communicating/referring to other healthcare professionals, documenting in medical records all on the same day of the visit for total time 25 minutes spent on the visit.

## 2021-06-05 DIAGNOSIS — R519 Headache, unspecified: Secondary | ICD-10-CM | POA: Insufficient documentation

## 2021-06-05 NOTE — Assessment & Plan Note (Signed)
Discussed with patient that this could have been related to allergens/season change. She is improved. Avoid medication if not needed to avoid rebound headaches.

## 2021-07-01 ENCOUNTER — Other Ambulatory Visit: Payer: Self-pay | Admitting: Internal Medicine

## 2021-07-20 ENCOUNTER — Other Ambulatory Visit: Payer: Self-pay | Admitting: Internal Medicine

## 2021-07-30 DIAGNOSIS — H401134 Primary open-angle glaucoma, bilateral, indeterminate stage: Secondary | ICD-10-CM | POA: Diagnosis not present

## 2021-08-24 DIAGNOSIS — L820 Inflamed seborrheic keratosis: Secondary | ICD-10-CM | POA: Diagnosis not present

## 2021-08-24 DIAGNOSIS — R208 Other disturbances of skin sensation: Secondary | ICD-10-CM | POA: Diagnosis not present

## 2021-08-24 DIAGNOSIS — L920 Granuloma annulare: Secondary | ICD-10-CM | POA: Diagnosis not present

## 2021-08-24 DIAGNOSIS — L304 Erythema intertrigo: Secondary | ICD-10-CM | POA: Diagnosis not present

## 2021-09-08 ENCOUNTER — Other Ambulatory Visit: Payer: Self-pay | Admitting: Internal Medicine

## 2021-09-13 DIAGNOSIS — N390 Urinary tract infection, site not specified: Secondary | ICD-10-CM | POA: Diagnosis not present

## 2021-09-13 DIAGNOSIS — N958 Other specified menopausal and perimenopausal disorders: Secondary | ICD-10-CM | POA: Diagnosis not present

## 2021-09-13 DIAGNOSIS — B9620 Unspecified Escherichia coli [E. coli] as the cause of diseases classified elsewhere: Secondary | ICD-10-CM | POA: Diagnosis not present

## 2021-09-13 DIAGNOSIS — N952 Postmenopausal atrophic vaginitis: Secondary | ICD-10-CM | POA: Diagnosis not present

## 2021-09-13 DIAGNOSIS — N3281 Overactive bladder: Secondary | ICD-10-CM | POA: Diagnosis not present

## 2021-09-13 DIAGNOSIS — N812 Incomplete uterovaginal prolapse: Secondary | ICD-10-CM | POA: Diagnosis not present

## 2021-09-13 DIAGNOSIS — N814 Uterovaginal prolapse, unspecified: Secondary | ICD-10-CM | POA: Diagnosis not present

## 2021-09-13 DIAGNOSIS — N3941 Urge incontinence: Secondary | ICD-10-CM | POA: Diagnosis not present

## 2021-10-16 ENCOUNTER — Telehealth: Payer: Medicare HMO

## 2021-10-31 ENCOUNTER — Ambulatory Visit (INDEPENDENT_AMBULATORY_CARE_PROVIDER_SITE_OTHER): Payer: Medicare HMO

## 2021-10-31 DIAGNOSIS — F320 Major depressive disorder, single episode, mild: Secondary | ICD-10-CM

## 2021-10-31 DIAGNOSIS — M8580 Other specified disorders of bone density and structure, unspecified site: Secondary | ICD-10-CM

## 2021-10-31 DIAGNOSIS — K219 Gastro-esophageal reflux disease without esophagitis: Secondary | ICD-10-CM

## 2021-10-31 NOTE — Progress Notes (Signed)
? ?Chronic Care Management ?Pharmacy Note ? ?10/31/2021 ?Name:  Linda [name] MRN:  [id number] DOB:  Aug 29, 1940 ? ?Summary: ?-Patient reports that she has recently started on estrace cream per urogynecologist  - denies any issues since starting  ?-Reports compliance to current medications, denies any issues or concerns  ?-Latest DEXA scan shows mild worsening of bone density - RFN and LFT T-Scores of -2.2, L total radius and ulna -2.4 - FRAX scores of 17% for major osteoporotic fracture and 5.2% for hip fracture  ? ?Recommendations/Changes made from today's visit: ?-Patient has been taking vitamin D,  had stopped taking calcium, recommend resuming calcium and vitamin D supplementation of 1249m and 2000 units daily  ?-Discussed starting alendronate 714monce weekly with patient, agreeable to plan, will reach out to PCP about starting  ?-Patient to continue all other medications, will reach out with any issues or concerns regarding medication therapy  ? ?Subjective: ?Linda TEJADAs an [age].o. year old female who is a primary patient of CrHoyt KochMD.  The CCM team was consulted for assistance with disease management and care coordination needs.   ? ?Engaged with patient by telephone for follow up visit in response to provider referral for pharmacy case management and/or care coordination services.  ? ?Consent to Services:  ?The patient was given information about Chronic Care Management services, agreed to services, and gave verbal consent prior to initiation of services.  Please see initial visit note for detailed documentation.  ? ?Patient Care Team: ?CrHoyt KochMD as PCP - General (Internal Medicine) ?BaMelida QuitterMD as Consulting Physician ?LaPrincess BruinsMD (Obstetrics and Gynecology) ?RaSuella BroadMD (Physical Medicine and Rehabilitation) ?BoRondel OhMD (Ophthalmology) ?LyKaty ApoMD (Ophthalmology) ?ElKristeen MissMD as Consulting Physician  (Neurosurgery) ?Pyrtle, JaLajuan LinesMD as Consulting Physician (Gastroenterology) ?Lemmons (Dentistry) ?Foltanski, LiCleaster CorinRPEastside End[facility] Center PLLCs Pharmacist (Pharmacist) ? ? ?Recent office visits: ?10/24/20222 - Dr. CrSharlet Salina headaches - trial of APAP and zyrtex  ?04/27/2021 - Dr. CrSharlet Salina bladder prolapse and urinary issues - - referral to urogynecologist  ? ?Recent consult visits: ?07/30/2021 - Dr. BoEdilia Bo [facility] no changes to medications at this time  ? ?Hospital visits: ?None in previous 6 months ? ?Objective: ? ?Lab Results  ?Component Value Date  ? CREATININE 0.76 07/27/2020  ? BUN 15 07/27/2020  ? GFR 74.26 07/27/2020  ? GFRNONAA 85.23 02/27/2010  ? NA 135 07/27/2020  ? K 3.7 07/27/2020  ? CALCIUM 9.4 07/27/2020  ? CO2 27 07/27/2020  ? ? ?Lab Results  ?Component Value Date/Time  ? HGBA1C 6.0 01/01/2021 03:31 PM  ? GFR 74.26 07/27/2020 02:49 PM  ? GFR 98.31 09/07/2019 10:57 AM  ?  ?Last diabetic Eye exam: No results found for: HMDIABEYEEXA  ?Last diabetic Foot exam: No results found for: HMDIABFOOTEX  ? ?Lab Results  ?Component Value Date  ? CHOL 185 07/27/2020  ? HDL 61.20 07/27/2020  ? LDLCALC 101 (H) 07/27/2020  ? LDLDIRECT 111.0 11/24/2017  ? TRIG 115.0 07/27/2020  ? CHOLHDL 3 07/27/2020  ? ? ? ?  Latest Ref Rng & Units 07/27/2020  ?  2:49 PM 09/07/2019  ? 10:57 AM 08/25/2018  ?  3:32 PM  ?Hepatic Function  ?Total Protein 6.0 - 8.3 g/dL 7.3   7.0   7.1    ?Albumin 3.5 - 5.2 g/dL 4.5   4.3   4.6    ?AST 0 - 37 U/L 18   14  16    ?ALT 0 - 35 U/L 13   12   13     ?Alk Phosphatase 39 - 117 U/L 85   100   59    ?Total Bilirubin 0.2 - 1.2 mg/dL 0.4   0.5   0.4    ? ? ?Lab Results  ?Component Value Date/Time  ? TSH 1.87 01/01/2021 03:31 PM  ? TSH 1.39 07/27/2020 02:49 PM  ? ? ? ?  Latest Ref Rng & Units 07/27/2020  ?  2:49 PM 09/07/2019  ? 10:57 AM 08/25/2018  ?  3:32 PM  ?CBC  ?WBC 4.0 - 10.5 K/uL 7.7   10.3   7.4    ?Hemoglobin 12.0 - 15.0 g/dL 13.0   12.4   12.8    ?Hematocrit 36.0 - 46.0 % 39.2   36.9   38.6    ?Platelets  150.0 - 400.0 K/uL 220.0   330.0   213.0    ? ? ?Lab Results  ?Component Value Date/Time  ? VD25OH 20.54 (L) 07/27/2020 02:49 PM  ? ? ?Clinical ASCVD: No  ?The ASCVD Risk score (Arnett DK, et al., 2019) failed to calculate for the following reasons: ?  The 2019 ASCVD risk score is only valid for ages 61 to 58   ? ? ?  04/27/2021  ?  3:07 PM 01/01/2021  ?  3:06 PM 12/01/2020  ?  3:35 PM  ?Depression screen PHQ 2/9  ?Decreased Interest 0 0 0  ?Down, Depressed, Hopeless  0 0  ?PHQ - 2 Score 0 0 0  ?Altered sleeping 0 0   ?Tired, decreased energy 0 0   ?Change in appetite 0 0   ?Feeling bad or failure about yourself  0 0   ?Trouble concentrating 0 0   ?Moving slowly or fidgety/restless 0 0   ?Suicidal thoughts 0 0   ?PHQ-9 Score 0 0   ?  ? ?  08/25/2018  ?  3:21 PM  ?GAD 7 : Generalized Anxiety Score  ?Nervous, Anxious, on Edge 2  ?Control/stop worrying 1  ?Worry too much - different things 0  ?Trouble relaxing 0  ?Restless 0  ?Easily annoyed or irritable 1  ?Afraid - awful might happen 1  ?Total GAD 7 Score 5  ? ? ?Social History  ? ?Tobacco Use  ?Smoking Status Former  ? Types: Cigarettes  ? Quit date: 08/12/1988  ? Years since quitting: 33.2  ?Smokeless Tobacco Never  ? ?BP Readings from Last 3 Encounters:  ?06/04/21 130/64  ?04/27/21 128/70  ?01/01/21 124/80  ? ?Pulse Readings from Last 3 Encounters:  ?06/04/21 70  ?04/27/21 74  ?01/01/21 70  ? ?Wt Readings from Last 3 Encounters:  ?06/04/21 137 lb (62.1 kg)  ?04/27/21 139 lb (63 kg)  ?01/01/21 136 lb 12.8 oz (62.1 kg)  ? ?BMI Readings from Last 3 Encounters:  ?06/04/21 25.06 kg/m?  ?04/27/21 25.42 kg/m?  ?01/01/21 25.02 kg/m?  ? ? ?Assessment/Interventions: Review of patient past medical history, allergies, medications, health status, including review of consultants reports, laboratory and other test data, was performed as part of comprehensive evaluation and provision of chronic care management services.  ? ?SDOH:  (Social Determinants of Health) assessments and  interventions performed: Yes ? ?SDOH Screenings  ? ?Alcohol Screen: Low Risk   ? Last Alcohol Screening Score (AUDIT): 2  ?Depression (PHQ2-9): Low Risk   ? PHQ-2 Score: 0  ?Financial Resource Strain: Low Risk   ? Difficulty of Paying Living Expenses: Not hard  at all  ?Food Insecurity: No Food Insecurity  ? Worried About Charity fundraiser in the Last Year: Never true  ? Ran Out of Food in the Last Year: Never true  ?Housing: Low Risk   ? Last Housing Risk Score: 0  ?Physical Activity: Sufficiently Active  ? Days of Exercise per Week: 5 days  ? Minutes of Exercise per Session: 30 min  ?Social Connections: Moderately Integrated  ? Frequency of Communication with Friends and Family: More than three times a week  ? Frequency of Social Gatherings with Friends and Family: More than three times a week  ? Attends Religious Services: More than 4 times per year  ? Active Member of Clubs or Organizations: No  ? Attends Archivist Meetings: More than 4 times per year  ? Marital Status: Widowed  ?Stress: No Stress Concern Present  ? Feeling of Stress : Not at all  ?Tobacco Use: Medium Risk  ? Smoking Tobacco Use: Former  ? Smokeless Tobacco Use: Never  ? Passive Exposure: Not on file  ?Transportation Needs: No Transportation Needs  ? Lack of Transportation (Medical): No  ? Lack of Transportation (Non-Medical): No  ? ? ? ?CCM Care Plan ? ?No Known Allergies ? ?Medications Reviewed Today   ? ? Reviewed by Hoyt Koch, MD (Physician) on 06/04/21 at 1336  Med List Status: <None>  ? ?Medication Order Taking? Sig Documenting Provider Last Dose Status Informant  ?acetaminophen (TYLENOL) 650 MG CR tablet [id number] Yes Take 1,300 mg by mouth 2 (two) times daily. [provider] Taking Active   ?b complex vitamins tablet [id number] Yes Take 1 tablet by mouth daily. [provider] Taking Active   ?Biotin 10000 MCG TABS [id number] Yes Take by mouth. [provider] Taking Active   ?busPIRone  (BUSPAR) 5 MG tablet [id number] Yes TAKE (1) TABLET TWICE A DAY AS NEEDED. Hoyt Koch, MD Taking Active   ?Calcium Carb-Cholecalciferol (CALCIUM-VITAMIN D3) 600-400 MG-UNIT TABS [id number] Yes Take by mouth. Provider,

## 2021-10-31 NOTE — Patient Instructions (Signed)
Visit Information ? ?Following are the goals we discussed today:  ? ?Manage My Medicine  ? ?Timeframe:  Long-Range Goal ?Priority:  Medium ?Start Date:        10/12/20                     ?Expected End Date:    11/01/2022                  ? ?Follow Up Date July 2023 ?  ?- call for medicine refill 2 or 3 days before it runs out ?- call if I am sick and can't take my medicine ?- keep a list of all the medicines I take; vitamins and herbals too ?- use a pillbox to sort medicine  ?-Start taking Calcium-Vitamin D to keep bones healthy ?  ?Why is this important?   ?These steps will help you keep on track with your medicines. ? ?Plan: Telephone follow up appointment with care management team member scheduled for:  4 months ?The patient has been provided with contact information for the care management team and has been advised to call with any health related questions or concerns.  ? ?Tomasa Blase, PharmD ?Clinical Pharmacist, [employer]  ? ?Please call the care guide team at [phone] if you need to cancel or reschedule your appointment.  ? ?Patient verbalizes understanding of instructions and care plan provided today and agrees to view in [place]. Active MyChart status confirmed with patient.   ? ? ?

## 2021-11-02 ENCOUNTER — Other Ambulatory Visit: Payer: Self-pay | Admitting: Internal Medicine

## 2021-11-05 DIAGNOSIS — L920 Granuloma annulare: Secondary | ICD-10-CM | POA: Diagnosis not present

## 2021-11-05 DIAGNOSIS — L298 Other pruritus: Secondary | ICD-10-CM | POA: Diagnosis not present

## 2021-11-05 DIAGNOSIS — B353 Tinea pedis: Secondary | ICD-10-CM | POA: Diagnosis not present

## 2021-11-26 ENCOUNTER — Encounter: Payer: Self-pay | Admitting: Internal Medicine

## 2021-11-26 ENCOUNTER — Ambulatory Visit (INDEPENDENT_AMBULATORY_CARE_PROVIDER_SITE_OTHER): Payer: Medicare HMO | Admitting: Internal Medicine

## 2021-11-26 VITALS — BP 118/72 | HR 67 | Resp 18 | Ht 62.0 in | Wt 138.4 lb

## 2021-11-26 DIAGNOSIS — R69 Illness, unspecified: Secondary | ICD-10-CM | POA: Diagnosis not present

## 2021-11-26 DIAGNOSIS — M8589 Other specified disorders of bone density and structure, multiple sites: Secondary | ICD-10-CM

## 2021-11-26 DIAGNOSIS — K219 Gastro-esophageal reflux disease without esophagitis: Secondary | ICD-10-CM

## 2021-11-26 DIAGNOSIS — D649 Anemia, unspecified: Secondary | ICD-10-CM | POA: Diagnosis not present

## 2021-11-26 DIAGNOSIS — Z0000 Encounter for general adult medical examination without abnormal findings: Secondary | ICD-10-CM | POA: Diagnosis not present

## 2021-11-26 DIAGNOSIS — F3342 Major depressive disorder, recurrent, in full remission: Secondary | ICD-10-CM

## 2021-11-26 DIAGNOSIS — R7303 Prediabetes: Secondary | ICD-10-CM | POA: Diagnosis not present

## 2021-11-26 DIAGNOSIS — F419 Anxiety disorder, unspecified: Secondary | ICD-10-CM

## 2021-11-26 DIAGNOSIS — M8580 Other specified disorders of bone density and structure, unspecified site: Secondary | ICD-10-CM | POA: Insufficient documentation

## 2021-11-26 LAB — COMPREHENSIVE METABOLIC PANEL
ALT: 11 U/L (ref 0–35)
AST: 17 U/L (ref 0–37)
Albumin: 4.6 g/dL (ref 3.5–5.2)
Alkaline Phosphatase: 54 U/L (ref 39–117)
BUN: 14 mg/dL (ref 6–23)
CO2: 29 mEq/L (ref 19–32)
Calcium: 9.4 mg/dL (ref 8.4–10.5)
Chloride: 101 mEq/L (ref 96–112)
Creatinine, Ser: 0.81 mg/dL (ref 0.40–1.20)
GFR: 68.15 mL/min (ref >60.00)
Glucose, Bld: 95 mg/dL (ref 70–99)
Potassium: 4.3 mEq/L (ref 3.5–5.1)
Sodium: 136 mEq/L (ref 135–145)
Total Bilirubin: 0.5 mg/dL (ref 0.2–1.2)
Total Protein: 6.8 g/dL (ref 6.0–8.3)

## 2021-11-26 LAB — CBC
HCT: 37.7 % (ref 36.0–46.0)
Hemoglobin: 12.8 g/dL (ref 12.0–15.0)
MCHC: 33.8 g/dL (ref 30.0–36.0)
MCV: 89.2 fl (ref 78.0–100.0)
Platelets: 209 10*3/uL (ref 150.0–400.0)
RBC: 4.23 Mil/uL (ref 3.87–5.11)
RDW: 14 % (ref 11.5–15.5)
WBC: 6.6 10*3/uL (ref 4.0–10.5)

## 2021-11-26 LAB — LIPID PANEL
Cholesterol: 180 mg/dL (ref 0–200)
HDL: 68.4 mg/dL (ref >39.00)
LDL Cholesterol: 88 mg/dL (ref 0–99)
NonHDL: 111.25
Total CHOL/HDL Ratio: 3
Triglycerides: 115 mg/dL (ref 0.0–149.0)
VLDL: 23 mg/dL (ref 0.0–40.0)

## 2021-11-26 LAB — HEMOGLOBIN A1C: Hgb A1c MFr Bld: 6.1 % (ref 4.6–6.5)

## 2021-11-26 NOTE — Assessment & Plan Note (Signed)
Checking Hga1c and adjust as needed. Diet controlled currently.  ?

## 2021-11-26 NOTE — Assessment & Plan Note (Signed)
Flu shot counseled. Covid-19 counseled. Pneumonia complete. Shingrix complete. Tetanus up to date. Colonoscopy aged out. Mammogram aged out, pap smear aged out and dexa due 2024. Counseled about sun safety and mole surveillance. Counseled about the dangers of distracted driving. Given 10 year screening recommendations.  ? ?

## 2021-11-26 NOTE — Assessment & Plan Note (Signed)
Checking CBC and adjust as needed. No signs of bleeding clinically.  ?

## 2021-11-26 NOTE — Assessment & Plan Note (Signed)
Discussed results from fall 2022 with her and that she does have estimated FRAX 19% all fracture and 4-5% hip fracture. She had not been taking calcium and vitamin D. She does meet treatment threshold. She and I decide to repeat DEXA fall 2024 and if worsening or no improvement proceed with treatment. ?

## 2021-11-26 NOTE — Assessment & Plan Note (Signed)
Uses buspar 5 mg BID prn and not taking often. ?

## 2021-11-26 NOTE — Assessment & Plan Note (Signed)
Control is good on zoloft 100 mg daily. Will continue at current dosage. Has buspar for any needed anxiety 5 mg BID prn.  ?

## 2021-11-26 NOTE — Assessment & Plan Note (Addendum)
Taking omeprazole 40 mg daily and will continue this rx. Well controlled. ?

## 2021-11-26 NOTE — Progress Notes (Signed)
? ?  Subjective:  ? ?Patient ID: Linda [name], female    DOB: May 04, 1941, 81 y.o.   MRN: [id number] ? ?HPI ?The patient is here for physical. ? ?PMH, [place] [facility], social history reviewed and updated ? ?Review of Systems  ?Constitutional: Negative.   ?HENT: Negative.    ?Eyes: Negative.   ?Respiratory:  Negative for cough, chest tightness and shortness of breath.   ?Cardiovascular:  Negative for chest pain, palpitations and leg swelling.  ?Gastrointestinal:  Negative for abdominal distention, abdominal pain, constipation, diarrhea, nausea and vomiting.  ?Musculoskeletal: Negative.   ?Skin: Negative.   ?Neurological: Negative.   ?Psychiatric/Behavioral: Negative.    ? ?Objective:  ?Physical Exam ?Constitutional:   ?   Appearance: She is well-developed.  ?HENT:  ?   Head: Normocephalic and atraumatic.  ?Cardiovascular:  ?   Rate and Rhythm: Normal rate and regular rhythm.  ?Pulmonary:  ?   Effort: Pulmonary effort is normal. No respiratory distress.  ?   Breath sounds: Normal breath sounds. No wheezing or rales.  ?Abdominal:  ?   General: Bowel sounds are normal. There is no distension.  ?   Palpations: Abdomen is soft.  ?   Tenderness: There is no abdominal tenderness. There is no rebound.  ?Musculoskeletal:  ?   Cervical back: Normal range of motion.  ?Skin: ?   General: Skin is warm and dry.  ?Neurological:  ?   Mental Status: She is alert and oriented to person, place, and time.  ?   Coordination: Coordination normal.  ? ? ?Vitals:  ? 11/26/21 1317  ?BP: 118/72  ?Pulse: 67  ?Resp: 18  ?SpO2: 97%  ?Weight: 138 lb 6.4 oz (62.8 kg)  ?Height: '5\' 2"'$  (1.575 m)  ? ? ?This visit occurred during the SARS-CoV-2 public health emergency.  Safety protocols were in place, including screening questions prior to the visit, additional usage of staff PPE, and extensive cleaning of exam room while observing appropriate contact time as indicated for disinfecting solutions.  ? ?Assessment & Plan:  ? ?

## 2021-12-03 ENCOUNTER — Ambulatory Visit (INDEPENDENT_AMBULATORY_CARE_PROVIDER_SITE_OTHER): Payer: Medicare HMO

## 2021-12-03 DIAGNOSIS — Z0000 Encounter for general adult medical examination without abnormal findings: Secondary | ICD-10-CM

## 2021-12-03 NOTE — Progress Notes (Signed)
?I connected with Joelene Millin today by telephone and verified that I am speaking with the correct person using two identifiers. ?Location patient: home ?Location provider: work ?Persons participating in the virtual visit: patient, provider. ?  ?I discussed the limitations, risks, security and privacy concerns of performing an evaluation and management service by telephone and the availability of in person appointments. I also discussed with the patient that there may be a patient responsible charge related to this service. The patient expressed understanding and verbally consented to this telephonic visit.  ?  ?Interactive audio and video telecommunications were attempted between this provider and patient, however failed, due to patient having technical difficulties OR patient did not have access to video capability.  We continued and completed visit with audio only. ? ?Some vital signs may be absent or patient reported.  ? ?Time Spent with patient on telephone encounter: 30 minutes ? ?Subjective:  ? AMEILA WELDON is a 81 y.o. female who presents for Medicare Annual (Subsequent) preventive examination. ? ?Review of Systems    ? ?Cardiac Risk Factors include: advanced age (>7mn, >>11women);family history of premature cardiovascular disease;hypertension ? ?   ?Objective:  ?  ?There were no vitals filed for this visit. ?There is no height or weight on file to calculate BMI. ? ? ?  12/03/2021  ? 12:03 PM 12/01/2020  ?  3:36 PM 12/01/2019  ?  2:39 PM 08/24/2019  ? 11:26 AM 08/23/2019  ?  2:01 PM 08/20/2019  ? 12:09 AM 01/21/2019  ?  4:23 PM  ?Advanced Directives  ?Does Patient Have a Medical Advance Directive? Yes Yes Yes Yes Yes No Yes  ?Type of Advance Directive Living will;Healthcare Power of Attorney  Living will;Healthcare Power of ASaxisLiving will Living will;Healthcare Power of ARio VerdeLiving will  ?Does patient want to make changes to medical  advance directive? No - Patient declined No - Patient declined No - Patient declined No - Patient declined No - Patient declined    ?Copy of [place] Chart? Yes - validated most recent copy scanned in chart (See row information)  No - copy requested No - copy requested No - copy requested  No - copy requested  ?Would patient like information on creating a medical advance directive?    No - Patient declined  No - Patient declined   ? ? ?Current Medications (verified) ?Outpatient Encounter Medications as of 12/03/2021  ?Medication Sig  ? acetaminophen (TYLENOL) 650 MG CR tablet Take 1,300 mg by mouth 2 (two) times daily.  ? b complex vitamins tablet Take 1 tablet by mouth daily.  ? Biotin 10000 MCG TABS Take by mouth.  ? busPIRone (BUSPAR) 5 MG tablet TAKE ONE TABLET TWICE DAILY AS NEEDED  ? Calcium-Phosphorus-Vitamin D (CITRACAL +D3 PO) Take by mouth. '600mg'$  +1000 units twice daily  ? dorzolamide-timolol (COSOPT) 22.3-6.8 MG/ML ophthalmic solution Place 1 drop into both eyes 2 (two) times daily.  ? estradiol (ESTRACE) 0.1 MG/GM vaginal cream Place 1 Applicatorful vaginally 2 (two) times a week.  ? hydroxychloroquine (PLAQUENIL) 200 MG tablet Take 300 mg by mouth daily.  ? omeprazole (PRILOSEC) 40 MG capsule TAKE ONE CAPSULE BY MOUTH DAILY  ? sertraline (ZOLOFT) 100 MG tablet TAKE ONE TABLET BY MOUTH ONCE DAILY  ? ?No facility-administered encounter medications on file as of 12/03/2021.  ? ? ?Allergies (verified) ?Patient has no known allergies.  ? ?History: ?Past Medical History:  ?Diagnosis Date  ?  ALLERGIC RHINITIS   ? Allergy   ? ANEMIA-IRON DEFICIENCY   ? Anxiety state, unspecified   ? Carpal tunnel syndrome   ? pt denies   ? Cataract   ? Closed left ankle fracture   ? COLONIC POLYPS, HX OF 2007  ? GERD   ? GLAUCOMA   ? Glaucoma   ? GOITER, MULTINODULAR   ? on [place] 2006, unchanged 6/13 with nodules all <69m  ? HIATAL HERNIA WITH REFLUX   ? Hip dysplasia, acquired   ? [facility]  ? OSTEOARTHRITIS   ? Osteoporosis   ? Serrated polyp of colon   ? TOBACCO USE, QUIT   ? ?Past Surgical History:  ?Procedure Laterality Date  ? BREAST SURGERY  1960  ? Left breast cyst removed no complications  ? CARPAL TUNNEL RELEASE Right   ? COLONOSCOPY  2005  ? EYE SURGERY  1991-1992  ? both eyes  ? EYE SURGERY Left 2014  ? shunt behind left eye  ? ORIF FIBULA FRACTURE Left 08/24/2019  ? Procedure: OPEN REDUCTION INTERNAL FIXATION (ORIF) LEFT DISTAL FIBULA FRACTURE;  Surgeon: L[place]ia Bond MD;  [place];  Laterality: Left;  ? Right shoulder aurgery  10/2008  ? ?Family History  ?Problem Relation Age of Onset  ? Diabetes Mother   ? Heart disease Mother   ? Arthritis Mother   ? Hypertension Mother   ? Arthritis Father   ? Colon cancer Paternal Grandmother   ? Esophageal cancer Neg Hx   ? Rectal cancer Neg Hx   ? Stomach cancer Neg Hx   ? ?Social History  ? ?Socioeconomic History  ? Marital status: Widowed  ?  Spouse name: Not on file  ? Number of children: 2  ? Years of education: Not on file  ? Highest education level: Not on file  ?Occupational History  ? Not on file  ?Tobacco Use  ? Smoking status: Former  ?  Types: Cigarettes  ?  Quit date: 08/12/1988  ?  Years since quitting: 33.3  ? Smokeless tobacco: Never  ?Vaping Use  ? Vaping Use: Never used  ?Substance and Sexual Activity  ? Alcohol use: Yes  ?  Alcohol/week: 0.0 standard drinks  ?  Comment: occassionally  ? Drug use: No  ? Sexual activity: Not Currently  ?  Birth control/protection: Post-menopausal  ?Other Topics Concern  ? Not on file  ?Social History Narrative  ? Married,widowed; [place]  ? Camp Director  ? ?Social Determinants of Health  ? ?Financial Resource Strain: Low Risk   ? Difficulty of Paying Living Expenses: Not hard at all  ?Food Insecurity: No Food Insecurity  ? Worried About RCharity fundraiserin the Last Year: Never true  ? Ran Out of Food in the Last  Year: Never true  ?Transportation Needs: No Transportation Needs  ? Lack of Transportation (Medical): No  ? Lack of Transportation (Non-Medical): No  ?Physical Activity: Sufficiently Active  ? Days of Exercise per Week: 5 days  ? Minutes of Exercise per Session: 30 min  ?Stress: No Stress Concern Present  ? Feeling of Stress : Not at all  ?Social Connections: Moderately Integrated  ? Frequency of Communication with Friends and Family: More than three times a week  ? Frequency of Social Gatherings with Friends and Family: More than three times a week  ? Attends Religious Services: More than  4 times per year  ? Active Member of Clubs or Organizations: Yes  ? Attends Archivist Meetings: More than 4 times per year  ? Marital Status: Widowed  ? ? ?Tobacco Counseling ?Counseling given: Not Answered ? ? ?Clinical Intake: ? ?Pre-visit preparation completed: Yes ? ?Pain : No/denies pain ? ?  ? ?Nutritional Risks: None ?Diabetes: No ? ?How often do you need to have someone help you when you read instructions, pamphlets, or other written materials from your doctor or pharmacy?: 1 - Never ?What is the last grade level you completed in school?: Bachelor's Degree ? ?Diabetic? prediabetic ? ?Interpreter Needed?: No ? ?Information entered by :: Lisette Abu, LPN ? ? ?Activities of Daily Living ? ?  12/03/2021  ? 11:48 AM  ?In your present state of health, do you have any difficulty performing the following activities:  ?Hearing? 1  ?Comment wears hearing aids  ?Vision? 0  ?Difficulty concentrating or making decisions? 0  ?Walking or climbing stairs? 0  ?Dressing or bathing? 0  ?Doing errands, shopping? 0  ?Preparing Food and eating ? N  ?Using the Toilet? N  ?In the past six months, have you accidently leaked urine? Y  ?Comment history of UTI  ?Do you have problems with loss of bowel control? N  ?Managing your Medications? N  ?Managing your Finances? N  ?Housekeeping or managing your Housekeeping? N  ? ? ?Patient Care  Team: ?Hoyt Koch, MD as PCP - General (Internal Medicine) ?Melida Quitter, MD as Consulting Physician ?Princess Bruins, MD (Obstetrics and Gynecology) ?Suella Broad, MD (Physical Medicine an

## 2021-12-03 NOTE — Patient Instructions (Signed)
Ms. Maenza , ?Thank you for taking time to come for your Medicare Wellness Visit. I appreciate your ongoing commitment to your health goals. Please review the following plan we discussed and let me know if I can assist you in the future.  ? ?Screening recommendations/referrals: ?Colonoscopy: Not a candidate for screening due to age; last done 06/28/2015 ?Mammogram: 04/05/2021; due every year ?Bone Density: 04/23/2021; due every 2-3 years ?Recommended yearly ophthalmology/optometry visit for glaucoma screening and checkup ?Recommended yearly dental visit for hygiene and checkup ? ?Vaccinations: ?Influenza vaccine: 06/02/2021; due every Fall Season  ?Pneumococcal vaccine: 07/12/2011, 08/10/2013; need Prevnar20 ?Tdap vaccine: 02/23/2014; due every 10 years  ?Shingles vaccine: 03/01/2019, 06/01/19   ?Covid-19: 10/05/2019, 10/26/2019, 05/22/2020 ? ?Advanced directives: Yes; documents on file. ? ?Conditions/risks identified: Yes ? ?Next appointment: Please schedule your next Medicare Wellness Visit with your Nurse Health Advisor in 1 year by calling [phone]. ? ? ?Preventive Care 45 Years and Older, Female ?Preventive care refers to lifestyle choices and visits with your health care provider that can promote health and wellness. ?What does preventive care include? ?A yearly physical exam. This is also called an annual well check. ?Dental exams once or twice a year. ?Routine eye exams. Ask your health care provider how often you should have your eyes checked. ?Personal lifestyle choices, including: ?Daily care of your teeth and gums. ?Regular physical activity. ?Eating a healthy diet. ?Avoiding tobacco and drug use. ?Limiting alcohol use. ?Practicing safe sex. ?Taking low-dose aspirin every day. ?Taking vitamin and mineral supplements as recommended by your health care provider. ?What happens during an annual well check? ?The services and screenings done by your health care provider during your annual well check will depend  on your age, overall health, lifestyle risk factors, and family history of disease. ?Counseling  ?Your health care provider may ask you questions about your: ?Alcohol use. ?Tobacco use. ?Drug use. ?Emotional well-being. ?Home and relationship well-being. ?Sexual activity. ?Eating habits. ?History of falls. ?Memory and ability to understand (cognition). ?Work and work Statistician. ?Reproductive health. ?Screening  ?You may have the following tests or measurements: ?Height, weight, and BMI. ?Blood pressure. ?Lipid and cholesterol levels. These may be checked every 5 years, or more frequently if you are over 76 years old. ?Skin check. ?Lung cancer screening. You may have this screening every year starting at age 30 if you have a 30-pack-year history of smoking and currently smoke or have quit within the past 15 years. ?Fecal occult blood test (FOBT) of the stool. You may have this test every year starting at age 80. ?Flexible sigmoidoscopy or colonoscopy. You may have a sigmoidoscopy every 5 years or a colonoscopy every 10 years starting at age 19. ?Hepatitis C blood test. ?Hepatitis B blood test. ?Sexually transmitted disease (STD) testing. ?Diabetes screening. This is done by checking your blood sugar (glucose) after you have not eaten for a while (fasting). You may have this done every 1-3 years. ?Bone density scan. This is done to screen for osteoporosis. You may have this done starting at age 69. ?Mammogram. This may be done every 1-2 years. Talk to your health care provider about how often you should have regular mammograms. ?Talk with your health care provider about your test results, treatment options, and if necessary, the need for more tests. ?Vaccines  ?Your health care provider may recommend certain vaccines, such as: ?Influenza vaccine. This is recommended every year. ?Tetanus, diphtheria, and acellular pertussis (Tdap, Td) vaccine. You may need a Td booster every 10 years. ?  Zoster vaccine. You may need  this after age 96. ?Pneumococcal 13-valent conjugate (PCV13) vaccine. One dose is recommended after age 46. ?Pneumococcal polysaccharide (PPSV23) vaccine. One dose is recommended after age 9. ?Talk to your health care provider about which screenings and vaccines you need and how often you need them. ?This information is not intended to replace advice given to you by your health care provider. Make sure you discuss any questions you have with your health care provider. ?Document Released: 08/25/2015 Document Revised: 04/17/2016 Document Reviewed: 05/30/2015 ?Elsevier Interactive Patient Education ? 2017 [place]. ? ?Fall Prevention in the Home ?Falls can cause injuries. They can happen to people of all ages. There are many things you can do to make your home safe and to help prevent falls. ?What can I do on the outside of my home? ?Regularly fix the edges of walkways and driveways and fix any cracks. ?Remove anything that might make you trip as you walk through a door, such as a raised step or threshold. ?Trim any bushes or trees on the path to your home. ?Use bright outdoor lighting. ?Clear any walking paths of anything that might make someone trip, such as rocks or tools. ?Regularly check to see if handrails are loose or broken. Make sure that both sides of any steps have handrails. ?Any raised decks and porches should have guardrails on the edges. ?Have any leaves, snow, or ice cleared regularly. ?Use sand or salt on walking paths during winter. ?Clean up any spills in your garage right away. This includes oil or grease spills. ?What can I do in the bathroom? ?Use night lights. ?Install grab bars by the toilet and in the tub and shower. Do not use towel bars as grab bars. ?Use non-skid mats or decals in the tub or shower. ?If you need to sit down in the shower, use a plastic, non-slip stool. ?Keep the floor dry. Clean up any water that spills on the floor as soon as it happens. ?Remove soap buildup in the tub  or shower regularly. ?Attach bath mats securely with double-sided non-slip rug tape. ?Do not have throw rugs and other things on the floor that can make you trip. ?What can I do in the bedroom? ?Use night lights. ?Make sure that you have a light by your bed that is easy to reach. ?Do not use any sheets or blankets that are too big for your bed. They should not hang down onto the floor. ?Have a firm chair that has side arms. You can use this for support while you get dressed. ?Do not have throw rugs and other things on the floor that can make you trip. ?What can I do in the kitchen? ?Clean up any spills right away. ?Avoid walking on wet floors. ?Keep items that you use a lot in easy-to-reach places. ?If you need to reach something above you, use a strong step stool that has a grab bar. ?Keep electrical cords out of the way. ?Do not use floor polish or wax that makes floors slippery. If you must use wax, use non-skid floor wax. ?Do not have throw rugs and other things on the floor that can make you trip. ?What can I do with my stairs? ?Do not leave any items on the stairs. ?Make sure that there are handrails on both sides of the stairs and use them. Fix handrails that are broken or loose. Make sure that handrails are as long as the stairways. ?Check any carpeting to make sure that  it is firmly attached to the stairs. Fix any carpet that is loose or worn. ?Avoid having throw rugs at the top or bottom of the stairs. If you do have throw rugs, attach them to the floor with carpet tape. ?Make sure that you have a light switch at the top of the stairs and the bottom of the stairs. If you do not have them, ask someone to add them for you. ?What else can I do to help prevent falls? ?Wear shoes that: ?Do not have high heels. ?Have rubber bottoms. ?Are comfortable and fit you well. ?Are closed at the toe. Do not wear sandals. ?If you use a stepladder: ?Make sure that it is fully opened. Do not climb a closed stepladder. ?Make  sure that both sides of the stepladder are locked into place. ?Ask someone to hold it for you, if possible. ?Clearly mark and make sure that you can see: ?Any grab bars or handrails. ?First and last ste

## 2021-12-05 DIAGNOSIS — M25531 Pain in right wrist: Secondary | ICD-10-CM | POA: Diagnosis not present

## 2021-12-05 DIAGNOSIS — M13839 Other specified arthritis, unspecified wrist: Secondary | ICD-10-CM | POA: Diagnosis not present

## 2021-12-05 DIAGNOSIS — M654 Radial styloid tenosynovitis [de Quervain]: Secondary | ICD-10-CM | POA: Diagnosis not present

## 2021-12-05 DIAGNOSIS — M13831 Other specified arthritis, right wrist: Secondary | ICD-10-CM | POA: Diagnosis not present

## 2021-12-13 DIAGNOSIS — Z961 Presence of intraocular lens: Secondary | ICD-10-CM | POA: Diagnosis not present

## 2021-12-13 DIAGNOSIS — H401133 Primary open-angle glaucoma, bilateral, severe stage: Secondary | ICD-10-CM | POA: Diagnosis not present

## 2021-12-13 DIAGNOSIS — H52203 Unspecified astigmatism, bilateral: Secondary | ICD-10-CM | POA: Diagnosis not present

## 2021-12-13 DIAGNOSIS — H524 Presbyopia: Secondary | ICD-10-CM | POA: Diagnosis not present

## 2021-12-21 ENCOUNTER — Ambulatory Visit: Payer: Medicare HMO

## 2022-01-02 ENCOUNTER — Telehealth (INDEPENDENT_AMBULATORY_CARE_PROVIDER_SITE_OTHER): Payer: Medicare HMO | Admitting: Internal Medicine

## 2022-01-02 ENCOUNTER — Telehealth: Payer: Self-pay

## 2022-01-02 DIAGNOSIS — U071 COVID-19: Secondary | ICD-10-CM

## 2022-01-02 MED ORDER — NIRMATRELVIR/RITONAVIR (PAXLOVID)TABLET: 3.0000 | ORAL_TABLET | Freq: Two times a day (BID) | ORAL | 0 refills | Status: AC

## 2022-01-02 NOTE — Telephone Encounter (Signed)
Spoke to [facility](Chasity). She confirm that PAXLOVID issues resolved and filled.

## 2022-01-02 NOTE — Progress Notes (Signed)
Virtual Visit via Video Note  I connected with Linda [name] on 01/02/22 at  3:30 PM EDT by a video enabled telemedicine application and verified that I am speaking with the correct person using two identifiers.  Location patient: home Location provider: work office Persons participating in the virtual visit: patient, provider  I discussed the limitations of evaluation and management by telemedicine and the availability of in person appointments. The patient expressed understanding and agreed to proceed.   HPI: She is calling to inform us that she tested positive for COVID on 5/23.  The day prior to that she had been experiencing body aches, fever, headache, chills, cough, sneezing, congestion, rhinorrhea.  She is currently visiting family in [place].   ROS: Constitutional: Denies fever, chills, diaphoresis,. HEENT: Denies photophobia, eye pain, redness, mouth sores, trouble swallowing, neck pain, neck stiffness and tinnitus.   Respiratory: Denies SOB, DOE, chest tightness,  and wheezing.   Cardiovascular: Denies chest pain, palpitations and leg swelling.  Gastrointestinal: Denies nausea, vomiting, abdominal pain, diarrhea, constipation, blood in stool and abdominal distention.  Genitourinary: Denies dysuria, urgency, frequency, hematuria, flank pain and difficulty urinating.  Endocrine: Denies: hot or cold intolerance, sweats, changes in hair or nails, polyuria, polydipsia. Musculoskeletal: Denies myalgias, back pain, joint swelling, arthralgias and gait problem.  Skin: Denies pallor, rash and wound.  Neurological: Denies dizziness, seizures, syncope, weakness, light-headedness, numbness and headaches.  Hematological: Denies adenopathy. Easy bruising, personal or family bleeding history  Psychiatric/Behavioral: Denies suicidal ideation, mood changes, confusion, nervousness, sleep disturbance and agitation   Past Medical History:  Diagnosis Date   ALLERGIC RHINITIS     Allergy    ANEMIA-IRON DEFICIENCY    Anxiety state, unspecified    Carpal tunnel syndrome    pt denies    Cataract    Closed left ankle fracture    COLONIC POLYPS, HX OF 2007   GERD    GLAUCOMA    Glaucoma    GOITER, MULTINODULAR    on [place] 2006, unchanged 6/13 with nodules all <73m   HIATAL HERNIA WITH REFLUX    Hip dysplasia, acquired    [facility]   OSTEOARTHRITIS    Osteoporosis    Serrated polyp of colon    TOBACCO USE, QUIT     Past Surgical History:  Procedure Laterality Date   BREAST SURGERY  1960   Left breast cyst removed no complications   CARPAL TUNNEL RELEASE Right    COLONOSCOPY  2005   EYE SURGERY  1991-1992   both eyes   EYE SURGERY Left 2014   shunt behind left eye   ORIF FIBULA FRACTURE Left 08/24/2019   Procedure: OPEN REDUCTION INTERNAL FIXATION (ORIF) LEFT DISTAL FIBULA FRACTURE;  Surgeon: LMarchia Bond MD;  Location: [facility]  Service: Orthopedics;  Laterality: Left;   Right shoulder aurgery  10/2008    Family History  Problem Relation Age of Onset   Diabetes Mother    Heart disease Mother    Arthritis Mother    Hypertension Mother    Arthritis Father    Colon cancer Paternal Grandmother    Esophageal cancer Neg Hx    Rectal cancer Neg Hx    Stomach cancer Neg Hx     SOCIAL HX:   reports that she quit smoking about 33 years ago. Her smoking use included cigarettes. She has never used smokeless tobacco. She reports current alcohol use. She reports that  she does not use drugs.   Current Outpatient Medications:    acetaminophen (TYLENOL) 650 MG CR tablet, Take 1,300 mg by mouth 2 (two) times daily., Disp: , Rfl:    b complex vitamins tablet, Take 1 tablet by mouth daily., Disp: , Rfl:    Biotin 10000 MCG TABS, Take by mouth., Disp: , Rfl:    busPIRone (BUSPAR) 5 MG tablet, TAKE ONE TABLET TWICE DAILY AS NEEDED, Disp: 90 tablet, Rfl: 0   Calcium-Phosphorus-Vitamin D (CITRACAL +D3 PO), Take  by mouth. '600mg'$  +1000 units twice daily, Disp: , Rfl:    dorzolamide-timolol (COSOPT) 22.3-6.8 MG/ML ophthalmic solution, Place 1 drop into both eyes 2 (two) times daily., Disp: , Rfl:    nirmatrelvir/ritonavir EUA (PAXLOVID) 20 x 150 MG & 10 x '100MG'$  TABS, Take 3 tablets by mouth 2 (two) times daily for 5 days. (Take nirmatrelvir 150 mg two tablets twice daily for 5 days and ritonavir 100 mg one tablet twice daily for 5 days) Patient GFR is 68, Disp: 30 tablet, Rfl: 0   estradiol (ESTRACE) 0.1 MG/GM vaginal cream, Place 1 Applicatorful vaginally 2 (two) times a week. (Patient not taking: Reported on 01/02/2022), Disp: , Rfl:    hydroxychloroquine (PLAQUENIL) 200 MG tablet, Take 300 mg by mouth daily. (Patient not taking: Reported on 01/02/2022), Disp: , Rfl:    omeprazole (PRILOSEC) 40 MG capsule, TAKE ONE CAPSULE BY MOUTH DAILY (Patient not taking: Reported on 01/02/2022), Disp: 90 capsule, Rfl: 1   sertraline (ZOLOFT) 100 MG tablet, TAKE ONE TABLET BY MOUTH ONCE DAILY (Patient not taking: Reported on 01/02/2022), Disp: 90 tablet, Rfl: 0  EXAM:   VITALS per patient if applicable: None reported  GENERAL: alert, oriented, appears well and in no acute distress, sounds congested  HEENT: atraumatic, conjunttiva clear, no obvious abnormalities on inspection of external nose and ears, wears corrective lenses  NECK: normal movements of the head and neck  LUNGS: on inspection no signs of respiratory distress, breathing rate appears normal, no obvious gross increased work of breathing, gasping or wheezing  CV: no obvious cyanosis  MS: moves all visible extremities without noticeable abnormality  PSYCH/NEURO: pleasant and cooperative, no obvious depression or anxiety, speech and thought processing grossly intact  ASSESSMENT AND PLAN:   COVID-19  - Plan: nirmatrelvir/ritonavir EUA (PAXLOVID) 20 x 150 MG & 10 x '100MG'$  TABS -She may also use OTC medications such as antihistamines, decongestants, pain  relievers, guaifenesin. -We have reviewed quarantine period of 5 days. -We have discussed symptoms that would promote ED evaluation. -She knows to follow with [place] if symptoms fail to resolve.      I discussed the assessment and treatment plan with the patient. The patient was provided an opportunity to ask questions and all were answered. The patient agreed with the plan and demonstrated an understanding of the instructions.   The patient was advised to call back or seek an in-person evaluation if the symptoms worsen or if the condition fails to improve as anticipated.    Lelon Frohlich, MD  [facility]

## 2022-01-20 ENCOUNTER — Other Ambulatory Visit: Payer: Self-pay | Admitting: Internal Medicine

## 2022-01-28 DIAGNOSIS — H401134 Primary open-angle glaucoma, bilateral, indeterminate stage: Secondary | ICD-10-CM | POA: Diagnosis not present

## 2022-02-05 ENCOUNTER — Other Ambulatory Visit: Payer: Self-pay | Admitting: Internal Medicine

## 2022-02-27 ENCOUNTER — Telehealth: Payer: Medicare HMO

## 2022-03-08 DIAGNOSIS — L920 Granuloma annulare: Secondary | ICD-10-CM | POA: Diagnosis not present

## 2022-03-08 DIAGNOSIS — L304 Erythema intertrigo: Secondary | ICD-10-CM | POA: Diagnosis not present

## 2022-03-08 DIAGNOSIS — L300 Nummular dermatitis: Secondary | ICD-10-CM | POA: Diagnosis not present

## 2022-03-08 DIAGNOSIS — D692 Other nonthrombocytopenic purpura: Secondary | ICD-10-CM | POA: Diagnosis not present

## 2022-03-11 ENCOUNTER — Other Ambulatory Visit: Payer: Self-pay | Admitting: Internal Medicine

## 2022-04-11 DIAGNOSIS — Z1231 Encounter for screening mammogram for malignant neoplasm of breast: Secondary | ICD-10-CM | POA: Diagnosis not present

## 2022-04-16 IMAGING — CT CT ANKLE*L* W/O CM
3 of 4 series · 14 of 33 positions shown, 16 images · non-contrast
Comparison: None.

CLINICAL DATA: History of left ankle ORIF August 2019.

EXAM:
CT OF THE LEFT ANKLE WITHOUT CONTRAST
TECHNIQUE: Multidetector CT imaging of the left ankle was performed according
to the standard protocol. Multiplanar CT image reconstructions were
also generated.

[Series 4: soft tissue lower extremity (person_name) · axial · 0.35mm/px · z∈[-215,-73]mm · 6 of 94 slices shown, 8 images]
[im 15/94  soft-tissue]
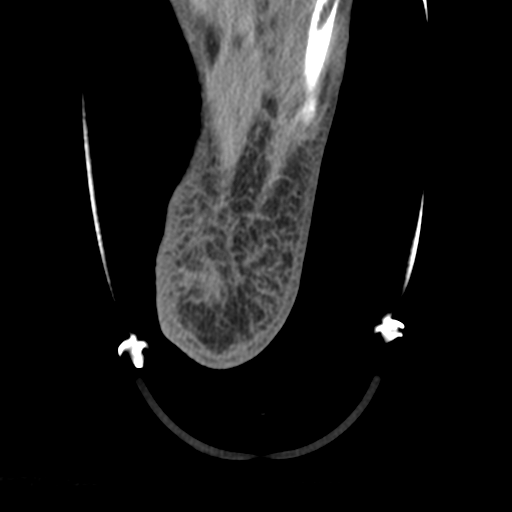
[im 15/94  bone]
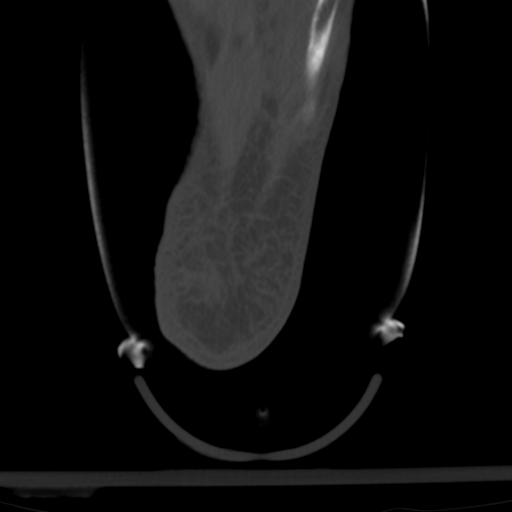
[im 29/94  bone]
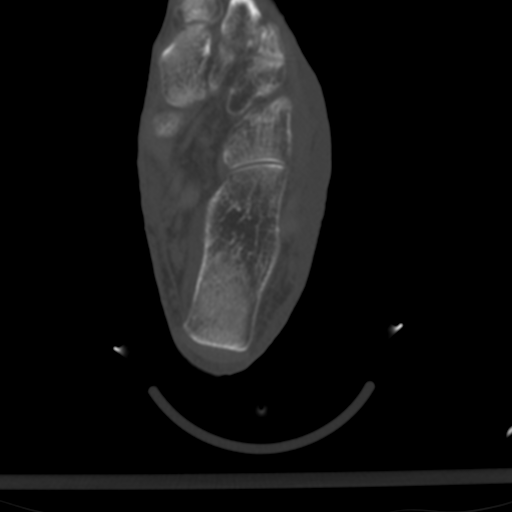
[im 43/94  bone]
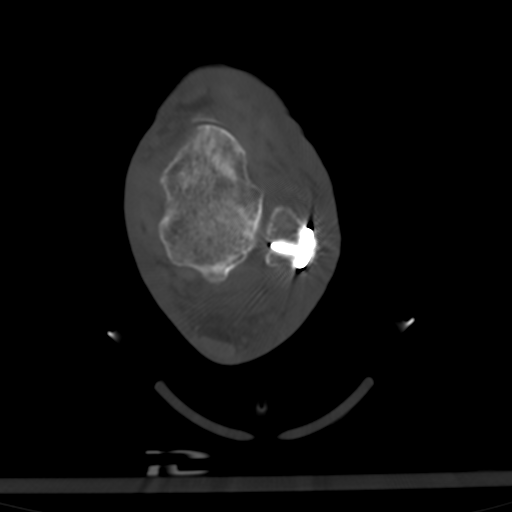
[im 58/94  bone]
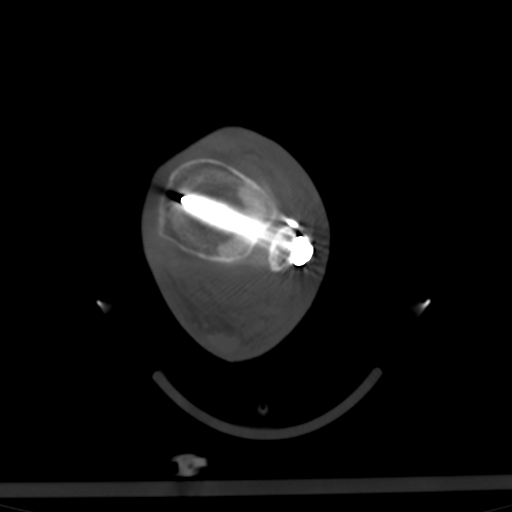
[im 72/94  soft-tissue]
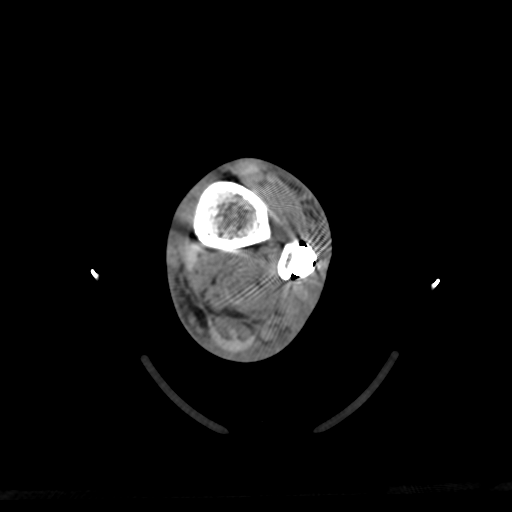
[im 72/94  bone]
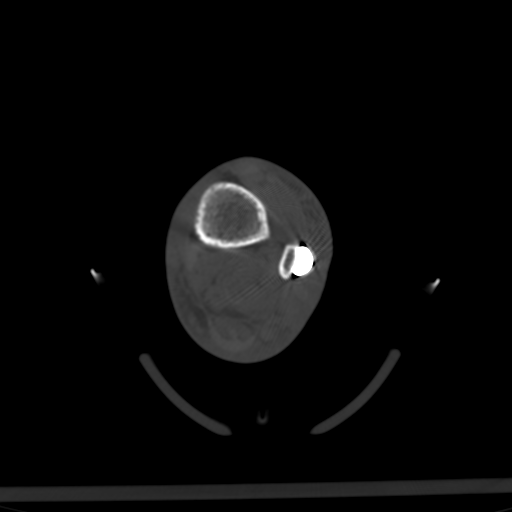
[im 86/94  bone]
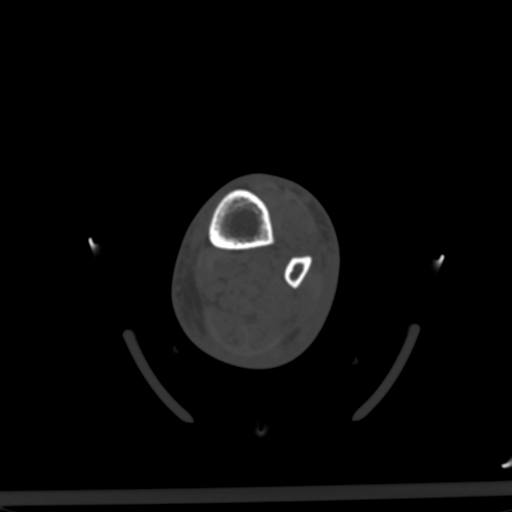

[Series 8: sag bone (person_name) · sagittal · 0.27mm/px · 5 of 76 slices shown]
[im 11/76  bone]
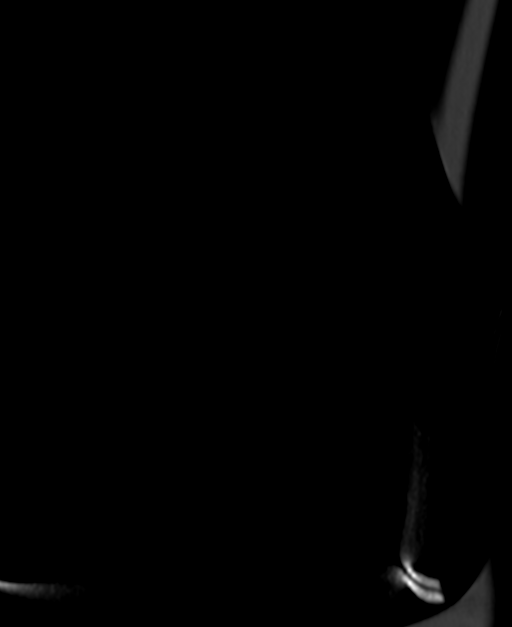
[im 22/76  bone]
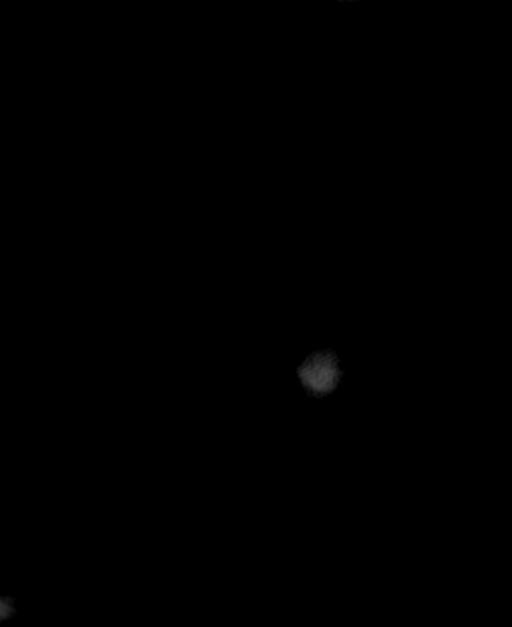
[im 33/76  bone]
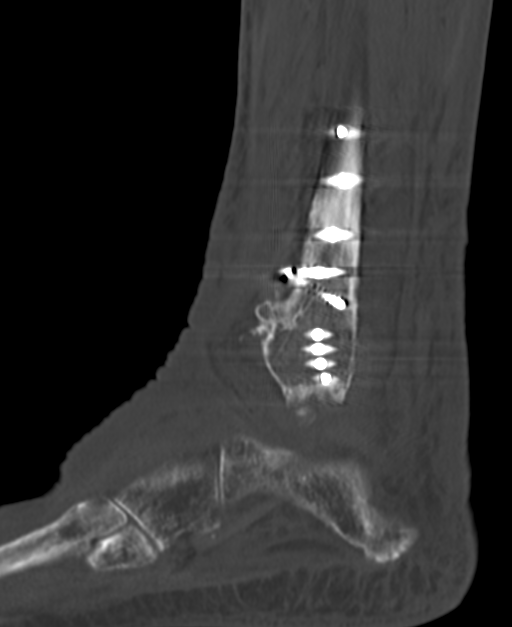
[im 43/76  bone]
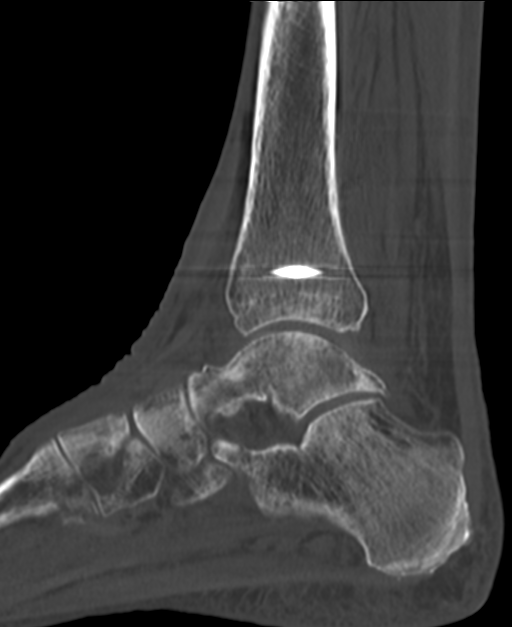
[im 54/76  bone]
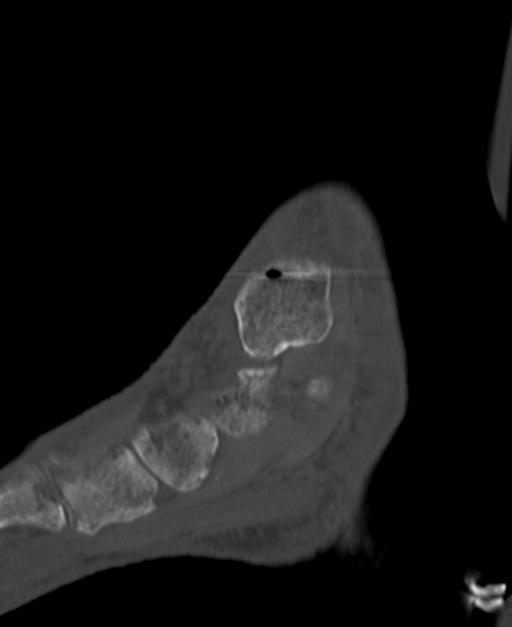

[Series 9: cor soft tissue (person_name) · coronal · 0.21mm/px · 3 of 56 slices shown]
[im 12/56  bone]
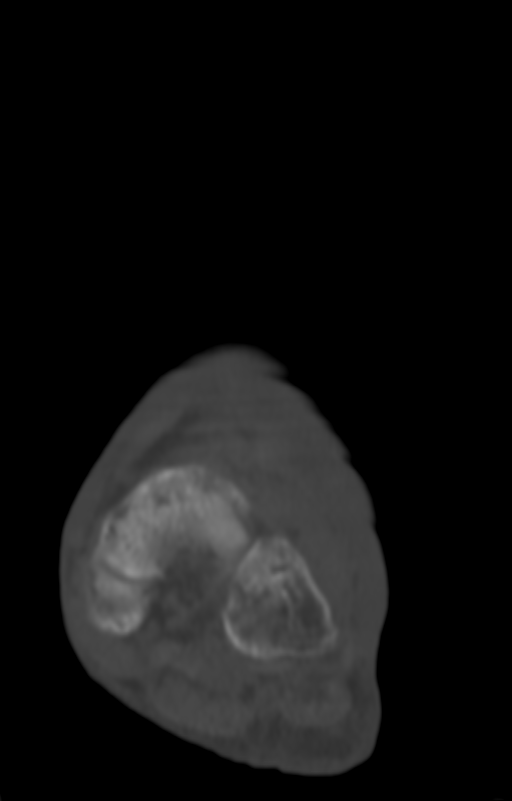
[im 23/56  bone]
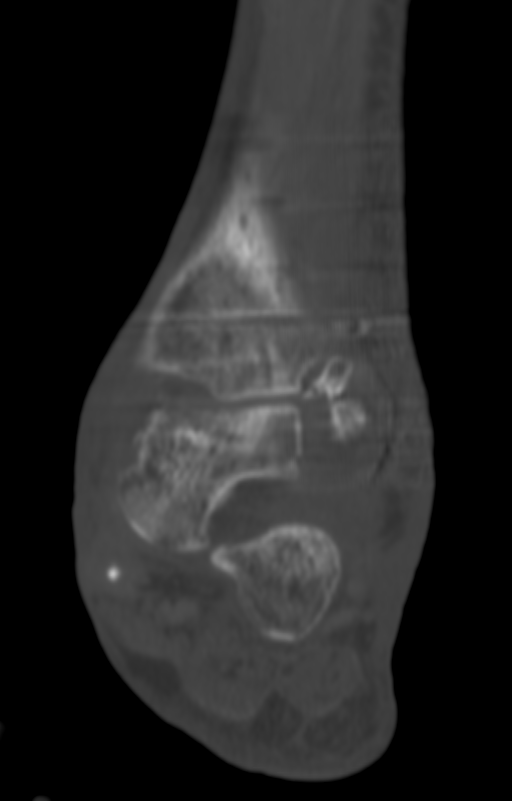
[im 34/56  bone]
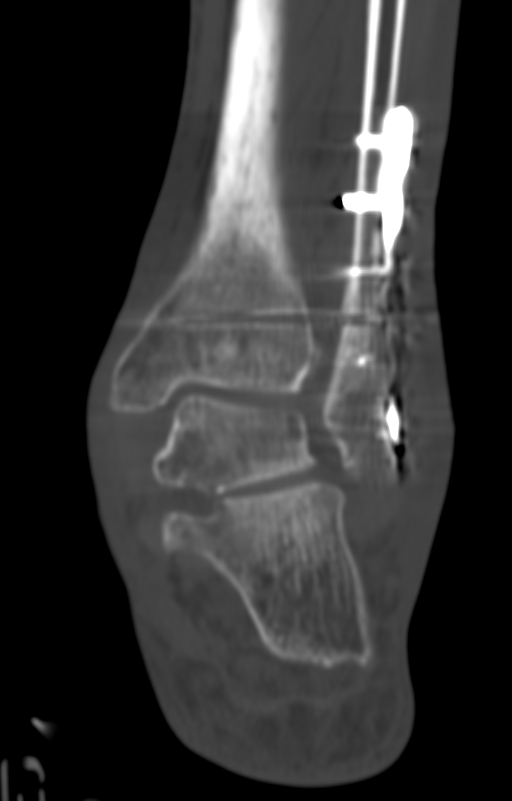

[14 of 33 positions shown; findings below may reference images not displayed]

FINDINGS: Bones/Joint/Cartilage

Severe osteopenia.

Distal lateral fibular sideplate with multiple interlocking screws
transfixing a healed fracture. Single distal tibiofibular
syndesmotic screw in satisfactory position. Ankle mortise is intact.
Alignment is normal.

No acute fracture or dislocation. Tiny sliver of bone in the
posteromedial corner of the tibiotalar joint concerning for a small
loose body. Tiny old avulsion fracture of the anteromedial talar
dome.

No aggressive osseous lesion. Normal alignment. Small ankle joint
effusion.

Ligaments

Ligaments are suboptimally evaluated by CT.

Muscles and Tendons
Muscles are normal. No muscle atrophy. No intramuscular fluid
collection or hematoma. Flexor, extensor, peroneal and Achilles
tendons are grossly intact.

Soft tissue
No fluid collection or hematoma. No soft tissue mass. Severe soft
tissue swelling around ankle and dorsal aspect of the foot extending
into the lower leg.
IMPRESSION: 1. Healed distal lateral fibular sideplate with multiple
interlocking screws transfixing a healed fracture. Single distal
tibiofibular syndesmotic screw in satisfactory position.
2. Tiny sliver of bone in the posteromedial corner of the tibiotalar
joint concerning for a small loose body.
3. Tiny old avulsion fracture of the anteromedial talar dome.
4. Severe soft tissue swelling around ankle and dorsal aspect of the
foot extending into the lower leg which may reflect reactive edema
versus cellulitis.

## 2022-05-15 ENCOUNTER — Other Ambulatory Visit: Payer: Self-pay | Admitting: Internal Medicine

## 2022-05-15 NOTE — Telephone Encounter (Signed)
Last refill 01/22/22. Please advise

## 2022-07-11 DIAGNOSIS — M13839 Other specified arthritis, unspecified wrist: Secondary | ICD-10-CM | POA: Diagnosis not present

## 2022-07-11 DIAGNOSIS — M654 Radial styloid tenosynovitis [de Quervain]: Secondary | ICD-10-CM | POA: Diagnosis not present

## 2022-07-11 DIAGNOSIS — M25531 Pain in right wrist: Secondary | ICD-10-CM | POA: Diagnosis not present

## 2022-07-11 DIAGNOSIS — M13841 Other specified arthritis, right hand: Secondary | ICD-10-CM | POA: Diagnosis not present

## 2022-07-12 ENCOUNTER — Other Ambulatory Visit: Payer: Self-pay | Admitting: Internal Medicine

## 2022-07-25 ENCOUNTER — Telehealth: Payer: Self-pay

## 2022-07-25 NOTE — Telephone Encounter (Signed)
Flu vaccine Updated

## 2022-07-29 ENCOUNTER — Telehealth: Payer: Self-pay | Admitting: Internal Medicine

## 2022-07-29 NOTE — Telephone Encounter (Signed)
See note

## 2022-07-29 NOTE — Telephone Encounter (Signed)
Anyone over 36 can get this at the pharmacy. I do not recommend to get at the same time as other vaccines.

## 2022-07-29 NOTE — Telephone Encounter (Signed)
Patient wants to know if Dr Sharlet Salina wants her to get the RSV shot

## 2022-07-30 NOTE — Telephone Encounter (Signed)
Spoke with the pt and was able to inform her of Dr. Nathanial Millman advice. Pt states she understands and has no questions or concerns

## 2022-08-08 ENCOUNTER — Ambulatory Visit (INDEPENDENT_AMBULATORY_CARE_PROVIDER_SITE_OTHER): Payer: Medicare HMO | Admitting: Emergency Medicine

## 2022-08-08 ENCOUNTER — Encounter: Payer: Self-pay | Admitting: Emergency Medicine

## 2022-08-08 VITALS — BP 150/70 | HR 67 | Temp 97.7°F | Ht 62.0 in | Wt 136.8 lb

## 2022-08-08 DIAGNOSIS — R051 Acute cough: Secondary | ICD-10-CM | POA: Diagnosis not present

## 2022-08-08 DIAGNOSIS — J22 Unspecified acute lower respiratory infection: Secondary | ICD-10-CM

## 2022-08-08 MED ORDER — BENZONATATE 200 MG PO CAPS: 200.0000 mg | ORAL_CAPSULE | Freq: Two times a day (BID) | ORAL | 0 refills | Status: DC | PRN

## 2022-08-08 MED ORDER — AZITHROMYCIN 250 MG PO TABS: ORAL_TABLET | ORAL | 0 refills | Status: DC

## 2022-08-08 NOTE — Patient Instructions (Signed)

## 2022-08-08 NOTE — Assessment & Plan Note (Signed)
Cough management discussed. Start over-the-counter Mucinex DM and cough drops. Advised to stay well-hydrated May benefit from Tessalon 200 mg 3 times daily as needed Advised to contact the office if no better or worse during the next several days.

## 2022-08-08 NOTE — Progress Notes (Signed)
Linda [name] 81 y.o.   Chief Complaint  Patient presents with   Cough    Dry cough, on going for 2 weeks    Nasal Congestion    On going for 2 weeks, no sore throat     HISTORY OF PRESENT ILLNESS: Acute problem visit today.  Patient of Dr. Pricilla Holm. This is a 81 y.o. female complaining of cough and congestion that started 2 weeks ago, progressively getting worse. Denies fever or chills.  Able to eat and drink.  Denies nausea or vomiting.  Denies abdominal pain or diarrhea. Denies difficulty breathing or chest pain. No other associated symptoms. Cough is productive of yellow phlegm No other complaints or medical concerns today.  Cough Pertinent negatives include no chills, fever, headaches or rash.     Prior to Admission medications   Medication Sig Start Date End Date Taking? Authorizing Provider  acetaminophen (TYLENOL) 650 MG CR tablet Take 1,300 mg by mouth 2 (two) times daily.   Yes [provider]  b complex vitamins tablet Take 1 tablet by mouth daily.   Yes [provider]  busPIRone (BUSPAR) 5 MG tablet TAKE ONE TABLET TWICE DAILY AS NEEDED 07/12/22  Yes Hoyt Koch, MD  Calcium-Phosphorus-Vitamin D (CITRACAL +D3 PO) Take by mouth. '600mg'$  +1000 units twice daily   Yes [provider]  dorzolamide-timolol (COSOPT) 22.3-6.8 MG/ML ophthalmic solution Place 1 drop into both eyes 2 (two) times daily.   Yes [provider]  omeprazole (PRILOSEC) 40 MG capsule TAKE ONE CAPSULE BY MOUTH ONCE DAILY 02/05/22  Yes Pyrtle, Lajuan Lines, MD  sertraline (ZOLOFT) 100 MG tablet TAKE ONE TABLET BY MOUTH ONCE DAILY 05/15/22  Yes Hoyt Koch, MD  Biotin 10000 MCG TABS Take by mouth. Patient not taking: Reported on 08/08/2022    [provider]  estradiol (ESTRACE) 0.1 MG/GM vaginal cream Place 1 Applicatorful vaginally 2 (two) times a week. Patient not taking: Reported on 01/02/2022    [provider]   hydroxychloroquine (PLAQUENIL) 200 MG tablet Take 300 mg by mouth daily. Patient not taking: Reported on 01/02/2022 12/21/20   [provider]  triamcinolone cream (KENALOG) 0.1 % Apply 1 application on the skin daily; Taper use as able Patient not taking: Reported on 08/08/2022    [provider]    No Known Allergies  Patient Active Problem List   Diagnosis Date Noted   Osteopenia 11/26/2021   Pre-diabetes 11/26/2021   Skin rash 01/03/2021   Other fatigue 07/28/2020   Left fibular fracture 08/24/2019   Bilateral hand pain 10/21/2018   Dupuytren's disease of palm 01/19/2018   Bilateral sensorineural hearing loss 03/12/2016   Routine general medical examination at a health care facility 07/22/2015   Osteoarthritis of both hips resulting from hip dysplasia 01/25/2015   Primary osteoarthritis of right hip 01/25/2015   Anemia 11/06/2012   Anxiety 11/06/2012   Depression 11/06/2012   Cataract, nuclear 10/28/2012   Primary open angle glaucoma of both eyes, indeterminate stage 10/28/2012   Ptosis of eyelid 10/28/2012   Bladder prolapse, female, acquired 01/09/2012   GERD (gastroesophageal reflux disease) 05/21/2007   Osteoarthritis 05/21/2007    Past Medical History:  Diagnosis Date   ALLERGIC RHINITIS    Allergy    ANEMIA-IRON DEFICIENCY    Anxiety state, unspecified    Carpal tunnel syndrome    pt denies    Cataract    Closed left ankle fracture    COLONIC POLYPS, HX OF 2007  GERD    GLAUCOMA    Glaucoma    GOITER, MULTINODULAR    on [place] 2006, unchanged 6/13 with nodules all <13m   HIATAL HERNIA WITH REFLUX    Hip dysplasia, acquired    [facility]   OSTEOARTHRITIS    Osteoporosis    Serrated polyp of colon    TOBACCO USE, QUIT     Past Surgical History:  Procedure Laterality Date   BREAST SURGERY  1960   Left breast cyst removed no complications   CARPAL TUNNEL RELEASE Right    COLONOSCOPY  2005   EYE  SURGERY  1991-1992   both eyes   EYE SURGERY Left 2014   shunt behind left eye   ORIF FIBULA FRACTURE Left 08/24/2019   Procedure: OPEN REDUCTION INTERNAL FIXATION (ORIF) LEFT DISTAL FIBULA FRACTURE;  Surgeon: LMarchia Bond MD;  [place]  Service: [place]opedics;  Laterality: Left;   Right shoulder aurgery  10/2008    Social History   Socioeconomic History   Marital status: Widowed    Spouse name: Not on file   Number of children: 2   Years of education: Not on file   Highest education level: Not on file  Occupational History   Not on file  Tobacco Use   Smoking status: Former    Types: Cigarettes    Quit date: 08/12/1988    Years since quitting: 34.0   Smokeless tobacco: Never  Vaping Use   Vaping Use: Never used  Substance and Sexual Activity   Alcohol use: Yes    Alcohol/week: 0.0 standard drinks of alcohol    Comment: occassionally   Drug use: No   Sexual activity: Not Currently    Birth control/protection: Post-menopausal  Other Topics Concern   Not on file  Social History Narrative   Married,widowed; [place] in a [place]   Social Determinants of Health   Financial Resource Strain: Low Risk  (12/03/2021)   Overall Financial Resource Strain (CARDIA)    Difficulty of Paying Living Expenses: Not hard at all  Food Insecurity: No Food Insecurity (12/03/2021)   Hunger Vital Sign    Worried About Running Out of Food in the Last Year: Never true    Ran Out of Food in the Last Year: Never true  Transportation Needs: No Transportation Needs (12/03/2021)   PRAPARE - THydrologist(Medical): No    Lack of Transportation (Non-Medical): No  Physical Activity: Sufficiently Active (12/03/2021)   Exercise Vital Sign    Days of Exercise per Week: 5 days    Minutes of Exercise per Session: 30 min  Stress: No Stress Concern Present (12/03/2021)   [place]   Feeling of Stress : Not at all  Social Connections: Moderately Integrated (12/03/2021)   Social Connection and Isolation Panel [NHANES]    Frequency of Communication with Friends and Family: More than three times a week    Frequency of Social Gatherings with Friends and Family: More than three times a week    Attends Religious Services: More than 4 times per year    Active Member of [employer] Organizations: Yes    Attends CArchivistMeetings: More than 4 times per year    Marital Status: Widowed  Intimate Partner Violence: Not At Risk (12/03/2021)   Humiliation, Afraid, Rape, and Kick questionnaire  Fear of Current or Ex-Partner: No    Emotionally Abused: No    Physically Abused: No    Sexually Abused: No    Family History  Problem Relation Age of Onset   Diabetes Mother    Heart disease Mother    Arthritis Mother    Hypertension Mother    Arthritis Father    Colon cancer Paternal Grandmother    Esophageal cancer Neg Hx    Rectal cancer Neg Hx    Stomach cancer Neg Hx      Review of Systems  Constitutional: Negative.  Negative for chills and fever.  HENT:  Positive for congestion.   Respiratory:  Positive for cough.   Gastrointestinal:  Negative for abdominal pain, diarrhea, nausea and vomiting.  Skin: Negative.  Negative for rash.  Neurological:  Negative for dizziness and headaches.  All other systems reviewed and are negative.   Today's Vitals   08/08/22 0909  BP: (!) 150/80  Pulse: 67  Temp: 97.7 F (36.5 C)  TempSrc: Oral  SpO2: 95%  Weight: 136 lb 12.8 oz (62.1 kg)  Height: '5\' 2"'$  (1.575 m)   Body mass index is 25.02 kg/m.  Physical Exam Vitals reviewed.  Constitutional:      Appearance: Normal appearance.  HENT:     Right Ear: Tympanic membrane, ear canal and external ear normal.     Left Ear: Tympanic membrane, ear canal and external ear normal.     Mouth/Throat:     Mouth: Mucous membranes are moist.     Pharynx:  Oropharynx is clear.  Eyes:     Extraocular Movements: Extraocular movements intact.     Pupils: Pupils are equal, round, and reactive to light.  Cardiovascular:     Rate and Rhythm: Normal rate and regular rhythm.     Pulses: Normal pulses.     Heart sounds: Normal heart sounds.  Pulmonary:     Effort: Pulmonary effort is normal.     Breath sounds: Normal breath sounds.  Musculoskeletal:     Cervical back: No tenderness.  Lymphadenopathy:     Cervical: No cervical adenopathy.  Skin:    General: Skin is warm and dry.  Neurological:     General: No focal deficit present.     Mental Status: She is alert and oriented to person, place, and time.  Psychiatric:        Mood and Affect: Mood normal.        Behavior: Behavior normal.      ASSESSMENT & PLAN: A total of 32 minutes was spent with the patient and counseling/coordination of care regarding preparing for this visit, review of most recent office visit notes, review of chronic medical conditions under management, review of all medications, diagnosis of lower respiratory infection and need to start antibiotics, cough management, prognosis, documentation, and need for follow-up if no better or worse during the next several days.  Problem List Items Addressed This Visit       Respiratory   Lower respiratory infection - Primary    Viral respiratory infection now developing secondary bacterial infection. Will benefit from daily azithromycin for 5 days. No red flag signs or symptoms.  No complications. No signs of pneumonia.      Relevant Medications   azithromycin (ZITHROMAX) 250 MG tablet     Other   Acute cough    Cough management discussed. Start over-the-counter Mucinex DM and cough drops. Advised to stay well-hydrated May benefit from Tessalon 200 mg 3 times  daily as needed Advised to contact the office if no better or worse during the next several days.      Relevant Medications   benzonatate (TESSALON) 200 MG  capsule   Patient Instructions  Cough, Adult A cough helps to clear your throat and lungs. A cough may be a sign of an illness or another medical condition. An acute cough may only last 2-3 weeks, while a chronic cough may last 8 or more weeks. Many things can cause a cough. They include: Germs (viruses or bacteria) that attack the airway. Breathing in things that bother (irritate) your lungs. Allergies. Asthma. Mucus that runs down the back of your throat (postnasal drip). Smoking. Acid backing up from the stomach into the tube that moves food from the mouth to the stomach (gastroesophageal reflux). Some medicines. Lung problems. Other medical conditions, such as heart failure or a blood clot in the lung (pulmonary embolism). Follow these instructions at home: Medicines Take over-the-counter and prescription medicines only as told by your doctor. Talk with your doctor before you take medicines that stop a cough (cough suppressants). Lifestyle  Do not smoke, and try not to be around smoke. Do not use any products that contain nicotine or tobacco, such as cigarettes, e-cigarettes, and chewing tobacco. If you need help quitting, ask your doctor. Drink enough fluid to keep your pee (urine) pale yellow. Avoid caffeine. Do not drink alcohol if your doctor tells you not to drink. General instructions  Watch for any changes in your cough. Tell your doctor about them. Always cover your mouth when you cough. Stay away from things that make you cough, such as perfume, candles, campfire smoke, or cleaning products. If the air is dry, use a cool mist vaporizer or humidifier in your home. If your cough is worse at night, try using extra pillows to raise your head up higher while you sleep. Rest as needed. Keep all follow-up visits as told by your doctor. This is important. Contact a doctor if: You have new symptoms. You cough up pus. Your cough does not get better after 2-3 weeks, or your  cough gets worse. Cough medicine does not help your cough and you are not sleeping well. You have pain that gets worse or pain that is not helped with medicine. You have a fever. You are losing weight and you do not know why. You have night sweats. Get help right away if: You cough up blood. You have trouble breathing. Your heartbeat is very fast. These symptoms may be an emergency. Do not wait to see if the symptoms will go away. Get medical help right away. Call your local emergency services (911 in the [place] Do not drive yourself to the hospital. Summary A cough helps to clear your throat and lungs. Many things can cause a cough. Take over-the-counter and prescription medicines only as told by your doctor. Always cover your mouth when you cough. Contact a doctor if you have new symptoms or you have a cough that does not get better or gets worse. This information is not intended to replace advice given to you by your health care provider. Make sure you discuss any questions you have with your health care provider. Document Revised: 09/17/2019 Document Reviewed: 08/17/2018 Elsevier Patient Education  [place], MD [facility]

## 2022-08-08 NOTE — Assessment & Plan Note (Signed)
Viral respiratory infection now developing secondary bacterial infection. Will benefit from daily azithromycin for 5 days. No red flag signs or symptoms.  No complications. No signs of pneumonia.

## 2022-08-14 ENCOUNTER — Telehealth: Payer: Self-pay

## 2022-08-14 NOTE — Telephone Encounter (Signed)
Error

## 2022-08-16 ENCOUNTER — Other Ambulatory Visit: Payer: Self-pay | Admitting: Internal Medicine

## 2022-08-31 DIAGNOSIS — H401134 Primary open-angle glaucoma, bilateral, indeterminate stage: Secondary | ICD-10-CM | POA: Diagnosis not present

## 2022-09-09 DIAGNOSIS — L820 Inflamed seborrheic keratosis: Secondary | ICD-10-CM | POA: Diagnosis not present

## 2022-09-09 DIAGNOSIS — L920 Granuloma annulare: Secondary | ICD-10-CM | POA: Diagnosis not present

## 2022-09-09 DIAGNOSIS — D692 Other nonthrombocytopenic purpura: Secondary | ICD-10-CM | POA: Diagnosis not present

## 2022-10-04 ENCOUNTER — Encounter: Payer: Self-pay | Admitting: Internal Medicine

## 2022-10-04 ENCOUNTER — Ambulatory Visit (INDEPENDENT_AMBULATORY_CARE_PROVIDER_SITE_OTHER): Payer: Medicare HMO | Admitting: Internal Medicine

## 2022-10-04 VITALS — BP 138/64 | HR 61 | Temp 97.5°F | Ht 62.0 in | Wt 138.0 lb

## 2022-10-04 DIAGNOSIS — M545 Low back pain, unspecified: Secondary | ICD-10-CM | POA: Diagnosis not present

## 2022-10-04 DIAGNOSIS — M159 Polyosteoarthritis, unspecified: Secondary | ICD-10-CM

## 2022-10-04 DIAGNOSIS — G8929 Other chronic pain: Secondary | ICD-10-CM

## 2022-10-04 NOTE — Progress Notes (Signed)
   Subjective:   Patient ID: Linda [name], female    DOB: February 01, 1941, 82 y.o.   MRN: [id number]  Back Pain Pertinent negatives include no abdominal pain or chest pain.   The patient is an 82 YO female coming in for back pain. Chronic and worsening in last 6 months.  Review of Systems  Constitutional: Negative.   HENT: Negative.    Eyes: Negative.   Respiratory:  Negative for cough, chest tightness and shortness of breath.   Cardiovascular:  Negative for chest pain, palpitations and leg swelling.  Gastrointestinal:  Negative for abdominal distention, abdominal pain, constipation, diarrhea, nausea and vomiting.  Musculoskeletal:  Positive for back pain.  Skin: Negative.   Neurological: Negative.   Psychiatric/Behavioral: Negative.      Objective:  Physical Exam Constitutional:      Appearance: She is well-developed.  HENT:     Head: Normocephalic and atraumatic.  Cardiovascular:     Rate and Rhythm: Normal rate and regular rhythm.  Pulmonary:     Effort: Pulmonary effort is normal. No respiratory distress.     Breath sounds: Normal breath sounds. No wheezing or rales.  Abdominal:     General: Bowel sounds are normal. There is no distension.     Palpations: Abdomen is soft.     Tenderness: There is no abdominal tenderness. There is no rebound.  Musculoskeletal:        General: Tenderness present.     Cervical back: Normal range of motion.  Skin:    General: Skin is warm and dry.  Neurological:     Mental Status: She is alert and oriented to person, place, and time.     Coordination: Coordination normal.     Vitals:   10/04/22 1041  BP: 138/64  Pulse: 61  Temp: (!) 97.5 F (36.4 C)  TempSrc: Oral  SpO2: 96%  Weight: 138 lb (62.6 kg)  Height: 5' 2"$  (1.575 m)    Assessment & Plan:

## 2022-10-04 NOTE — Assessment & Plan Note (Signed)
Reviewed MRI from 2018 with her. Likely progressive arthritis. Referral to sports medicine for epidural injections. Referral to PT as well for core training.

## 2022-10-04 NOTE — Patient Instructions (Signed)
We will get you in physical therapy and with sports medicine for the shots.

## 2022-10-04 NOTE — Assessment & Plan Note (Signed)
Overall worsening and back pain is interfering with function at this time. Referral to PT and sports medicine for epidural injection.

## 2022-10-08 ENCOUNTER — Ambulatory Visit (INDEPENDENT_AMBULATORY_CARE_PROVIDER_SITE_OTHER): Payer: Medicare HMO

## 2022-10-08 ENCOUNTER — Encounter: Payer: Self-pay | Admitting: Family Medicine

## 2022-10-08 ENCOUNTER — Ambulatory Visit: Payer: Medicare HMO | Admitting: Family Medicine

## 2022-10-08 VITALS — BP 137/74 | HR 67 | Ht 62.0 in | Wt 142.0 lb

## 2022-10-08 DIAGNOSIS — M545 Low back pain, unspecified: Secondary | ICD-10-CM

## 2022-10-08 DIAGNOSIS — M47816 Spondylosis without myelopathy or radiculopathy, lumbar region: Secondary | ICD-10-CM | POA: Diagnosis not present

## 2022-10-08 DIAGNOSIS — M48061 Spinal stenosis, lumbar region without neurogenic claudication: Secondary | ICD-10-CM | POA: Diagnosis not present

## 2022-10-08 DIAGNOSIS — G8929 Other chronic pain: Secondary | ICD-10-CM

## 2022-10-08 NOTE — Progress Notes (Signed)
I, Linda [name], CMA acting as a Education administrator for Linda Leader, MD.  Subjective:    CC: Low back pain  HPI: Patient is an 82 year old female presenting with low back pain chronic in nature worsening over the last 6 months.  Patient locates pain to bilateral lower back. Per pt has dx of scoliosis and arthritis. Has concerns about slumping over. Ambulated with a cane when walking longer distances.   Radiating pain: occasionally into the groin. LE numbness/tingling: no LE weakness: yes, thinks d/t back pain Aggravates: WB, ambulation, standing after sitting for long period of time.  Treatments tried: Tylenol Arthritis at bedtime. Short-term relief with heat.   Prior to 2018 perhaps 20 years ago she had a series of injections with Dr. Nelva Bush at [facility].  She cannot recall exactly what kind of injections they were.  She notes that they were sort of helpful.   Dx imaging: 2218 L-spine MRI  Pertinent review of Systems: No fevers or chills  Relevant historical information: Osteopenia   Objective:    Vitals:   10/08/22 1449  BP: 137/74  Pulse: 67  SpO2: 97%   General: Well Developed, well nourished, and in no acute distress.   MSK: L-spine: Normal appearing. Nontender to palpation spinal midline. Tender palpation bilateral lumbar paraspinal musculature. Decreased lumbar motion pain with extension. Lower extremity strength is intact.  Lab and Radiology Results  X-ray images lumbar spine obtained today personally and independently interpreted No acute compression fractures are visible. Degenerative scoliosis convex left concave right. Multilevel DDD. Significant facet arthritis at L3-4 L4-5 and L5-S1 x-ray. Await formal radiology review  EXAM: MRI LUMBAR SPINE WITHOUT CONTRAST   TECHNIQUE: Multiplanar, multisequence MR imaging of the lumbar spine was performed. No intravenous contrast was administered.   COMPARISON:  MRI lumbar spine 12/17/2013 and plain  films lumbar spine 03/30/2015 from [facility]. Plain films lumbar spine performed at [facility] 08/15/2016.   FINDINGS: Segmentation:  Standard.   Alignment: Severe convex left scoliosis with the apex at L3 appears unchanged since the most recent examination. Trace retrolisthesis L1 on L2 and L2 on L3 is identified. There is trace anterolisthesis L3 on L4 and L5 on S1.   Vertebrae: Mild, remote T10 superior endplate compression fracture is unchanged since the 2016 plain films. No acute fracture is identified. Marked degenerative endplate signal change at L3-4 has worsened since the prior MRI. There is also some new degenerative endplate signal change posteriorly at L1-2.   Conus medullaris: Extends to the of L1 level and appears normal.   Paraspinal and other soft tissues: Negative.   Disc levels:   T9-10, T10-11 and T11-12 are imaged in the sagittal plane only. Shallow disc bulge at T9-10 without central canal or foraminal narrowing is identified. There is an unchanged broad-based central and left paracentral protrusion at T11-12 which appears to slightly deform the ventral aspect of the distal cord. The foramina are open. Minimal disc bulge T11-12 without central canal stenosis noted.   T12-L1:  Negative.   L1-2: Bilateral facet degenerative disease is worse on the right. There is a shallow disc bulge eccentrically prominent to the left. Mild central canal and left foraminal narrowing is seen. The right foramen is open.   L2-3: Right worse than left facet degenerative disease is identified. Shallow disc bulge and endplate spur eccentric to the right. Narrowing in the right subarticular recess is predominantly due to facet arthropathy. The left foramen is open. Mild to moderate right foraminal  narrowing is identified. The appearance of this level is unchanged.   L3-4: Moderately severe bilateral facet degenerative disease  is identified. Marrow edema in the right L3 and L4 pedicles is consistent with stress change related to facet arthropathy. There is a disc bulge and ligamentum flavum thickening. Moderate to moderately severe central canal and bilateral subarticular recess stenosis is present. Marked right foraminal narrowing appears worse than on the prior exam. The left foramen is open.   L4-5: Moderately severe bilateral facet degenerative change is present. There is a shallow disc bulge endplate spur are. There is some narrowing in the left subarticular recess mainly due to facet degenerative disease. Moderate central canal stenosis is present overall. The foramina are open.   L5-S1: There is a shallow disc bulge. Bilateral facet arthropathy is worse on the left and contributes to moderate left foraminal stenosis. The central canal and right foramen are open.   IMPRESSION: Progression of spondylosis at L3-4 where there is moderate to moderately severe central canal and bilateral subarticular recess stenosis. Severe right foraminal narrowing is also seen at this level.   No notable change in narrowing of the right subarticular recess and foramen at L2-3 due to facet arthropathy and disc.   No change in narrowing of the left subarticular recess at L4-5 mainly due to facet arthropathy. There is moderate central canal stenosis overall at L4-5. No nerve root compression is identified.   Moderate left foraminal stenosis at L5-S1 appears slightly worse on the prior examination.   Severe convex left scoliosis with the apex at L3.     Electronically Signed   By: Inge Rise M.D.   On: 09/02/2016 15:05  Impression and Recommendations:    Assessment and Plan: 82 y.o. female with chronic low back pain.  This is an ongoing issue for what sounds like 20 years.  She has had different forms of treatment off and on for years including really some sort of steroid injections at [facility]  more than 5 years ago. Her symptoms have worsened recently.  She saw her primary care provider who arranged for physical therapy to start at [facility] neuro rehab.  I think this is a great idea and if not sufficient she would do well with aquatic physical therapy.  If not 6 weeks of conservative management with physical therapy is not sufficient would recommend repeat lumbar spine MRI for facet injection or epidural steroid injection planning.  Looking at her MRI from 2018 she has multiple levels and areas that could be targets of injection and it is not clear where her pain generators are.  We talked about the challenging and fraught nature of injections for axial back pain.  She agrees with the current plan.  PDMP not reviewed this encounter. Orders Placed This Encounter  Procedures   DG Lumbar Spine 2-3 Views    Standing Status:   Future    Number of Occurrences:   1    Standing Expiration Date:   11/06/2022    Order Specific Question:   Reason for Exam (SYMPTOM  OR DIAGNOSIS REQUIRED)    Answer:   Low back pain    Order Specific Question:   Preferred imaging location?    Answer:   [facility]   No orders of the defined types were placed in this encounter.   Discussed warning signs or symptoms. Please see discharge instructions. Patient expresses understanding.   The above documentation has been reviewed and is accurate and complete Linda [name],  M.D.  

## 2022-10-08 NOTE — Patient Instructions (Addendum)
Thank you for coming in today.   Please get an Xray today before you leave   Continue the PT DR Sharlet Salina set you up for.   Recheck with me in 6 weeks.   We can get a new MRI at that time if you are not much better.   That can help plan for these shots.   We can even do aquatic PT.

## 2022-10-09 DIAGNOSIS — M48061 Spinal stenosis, lumbar region without neurogenic claudication: Secondary | ICD-10-CM | POA: Insufficient documentation

## 2022-10-09 DIAGNOSIS — M47816 Spondylosis without myelopathy or radiculopathy, lumbar region: Secondary | ICD-10-CM | POA: Insufficient documentation

## 2022-10-10 NOTE — Progress Notes (Signed)
Lumbar spine x-ray shows mild arthritis changes.

## 2022-10-21 ENCOUNTER — Encounter: Payer: Self-pay | Admitting: Physical Therapy

## 2022-10-21 ENCOUNTER — Ambulatory Visit: Payer: Medicare HMO | Attending: Internal Medicine | Admitting: Physical Therapy

## 2022-10-21 ENCOUNTER — Other Ambulatory Visit: Payer: Self-pay

## 2022-10-21 DIAGNOSIS — M545 Low back pain, unspecified: Secondary | ICD-10-CM | POA: Diagnosis not present

## 2022-10-21 DIAGNOSIS — G8929 Other chronic pain: Secondary | ICD-10-CM | POA: Insufficient documentation

## 2022-10-21 DIAGNOSIS — M6281 Muscle weakness (generalized): Secondary | ICD-10-CM | POA: Insufficient documentation

## 2022-10-21 DIAGNOSIS — R293 Abnormal posture: Secondary | ICD-10-CM | POA: Insufficient documentation

## 2022-10-21 NOTE — Patient Instructions (Signed)
RE-ALIGNMENT ROUTINE EXERCISES-OSTEOPROROSIS BASIC FOR POSTURAL CORRECTION   RE-ALIGNMENT Tips BENEFITS: 1.It helps to re-align the curves of the back and improve standing posture. 2.It allows the back muscles to rest and strengthen in preparation for more activity. FREQUENCY: Daily, even after weeks, months and years of more advanced exercises. START: 1.All exercises start in the same position: lying on the back, arms resting on the supporting surface, palms up and slightly away from the body, backs of hands down, knees bent, feet flat. 2.The head, neck, arms, and legs are supported according to specific instructions of your therapist. Copyright  VHI. All rights reserved.    1. Decompression Exercise: Basic.   Takes compression off the vertebral bodies; increases tolerance for lying on the back; helps relieve back pain   Lie on back on firm surface, knees bent, feet flat, arms turned up, out to sides (~35 degrees). Head neck and arms supported as necessary. Time _5-15__ minutes. Surface: floor     2. Shoulder Press  Strengthens upper back extensors and scapular retractors.   Press both shoulders down. Hold _2-3__ seconds. Repeat _3-5__ times. Surface: floor        3. Head Press With Chin Tuck  Strengthens neck extensors   Tuck chin SLIGHTLY toward chest, keep mouth closed. Feel weight on back of head. Increase weight by pressing head down. Hold _2-3__ seconds. Relax. Repeat 3-5___ times. Surface: floor     4. Leg Lengthener: stretches quadratus lumborum and hip flexors.  Strengthens quads and ankle dorsiflexors.  Leg Lengthener: Full    Straighten one leg. Pull toes AND forefoot toward knee, extend heel. Lengthen leg by pulling pelvis away from ribs. Hold __5_ seconds. Relax. Repeat 1 time. Re-bend knee. Do other leg. Each leg __5_ times. Surface: floor    Leg Lengthener / Leg Press Combo: Single Leg    Straighten one leg down to floor. Pull toes AND forefoot  toward knee; extend heel. Lengthen leg by pulling pelvis away from ribs. Press leg down. DO NOT BEND KNEE. Hold _5__ seconds. Relax leg. Repeat exercise 1 time. Relax leg. Re-bend knee. Repeat with other leg. Do 5 times    

## 2022-10-21 NOTE — Therapy (Signed)
OUTPATIENT PHYSICAL THERAPY THORACOLUMBAR EVALUATION   Patient Name: Linda [name] MRN: [id number] DOB:Jul 07, 1941, 82 y.o., female Today's Date: 10/21/2022  END OF SESSION:  PT End of Session - 10/21/22 1227     Visit Number 1    Date for PT Re-Evaluation 12/16/22    Authorization Type [employer] - requesting 1x/week x 8 weeks    Progress Note Due on Visit 10    PT Start Time 1100    PT Stop Time 1147    PT Time Calculation (min) 47 min    Activity Tolerance Patient tolerated treatment well    Behavior During Therapy WFL for tasks assessed/performed             Past Medical History:  Diagnosis Date   ALLERGIC RHINITIS    Allergy    ANEMIA-IRON DEFICIENCY    Anxiety state, unspecified    Carpal tunnel syndrome    pt denies    Cataract    Closed left ankle fracture    COLONIC POLYPS, HX OF 2007   GERD    GLAUCOMA    Glaucoma    GOITER, MULTINODULAR    on [place] 2006, unchanged 6/13 with nodules all <58m   HIATAL HERNIA WITH REFLUX    Hip dysplasia, acquired    [facility]   OSTEOARTHRITIS    Osteoporosis    Serrated polyp of colon    TOBACCO USE, QUIT    Past Surgical History:  Procedure Laterality Date   BREAST SURGERY  1960   Left breast cyst removed no complications   CARPAL TUNNEL RELEASE Right    COLONOSCOPY  2005   EYE SURGERY  1991-1992   both eyes   EYE SURGERY Left 2014   shunt behind left eye   ORIF FIBULA FRACTURE Left 08/24/2019   Procedure: OPEN REDUCTION INTERNAL FIXATION (ORIF) LEFT DISTAL FIBULA FRACTURE;  Surgeon: LMarchia Bond MD;  [place]  Service: Orthopedics;  Laterality: Left;   Right shoulder aurgery  10/2008   Patient Active Problem List   Diagnosis Date Noted   Spinal stenosis of lumbar region 10/09/2022   Facet arthritis of lumbar region 10/09/2022   Chronic bilateral low back pain without sciatica 10/04/2022   Acute cough 08/08/2022   Lower respiratory  infection 08/08/2022   Osteopenia 11/26/2021   Pre-diabetes 11/26/2021   Other fatigue 07/28/2020   Dupuytren's disease of palm 01/19/2018   Bilateral sensorineural hearing loss 03/12/2016   Osteoarthritis of both hips resulting from hip dysplasia 01/25/2015   Primary osteoarthritis of right hip 01/25/2015   Anemia 11/06/2012   Anxiety 11/06/2012   Depression 11/06/2012   Cataract, nuclear 10/28/2012   Primary open angle glaucoma of both eyes, indeterminate stage 10/28/2012   Ptosis of eyelid 10/28/2012   Bladder prolapse, female, acquired 01/09/2012   GERD (gastroesophageal reflux disease) 05/21/2007   Osteoarthritis 05/21/2007    PCP: CHoyt Koch MD  REFERRING PROVIDER: CHoyt Koch MD  REFERRING DIAG: M54.50,G89.29 (ICD-10-CM) - Chronic bilateral low back pain without sciatic  Rationale for Evaluation and Treatment: Rehabilitation  THERAPY DIAG:  Chronic bilateral low back pain without sciatica  Muscle weakness (generalized)  Abnormal posture  ONSET DATE: 20+ years ago but recently worsened  SUBJECTIVE:  SUBJECTIVE STATEMENT: I have had pain for years which worsened over the last few months.  I was very depressed since Christmas.  I do 2x/week at [employer].  I used to walk about a mile or 30 min.  I'd like to get back to walking.    PERTINENT HISTORY:  Osteopenia, scoliosis, Hx of Lt ankle fracture with hardware  PAIN:  PAIN:  Are you having pain? Yes NPRS scale: 4-9/10 Pain location: midline LBP into bil buttocks Pain orientation: Bilateral, Medial, and Lower  PAIN TYPE: aching, dull, and tight Pain description: constant  Aggravating factors: prolonged standing and walking up to 1 hour Relieving factors: sitting provides immediate relief  but hips get tight   PRECAUTIONS: Other: osteopenia  WEIGHT BEARING RESTRICTIONS: No  FALLS:  Has patient fallen in last 6 months? No  LIVING ENVIRONMENT: Lives with: lives alone Lives in: House/apartment Stairs: Yes: Internal: 16 steps; can reach both Has following equipment at home: Single point cane for longer distance community outings  OCCUPATION: retired  PLOF: Independent  PATIENT GOALS: improve upright posture, strengthen core, return to walking for exercise 2-3x/week  NEXT MD VISIT: about 6 weeks, April, after PT  OBJECTIVE:   DIAGNOSTIC FINDINGS:  XRAY: Degenerative scoliosis convex left concave right. Multilevel DDD. Significant facet arthritis at L3-4 L4-5 and L5-S1  MRI 2018: IMPRESSION: Progression of spondylosis at L3-4 where there is moderate to moderately severe central canal and bilateral subarticular recess stenosis. Severe right foraminal narrowing is also seen at this level.   No notable change in narrowing of the right subarticular recess and foramen at L2-3 due to facet arthropathy and disc.   No change in narrowing of the left subarticular recess at L4-5 mainly due to facet arthropathy. There is moderate central canal stenosis overall at L4-5. No nerve root compression is identified.   Moderate left foraminal stenosis at L5-S1 appears slightly worse on the prior examination.   Severe convex left scoliosis with the apex at L3.  PATIENT SURVEYS:  Modified Oswestry 16%, moderate disability category   SCREENING FOR RED FLAGS: Bowel or bladder incontinence: No Spinal tumors: No Cauda equina syndrome: No Compression fracture: No Abdominal aneurysm: No  COGNITION: Overall cognitive status: Within functional limits for tasks assessed     SENSATION: WFL - has some "dead places" in Lt foot following fracture/hardware placement  MUSCLE LENGTH: Hamstring WNL bil Limited gluteals and piriformis bil with end range tightness Limited hip  flexors bil 50%  POSTURE:  scoliosis present  PALPATION: Tender along bil lumbar spine and adjacent muscles, tender gluteals and piriformis bil  LUMBAR ROM:   AROM eval  Flexion Fingers to ankles, limited lumbar segmental reversal  Extension NT  Right lateral flexion 3, pain  Left lateral flexion 10  Right rotation 75%  Left rotation 50%, pain   (Blank rows = not tested)  LOWER EXTREMITY ROM:     WFL bil  LOWER EXTREMITY MMT:   Bil hips 3+/5 Bil knees 4/5  FUNCTIONAL TESTS:  Unable to balance in SLS on either LE, able to perform half squat, cannot march in place secondary to balance  GAIT: Distance walked: within clinic Assistive device utilized: None Level of assistance: Complete Independence, uses SPC for longer distances Comments: short stride length bil  TODAY'S TREATMENT:  DATE: 10/21/22  Spinal decompression series Discussed activity pacing and benefit of spinal decompression around lunchtime and late afternoon Beginning walking program at park with benches ([place] as option) HEP initiated see below  PATIENT EDUCATION:  Education details: Access Code: [email or web address] Person educated: Patient Education method: Consulting civil engineer, Demonstration, Verbal cues, and Handouts Education comprehension: verbalized understanding and returned demonstration  HOME EXERCISE PROGRAM: Access Code: [email or web address], spinal decompression series URL: [email or web address] Date: 10/21/2022 Prepared by: Venetia Night Miraya Cudney  Exercises - Supine Posterior Pelvic Tilt  - 1 x daily - 7 x weekly - 1 sets - 10 reps - 5-10 sec hold - Supine Lower Trunk Rotation  - 1 x daily - 7 x weekly - 1 sets - 10 reps - Supine Double Knee to Chest  - 1 x daily - 7 x weekly - 1 sets - 3 reps - 20-30 sec hold - Supine Piriformis Stretch with Foot on Ground  - 1 x daily - 7 x  weekly - 1 sets - 3 reps - 20-30 sec hold  ASSESSMENT:  CLINICAL IMPRESSION: Patient is an 82 y.o. female who was seen today for physical therapy evaluation and treatment for chronic LBP which has worsened recently.  Pt has scoliosis and signif DDD and stenosis throughout lumbar spine.  She uses a [facility] for longer distance community ambulation or uses cart to lean on when shopping.  She lives alone.  She is limited in standing and walking activities to 1 hour.  She gets immediate relief when she can sit down.  She does group water aerobics 2x/week.  She has been depressed and inactive and expressed goals of improved posture, core strength and get back to walking 2-3 days for exercise.   She has limited trunk ROM secondary to scoliosis and arthritis.  She has core and LE weakness 3+/5 to 4/5 throughout.  PT initiated spinal decompression series, activity pacing, return to walking ideas (park with benches to sit intermittently and start with short distance), and HEP program in supine.  Pt will benefit from skilled PT to work toward her goals.   OBJECTIVE IMPAIRMENTS: Abnormal gait, decreased activity tolerance, decreased balance, decreased mobility, decreased ROM, hypomobility, impaired flexibility, improper body mechanics, postural dysfunction, and pain.   ACTIVITY LIMITATIONS: carrying, lifting, bending, standing, squatting, stairs, and locomotion level  PARTICIPATION LIMITATIONS: meal prep, cleaning, laundry, shopping, and community activity  PERSONAL FACTORS: Age, Time since onset of injury/illness/exacerbation, and 1 comorbidity: scoliosis, osteopenia  are also affecting patient's functional outcome.   REHAB POTENTIAL: Excellent  CLINICAL DECISION MAKING: Stable/uncomplicated  EVALUATION COMPLEXITY: Low   GOALS: Goals reviewed with patient? Yes  SHORT TERM GOALS: Target date: 11/18/22  Pt will be ind with initial HEP and spinal decompression 1-2x/day. Baseline: Goal status: INITIAL  2.   Pt will return to walking at park with seated breaks (benches) 1-2x/week for 10+ min. Baseline:  Goal status: INITIAL    LONG TERM GOALS: Target date: 12/16/22  Pt will be ind with advanced HEP for postural strength, LE functional strength, core strength and flexibility to improve overall activity tolerance. Baseline:  Goal status: INITIAL  2.  Pt will be able to perform standing and walking activity for up to 1 hour with pain not to exceed 4/10. Baseline:  Goal status: INITIAL  3.  Pt will be able to walk at park for up to 20 min with seated breaks as needed (benches) 2 days a week. Baseline:  Goal status: INITIAL  4.  Pt will improve LE strength  to at least 4+/5 for improved gait, stairs and squatting for housework. Baseline:  Goal status: INITIAL  5.  Pt will improve ODI score by at least 4 points to reduce score into mild disability category from mod disability. Baseline: 8/50, 16% Goal status: INITIAL   PLAN:  PT FREQUENCY: 1x/week  PT DURATION: 8 weeks  PLANNED INTERVENTIONS: Therapeutic exercises, Therapeutic activity, Neuromuscular re-education, Balance training, Gait training, Patient/Family education, Self Care, Joint mobilization, Aquatic Therapy, Dry Needling, Electrical stimulation, Spinal mobilization, Cryotherapy, Moist heat, Taping, and Manual therapy.  PLAN FOR NEXT SESSION: do 3' walk test for baseline, NuStep, review spinal decompression and HEP, f/u on walking at park, progress supine and/or standing tbands for UE postural strength, core stabilization   Kelon Easom, PT 10/21/22 12:28 PM

## 2022-10-31 ENCOUNTER — Ambulatory Visit: Payer: Medicare HMO

## 2022-10-31 DIAGNOSIS — M6281 Muscle weakness (generalized): Secondary | ICD-10-CM | POA: Diagnosis not present

## 2022-10-31 DIAGNOSIS — G8929 Other chronic pain: Secondary | ICD-10-CM

## 2022-10-31 DIAGNOSIS — M545 Low back pain, unspecified: Secondary | ICD-10-CM | POA: Diagnosis not present

## 2022-10-31 DIAGNOSIS — R293 Abnormal posture: Secondary | ICD-10-CM

## 2022-10-31 DIAGNOSIS — R262 Difficulty in walking, not elsewhere classified: Secondary | ICD-10-CM

## 2022-10-31 DIAGNOSIS — R252 Cramp and spasm: Secondary | ICD-10-CM

## 2022-10-31 NOTE — Therapy (Signed)
OUTPATIENT PHYSICAL THERAPY THORACOLUMBAR EVALUATION   Patient Name: Linda [name] MRN: [id number] DOB:February 03, 1941, 82 y.o., female Today's Date: 10/31/2022  END OF SESSION:  PT End of Session - 10/31/22 1234     Visit Number 2    Date for PT Re-Evaluation 12/16/22    Progress Note Due on Visit 10    PT Start Time 1234    PT Stop Time 1315    PT Time Calculation (min) 41 min    Activity Tolerance Patient tolerated treatment well    Behavior During Therapy WFL for tasks assessed/performed             Past Medical History:  Diagnosis Date   ALLERGIC RHINITIS    Allergy    ANEMIA-IRON DEFICIENCY    Anxiety state, unspecified    Carpal tunnel syndrome    pt denies    Cataract    Closed left ankle fracture    COLONIC POLYPS, HX OF 2007   GERD    GLAUCOMA    Glaucoma    GOITER, MULTINODULAR    on [place] 2006, unchanged 6/13 with nodules all <62mm   HIATAL HERNIA WITH REFLUX    Hip dysplasia, acquired    B sx; [facility] 01/[facility] -ongoing PT   OSTEOARTHRITIS    Osteoporosis    Serrated polyp of colon    TOBACCO USE, QUIT    Past Surgical History:  Procedure Laterality Date   BREAST SURGERY  1960   Left breast cyst removed no complications   CARPAL TUNNEL RELEASE Right    COLONOSCOPY  2005   EYE SURGERY  1991-1992   both eyes   EYE SURGERY Left 2014   shunt behind left eye   ORIF FIBULA FRACTURE Left 08/24/2019   Procedure: OPEN REDUCTION INTERNAL FIXATION (ORIF) LEFT DISTAL FIBULA FRACTURE;  Surgeon: Marchia Bond, MD;  Location: [place];  Service: Orthopedics;  Laterality: Left;   Right shoulder aurgery  10/2008   Patient Active Problem List   Diagnosis Date Noted   Spinal stenosis of lumbar region 10/09/2022   Facet arthritis of lumbar region 10/09/2022   Chronic bilateral low back pain without sciatica 10/04/2022   Acute cough 08/08/2022   Lower respiratory infection 08/08/2022   Osteopenia 11/26/2021   Pre-diabetes 11/26/2021    Other fatigue 07/28/2020   Dupuytren's disease of palm 01/19/2018   Bilateral sensorineural hearing loss 03/12/2016   Osteoarthritis of both hips resulting from hip dysplasia 01/25/2015   Primary osteoarthritis of right hip 01/25/2015   Anemia 11/06/2012   Anxiety 11/06/2012   Depression 11/06/2012   Cataract, nuclear 10/28/2012   Primary open angle glaucoma of both eyes, indeterminate stage 10/28/2012   Ptosis of eyelid 10/28/2012   Bladder prolapse, female, acquired 01/09/2012   GERD (gastroesophageal reflux disease) 05/21/2007   Osteoarthritis 05/21/2007    PCP: Hoyt Koch, MD  REFERRING PROVIDER: Hoyt Koch, MD  REFERRING DIAG: M54.50,G89.29 (ICD-10-CM) - Chronic bilateral low back pain without sciatic  Rationale for Evaluation and Treatment: Rehabilitation  THERAPY DIAG:  Chronic bilateral low back pain without sciatica  Abnormal posture  Muscle weakness (generalized)  Difficulty in walking, not elsewhere classified  Cramp and spasm  ONSET DATE: 20+ years ago but recently worsened  SUBJECTIVE:  SUBJECTIVE STATEMENT: I did water exercise today but it was a rough class.  I am pretty sore.    PERTINENT HISTORY:  Osteopenia, scoliosis, Hx of Lt ankle fracture with hardware  PAIN:  PAIN:  Are you having pain? Yes NPRS scale: 4-9/10 Pain location: midline LBP into bil buttocks Pain orientation: Bilateral, Medial, and Lower  PAIN TYPE: aching, dull, and tight Pain description: constant  Aggravating factors: prolonged standing and walking up to 1 hour Relieving factors: sitting provides immediate relief but hips get tight   PRECAUTIONS: Other: osteopenia  WEIGHT BEARING RESTRICTIONS: No  FALLS:  Has patient fallen in last 6 months? No  LIVING  ENVIRONMENT: Lives with: lives alone Lives in: House/apartment Stairs: Yes: Internal: 16 steps; can reach both Has following equipment at home: Single point cane for longer distance community outings  OCCUPATION: retired  PLOF: Independent  PATIENT GOALS: improve upright posture, strengthen core, return to walking for exercise 2-3x/week  NEXT MD VISIT: about 6 weeks, April, after PT  OBJECTIVE:   DIAGNOSTIC FINDINGS:  XRAY: Degenerative scoliosis convex left concave right. Multilevel DDD. Significant facet arthritis at L3-4 L4-5 and L5-S1  MRI 2018: IMPRESSION: Progression of spondylosis at L3-4 where there is moderate to moderately severe central canal and bilateral subarticular recess stenosis. Severe right foraminal narrowing is also seen at this level.   No notable change in narrowing of the right subarticular recess and foramen at L2-3 due to facet arthropathy and disc.   No change in narrowing of the left subarticular recess at L4-5 mainly due to facet arthropathy. There is moderate central canal stenosis overall at L4-5. No nerve root compression is identified.   Moderate left foraminal stenosis at L5-S1 appears slightly worse on the prior examination.   Severe convex left scoliosis with the apex at L3.  PATIENT SURVEYS:  Modified Oswestry 16%, moderate disability category   SCREENING FOR RED FLAGS: Bowel or bladder incontinence: No Spinal tumors: No Cauda equina syndrome: No Compression fracture: No Abdominal aneurysm: No  COGNITION: Overall cognitive status: Within functional limits for tasks assessed     SENSATION: WFL - has some "dead places" in Lt foot following fracture/hardware placement  MUSCLE LENGTH: Hamstring WNL bil Limited gluteals and piriformis bil with end range tightness Limited hip flexors bil 50%  POSTURE:  scoliosis present  PALPATION: Tender along bil lumbar spine and adjacent muscles, tender gluteals and piriformis  bil  LUMBAR ROM:   AROM eval  Flexion Fingers to ankles, limited lumbar segmental reversal  Extension NT  Right lateral flexion 3, pain  Left lateral flexion 10  Right rotation 75%  Left rotation 50%, pain   (Blank rows = not tested)  LOWER EXTREMITY ROM:     WFL bil  LOWER EXTREMITY MMT:   Bil hips 3+/5 Bil knees 4/5  FUNCTIONAL TESTS:  Unable to balance in SLS on either LE, able to perform half squat, cannot march in place secondary to balance 10/31/22: 3 min walk test: 516.6 feet with back pain 4/10  GAIT: Distance walked: within clinic Assistive device utilized: None Level of assistance: Complete Independence, uses SPC for longer distances Comments: short stride length bil  TODAY'S TREATMENT:  DATE: 10/31/22  3 min walk test completed : see above Nustep x 5 min level 2 Standing hamstring stretch 3 x 30 sec each LE Standing quad/hip flexor stretch 3 x 30 sec Supine PPT x 20 Supine PPT with 90/90 heel tap  DATE: 10/21/22  Spinal decompression series Discussed activity pacing and benefit of spinal decompression around lunchtime and late afternoon Beginning walking program at park with benches ([place] as option) HEP initiated see below  PATIENT EDUCATION:  Education details: Access Code: [email or web address] Person educated: Patient Education method: Consulting civil engineer, Media planner, Verbal cues, and Handouts Education comprehension: verbalized understanding and returned demonstration  HOME EXERCISE PROGRAM: Access Code: [email or web address] URL: [email or web address] Date: 10/31/2022 Prepared by: Candyce Churn  Exercises - Supine Posterior Pelvic Tilt  - 1 x daily - 7 x weekly - 1 sets - 10 reps - 5-10 sec hold - Supine 90/90 Alternating Heel Touches with Posterior Pelvic Tilt  - 1 x daily - 7 x weekly - 1 sets - 20 reps - Supine Lower  Trunk Rotation  - 1 x daily - 7 x weekly - 1 sets - 10 reps - Supine Double Knee to Chest  - 1 x daily - 7 x weekly - 1 sets - 3 reps - 20-30 sec hold - Supine Piriformis Stretch with Foot on Ground  - 1 x daily - 7 x weekly - 1 sets - 3 reps - 20-30 sec hold - Standing Hamstring Stretch on Chair  - 1 x daily - 7 x weekly - 1 sets - 3 reps - 30 sec hold - Quadricep Stretch with Chair and Counter Support  - 1 x daily - 7 x weekly - 1 sets - 3 reps - 30 sec hold  ASSESSMENT:  CLINICAL IMPRESSION: [name] was able to complete all tasks today with minimal discomfort.  We had to add PPT when she was doing quad/hip flexor stretching due to lumbar discomfort.  This resolved the discomfort.  She had some difficulty with fatigue and needed rest breaks on 90/90 heel taps to complete these with proper technique.  She would benefit from continued skilled PT for postural and core strengthening and flexibility exercises.    OBJECTIVE IMPAIRMENTS: Abnormal gait, decreased activity tolerance, decreased balance, decreased mobility, decreased ROM, hypomobility, impaired flexibility, improper body mechanics, postural dysfunction, and pain.   ACTIVITY LIMITATIONS: carrying, lifting, bending, standing, squatting, stairs, and locomotion level  PARTICIPATION LIMITATIONS: meal prep, cleaning, laundry, shopping, and community activity  PERSONAL FACTORS: Age, Time since onset of injury/illness/exacerbation, and 1 comorbidity: scoliosis, osteopenia  are also affecting patient's functional outcome.   REHAB POTENTIAL: Excellent  CLINICAL DECISION MAKING: Stable/uncomplicated  EVALUATION COMPLEXITY: Low   GOALS: Goals reviewed with patient? Yes  SHORT TERM GOALS: Target date: 11/18/22  Pt will be ind with initial HEP and spinal decompression 1-2x/day. Baseline: Goal status: INITIAL  2.  Pt will return to walking at park with seated breaks (benches) 1-2x/week for 10+ min. Baseline:  Goal status:  INITIAL    LONG TERM GOALS: Target date: 12/16/22  Pt will be ind with advanced HEP for postural strength, LE functional strength, core strength and flexibility to improve overall activity tolerance. Baseline:  Goal status: INITIAL  2.  Pt will be able to perform standing and walking activity for up to 1 hour with pain not to exceed 4/10. Baseline:  Goal status: INITIAL  3.  Pt will be able to walk at park for up to 20 min with seated breaks as needed (benches)  2 days a week. Baseline:  Goal status: INITIAL  4.  Pt will improve LE strength to at least 4+/5 for improved gait, stairs and squatting for housework. Baseline:  Goal status: INITIAL  5.  Pt will improve ODI score by at least 4 points to reduce score into mild disability category from mod disability. Baseline: 8/50, 16% Goal status: INITIAL   PLAN:  PT FREQUENCY: 1x/week  PT DURATION: 8 weeks  PLANNED INTERVENTIONS: Therapeutic exercises, Therapeutic activity, Neuromuscular re-education, Balance training, Gait training, Patient/Family education, Self Care, Joint mobilization, Aquatic Therapy, Dry Needling, Electrical stimulation, Spinal mobilization, Cryotherapy, Moist heat, Taping, and Manual therapy.  PLAN FOR NEXT SESSION:  NuStep,  progress supine and/or standing tbands for UE postural strength, core stabilization   Najai Waszak B. Kristi Hyer, PT 10/31/22 1:23 PM [facility] [address], [place], [place] [address] Phone # [phone] Fax [phone]

## 2022-11-01 ENCOUNTER — Telehealth: Payer: Self-pay | Admitting: Internal Medicine

## 2022-11-01 NOTE — Telephone Encounter (Signed)
Patient dropped off document Handicap Placard, to be filled out by provider. Patient requested to send it via Call Patient to pick up within 7-days. Document is located in providers tray at front office.Please advise at [place]

## 2022-11-01 NOTE — Telephone Encounter (Signed)
Placed inside Dr crawfords office box 

## 2022-11-05 ENCOUNTER — Telehealth: Payer: Self-pay

## 2022-11-05 NOTE — Telephone Encounter (Signed)
Called patient to let her know that her form is ready for pick-up

## 2022-11-06 ENCOUNTER — Ambulatory Visit: Payer: Medicare HMO

## 2022-11-06 DIAGNOSIS — G8929 Other chronic pain: Secondary | ICD-10-CM | POA: Diagnosis not present

## 2022-11-06 DIAGNOSIS — R252 Cramp and spasm: Secondary | ICD-10-CM

## 2022-11-06 DIAGNOSIS — M6281 Muscle weakness (generalized): Secondary | ICD-10-CM | POA: Diagnosis not present

## 2022-11-06 DIAGNOSIS — R293 Abnormal posture: Secondary | ICD-10-CM

## 2022-11-06 DIAGNOSIS — M545 Low back pain, unspecified: Secondary | ICD-10-CM | POA: Diagnosis not present

## 2022-11-06 DIAGNOSIS — R262 Difficulty in walking, not elsewhere classified: Secondary | ICD-10-CM

## 2022-11-06 NOTE — Therapy (Signed)
OUTPATIENT PHYSICAL THERAPY THORACOLUMBAR EVALUATION   Patient Name: Linda [name] MRN: [id number] DOB:03-19-1941, 82 y.o., female Today's Date: 11/06/2022  END OF SESSION:  PT End of Session - 11/06/22 1112     Visit Number 3    Date for PT Re-Evaluation 12/16/22    Authorization Type [employer] - requesting 1x/week x 8 weeks    PT Start Time 1105    PT Stop Time 1145    PT Time Calculation (min) 40 min    Activity Tolerance Patient tolerated treatment well    Behavior During Therapy WFL for tasks assessed/performed             Past Medical History:  Diagnosis Date   ALLERGIC RHINITIS    Allergy    ANEMIA-IRON DEFICIENCY    Anxiety state, unspecified    Carpal tunnel syndrome    pt denies    Cataract    Closed left ankle fracture    COLONIC POLYPS, HX OF 2007   GERD    GLAUCOMA    Glaucoma    GOITER, MULTINODULAR    on [place] 2006, unchanged 6/13 with nodules all <52mm   HIATAL HERNIA WITH REFLUX    Hip dysplasia, acquired    B sx; eval at [facility] for same 01/2015 -ongoing PT   OSTEOARTHRITIS    Osteoporosis    Serrated polyp of colon    TOBACCO USE, QUIT    Past Surgical History:  Procedure Laterality Date   BREAST SURGERY  1960   Left breast cyst removed no complications   CARPAL TUNNEL RELEASE Right    COLONOSCOPY  2005   EYE SURGERY  1991-1992   both eyes   EYE SURGERY Left 2014   shunt behind left eye   ORIF FIBULA FRACTURE Left 08/24/2019   Procedure: OPEN REDUCTION INTERNAL FIXATION (ORIF) LEFT DISTAL FIBULA FRACTURE;  Surgeon: Marchia Bond, MD;  Location: [place] [facility];  Service: Orthopedics;  Laterality: Left;   Right shoulder aurgery  10/2008   Patient Active Problem List   Diagnosis Date Noted   Spinal stenosis of lumbar region 10/09/2022   Facet arthritis of lumbar region 10/09/2022   Chronic bilateral low back pain without sciatica 10/04/2022   Acute cough 08/08/2022   Lower respiratory infection 08/08/2022   Osteopenia  11/26/2021   Pre-diabetes 11/26/2021   Other fatigue 07/28/2020   Dupuytren's disease of palm 01/19/2018   Bilateral sensorineural hearing loss 03/12/2016   Osteoarthritis of both hips resulting from hip dysplasia 01/25/2015   Primary osteoarthritis of right hip 01/25/2015   Anemia 11/06/2012   Anxiety 11/06/2012   Depression 11/06/2012   Cataract, nuclear 10/28/2012   Primary open angle glaucoma of both eyes, indeterminate stage 10/28/2012   Ptosis of eyelid 10/28/2012   Bladder prolapse, female, acquired 01/09/2012   GERD (gastroesophageal reflux disease) 05/21/2007   Osteoarthritis 05/21/2007    PCP: Hoyt Koch, MD  REFERRING PROVIDER: Hoyt Koch, MD  REFERRING DIAG: M54.50,G89.29 (ICD-10-CM) - Chronic bilateral low back pain without sciatic  Rationale for Evaluation and Treatment: Rehabilitation  THERAPY DIAG:  Chronic bilateral low back pain without sciatica  Abnormal posture  Muscle weakness (generalized)  Difficulty in walking, not elsewhere classified  Cramp and spasm  ONSET DATE: 20+ years ago but recently worsened  SUBJECTIVE:  SUBJECTIVE STATEMENT: Patient reports she is having a pretty good day.  Last water class went really well.     PERTINENT HISTORY:  Osteopenia, scoliosis, Hx of Lt ankle fracture with hardware  PAIN:  PAIN:  Are you having pain? Yes NPRS scale: 4-9/10 Pain location: midline LBP into bil buttocks Pain orientation: Bilateral, Medial, and Lower  PAIN TYPE: aching, dull, and tight Pain description: constant  Aggravating factors: prolonged standing and walking up to 1 hour Relieving factors: sitting provides immediate relief but hips get tight   PRECAUTIONS: Other: osteopenia  WEIGHT BEARING RESTRICTIONS: No  FALLS:  Has  patient fallen in last 6 months? No  LIVING ENVIRONMENT: Lives with: lives alone Lives in: House/apartment Stairs: Yes: Internal: 16 steps; can reach both Has following equipment at home: Single point cane for longer distance community outings  OCCUPATION: retired  PLOF: Independent  PATIENT GOALS: improve upright posture, strengthen core, return to walking for exercise 2-3x/week  NEXT MD VISIT: about 6 weeks, April, after PT  OBJECTIVE:   DIAGNOSTIC FINDINGS:  XRAY: Degenerative scoliosis convex left concave right. Multilevel DDD. Significant facet arthritis at L3-4 L4-5 and L5-S1  MRI 2018: IMPRESSION: Progression of spondylosis at L3-4 where there is moderate to moderately severe central canal and bilateral subarticular recess stenosis. Severe right foraminal narrowing is also seen at this level.   No notable change in narrowing of the right subarticular recess and foramen at L2-3 due to facet arthropathy and disc.   No change in narrowing of the left subarticular recess at L4-5 mainly due to facet arthropathy. There is moderate central canal stenosis overall at L4-5. No nerve root compression is identified.   Moderate left foraminal stenosis at L5-S1 appears slightly worse on the prior examination.   Severe convex left scoliosis with the apex at L3.  PATIENT SURVEYS:  Modified Oswestry 16%, moderate disability category   SCREENING FOR RED FLAGS: Bowel or bladder incontinence: No Spinal tumors: No Cauda equina syndrome: No Compression fracture: No Abdominal aneurysm: No  COGNITION: Overall cognitive status: Within functional limits for tasks assessed     SENSATION: WFL - has some "dead places" in Lt foot following fracture/hardware placement  MUSCLE LENGTH: Hamstring WNL bil Limited gluteals and piriformis bil with end range tightness Limited hip flexors bil 50%  POSTURE:  scoliosis present  PALPATION: Tender along bil lumbar spine and adjacent  muscles, tender gluteals and piriformis bil  LUMBAR ROM:   AROM eval  Flexion Fingers to ankles, limited lumbar segmental reversal  Extension NT  Right lateral flexion 3, pain  Left lateral flexion 10  Right rotation 75%  Left rotation 50%, pain   (Blank rows = not tested)  LOWER EXTREMITY ROM:     WFL bil  LOWER EXTREMITY MMT:   Bil hips 3+/5 Bil knees 4/5  FUNCTIONAL TESTS:  Unable to balance in SLS on either LE, able to perform half squat, cannot march in place secondary to balance 10/31/22: 3 min walk test: 516.6 feet with back pain 4/10  GAIT: Distance walked: within clinic Assistive device utilized: None Level of assistance: Complete Independence, uses SPC for longer distances Comments: short stride length bil  TODAY'S TREATMENT:  DATE: 10/31/22  Nustep x 5 min level 2 Introduced foam roller for gluteals and hip x 1 min Seated piriformis stretch 3 x 30 sec Seated hip flexor stretch off edge of corner of mat table with one leg behind and other leg fwd (instructed to maintain pelvic tilt with this position Trigger Point Dry-Needling  Treatment instructions: Expect mild to moderate muscle soreness. S/S of pneumothorax if dry needled over a lung field, and to seek immediate medical attention should they occur. Patient verbalized understanding of these instructions and education.  Patient Consent Given: Yes Education handout provided: Yes Muscles treated: lumbar multifidi Electrical stimulation performed: No Parameters: N/A Treatment response/outcome: Skilled palpation used to identify taut bands and trigger points.  Once identified, dry needling techniques used to treat these areas.  Patient reported the deep ache that is desired for effective needling during treatment. Following treatment, patient stood and felt minimal soreness.   Supine PPT x  20 Supine PPT with 90/90 heel tap  DATE: 10/31/22  3 min walk test completed : see above Nustep x 5 min level 2 Standing hamstring stretch 3 x 30 sec each LE Standing quad/hip flexor stretch 3 x 30 sec Supine PPT x 20 Supine PPT with 90/90 heel tap  DATE: 10/21/22  Spinal decompression series Discussed activity pacing and benefit of spinal decompression around lunchtime and late afternoon Beginning walking program at park with benches ([place] as option) HEP initiated see below  PATIENT EDUCATION:  Education details: Access Code: [email or web address] Person educated: Patient Education method: Consulting civil engineer, Media planner, Verbal cues, and Handouts Education comprehension: verbalized understanding and returned demonstration  HOME EXERCISE PROGRAM: Access Code: [email or web address] URL: [email or web address] Date: 10/31/2022 Prepared by: Candyce Churn  Exercises - Supine Posterior Pelvic Tilt  - 1 x daily - 7 x weekly - 1 sets - 10 reps - 5-10 sec hold - Supine 90/90 Alternating Heel Touches with Posterior Pelvic Tilt  - 1 x daily - 7 x weekly - 1 sets - 20 reps - Supine Lower Trunk Rotation  - 1 x daily - 7 x weekly - 1 sets - 10 reps - Supine Double Knee to Chest  - 1 x daily - 7 x weekly - 1 sets - 3 reps - 20-30 sec hold - Supine Piriformis Stretch with Foot on Ground  - 1 x daily - 7 x weekly - 1 sets - 3 reps - 20-30 sec hold - Standing Hamstring Stretch on Chair  - 1 x daily - 7 x weekly - 1 sets - 3 reps - 30 sec hold - Quadricep Stretch with Chair and Counter Support  - 1 x daily - 7 x weekly - 1 sets - 3 reps - 30 sec hold  ASSESSMENT:  CLINICAL IMPRESSION: [name] is making slow but steady progress.  She had a lot of taut tissue during dry needling but no twitch responses.  She did report the aching during needling.  The tissue was difficult to penetrate indicating some scar tissue formation.  She does well with core exercises and should be able to progress with this.    She would benefit from continued skilled PT for postural and core strengthening and flexibility exercises.    OBJECTIVE IMPAIRMENTS: Abnormal gait, decreased activity tolerance, decreased balance, decreased mobility, decreased ROM, hypomobility, impaired flexibility, improper body mechanics, postural dysfunction, and pain.   ACTIVITY LIMITATIONS: carrying, lifting, bending, standing, squatting, stairs, and locomotion level  PARTICIPATION LIMITATIONS: meal prep, cleaning, laundry, shopping, and community activity  PERSONAL FACTORS: Age,  Time since onset of injury/illness/exacerbation, and 1 comorbidity: scoliosis, osteopenia  are also affecting patient's functional outcome.   REHAB POTENTIAL: Excellent  CLINICAL DECISION MAKING: Stable/uncomplicated  EVALUATION COMPLEXITY: Low   GOALS: Goals reviewed with patient? Yes  SHORT TERM GOALS: Target date: 11/18/22  Pt will be ind with initial HEP and spinal decompression 1-2x/day. Baseline: Goal status: INITIAL  2.  Pt will return to walking at park with seated breaks (benches) 1-2x/week for 10+ min. Baseline:  Goal status: INITIAL    LONG TERM GOALS: Target date: 12/16/22  Pt will be ind with advanced HEP for postural strength, LE functional strength, core strength and flexibility to improve overall activity tolerance. Baseline:  Goal status: INITIAL  2.  Pt will be able to perform standing and walking activity for up to 1 hour with pain not to exceed 4/10. Baseline:  Goal status: INITIAL  3.  Pt will be able to walk at park for up to 20 min with seated breaks as needed (benches) 2 days a week. Baseline:  Goal status: INITIAL  4.  Pt will improve LE strength to at least 4+/5 for improved gait, stairs and squatting for housework. Baseline:  Goal status: INITIAL  5.  Pt will improve ODI score by at least 4 points to reduce score into mild disability category from mod disability. Baseline: 8/50, 16% Goal status:  INITIAL   PLAN:  PT FREQUENCY: 1x/week  PT DURATION: 8 weeks  PLANNED INTERVENTIONS: Therapeutic exercises, Therapeutic activity, Neuromuscular re-education, Balance training, Gait training, Patient/Family education, Self Care, Joint mobilization, Aquatic Therapy, Dry Needling, Electrical stimulation, Spinal mobilization, Cryotherapy, Moist heat, Taping, and Manual therapy.  PLAN FOR NEXT SESSION:  NuStep,  progress supine and/or standing tbands for UE postural strength, core stabilization, DN to glutes and piriformis  Casmir Auguste B. Bobbyjo Marulanda, PT 11/06/22 12:42 PM  [facility] [place] [address] [place], [place] [address] Phone # [phone] Fax [phone]

## 2022-11-07 ENCOUNTER — Other Ambulatory Visit: Payer: Self-pay | Admitting: Internal Medicine

## 2022-11-13 ENCOUNTER — Encounter: Payer: Self-pay | Admitting: Physical Therapy

## 2022-11-13 ENCOUNTER — Ambulatory Visit: Payer: Medicare HMO | Attending: Internal Medicine | Admitting: Physical Therapy

## 2022-11-13 DIAGNOSIS — M6281 Muscle weakness (generalized): Secondary | ICD-10-CM | POA: Diagnosis not present

## 2022-11-13 DIAGNOSIS — R252 Cramp and spasm: Secondary | ICD-10-CM | POA: Diagnosis not present

## 2022-11-13 DIAGNOSIS — M545 Low back pain, unspecified: Secondary | ICD-10-CM | POA: Diagnosis not present

## 2022-11-13 DIAGNOSIS — R293 Abnormal posture: Secondary | ICD-10-CM | POA: Insufficient documentation

## 2022-11-13 DIAGNOSIS — G8929 Other chronic pain: Secondary | ICD-10-CM | POA: Diagnosis not present

## 2022-11-13 DIAGNOSIS — R262 Difficulty in walking, not elsewhere classified: Secondary | ICD-10-CM | POA: Diagnosis not present

## 2022-11-13 NOTE — Therapy (Signed)
OUTPATIENT PHYSICAL THERAPY THORACOLUMBAR EVALUATION   Patient Name: Linda [name] MRN: [id number] DOB:1940/12/07, 82 y.o., female Today's Date: 11/13/2022  END OF SESSION:  PT End of Session - 11/13/22 1015     Visit Number 4    Date for PT Re-Evaluation 12/16/22    Authorization Type [employer] approved 8 visits 10/21/2022-5/6/202    Authorization - Visit Number 3    Authorization - Number of Visits 8    Progress Note Due on Visit 10    PT Start Time 1016    PT Stop Time 1100    PT Time Calculation (min) 44 min    Activity Tolerance Patient tolerated treatment well    Behavior During Therapy WFL for tasks assessed/performed              Past Medical History:  Diagnosis Date   ALLERGIC RHINITIS    Allergy    ANEMIA-IRON DEFICIENCY    Anxiety state, unspecified    Carpal tunnel syndrome    pt denies    Cataract    Closed left ankle fracture    COLONIC POLYPS, HX OF 2007   GERD    GLAUCOMA    Glaucoma    GOITER, MULTINODULAR    on [place] 2006, unchanged 6/13 with nodules all <58mm   HIATAL HERNIA WITH REFLUX    Hip dysplasia, acquired    B sx; eval at [facility] for same 01/2015 -ongoing PT   OSTEOARTHRITIS    Osteoporosis    Serrated polyp of colon    TOBACCO USE, QUIT    Past Surgical History:  Procedure Laterality Date   BREAST SURGERY  1960   Left breast cyst removed no complications   CARPAL TUNNEL RELEASE Right    COLONOSCOPY  2005   EYE SURGERY  1991-1992   both eyes   EYE SURGERY Left 2014   shunt behind left eye   ORIF FIBULA FRACTURE Left 08/24/2019   Procedure: OPEN REDUCTION INTERNAL FIXATION (ORIF) LEFT DISTAL FIBULA FRACTURE;  Surgeon: Marchia Bond, MD;  Location: [facility];  Service: Orthopedics;  Laterality: Left;   Right shoulder aurgery  10/2008   Patient Active Problem List   Diagnosis Date Noted   Spinal stenosis of lumbar region 10/09/2022   Facet arthritis of lumbar region 10/09/2022   Chronic bilateral low back pain without  sciatica 10/04/2022   Acute cough 08/08/2022   Lower respiratory infection 08/08/2022   Osteopenia 11/26/2021   Pre-diabetes 11/26/2021   Other fatigue 07/28/2020   Dupuytren's disease of palm 01/19/2018   Bilateral sensorineural hearing loss 03/12/2016   Osteoarthritis of both hips resulting from hip dysplasia 01/25/2015   Primary osteoarthritis of right hip 01/25/2015   Anemia 11/06/2012   Anxiety 11/06/2012   Depression 11/06/2012   Cataract, nuclear 10/28/2012   Primary open angle glaucoma of both eyes, indeterminate stage 10/28/2012   Ptosis of eyelid 10/28/2012   Bladder prolapse, female, acquired 01/09/2012   GERD (gastroesophageal reflux disease) 05/21/2007   Osteoarthritis 05/21/2007    PCP: Hoyt Koch, MD  REFERRING PROVIDER: Hoyt Koch, MD  REFERRING DIAG: M54.50,G89.29 (ICD-10-CM) - Chronic bilateral low back pain without sciatic  Rationale for Evaluation and Treatment: Rehabilitation  THERAPY DIAG:  Chronic bilateral low back pain without sciatica  Abnormal posture  Muscle weakness (generalized)  Difficulty in walking, not elsewhere classified  Cramp and spasm  ONSET DATE: 20+ years ago but recently worsened  SUBJECTIVE:  SUBJECTIVE STATEMENT: I had an epiphany.  I switched up my Tylenol arthritis to take in in AM instead of PM and I am feeling so much better.      PERTINENT HISTORY:  Osteopenia, scoliosis, Hx of Lt ankle fracture with hardware  PAIN:  PAIN:  Are you having pain? Yes NPRS scale: 4-9/10 Pain location: midline LBP into bil buttocks Pain orientation: Bilateral, Medial, and Lower  PAIN TYPE: aching, dull, and tight Pain description: constant  Aggravating factors: prolonged standing and walking up to 1 hour Relieving factors:  sitting provides immediate relief but hips get tight   PRECAUTIONS: Other: osteopenia  WEIGHT BEARING RESTRICTIONS: No  FALLS:  Has patient fallen in last 6 months? No  LIVING ENVIRONMENT: Lives with: lives alone Lives in: House/apartment Stairs: Yes: Internal: 16 steps; can reach both Has following equipment at home: Single point cane for longer distance community outings  OCCUPATION: retired  PLOF: Independent  PATIENT GOALS: improve upright posture, strengthen core, return to walking for exercise 2-3x/week  NEXT MD VISIT: about 6 weeks, April, after PT  OBJECTIVE:   DIAGNOSTIC FINDINGS:  XRAY: Degenerative scoliosis convex left concave right. Multilevel DDD. Significant facet arthritis at L3-4 L4-5 and L5-S1  MRI 2018: IMPRESSION: Progression of spondylosis at L3-4 where there is moderate to moderately severe central canal and bilateral subarticular recess stenosis. Severe right foraminal narrowing is also seen at this level.   No notable change in narrowing of the right subarticular recess and foramen at L2-3 due to facet arthropathy and disc.   No change in narrowing of the left subarticular recess at L4-5 mainly due to facet arthropathy. There is moderate central canal stenosis overall at L4-5. No nerve root compression is identified.   Moderate left foraminal stenosis at L5-S1 appears slightly worse on the prior examination.   Severe convex left scoliosis with the apex at L3.  PATIENT SURVEYS:  Modified Oswestry 16%, moderate disability category   SCREENING FOR RED FLAGS: Bowel or bladder incontinence: No Spinal tumors: No Cauda equina syndrome: No Compression fracture: No Abdominal aneurysm: No  COGNITION: Overall cognitive status: Within functional limits for tasks assessed     SENSATION: WFL - has some "dead places" in Lt foot following fracture/hardware placement  MUSCLE LENGTH: Hamstring WNL bil Limited gluteals and piriformis bil with  end range tightness Limited hip flexors bil 50%  POSTURE:  scoliosis present  PALPATION: Tender along bil lumbar spine and adjacent muscles, tender gluteals and piriformis bil  LUMBAR ROM:   AROM eval  Flexion Fingers to ankles, limited lumbar segmental reversal  Extension NT  Right lateral flexion 3, pain  Left lateral flexion 10  Right rotation 75%  Left rotation 50%, pain   (Blank rows = not tested)  LOWER EXTREMITY ROM:     WFL bil  LOWER EXTREMITY MMT:   Bil hips 3+/5 Bil knees 4/5  FUNCTIONAL TESTS:  Unable to balance in SLS on either LE, able to perform half squat, cannot march in place secondary to balance 10/31/22: 3 min walk test: 516.6 feet with back pain 4/10  GAIT: Distance walked: within clinic Assistive device utilized: None Level of assistance: Complete Independence, uses SPC for longer distances Comments: short stride length bil  TODAY'S TREATMENT:  DATE: 11/13/22  Recumbent bike L3/L2 x 5' (Pt doesn't like NuStep), PT present to discuss status Discussion about [employer] membership Review of spinal decompression series and reason for doing so, timing should be afternoon to gain more evening hours with less pain Review of HEP supine aspects: PPT, 90/90 heel taps (changed to SLR), TA indraw, lower trunk rotation Trigger Point Dry-Needling  Treatment instructions: Expect mild to moderate muscle soreness. S/S of pneumothorax if dry needled over a lung field, and to seek immediate medical attention should they occur. Patient verbalized understanding of these instructions and education.  Patient Consent Given: Yes Education handout provided: Previously provided Muscles treated: bil lumbar multifidi Electrical stimulation performed: No Parameters: N/A Treatment response/outcome: release and elongation along spine bil   DATE: 10/31/22   Nustep x 5 min level 2 Introduced foam roller for gluteals and hip x 1 min Seated piriformis stretch 3 x 30 sec Seated hip flexor stretch off edge of corner of mat table with one leg behind and other leg fwd (instructed to maintain pelvic tilt with this position Trigger Point Dry-Needling  Treatment instructions: Expect mild to moderate muscle soreness. S/S of pneumothorax if dry needled over a lung field, and to seek immediate medical attention should they occur. Patient verbalized understanding of these instructions and education.  Patient Consent Given: Yes Education handout provided: Yes Muscles treated: lumbar multifidi Electrical stimulation performed: No Parameters: N/A Treatment response/outcome: Skilled palpation used to identify taut bands and trigger points.  Once identified, dry needling techniques used to treat these areas.  Patient reported the deep ache that is desired for effective needling during treatment. Following treatment, patient stood and felt minimal soreness.   Supine PPT x 20 Supine PPT with 90/90 heel tap  DATE: 10/31/22  3 min walk test completed : see above Nustep x 5 min level 2 Standing hamstring stretch 3 x 30 sec each LE Standing quad/hip flexor stretch 3 x 30 sec Supine PPT x 20 Supine PPT with 90/90 heel tap   PATIENT EDUCATION:  Education details: Access Code: [email or web address] Person educated: Patient Education method: Explanation, Demonstration, Verbal cues, and Handouts Education comprehension: verbalized understanding and returned demonstration  HOME EXERCISE PROGRAM: Access Code: [email or web address] URL: [email or web address] Date: 11/13/2022 Prepared by: Venetia Night Eulogia Dismore  Exercises - Supine Posterior Pelvic Tilt  - 1 x daily - 7 x weekly - 1 sets - 10 reps - 5-10 sec hold - Supine Lower Trunk Rotation  - 1 x daily - 7 x weekly - 1 sets - 10 reps - Supine Double Knee to Chest  - 1 x daily - 7 x weekly - 1 sets - 3 reps - 20-30 sec hold -  Supine Piriformis Stretch with Foot on Ground  - 1 x daily - 7 x weekly - 1 sets - 3 reps - 20-30 sec hold - Standing Hamstring Stretch on Chair  - 1 x daily - 7 x weekly - 1 sets - 3 reps - 30 sec hold - Quadricep Stretch with Chair and Counter Support  - 1 x daily - 7 x weekly - 1 sets - 3 reps - 30 sec hold - Supine Straight Leg Raise  - 1 x daily - 7 x weekly - 1 sets - 10 reps  ASSESSMENT:  CLINICAL IMPRESSION: Pt started taking tylenol arthritis in AM and is feeling much better.  She is interested in joining [employer] after PT D/C to use pool and option for indoor gym.  Pt needed review of HEP and  spinal decompression today as she was confusing a few exercises.  Changed 90/90 heel taps to SLR due to difficulty maintaining stability with 90/90 position today.  Repeated DN to lumbar spine as Pt felt it has contributed to her recent relief.    OBJECTIVE IMPAIRMENTS: Abnormal gait, decreased activity tolerance, decreased balance, decreased mobility, decreased ROM, hypomobility, impaired flexibility, improper body mechanics, postural dysfunction, and pain.   ACTIVITY LIMITATIONS: carrying, lifting, bending, standing, squatting, stairs, and locomotion level  PARTICIPATION LIMITATIONS: meal prep, cleaning, laundry, shopping, and community activity  PERSONAL FACTORS: Age, Time since onset of injury/illness/exacerbation, and 1 comorbidity: scoliosis, osteopenia  are also affecting patient's functional outcome.   REHAB POTENTIAL: Excellent  CLINICAL DECISION MAKING: Stable/uncomplicated  EVALUATION COMPLEXITY: Low   GOALS: Goals reviewed with patient? Yes  SHORT TERM GOALS: Target date: 11/18/22  Pt will be ind with initial HEP and spinal decompression 1-2x/day. Baseline: Goal status: ongoing  2.  Pt will return to walking at park with seated breaks (benches) 1-2x/week for 10+ min. Baseline:  Goal status: INITIAL    LONG TERM GOALS: Target date: 12/16/22  Pt will be ind with  advanced HEP for postural strength, LE functional strength, core strength and flexibility to improve overall activity tolerance. Baseline:  Goal status: INITIAL  2.  Pt will be able to perform standing and walking activity for up to 1 hour with pain not to exceed 4/10. Baseline:  Goal status: INITIAL  3.  Pt will be able to walk at park for up to 20 min with seated breaks as needed (benches) 2 days a week. Baseline:  Goal status: INITIAL  4.  Pt will improve LE strength to at least 4+/5 for improved gait, stairs and squatting for housework. Baseline:  Goal status: INITIAL  5.  Pt will improve ODI score by at least 4 points to reduce score into mild disability category from mod disability. Baseline: 8/50, 16% Goal status: INITIAL   PLAN:  PT FREQUENCY: 1x/week  PT DURATION: 8 weeks  PLANNED INTERVENTIONS: Therapeutic exercises, Therapeutic activity, Neuromuscular re-education, Balance training, Gait training, Patient/Family education, Self Care, Joint mobilization, Aquatic Therapy, Dry Needling, Electrical stimulation, Spinal mobilization, Cryotherapy, Moist heat, Taping, and Manual therapy.  PLAN FOR NEXT SESSION:  bike (not Nustep), progress supine and/or standing tbands for UE postural strength, core stabilization, DN to glutes and piriformis  Baruch Merl, PT 11/13/22 11:07 AM  [facility] [address] [place], [place] [address] Phone # [phone] Fax [phone]

## 2022-11-17 ENCOUNTER — Other Ambulatory Visit: Payer: Self-pay | Admitting: Internal Medicine

## 2022-11-19 ENCOUNTER — Ambulatory Visit: Payer: Medicare HMO | Admitting: Family Medicine

## 2022-11-19 ENCOUNTER — Encounter: Payer: Self-pay | Admitting: Family Medicine

## 2022-11-19 VITALS — BP 126/64 | HR 74 | Ht 62.0 in | Wt 140.8 lb

## 2022-11-19 DIAGNOSIS — M545 Low back pain, unspecified: Secondary | ICD-10-CM | POA: Diagnosis not present

## 2022-11-19 DIAGNOSIS — G8929 Other chronic pain: Secondary | ICD-10-CM

## 2022-11-19 NOTE — Patient Instructions (Signed)
Thank you for coming in today.   Continue home exercise and PT.   Tylenol arthritis 2 pills every 8 hours is the max dose.

## 2022-11-19 NOTE — Progress Notes (Signed)
   I, Stevenson Clinch, CMA acting as a scribe for Clementeen Graham, MD.  Linda [name] is a 82 y.o. female who presents to [facility] today for 6-week follow-up chronic LBP.  Patient was last seen by Dr. Denyse Amass on 10/08/2022 and was advised to continue PT as prescribed by her PCP, completing 4 visits.  Today, patient reports slow improvement of lower back sx. PT is beneficial for sx, had dry-needling done. Denies new or worsening sx. Has started taking Tylenol Arthritis 2 tablets BID, this has been very helpful.   Dx imaging: 10/08/2022 L-spine x-ray 09/02/2016 L-spine MRI   Pertinent review of systems: No fevers or chills  Relevant historical information: Facet arthritis lumbar spine.   Exam:  BP 126/64   Pulse 74   Ht 5\' 2"  (1.575 m)   Wt 140 lb 12.8 oz (63.9 kg)   SpO2 97%   BMI 25.75 kg/m  General: Well Developed, well nourished, and in no acute distress.   MSK: L-spine nontender palpation spinal midline. Decreased lumbar motion.    Lab and Radiology Results  DG Lumbar Spine 2-3 Views  Result Date: 10/10/2022 CLINICAL DATA:  Low back pain x2 months EXAM: LUMBAR SPINE - 2-3 VIEW COMPARISON:  MRI lumbar spine dated 09/02/2016 FINDINGS: Five lumbar type vertebral bodies. Normal lumbar lordosis.  Moderate lumbar levoscoliosis. No evidence of fracture or dislocation. Vertebral body heights are maintained. Mild degenerative changes of the visualized thoracolumbar spine, most prominent at L3-4. Visualized bony pelvis appears intact. IMPRESSION: No fracture or dislocation is seen. Mild degenerative changes, as above. Electronically Signed   By: Charline Bills M.D.   On: 10/10/2022 17:05   I, Clementeen Graham, personally (independently) visualized and performed the interpretation of the images attached in this note.    Assessment and Plan: 82 y.o. female with low back pain.  Significantly improving with physical therapy and acetaminophen.  Plan to finish out  physical therapy and continue home exercise program. We talked about safe dosing of acetaminophen.  She is taking Tylenol arthritis.  Max dose would be 2 every 8 hours.  Recommend also heating pad. Recheck as needed.     Discussed warning signs or symptoms. Please see discharge instructions. Patient expresses understanding.   The above documentation has been reviewed and is accurate and complete Clementeen Graham, M.D.

## 2022-11-20 ENCOUNTER — Ambulatory Visit: Payer: Medicare HMO | Admitting: Physical Therapy

## 2022-11-20 ENCOUNTER — Encounter: Payer: Self-pay | Admitting: Physical Therapy

## 2022-11-20 DIAGNOSIS — M6281 Muscle weakness (generalized): Secondary | ICD-10-CM

## 2022-11-20 DIAGNOSIS — R252 Cramp and spasm: Secondary | ICD-10-CM

## 2022-11-20 DIAGNOSIS — R293 Abnormal posture: Secondary | ICD-10-CM

## 2022-11-20 DIAGNOSIS — G8929 Other chronic pain: Secondary | ICD-10-CM | POA: Diagnosis not present

## 2022-11-20 DIAGNOSIS — R262 Difficulty in walking, not elsewhere classified: Secondary | ICD-10-CM

## 2022-11-20 DIAGNOSIS — M545 Low back pain, unspecified: Secondary | ICD-10-CM | POA: Diagnosis not present

## 2022-11-20 NOTE — Therapy (Signed)
OUTPATIENT PHYSICAL THERAPY THORACOLUMBAR EVALUATION   Patient Name: Linda [name] MRN: [id number] DOB:04-Sep-1940, 82 y.o., female Today's Date: 11/20/2022  END OF SESSION:  PT End of Session - 11/20/22 1018     Visit Number 5    Date for PT Re-Evaluation 12/16/22    Authorization Type [employer] approved 8 visits 10/21/2022-5/6/202    Authorization - Visit Number 4    Authorization - Number of Visits 8    Progress Note Due on Visit 10    PT Start Time 1018    PT Stop Time 1100    PT Time Calculation (min) 42 min    Activity Tolerance Patient tolerated treatment well    Behavior During Therapy WFL for tasks assessed/performed               Past Medical History:  Diagnosis Date   ALLERGIC RHINITIS    Allergy    ANEMIA-IRON DEFICIENCY    Anxiety state, unspecified    Carpal tunnel syndrome    pt denies    Cataract    Closed left ankle fracture    COLONIC POLYPS, HX OF 2007   GERD    GLAUCOMA    Glaucoma    GOITER, MULTINODULAR    on [place] 2006, unchanged 6/13 with nodules all <19mm   HIATAL HERNIA WITH REFLUX    Hip dysplasia, acquired    B sx; eval at [facility] for same 01/2015 -ongoing PT   OSTEOARTHRITIS    Osteoporosis    Serrated polyp of colon    TOBACCO USE, QUIT    Past Surgical History:  Procedure Laterality Date   BREAST SURGERY  1960   Left breast cyst removed no complications   CARPAL TUNNEL RELEASE Right    COLONOSCOPY  2005   EYE SURGERY  1991-1992   both eyes   EYE SURGERY Left 2014   shunt behind left eye   ORIF FIBULA FRACTURE Left 08/24/2019   Procedure: OPEN REDUCTION INTERNAL FIXATION (ORIF) LEFT DISTAL FIBULA FRACTURE;  Surgeon: Teryl Lucy, MD;  Location: [facility];  Service: Orthopedics;  Laterality: Left;   Right shoulder aurgery  10/2008   Patient Active Problem List   Diagnosis Date Noted   Spinal stenosis of lumbar region 10/09/2022   Facet arthritis of lumbar region 10/09/2022   Chronic bilateral low back pain  without sciatica 10/04/2022   Acute cough 08/08/2022   Lower respiratory infection 08/08/2022   Osteopenia 11/26/2021   Pre-diabetes 11/26/2021   Other fatigue 07/28/2020   Dupuytren's disease of palm 01/19/2018   Bilateral sensorineural hearing loss 03/12/2016   Osteoarthritis of both hips resulting from hip dysplasia 01/25/2015   Primary osteoarthritis of right hip 01/25/2015   Anemia 11/06/2012   Anxiety 11/06/2012   Depression 11/06/2012   Cataract, nuclear 10/28/2012   Primary open angle glaucoma of both eyes, indeterminate stage 10/28/2012   Ptosis of eyelid 10/28/2012   Bladder prolapse, female, acquired 01/09/2012   GERD (gastroesophageal reflux disease) 05/21/2007   Osteoarthritis 05/21/2007    PCP: Myrlene Broker, MD  REFERRING PROVIDER: Myrlene Broker, MD  REFERRING DIAG: M54.50,G89.29 (ICD-10-CM) - Chronic bilateral low back pain without sciatic  Rationale for Evaluation and Treatment: Rehabilitation  THERAPY DIAG:  Chronic bilateral low back pain without sciatica  Abnormal posture  Muscle weakness (generalized)  Difficulty in walking, not elsewhere classified  Cramp and spasm  ONSET DATE: 20+ years ago but recently worsened  SUBJECTIVE:  SUBJECTIVE STATEMENT: I did very well after last time.  I saw Dr. Denyse Amass and he wants me to keep doing what I'm doing.   My pain is a little up this morning but I think it's the rain.  I was fine yesterday. I had a wonderful pool class yesterday. I like the spinal decompression series - I'm using it in the afternoons.  PERTINENT HISTORY:  Osteopenia, scoliosis, Hx of Lt ankle fracture with hardware  PAIN:  PAIN:  Are you having pain? Yes NPRS scale: 5/10 Pain location: midline LBP into bil buttocks Pain orientation:  Bilateral, Medial, and Lower  PAIN TYPE: aching, dull, and tight Pain description: constant  Aggravating factors: prolonged standing and walking up to 1 hour Relieving factors: sitting provides immediate relief but hips get tight   PRECAUTIONS: Other: osteopenia  WEIGHT BEARING RESTRICTIONS: No  FALLS:  Has patient fallen in last 6 months? No  LIVING ENVIRONMENT: Lives with: lives alone Lives in: House/apartment Stairs: Yes: Internal: 16 steps; can reach both Has following equipment at home: Single point cane for longer distance community outings  OCCUPATION: retired  PLOF: Independent  PATIENT GOALS: improve upright posture, strengthen core, return to walking for exercise 2-3x/week  NEXT MD VISIT: about 6 weeks, April, after PT  OBJECTIVE:   DIAGNOSTIC FINDINGS:  XRAY: Degenerative scoliosis convex left concave right. Multilevel DDD. Significant facet arthritis at L3-4 L4-5 and L5-S1  MRI 2018: IMPRESSION: Progression of spondylosis at L3-4 where there is moderate to moderately severe central canal and bilateral subarticular recess stenosis. Severe right foraminal narrowing is also seen at this level.   No notable change in narrowing of the right subarticular recess and foramen at L2-3 due to facet arthropathy and disc.   No change in narrowing of the left subarticular recess at L4-5 mainly due to facet arthropathy. There is moderate central canal stenosis overall at L4-5. No nerve root compression is identified.   Moderate left foraminal stenosis at L5-S1 appears slightly worse on the prior examination.   Severe convex left scoliosis with the apex at L3.  PATIENT SURVEYS:  Modified Oswestry 16%, moderate disability category   SCREENING FOR RED FLAGS: Bowel or bladder incontinence: No Spinal tumors: No Cauda equina syndrome: No Compression fracture: No Abdominal aneurysm: No  COGNITION: Overall cognitive status: Within functional limits for tasks  assessed     SENSATION: WFL - has some "dead places" in Lt foot following fracture/hardware placement  MUSCLE LENGTH: Hamstring WNL bil Limited gluteals and piriformis bil with end range tightness Limited hip flexors bil 50%  POSTURE:  scoliosis present  PALPATION: Tender along bil lumbar spine and adjacent muscles, tender gluteals and piriformis bil  LUMBAR ROM:   AROM eval  Flexion Fingers to ankles, limited lumbar segmental reversal  Extension NT  Right lateral flexion 3, pain  Left lateral flexion 10  Right rotation 75%  Left rotation 50%, pain   (Blank rows = not tested)  LOWER EXTREMITY ROM:     WFL bil  LOWER EXTREMITY MMT:   Bil hips 3+/5 Bil knees 4/5  FUNCTIONAL TESTS:  Unable to balance in SLS on either LE, able to perform half squat, cannot march in place secondary to balance 10/31/22: 3 min walk test: 516.6 feet with back pain 4/10  GAIT: Distance walked: within clinic Assistive device utilized: None Level of assistance: Complete Independence, uses SPC for longer distances Comments: short stride length bil  TODAY'S TREATMENT:  DATE: 11/20/22 Recumbent bike L2 x 5' (Pt doesn't like NuStep), PT present to discuss status Supine lower trunk rotation x10, hold 10 sec final rep SKTC 3x10" each, then DKTC 1x10" Supine piriformis stretch 2x30" bil Supine PPT 10x5" SLR 1x10 bil 2' walk around both gyms Trigger Point Dry-Needling  Treatment instructions: Expect mild to moderate muscle soreness. S/S of pneumothorax if dry needled over a lung field, and to seek immediate medical attention should they occur. Patient verbalized understanding of these instructions and education.  Patient Consent Given: Yes Education handout provided: Previously provided Muscles treated: bil lumbar multifidi Electrical stimulation performed: No Parameters:  N/A Treatment response/outcome: twitch and release STM bil lumbar and thoracic paraspinals, bil hips in prone  DATE: 11/13/22  Recumbent bike L3/L2 x 5' (Pt doesn't like NuStep), PT present to discuss status Discussion about [employer] membership Review of spinal decompression series and reason for doing so, timing should be afternoon to gain more evening hours with less pain Review of HEP supine aspects: PPT, 90/90 heel taps (changed to SLR), TA indraw, lower trunk rotation Trigger Point Dry-Needling  Treatment instructions: Expect mild to moderate muscle soreness. S/S of pneumothorax if dry needled over a lung field, and to seek immediate medical attention should they occur. Patient verbalized understanding of these instructions and education.  Patient Consent Given: Yes Education handout provided: Previously provided Muscles treated: bil lumbar multifidi Electrical stimulation performed: No Parameters: N/A Treatment response/outcome: release and elongation along spine bil   DATE: 10/31/22  Nustep x 5 min level 2 Introduced foam roller for gluteals and hip x 1 min Seated piriformis stretch 3 x 30 sec Seated hip flexor stretch off edge of corner of mat table with one leg behind and other leg fwd (instructed to maintain pelvic tilt with this position Trigger Point Dry-Needling  Treatment instructions: Expect mild to moderate muscle soreness. S/S of pneumothorax if dry needled over a lung field, and to seek immediate medical attention should they occur. Patient verbalized understanding of these instructions and education.  Patient Consent Given: Yes Education handout provided: Yes Muscles treated: lumbar multifidi Electrical stimulation performed: No Parameters: N/A Treatment response/outcome: Skilled palpation used to identify taut bands and trigger points.  Once identified, dry needling techniques used to treat these areas.  Patient reported the deep ache that is desired for effective  needling during treatment. Following treatment, patient stood and felt minimal soreness.   Supine PPT x 20 Supine PPT with 90/90 heel tap  PATIENT EDUCATION:  Education details: Access Code: [email or web address] Person educated: Patient Education method: Explanation, Demonstration, Verbal cues, and Handouts Education comprehension: verbalized understanding and returned demonstration  HOME EXERCISE PROGRAM: Access Code: [email or web address] URL: [email or web address] Date: 11/13/2022 Prepared by: Loistine Simas Dewel Lotter  Exercises - Supine Posterior Pelvic Tilt  - 1 x daily - 7 x weekly - 1 sets - 10 reps - 5-10 sec hold - Supine Lower Trunk Rotation  - 1 x daily - 7 x weekly - 1 sets - 10 reps - Supine Double Knee to Chest  - 1 x daily - 7 x weekly - 1 sets - 3 reps - 20-30 sec hold - Supine Piriformis Stretch with Foot on Ground  - 1 x daily - 7 x weekly - 1 sets - 3 reps - 20-30 sec hold - Standing Hamstring Stretch on Chair  - 1 x daily - 7 x weekly - 1 sets - 3 reps - 30 sec hold - Quadricep Stretch with Chair and Counter Support  - 1  x daily - 7 x weekly - 1 sets - 3 reps - 30 sec hold - Supine Straight Leg Raise  - 1 x daily - 7 x weekly - 1 sets - 10 reps  ASSESSMENT:  CLINICAL IMPRESSION: Pt arrived with some heightened pain and stiffness, perhaps due to weather.  Pt has not yet tried walking at park to check STG.  Session focused on stretching, core and manual techniques which reduced Pt's pain and improved mobility.  Continue along POC.  OBJECTIVE IMPAIRMENTS: Abnormal gait, decreased activity tolerance, decreased balance, decreased mobility, decreased ROM, hypomobility, impaired flexibility, improper body mechanics, postural dysfunction, and pain.   ACTIVITY LIMITATIONS: carrying, lifting, bending, standing, squatting, stairs, and locomotion level  PARTICIPATION LIMITATIONS: meal prep, cleaning, laundry, shopping, and community activity  PERSONAL FACTORS: Age, Time since onset of  injury/illness/exacerbation, and 1 comorbidity: scoliosis, osteopenia  are also affecting patient's functional outcome.   REHAB POTENTIAL: Excellent  CLINICAL DECISION MAKING: Stable/uncomplicated  EVALUATION COMPLEXITY: Low   GOALS: Goals reviewed with patient? Yes  SHORT TERM GOALS: Target date: 11/18/22  Pt will be ind with initial HEP and spinal decompression 1-2x/day. Baseline: Goal status: ongoing  2.  Pt will return to walking at park with seated breaks (benches) 1-2x/week for 10+ min. Baseline:  Goal status: INITIAL    LONG TERM GOALS: Target date: 12/16/22  Pt will be ind with advanced HEP for postural strength, LE functional strength, core strength and flexibility to improve overall activity tolerance. Baseline:  Goal status: INITIAL  2.  Pt will be able to perform standing and walking activity for up to 1 hour with pain not to exceed 4/10. Baseline:  Goal status: INITIAL  3.  Pt will be able to walk at park for up to 20 min with seated breaks as needed (benches) 2 days a week. Baseline:  Goal status: INITIAL  4.  Pt will improve LE strength to at least 4+/5 for improved gait, stairs and squatting for housework. Baseline:  Goal status: INITIAL  5.  Pt will improve ODI score by at least 4 points to reduce score into mild disability category from mod disability. Baseline: 8/50, 16% Goal status: INITIAL   PLAN:  PT FREQUENCY: 1x/week  PT DURATION: 8 weeks  PLANNED INTERVENTIONS: Therapeutic exercises, Therapeutic activity, Neuromuscular re-education, Balance training, Gait training, Patient/Family education, Self Care, Joint mobilization, Aquatic Therapy, Dry Needling, Electrical stimulation, Spinal mobilization, Cryotherapy, Moist heat, Taping, and Manual therapy.  PLAN FOR NEXT SESSION:  check STG, bike (not Nustep), progress supine and/or standing tbands for UE postural strength, core stabilization, DN to glutes and piriformis  Morton PetersJohanna Jesusa Stenerson,  PT 11/20/22 11:02 AM   [facility] [address], [address] [place], [place] [address] Phone # [phone] Fax [phone]

## 2022-11-27 ENCOUNTER — Encounter: Payer: Self-pay | Admitting: Physical Therapy

## 2022-11-27 ENCOUNTER — Ambulatory Visit: Payer: Medicare HMO | Admitting: Physical Therapy

## 2022-11-27 DIAGNOSIS — G8929 Other chronic pain: Secondary | ICD-10-CM | POA: Diagnosis not present

## 2022-11-27 DIAGNOSIS — M6281 Muscle weakness (generalized): Secondary | ICD-10-CM | POA: Diagnosis not present

## 2022-11-27 DIAGNOSIS — R293 Abnormal posture: Secondary | ICD-10-CM

## 2022-11-27 DIAGNOSIS — R262 Difficulty in walking, not elsewhere classified: Secondary | ICD-10-CM

## 2022-11-27 DIAGNOSIS — R252 Cramp and spasm: Secondary | ICD-10-CM

## 2022-11-27 DIAGNOSIS — M545 Low back pain, unspecified: Secondary | ICD-10-CM | POA: Diagnosis not present

## 2022-11-27 NOTE — Therapy (Signed)
OUTPATIENT PHYSICAL THERAPY THORACOLUMBAR EVALUATION   Patient Name: Linda [name] MRN: [id number] DOB:05-09-1941, 82 y.o., female Today's Date: 11/27/2022  END OF SESSION:  PT End of Session - 11/27/22 1019     Visit Number 6    Date for PT Re-Evaluation 12/16/22    Authorization Type [employer] approved 8 visits 10/21/2022-5/6/202    Authorization - Visit Number 5    Authorization - Number of Visits 8    Progress Note Due on Visit 10    PT Start Time 1015    PT Stop Time 1100    PT Time Calculation (min) 45 min    Activity Tolerance Patient tolerated treatment well    Behavior During Therapy WFL for tasks assessed/performed                Past Medical History:  Diagnosis Date   ALLERGIC RHINITIS    Allergy    ANEMIA-IRON DEFICIENCY    Anxiety state, unspecified    Carpal tunnel syndrome    pt denies    Cataract    Closed left ankle fracture    COLONIC POLYPS, HX OF 2007   GERD    GLAUCOMA    Glaucoma    GOITER, MULTINODULAR    on [place] 2006, unchanged 6/13 with nodules all <25mm   HIATAL HERNIA WITH REFLUX    Hip dysplasia, acquired    B sx; eval at [facility] for same 01/2015 -ongoing PT   OSTEOARTHRITIS    Osteoporosis    Serrated polyp of colon    TOBACCO USE, QUIT    Past Surgical History:  Procedure Laterality Date   BREAST SURGERY  1960   Left breast cyst removed no complications   CARPAL TUNNEL RELEASE Right    COLONOSCOPY  2005   EYE SURGERY  1991-1992   both eyes   EYE SURGERY Left 2014   shunt behind left eye   ORIF FIBULA FRACTURE Left 08/24/2019   Procedure: OPEN REDUCTION INTERNAL FIXATION (ORIF) LEFT DISTAL FIBULA FRACTURE;  Surgeon: Teryl Lucy, MD;  Location: [facility];  Service: Orthopedics;  Laterality: Left;   Right shoulder aurgery  10/2008   Patient Active Problem List   Diagnosis Date Noted   Spinal stenosis of lumbar region 10/09/2022   Facet arthritis of lumbar region 10/09/2022   Chronic bilateral low back pain  without sciatica 10/04/2022   Acute cough 08/08/2022   Lower respiratory infection 08/08/2022   Osteopenia 11/26/2021   Pre-diabetes 11/26/2021   Other fatigue 07/28/2020   Dupuytren's disease of palm 01/19/2018   Bilateral sensorineural hearing loss 03/12/2016   Osteoarthritis of both hips resulting from hip dysplasia 01/25/2015   Primary osteoarthritis of right hip 01/25/2015   Anemia 11/06/2012   Anxiety 11/06/2012   Depression 11/06/2012   Cataract, nuclear 10/28/2012   Primary open angle glaucoma of both eyes, indeterminate stage 10/28/2012   Ptosis of eyelid 10/28/2012   Bladder prolapse, female, acquired 01/09/2012   GERD (gastroesophageal reflux disease) 05/21/2007   Osteoarthritis 05/21/2007    PCP: Myrlene Broker, MD  REFERRING PROVIDER: Myrlene Broker, MD  REFERRING DIAG: M54.50,G89.29 (ICD-10-CM) - Chronic bilateral low back pain without sciatic  Rationale for Evaluation and Treatment: Rehabilitation  THERAPY DIAG:  Chronic bilateral low back pain without sciatica  Abnormal posture  Muscle weakness (generalized)  Difficulty in walking, not elsewhere classified  Cramp and spasm  ONSET DATE: 20+ years ago but recently worsened  SUBJECTIVE:  SUBJECTIVE STATEMENT: I am lethargic today.  I have tried to take walks but it was painful.  I feel more limited by my hips than my back.    PERTINENT HISTORY:  Osteopenia, scoliosis, Hx of Lt ankle fracture with hardware  PAIN:  PAIN:  Are you having pain? Yes NPRS scale: 6-7/10 Pain location: midline LBP into bil buttocks Pain orientation: Bilateral, Medial, and Lower  PAIN TYPE: aching, dull, and tight Pain description: constant  Aggravating factors: prolonged standing and walking up to 1 hour Relieving factors:  sitting provides immediate relief but hips get tight   PRECAUTIONS: Other: osteopenia  WEIGHT BEARING RESTRICTIONS: No  FALLS:  Has patient fallen in last 6 months? No  LIVING ENVIRONMENT: Lives with: lives alone Lives in: House/apartment Stairs: Yes: Internal: 16 steps; can reach both Has following equipment at home: Single point cane for longer distance community outings  OCCUPATION: retired  PLOF: Independent  PATIENT GOALS: improve upright posture, strengthen core, return to walking for exercise 2-3x/week  NEXT MD VISIT: about 6 weeks, April, after PT  OBJECTIVE:   DIAGNOSTIC FINDINGS:  XRAY: Degenerative scoliosis convex left concave right. Multilevel DDD. Significant facet arthritis at L3-4 L4-5 and L5-S1  MRI 2018: IMPRESSION: Progression of spondylosis at L3-4 where there is moderate to moderately severe central canal and bilateral subarticular recess stenosis. Severe right foraminal narrowing is also seen at this level.   No notable change in narrowing of the right subarticular recess and foramen at L2-3 due to facet arthropathy and disc.   No change in narrowing of the left subarticular recess at L4-5 mainly due to facet arthropathy. There is moderate central canal stenosis overall at L4-5. No nerve root compression is identified.   Moderate left foraminal stenosis at L5-S1 appears slightly worse on the prior examination.   Severe convex left scoliosis with the apex at L3.  PATIENT SURVEYS:  Modified Oswestry 16%, moderate disability category   SCREENING FOR RED FLAGS: Bowel or bladder incontinence: No Spinal tumors: No Cauda equina syndrome: No Compression fracture: No Abdominal aneurysm: No  COGNITION: Overall cognitive status: Within functional limits for tasks assessed     SENSATION: WFL - has some "dead places" in Lt foot following fracture/hardware placement  MUSCLE LENGTH: Hamstring WNL bil Limited gluteals and piriformis bil with  end range tightness Limited hip flexors bil 50%  POSTURE:  scoliosis present  PALPATION: Tender along bil lumbar spine and adjacent muscles, tender gluteals and piriformis bil  LUMBAR ROM:   AROM eval  Flexion Fingers to ankles, limited lumbar segmental reversal  Extension NT  Right lateral flexion 3, pain  Left lateral flexion 10  Right rotation 75%  Left rotation 50%, pain   (Blank rows = not tested)  LOWER EXTREMITY ROM:     WFL bil  LOWER EXTREMITY MMT:   Bil hips 3+/5 Bil knees 4/5  FUNCTIONAL TESTS:  Unable to balance in SLS on either LE, able to perform half squat, cannot march in place secondary to balance 10/31/22: 3 min walk test: 516.6 feet with back pain 4/10  GAIT: Distance walked: within clinic Assistive device utilized: None Level of assistance: Complete Independence, uses SPC for longer distances Comments: short stride length bil  TODAY'S TREATMENT:  DATE: 11/27/22 Recumbent bike L2 x 5' (Pt doesn't like NuStep), PT present to discuss status Bil piriformis stretch edge of table table 2x30" (HEP) Hip flexor stretch on stairs and in doorway with UE reach up and across (HEP for doorway) Hip flexor stretch Rt LE off edge of table (HEP) Trigger Point Dry-Needling  Treatment instructions: Expect mild to moderate muscle soreness. S/S of pneumothorax if dry needled over a lung field, and to seek immediate medical attention should they occur. Patient verbalized understanding of these instructions and education.  Patient Consent Given: Yes Education handout provided: Previously provided Muscles treated: Rt TFL, glut min, glut med, piriformis Electrical stimulation performed: Yes Parameters: N/A Treatment response/outcome: more twitches in TFL and glut min than more posterior muscles STM Rt hip after DN, aftercare reminders for heat     DATE: 11/20/22 Recumbent bike L2 x 5' (Pt doesn't like NuStep), PT present to discuss status Supine lower trunk rotation x10, hold 10 sec final rep SKTC 3x10" each, then DKTC 1x10" Supine piriformis stretch 2x30" bil Supine PPT 10x5" SLR 1x10 bil 2' walk around both gyms Trigger Point Dry-Needling  Treatment instructions: Expect mild to moderate muscle soreness. S/S of pneumothorax if dry needled over a lung field, and to seek immediate medical attention should they occur. Patient verbalized understanding of these instructions and education.  Patient Consent Given: Yes Education handout provided: Previously provided Muscles treated: bil lumbar multifidi Electrical stimulation performed: No Parameters: N/A Treatment response/outcome: twitch and release STM bil lumbar and thoracic paraspinals, bil hips in prone  DATE: 11/13/22  Recumbent bike L3/L2 x 5' (Pt doesn't like NuStep), PT present to discuss status Discussion about [employer] membership Review of spinal decompression series and reason for doing so, timing should be afternoon to gain more evening hours with less pain Review of HEP supine aspects: PPT, 90/90 heel taps (changed to SLR), TA indraw, lower trunk rotation Trigger Point Dry-Needling  Treatment instructions: Expect mild to moderate muscle soreness. S/S of pneumothorax if dry needled over a lung field, and to seek immediate medical attention should they occur. Patient verbalized understanding of these instructions and education.  Patient Consent Given: Yes Education handout provided: Previously provided Muscles treated: bil lumbar multifidi Electrical stimulation performed: No Parameters: N/A Treatment response/outcome: release and elongation along spine bil   PATIENT EDUCATION:  Education details: Access Code: [email or web address] Person educated: Patient Education method: Programmer, multimedia, Demonstration, Verbal cues, and Handouts Education comprehension: verbalized  understanding and returned demonstration  HOME EXERCISE PROGRAM: Access Code: [email or web address] URL: [email or web address] Date: 11/27/2022 Prepared by: Loistine Simas Porchea Charrier  Exercises - Supine Posterior Pelvic Tilt  - 1 x daily - 7 x weekly - 1 sets - 10 reps - 5-10 sec hold - Supine Lower Trunk Rotation  - 1 x daily - 7 x weekly - 1 sets - 10 reps - Supine Double Knee to Chest  - 1 x daily - 7 x weekly - 1 sets - 3 reps - 20-30 sec hold - Supine Piriformis Stretch with Foot on Ground  - 1 x daily - 7 x weekly - 1 sets - 3 reps - 20-30 sec hold - Standing Hamstring Stretch on Chair  - 1 x daily - 7 x weekly - 1 sets - 3 reps - 30 sec hold - Quadricep Stretch with Chair and Counter Support  - 1 x daily - 7 x weekly - 1 sets - 3 reps - 30 sec hold - Supine Straight Leg Raise  - 1 x daily - 7  x weekly - 1 sets - 10 reps - Seated Table Piriformis Stretch  - 1 x daily - 7 x weekly - 1 sets - 3 reps - 30 sec hold - Hip Flexor Stretch at Edge of Bed  - 1 x daily - 7 x weekly - 1 sets - 3 reps - 30-60 sec hold - Doorway Hip Flexor Stretch with Chair  - 1 x daily - 7 x weekly - 1 sets - 3 reps - 30 sec hold  ASSESSMENT:  CLINICAL IMPRESSION: Pt has attempted 10' walk for STG but reported increase in bil hip pain vs back pain.  PT revised some stretches to target lower quadrant and hip tightness today which were well tolerated and gave some improved relief (updated HEP).  Also added TPDN to Rt lateral and posterior hip today given DN has reduced LBP and hips seem to be remaining sources of pain. Discussed need to commit to stretching aspects of HEP as tools for mobility and pain reduction.    OBJECTIVE IMPAIRMENTS: Abnormal gait, decreased activity tolerance, decreased balance, decreased mobility, decreased ROM, hypomobility, impaired flexibility, improper body mechanics, postural dysfunction, and pain.   ACTIVITY LIMITATIONS: carrying, lifting, bending, standing, squatting, stairs, and locomotion  level  PARTICIPATION LIMITATIONS: meal prep, cleaning, laundry, shopping, and community activity  PERSONAL FACTORS: Age, Time since onset of injury/illness/exacerbation, and 1 comorbidity: scoliosis, osteopenia  are also affecting patient's functional outcome.   REHAB POTENTIAL: Excellent  CLINICAL DECISION MAKING: Stable/uncomplicated  EVALUATION COMPLEXITY: Low   GOALS: Goals reviewed with patient? Yes  SHORT TERM GOALS: Target date: 11/18/22  Pt will be ind with initial HEP and spinal decompression 1-2x/day. Baseline: Goal status: met 4/17  2.  Pt will return to walking at park with seated breaks (benches) 1-2x/week for 10+ min. Baseline:  Goal status: 10 min, but increased hip pain bil, met 4/17    LONG TERM GOALS: Target date: 12/16/22  Pt will be ind with advanced HEP for postural strength, LE functional strength, core strength and flexibility to improve overall activity tolerance. Baseline:  Goal status: INITIAL  2.  Pt will be able to perform standing and walking activity for up to 1 hour with pain not to exceed 4/10. Baseline:  Goal status: INITIAL  3.  Pt will be able to walk at park for up to 20 min with seated breaks as needed (benches) 2 days a week. Baseline:  Goal status: INITIAL  4.  Pt will improve LE strength to at least 4+/5 for improved gait, stairs and squatting for housework. Baseline:  Goal status: INITIAL  5.  Pt will improve ODI score by at least 4 points to reduce score into mild disability category from mod disability. Baseline: 8/50, 16% Goal status: INITIAL   PLAN:  PT FREQUENCY: 1x/week  PT DURATION: 8 weeks  PLANNED INTERVENTIONS: Therapeutic exercises, Therapeutic activity, Neuromuscular re-education, Balance training, Gait training, Patient/Family education, Self Care, Joint mobilization, Aquatic Therapy, Dry Needling, Electrical stimulation, Spinal mobilization, Cryotherapy, Moist heat, Taping, and Manual therapy.  PLAN FOR NEXT  SESSION:  update LTG and do ODI, bike (not Nustep), progress supine and/or standing tbands for UE postural strength, core stabilization, DN to glutes and piriformis  Morton Peters, PT 11/27/22 11:06 AM    [facility] [address] [place], [place] [address] Phone # [phone] Fax [phone]

## 2022-12-04 ENCOUNTER — Encounter: Payer: Self-pay | Admitting: Physical Therapy

## 2022-12-04 ENCOUNTER — Ambulatory Visit: Payer: Medicare HMO | Admitting: Physical Therapy

## 2022-12-04 DIAGNOSIS — G8929 Other chronic pain: Secondary | ICD-10-CM

## 2022-12-04 DIAGNOSIS — M6281 Muscle weakness (generalized): Secondary | ICD-10-CM

## 2022-12-04 DIAGNOSIS — R293 Abnormal posture: Secondary | ICD-10-CM | POA: Diagnosis not present

## 2022-12-04 DIAGNOSIS — R262 Difficulty in walking, not elsewhere classified: Secondary | ICD-10-CM | POA: Diagnosis not present

## 2022-12-04 DIAGNOSIS — R252 Cramp and spasm: Secondary | ICD-10-CM

## 2022-12-04 DIAGNOSIS — M545 Low back pain, unspecified: Secondary | ICD-10-CM | POA: Diagnosis not present

## 2022-12-04 NOTE — Therapy (Addendum)
OUTPATIENT PHYSICAL THERAPY THORACOLUMBAR EVALUATION   Patient Name: Linda [name] MRN: [id number] DOB:[birth year], 82 y.o., female Today's Date: 12/04/2022  END OF SESSION:  PT End of Session - 12/04/22 1021     Visit Number 7    Date for PT Re-Evaluation 12/16/22    Authorization Type [employer] approved 8 visits 10/21/2022-5/6/202    Authorization - Visit Number 6    Authorization - Number of Visits 8    Progress Note Due on Visit 10    PT Start Time 1018    PT Stop Time 1100    PT Time Calculation (min) 42 min    Activity Tolerance Patient tolerated treatment well    Behavior During Therapy WFL for tasks assessed/performed                 Past Medical History:  Diagnosis Date   ALLERGIC RHINITIS    Allergy    ANEMIA-IRON DEFICIENCY    Anxiety state, unspecified    Carpal tunnel syndrome    pt denies    Cataract    Closed left ankle fracture    COLONIC POLYPS, HX OF 2007   GERD    GLAUCOMA    Glaucoma    GOITER, MULTINODULAR    on [place] 2006, unchanged 6/13 with nodules all <30mm   HIATAL HERNIA WITH REFLUX    Hip dysplasia, acquired    B sx; eval at [facility] for same 01/2015 -ongoing PT   OSTEOARTHRITIS    Osteoporosis    Serrated polyp of colon    TOBACCO USE, QUIT    Past Surgical History:  Procedure Laterality Date   BREAST SURGERY  1960   Left breast cyst removed no complications   CARPAL TUNNEL RELEASE Right    COLONOSCOPY  2005   EYE SURGERY  1991-1992   both eyes   EYE SURGERY Left 2014   shunt behind left eye   ORIF FIBULA FRACTURE Left 08/24/2019   Procedure: OPEN REDUCTION INTERNAL FIXATION (ORIF) LEFT DISTAL FIBULA FRACTURE;  Surgeon: Teryl Lucy, MD;  Location: [facility];  Service: Orthopedics;  Laterality: Left;   Right shoulder aurgery  10/2008   Patient Active Problem List   Diagnosis Date Noted   Spinal stenosis of lumbar region 10/09/2022   Facet arthritis of lumbar region 10/09/2022   Chronic bilateral low back pain  without sciatica 10/04/2022   Acute cough 08/08/2022   Lower respiratory infection 08/08/2022   Osteopenia 11/26/2021   Pre-diabetes 11/26/2021   Other fatigue 07/28/2020   Dupuytren's disease of palm 01/19/2018   Bilateral sensorineural hearing loss 03/12/2016   Osteoarthritis of both hips resulting from hip dysplasia 01/25/2015   Primary osteoarthritis of right hip 01/25/2015   Anemia 11/06/2012   Anxiety 11/06/2012   Depression 11/06/2012   Cataract, nuclear 10/28/2012   Primary open angle glaucoma of both eyes, indeterminate stage 10/28/2012   Ptosis of eyelid 10/28/2012   Bladder prolapse, female, acquired 01/09/2012   GERD (gastroesophageal reflux disease) 05/21/2007   Osteoarthritis 05/21/2007    PCP: Myrlene Broker, MD  REFERRING PROVIDER: Myrlene Broker, MD  REFERRING DIAG: M54.50,G89.29 (ICD-10-CM) - Chronic bilateral low back pain without sciatic  Rationale for Evaluation and Treatment: Rehabilitation  THERAPY DIAG:  Chronic bilateral low back pain without sciatica  Abnormal posture  Muscle weakness (generalized)  Difficulty in walking, not elsewhere classified  Cramp and spasm  ONSET DATE: 20+ years ago but recently worsened  SUBJECTIVE:  SUBJECTIVE STATEMENT: I am lethargic today.  I have tried to take walks but it was painful.  I feel more limited by my hips than my back.    PERTINENT HISTORY:  Osteopenia, scoliosis, Hx of Lt ankle fracture with hardware  PAIN:  PAIN:  Are you having pain? Yes NPRS scale: 6-7/10 Pain location: midline LBP into bil buttocks Pain orientation: Bilateral, Medial, and Lower  PAIN TYPE: aching, dull, and tight Pain description: constant  Aggravating factors: prolonged standing and walking up to 1 hour Relieving factors:  sitting provides immediate relief but hips get tight   PRECAUTIONS: Other: osteopenia  WEIGHT BEARING RESTRICTIONS: No  FALLS:  Has patient fallen in last 6 months? No  LIVING ENVIRONMENT: Lives with: lives alone Lives in: House/apartment Stairs: Yes: Internal: 16 steps; can reach both Has following equipment at home: Single point cane for longer distance community outings  OCCUPATION: retired  PLOF: Independent  PATIENT GOALS: improve upright posture, strengthen core, return to walking for exercise 2-3x/week  NEXT MD VISIT: about 6 weeks, April, after PT  OBJECTIVE:   DIAGNOSTIC FINDINGS:  XRAY: Degenerative scoliosis convex left concave right. Multilevel DDD. Significant facet arthritis at L3-4 L4-5 and L5-S1  MRI 2018: IMPRESSION: Progression of spondylosis at L3-4 where there is moderate to moderately severe central canal and bilateral subarticular recess stenosis. Severe right foraminal narrowing is also seen at this level.   No notable change in narrowing of the right subarticular recess and foramen at L2-3 due to facet arthropathy and disc.   No change in narrowing of the left subarticular recess at L4-5 mainly due to facet arthropathy. There is moderate central canal stenosis overall at L4-5. No nerve root compression is identified.   Moderate left foraminal stenosis at L5-S1 appears slightly worse on the prior examination.   Severe convex left scoliosis with the apex at L3.  PATIENT SURVEYS:  Modified Oswestry 16%, moderate disability category   SCREENING FOR RED FLAGS: Bowel or bladder incontinence: No Spinal tumors: No Cauda equina syndrome: No Compression fracture: No Abdominal aneurysm: No  COGNITION: Overall cognitive status: Within functional limits for tasks assessed     SENSATION: WFL - has some "dead places" in Lt foot following fracture/hardware placement  MUSCLE LENGTH: Hamstring WNL bil Limited gluteals and piriformis bil with  end range tightness Limited hip flexors bil 50%  POSTURE:  scoliosis present  PALPATION: Tender along bil lumbar spine and adjacent muscles, tender gluteals and piriformis bil  LUMBAR ROM:   AROM eval  Flexion Fingers to ankles, limited lumbar segmental reversal  Extension NT  Right lateral flexion 3, pain  Left lateral flexion 10  Right rotation 75%  Left rotation 50%, pain   (Blank rows = not tested)  LOWER EXTREMITY ROM:     WFL bil  LOWER EXTREMITY MMT:   Bil hips 3+/5 Bil knees 4/5  FUNCTIONAL TESTS:  Unable to balance in SLS on either LE, able to perform half squat, cannot march in place secondary to balance 10/31/22: 3 min walk test: 516.6 feet with back pain 4/10  GAIT: Distance walked: within clinic Assistive device utilized: None Level of assistance: Complete Independence, uses SPC for longer distances Comments: short stride length bil  TODAY'S TREATMENT:  DATE: 12/04/22 Recumbent bike L2 x 5' (Pt doesn't like NuStep), PT present to discuss status Recommend thick yoga mat for floor routine to soften surface Supine hip flexor stretch off edge of table x60' bil Supine PPT x 10 Leg lengthener from spinal decompression series 3x5" each LE Fig 4 push away/pull across bil 3x20" Trial of elliptical x 2' Standing L counter stretch Counter plank x 30" Discussion about goals of PT (mobility, flexibility, lengthening, decompression)  DATE: 11/27/22 Recumbent bike L2 x 5' (Pt doesn't like NuStep), PT present to discuss status Bil piriformis stretch edge of table table 2x30" (HEP) Hip flexor stretch on stairs and in doorway with UE reach up and across (HEP for doorway) Hip flexor stretch Rt LE off edge of table (HEP) Trigger Point Dry-Needling  Treatment instructions: Expect mild to moderate muscle soreness. S/S of pneumothorax if dry needled  over a lung field, and to seek immediate medical attention should they occur. Patient verbalized understanding of these instructions and education.  Patient Consent Given: Yes Education handout provided: Previously provided Muscles treated: Rt TFL, glut min, glut med, piriformis Electrical stimulation performed: Yes Parameters: N/A Treatment response/outcome: more twitches in TFL and glut min than more posterior muscles STM Rt hip after DN, aftercare reminders for heat    DATE: 11/20/22 Recumbent bike L2 x 5' (Pt doesn't like NuStep), PT present to discuss status Supine lower trunk rotation x10, hold 10 sec final rep SKTC 3x10" each, then DKTC 1x10" Supine piriformis stretch 2x30" bil Supine PPT 10x5" SLR 1x10 bil 2' walk around both gyms Trigger Point Dry-Needling  Treatment instructions: Expect mild to moderate muscle soreness. S/S of pneumothorax if dry needled over a lung field, and to seek immediate medical attention should they occur. Patient verbalized understanding of these instructions and education.  Patient Consent Given: Yes Education handout provided: Previously provided Muscles treated: bil lumbar multifidi Electrical stimulation performed: No Parameters: N/A Treatment response/outcome: twitch and release STM bil lumbar and thoracic paraspinals, bil hips in prone  DATE: 11/13/22  Recumbent bike L3/L2 x 5' (Pt doesn't like NuStep), PT present to discuss status Discussion about [employer] membership Review of spinal decompression series and reason for doing so, timing should be afternoon to gain more evening hours with less pain Review of HEP supine aspects: PPT, 90/90 heel taps (changed to SLR), TA indraw, lower trunk rotation Trigger Point Dry-Needling  Treatment instructions: Expect mild to moderate muscle soreness. S/S of pneumothorax if dry needled over a lung field, and to seek immediate medical attention should they occur. Patient verbalized understanding of these  instructions and education.  Patient Consent Given: Yes Education handout provided: Previously provided Muscles treated: bil lumbar multifidi Electrical stimulation performed: No Parameters: N/A Treatment response/outcome: release and elongation along spine bil   PATIENT EDUCATION:  Education details: Access Code: [email or web address] Person educated: Patient Education method: Programmer, multimedia, Demonstration, Verbal cues, and Handouts Education comprehension: verbalized understanding and returned demonstration  HOME EXERCISE PROGRAM: Access Code: [email or web address] URL: [email or web address] Date: 11/27/2022 Prepared by: Loistine Simas Zionah Criswell  Exercises - Supine Posterior Pelvic Tilt  - 1 x daily - 7 x weekly - 1 sets - 10 reps - 5-10 sec hold - Supine Lower Trunk Rotation  - 1 x daily - 7 x weekly - 1 sets - 10 reps - Supine Double Knee to Chest  - 1 x daily - 7 x weekly - 1 sets - 3 reps - 20-30 sec hold - Supine Piriformis Stretch with Foot on Ground  - 1 x  daily - 7 x weekly - 1 sets - 3 reps - 20-30 sec hold - Standing Hamstring Stretch on Chair  - 1 x daily - 7 x weekly - 1 sets - 3 reps - 30 sec hold - Quadricep Stretch with Chair and Counter Support  - 1 x daily - 7 x weekly - 1 sets - 3 reps - 30 sec hold - Supine Straight Leg Raise  - 1 x daily - 7 x weekly - 1 sets - 10 reps - Seated Table Piriformis Stretch  - 1 x daily - 7 x weekly - 1 sets - 3 reps - 30 sec hold - Hip Flexor Stretch at Edge of Bed  - 1 x daily - 7 x weekly - 1 sets - 3 reps - 30-60 sec hold - Doorway Hip Flexor Stretch with Chair  - 1 x daily - 7 x weekly - 1 sets - 3 reps - 30 sec hold  ASSESSMENT:  CLINICAL IMPRESSION: PT reviewed HEP for stretching and spinal decompression components today.  Also did a trial of elliptical per Pt's request as she may consider using recumbent bike and elliptical at [employer] where she does her pool classes 2x/week.  Pt continues to be very stiff and limited by pain for time on her  feet and needs daily stretching and decompression to manage.  Discussed how stretching and decompression are very important parts of the "piece of the pie" from PT, alongside her aquatic classes and possible addition of bike/elliptical at [employer]. ERO next visit with possible need to extend to maximize mobility and ind with light strength component of PT.  OBJECTIVE IMPAIRMENTS: Abnormal gait, decreased activity tolerance, decreased balance, decreased mobility, decreased ROM, hypomobility, impaired flexibility, improper body mechanics, postural dysfunction, and pain.   ACTIVITY LIMITATIONS: carrying, lifting, bending, standing, squatting, stairs, and locomotion level  PARTICIPATION LIMITATIONS: meal prep, cleaning, laundry, shopping, and community activity  PERSONAL FACTORS: Age, Time since onset of injury/illness/exacerbation, and 1 comorbidity: scoliosis, osteopenia  are also affecting patient's functional outcome.   REHAB POTENTIAL: Excellent  CLINICAL DECISION MAKING: Stable/uncomplicated  EVALUATION COMPLEXITY: Low   GOALS: Goals reviewed with patient? Yes  SHORT TERM GOALS: Target date: 11/18/22  Pt will be ind with initial HEP and spinal decompression 1-2x/day. Baseline: Goal status: met 4/17  2.  Pt will return to walking at park with seated breaks (benches) 1-2x/week for 10+ min. Baseline:  Goal status: 10 min, but increased hip pain bil, met 4/17    LONG TERM GOALS: Target date: 12/16/22  Pt will be ind with advanced HEP for postural strength, LE functional strength, core strength and flexibility to improve overall activity tolerance. Baseline:  Goal status: INITIAL  2.  Pt will be able to perform standing and walking activity for up to 1 hour with pain not to exceed 4/10. Baseline:  Goal status: INITIAL  3.  Pt will be able to walk at park for up to 20 min with seated breaks as needed (benches) 2 days a week. Baseline:  Goal status: DEFERRED per patient  4/24  4.  Pt will improve LE strength to at least 4+/5 for improved gait, stairs and squatting for housework. Baseline:  Goal status: INITIAL  5.  Pt will improve ODI score by at least 4 points to reduce score into mild disability category from mod disability. Baseline: 8/50, 16% Goal status: INITIAL   PLAN:  PT FREQUENCY: 1x/week  PT DURATION: 8 weeks  PLANNED INTERVENTIONS: Therapeutic  exercises, Therapeutic activity, Neuromuscular re-education, Balance training, Gait training, Patient/Family education, Self Care, Joint mobilization, Aquatic Therapy, Dry Needling, Electrical stimulation, Spinal mobilization, Cryotherapy, Moist heat, Taping, and Manual therapy.  PLAN FOR NEXT SESSION:  ERO next time, focus on stretching, mobility, decompression.  Do we want to do an extension for more strength or rely on aquatics/gym bike and/or elliptical at [employer] for that piece?  update LTG and do ODI, bike (not Nustep), progress supine and/or standing tbands for UE postural strength, core stabilization, DN to glutes and piriformis  Morton Peters, PT 12/04/22 1:17 PM      [facility] [address], [address] [place], [place] [address] Phone # [phone] Fax [phone]

## 2022-12-05 ENCOUNTER — Ambulatory Visit (INDEPENDENT_AMBULATORY_CARE_PROVIDER_SITE_OTHER): Payer: Medicare HMO

## 2022-12-05 VITALS — Ht 62.0 in | Wt 141.0 lb

## 2022-12-05 DIAGNOSIS — Z0000 Encounter for general adult medical examination without abnormal findings: Secondary | ICD-10-CM

## 2022-12-05 NOTE — Patient Instructions (Addendum)
Linda [name] , Thank you for taking time to come for your Medicare Wellness Visit. I appreciate your ongoing commitment to your health goals. Please review the following plan we discussed and let me know if I can assist you in the future.   These are the goals we discussed:  Goals      Manage My Medicine     Timeframe:  Long-Range Goal Priority:  Medium Start Date:        10/12/20                     Expected End Date:    11/01/2022                   Follow Up Date July 2023   - call for medicine refill 2 or 3 days before it runs out - call if I am sick and can't take my medicine - keep a list of all the medicines I take; vitamins and herbals too - use a pillbox to sort medicine  -Start taking Calcium-Vitamin D to keep bones healthy   Why is this important?   These steps will help you keep on track with your medicines.   Notes:      My goal for 2024 is to maintain my weight and continue to eat healthy and stay hydrated.        This is a list of the screening recommended for you and due dates:  Health Maintenance  Topic Date Due   COVID-19 Vaccine (4 - 2023-24 season) 04/12/2022   Flu Shot  03/13/2023   Medicare Annual Wellness Visit  12/05/2023   DTaP/Tdap/Td vaccine (3 - Td or Tdap) 02/24/2024   Pneumonia Vaccine  Completed   DEXA scan (bone density measurement)  Completed   Zoster (Shingles) Vaccine  Completed   HPV Vaccine  Aged Out   Colon Cancer Screening  Discontinued    Advanced directives: Yes; documents on file.  Conditions/risks identified: Yes  Next appointment: Follow up in one year for your annual wellness visit.   Preventive Care 28 Years and Older, Female Preventive care refers to lifestyle choices and visits with your health care provider that can promote health and wellness. What does preventive care include? A yearly physical exam. This is also called an annual well check. Dental exams once or twice a year. Routine eye exams. Ask your health care  provider how often you should have your eyes checked. Personal lifestyle choices, including: Daily care of your teeth and gums. Regular physical activity. Eating a healthy diet. Avoiding tobacco and drug use. Limiting alcohol use. Practicing safe sex. Taking low-dose aspirin every day. Taking vitamin and mineral supplements as recommended by your health care provider. What happens during an annual well check? The services and screenings done by your health care provider during your annual well check will depend on your age, overall health, lifestyle risk factors, and family history of disease. Counseling  Your health care provider may ask you questions about your: Alcohol use. Tobacco use. Drug use. Emotional well-being. Home and relationship well-being. Sexual activity. Eating habits. History of falls. Memory and ability to understand (cognition). Work and work Astronomer. Reproductive health. Screening  You may have the following tests or measurements: Height, weight, and BMI. Blood pressure. Lipid and cholesterol levels. These may be checked every 5 years, or more frequently if you are over 33 years old. Skin check. Lung cancer screening. You may have this screening every year starting at  age 9 if you have a 30-pack-year history of smoking and currently smoke or have quit within the past 15 years. Fecal occult blood test (FOBT) of the stool. You may have this test every year starting at age 14. Flexible sigmoidoscopy or colonoscopy. You may have a sigmoidoscopy every 5 years or a colonoscopy every 10 years starting at age 9. Hepatitis C blood test. Hepatitis B blood test. Sexually transmitted disease (STD) testing. Diabetes screening. This is done by checking your blood sugar (glucose) after you have not eaten for a while (fasting). You may have this done every 1-3 years. Bone density scan. This is done to screen for osteoporosis. You may have this done starting at age  42. Mammogram. This may be done every 1-2 years. Talk to your health care provider about how often you should have regular mammograms. Talk with your health care provider about your test results, treatment options, and if necessary, the need for more tests. Vaccines  Your health care provider may recommend certain vaccines, such as: Influenza vaccine. This is recommended every year. Tetanus, diphtheria, and acellular pertussis (Tdap, Td) vaccine. You may need a Td booster every 10 years. Zoster vaccine. You may need this after age 75. Pneumococcal 13-valent conjugate (PCV13) vaccine. One dose is recommended after age 21. Pneumococcal polysaccharide (PPSV23) vaccine. One dose is recommended after age 37. Talk to your health care provider about which screenings and vaccines you need and how often you need them. This information is not intended to replace advice given to you by your health care provider. Make sure you discuss any questions you have with your health care provider. Document Released: 08/25/2015 Document Revised: 04/17/2016 Document Reviewed: 05/30/2015 Elsevier Interactive Patient Education  2017 [employer].  Fall Prevention in the Home Falls can cause injuries. They can happen to people of all ages. There are many things you can do to make your home safe and to help prevent falls. What can I do on the outside of my home? Regularly fix the edges of walkways and driveways and fix any cracks. Remove anything that might make you trip as you walk through a door, such as a raised step or threshold. Trim any bushes or trees on the path to your home. Use bright outdoor lighting. Clear any walking paths of anything that might make someone trip, such as rocks or tools. Regularly check to see if handrails are loose or broken. Make sure that both sides of any steps have handrails. Any raised decks and porches should have guardrails on the edges. Have any leaves, snow, or ice cleared  regularly. Use sand or salt on walking paths during winter. Clean up any spills in your garage right away. This includes oil or grease spills. What can I do in the bathroom? Use night lights. Install grab bars by the toilet and in the tub and shower. Do not use towel bars as grab bars. Use non-skid mats or decals in the tub or shower. If you need to sit down in the shower, use a plastic, non-slip stool. Keep the floor dry. Clean up any water that spills on the floor as soon as it happens. Remove soap buildup in the tub or shower regularly. Attach bath mats securely with double-sided non-slip rug tape. Do not have throw rugs and other things on the floor that can make you trip. What can I do in the bedroom? Use night lights. Make sure that you have a light by your bed that is easy to  reach. Do not use any sheets or blankets that are too big for your bed. They should not hang down onto the floor. Have a firm chair that has side arms. You can use this for support while you get dressed. Do not have throw rugs and other things on the floor that can make you trip. What can I do in the kitchen? Clean up any spills right away. Avoid walking on wet floors. Keep items that you use a lot in easy-to-reach places. If you need to reach something above you, use a strong step stool that has a grab bar. Keep electrical cords out of the way. Do not use floor polish or wax that makes floors slippery. If you must use wax, use non-skid floor wax. Do not have throw rugs and other things on the floor that can make you trip. What can I do with my stairs? Do not leave any items on the stairs. Make sure that there are handrails on both sides of the stairs and use them. Fix handrails that are broken or loose. Make sure that handrails are as long as the stairways. Check any carpeting to make sure that it is firmly attached to the stairs. Fix any carpet that is loose or worn. Avoid having throw rugs at the top or  bottom of the stairs. If you do have throw rugs, attach them to the floor with carpet tape. Make sure that you have a light switch at the top of the stairs and the bottom of the stairs. If you do not have them, ask someone to add them for you. What else can I do to help prevent falls? Wear shoes that: Do not have high heels. Have rubber bottoms. Are comfortable and fit you well. Are closed at the toe. Do not wear sandals. If you use a stepladder: Make sure that it is fully opened. Do not climb a closed stepladder. Make sure that both sides of the stepladder are locked into place. Ask someone to hold it for you, if possible. Clearly mark and make sure that you can see: Any grab bars or handrails. First and last steps. Where the edge of each step is. Use tools that help you move around (mobility aids) if they are needed. These include: Canes. Walkers. Scooters. Crutches. Turn on the lights when you go into a dark area. Replace any light bulbs as soon as they burn out. Set up your furniture so you have a clear path. Avoid moving your furniture around. If any of your floors are uneven, fix them. If there are any pets around you, be aware of where they are. Review your medicines with your doctor. Some medicines can make you feel dizzy. This can increase your chance of falling. Ask your doctor what other things that you can do to help prevent falls. This information is not intended to replace advice given to you by your health care provider. Make sure you discuss any questions you have with your health care provider. Document Released: 05/25/2009 Document Revised: 01/04/2016 Document Reviewed: 09/02/2014 Elsevier Interactive Patient Education  2017 [employer].

## 2022-12-05 NOTE — Progress Notes (Addendum)
I connected with  [name] on 12/05/22 by a audio enabled telemedicine application and verified that I am speaking with the correct person using two identifiers.  Patient Location: Home  Provider Location: Office/Clinic  I discussed the limitations of evaluation and management by telemedicine. The patient expressed understanding and agreed to proceed.  Patient Medicare AWV questionnaire was completed by the patient on 12/04/2022; I have confirmed that all information answered by patient is correct and no changes since this date.    Subjective:   Linda [name] is a 82 y.o. female who presents for Medicare Annual (Subsequent) preventive examination.  Review of Systems     Cardiac Risk Factors include: advanced age (>60men, >50 women);family history of premature cardiovascular disease     Objective:    Today's Vitals   12/05/22 1105 12/05/22 1112  Weight: 141 lb (64 kg)   Height: 5\' 2"  (1.575 m)   PainSc: 5  5   PainLoc: Back    Body mass index is 25.79 kg/m.     12/05/2022   11:17 AM 12/03/2021   12:03 PM 12/01/2020    3:36 PM 12/01/2019    2:39 PM 08/24/2019   11:26 AM 08/23/2019    2:01 PM 08/20/2019   12:09 AM  Advanced Directives  Does Patient Have a Medical Advance Directive? Yes Yes Yes Yes Yes Yes No  Type of Estate agent of [place];Living will Living will;Healthcare Power of Attorney  Living will;Healthcare Power of [employer] Power of [place];Living will Living will;Healthcare Power of Attorney   Does patient want to make changes to medical advance directive? No - Patient declined No - Patient declined No - Patient declined No - Patient declined No - Patient declined No - Patient declined   Copy of Healthcare Power of Attorney in Chart? Yes - validated most recent copy scanned in chart (See row information) Yes - validated most recent copy scanned in chart (See row information)  No - copy requested No - copy requested No - copy  requested   Would patient like information on creating a medical advance directive?     No - Patient declined  No - Patient declined    Current Medications (verified) Outpatient Encounter Medications as of 12/05/2022  Medication Sig   ABRYSVO 120 MCG/0.5ML injection    acetaminophen (TYLENOL) 650 MG CR tablet Take 1,300 mg by mouth 2 (two) times daily.   b complex vitamins tablet Take 1 tablet by mouth daily.   busPIRone (BUSPAR) 5 MG tablet Take 1 tablet (5 mg total) by mouth 2 (two) times daily as needed. Annual appt due in April must see provider for future refills   Calcium-Phosphorus-Vitamin D (CITRACAL +D3 PO) Take by mouth. 600mg  +1000 units twice daily   dorzolamide-timolol (COSOPT) 22.3-6.8 MG/ML ophthalmic solution Place 1 drop into both eyes 2 (two) times daily.   estradiol (ESTRACE) 0.1 MG/GM vaginal cream Place 1 Applicatorful vaginally 2 (two) times a week.   FLUAD QUADRIVALENT 0.5 ML injection    omeprazole (PRILOSEC) 40 MG capsule Take 1 capsule (40 mg total) by mouth daily. NEEDS OFFICE VISIT FOR FURTHER REFILLS   sertraline (ZOLOFT) 100 MG tablet TAKE ONE TABLET BY MOUTH ONCE DAILY   [DISCONTINUED] hydroxychloroquine (PLAQUENIL) 200 MG tablet Take 300 mg by mouth daily.   No facility-administered encounter medications on file as of 12/05/2022.    Allergies (verified) Patient has no known allergies.   History: Past Medical History:  Diagnosis Date   ALLERGIC  RHINITIS    Allergy    ANEMIA-IRON DEFICIENCY    Anxiety state, unspecified    Carpal tunnel syndrome    pt denies    Cataract    Closed left ankle fracture    COLONIC POLYPS, HX OF 2007   GERD    GLAUCOMA    Glaucoma    GOITER, MULTINODULAR    on [place] 2006, unchanged 6/13 with nodules all <25mm   HIATAL HERNIA WITH REFLUX    Hip dysplasia, acquired    B sx; eval at [facility] for same 01/2015 -ongoing PT   OSTEOARTHRITIS    Osteoporosis    Serrated polyp of colon    TOBACCO USE, QUIT    Past Surgical  History:  Procedure Laterality Date   BREAST SURGERY  1960   Left breast cyst removed no complications   CARPAL TUNNEL RELEASE Right    COLONOSCOPY  2005   EYE SURGERY  1991-1992   both eyes   EYE SURGERY Left 2014   shunt behind left eye   ORIF FIBULA FRACTURE Left 08/24/2019   Procedure: OPEN REDUCTION INTERNAL FIXATION (ORIF) LEFT DISTAL FIBULA FRACTURE;  Surgeon: Teryl Lucy, MD;  Location: [facility];  Service: Orthopedics;  Laterality: Left;   Right shoulder aurgery  10/2008   Family History  Problem Relation Age of Onset   Diabetes Mother    Heart disease Mother    Arthritis Mother    Hypertension Mother    Arthritis Father    Colon cancer Paternal Grandmother    Esophageal cancer Neg Hx    Rectal cancer Neg Hx    Stomach cancer Neg Hx    Social History   Socioeconomic History   Marital status: Widowed    Spouse name: Not on file   Number of children: 2   Years of education: Not on file   Highest education level: Not on file  Occupational History   Not on file  Tobacco Use   Smoking status: Former    Types: Cigarettes    Quit date: 08/12/1988    Years since quitting: 34.3   Smokeless tobacco: Never  Vaping Use   Vaping Use: Never used  Substance and Sexual Activity   Alcohol use: Yes    Alcohol/week: 0.0 standard drinks of alcohol    Comment: occassionally   Drug use: No   Sexual activity: Not Currently    Birth control/protection: Post-menopausal  Other Topics Concern   Not on file  Social History Narrative   Married,widowed; lives in a 2-story home   [employer]   Social Determinants of Health   Financial Resource Strain: Low Risk  (12/05/2022)   Overall Financial Resource Strain (CARDIA)    Difficulty of Paying Living Expenses: Not very hard  Food Insecurity: No Food Insecurity (12/05/2022)   Hunger Vital Sign    Worried About Running Out of Food in the Last Year: Never true    Ran Out of Food in the Last Year: Never true   Transportation Needs: No Transportation Needs (12/05/2022)   PRAPARE - Administrator, Civil Service (Medical): No    Lack of Transportation (Non-Medical): No  Physical Activity: Sufficiently Active (12/05/2022)   Exercise Vital Sign    Days of Exercise per Week: 3 days    Minutes of Exercise per Session: 50 min  Stress: No Stress Concern Present (12/05/2022)   [employer] of Occupational Health - Occupational Stress Questionnaire    Feeling of Stress :  Only a little  Social Connections: Unknown (12/05/2022)   Social Connection and Isolation Panel [NHANES]    Frequency of Communication with Friends and Family: More than three times a week    Frequency of Social Gatherings with Friends and Family: Three times a week    Attends Religious Services: Not on file    Active Member of Clubs or Organizations: Yes    Attends Club or Organization Meetings: More than 4 times per year    Marital Status: Widowed    Tobacco Counseling Counseling given: Not Answered   Clinical Intake:  Pre-visit preparation completed: Yes  Pain : 0-10 Pain Score: 5  Pain Type: Chronic pain Pain Location: Back Pain Radiating Towards: hip     BMI - recorded: 25.79 Nutritional Status: BMI 25 -29 Overweight Nutritional Risks: None Diabetes: No  How often do you need to have someone help you when you read instructions, pamphlets, or other written materials from your doctor or pharmacy?: 1 - Never What is the last grade level you completed in school?: HSG; [employer]  Diabetic? No  Interpreter Needed?: No  Information entered by :: Trexton Escamilla N. Audi Wettstein, LPN.   Activities of Daily Living    12/05/2022   11:19 AM 12/04/2022   11:41 AM  In your present state of health, do you have any difficulty performing the following activities:  Hearing? 0 0  Vision? 0 0  Difficulty concentrating or making decisions? 0 0  Walking or climbing stairs? 0 0  Dressing or bathing? 0 0  Doing  errands, shopping? 0 0  Preparing Food and eating ? N N  Using the Toilet? N N  In the past six months, have you accidently leaked urine? N N  Do you have problems with loss of bowel control? N N  Managing your Medications? N N  Managing your Finances? N N  Housekeeping or managing your Housekeeping? N N    Patient Care Team: Myrlene Broker, MD as PCP - General (Internal Medicine) Christia Reading, MD as Consulting Physician Genia Del, MD (Obstetrics and Gynecology) Sheran Luz, MD (Physical Medicine and Rehabilitation) Bond, Doran Stabler, MD (Ophthalmology) Antony Contras, MD (Ophthalmology) Barnett Abu, MD as Consulting Physician (Neurosurgery) Pyrtle, Carie Caddy, MD as Consulting Physician (Gastroenterology) Lemmons (Dentistry) Kathyrn Sheriff, [facility] as Pharmacist (Pharmacist)  Indicate any recent Medical Services you may have received from other than [facility] providers in the past year (date may be approximate).     Assessment:   This is a routine wellness examination for [place].  Hearing/Vision screen Hearing Screening - Comments:: Patient has hearing difficulty and wears hearing aids. Vision Screening - Comments:: Patient does wear corrective lenses/contacts.  Eye exam done by: Antony Contras, MD and Reginia Naas, MD.   Dietary issues and exercise activities discussed: Current Exercise Habits: Home exercise routine;Structured exercise class, Type of exercise: Other - see comments;walking (physical therapy), Time (Minutes): 50, Frequency (Times/Week): 3, Weekly Exercise (Minutes/Week): 150, Intensity: Moderate, Exercise limited by: orthopedic condition(s)   Goals Addressed             This Visit's Progress    My goal for 2024 is to maintain my weight and continue to eat healthy and stay hydrated.        Depression Screen    12/05/2022   11:16 AM 10/04/2022   10:40 AM 08/08/2022    9:08 AM 01/02/2022    3:37 PM 12/03/2021   11:47 AM 11/26/2021     1:22 PM 04/27/2021  3:07 PM  PHQ 2/9 Scores  PHQ - 2 Score 0 0 0 0 0 0 0  PHQ- 9 Score 0 0   0 0 0    Fall Risk    12/05/2022   11:18 AM 12/04/2022   11:41 AM 10/04/2022   10:40 AM 08/08/2022    9:08 AM 01/02/2022    3:38 PM  Fall Risk   Falls in the past year? 0 0 0 0 0  Number falls in past yr: 0 0 0 0 0  Injury with Fall? 0 0 0 0 0  Risk for fall due to : No Fall Risks   No Fall Risks No Fall Risks  Follow up Falls prevention discussed  Falls evaluation completed Falls evaluation completed Falls evaluation completed    FALL RISK PREVENTION PERTAINING TO THE HOME:  Any stairs in or around the home? Yes  If so, are there any without handrails? No  Home free of loose throw rugs in walkways, pet beds, electrical cords, etc? Yes  Adequate lighting in your home to reduce risk of falls? Yes   ASSISTIVE DEVICES UTILIZED TO PREVENT FALLS:  Life alert? No  Use of a cane, walker or w/c? No  Grab bars in the bathroom? Yes  Shower chair or bench in shower? Yes  Elevated toilet seat or a handicapped toilet? Yes   TIMED UP AND GO:  Was the test performed? No . Telephonic Visit  Cognitive Function:        12/05/2022   11:20 AM 12/03/2021   11:58 AM 12/01/2019    2:48 PM  6CIT Screen  What Year? 0 points 0 points 0 points  What month? 0 points 0 points 0 points  What time? 0 points 0 points 0 points  Count back from 20 0 points 0 points 0 points  Months in reverse 0 points 0 points 0 points  Repeat phrase 0 points 0 points 0 points  Total Score 0 points 0 points 0 points    Immunizations Immunization History  Administered Date(s) Administered   Fluad Quad(high Dose 65+) 06/02/2021   Influenza Split 05/12/2012   Influenza Whole 05/12/2009, 05/29/2010   Influenza, High Dose Seasonal PF 06/12/2013, 06/05/2016, 05/27/2018, 07/17/2020, 06/06/2022   Influenza-Unspecified 05/12/2014, 05/27/2015, 05/12/2017, 05/28/2018   PFIZER Comirnaty(Gray Top)Covid-19 Tri-Sucrose Vaccine  10/05/2019, 10/26/2019, 05/22/2020   Pneumococcal Conjugate-13 08/10/2013   Pneumococcal Polysaccharide-23 07/12/2011   Td 01/13/2004   Tdap 02/23/2014   Zoster Recombinat (Shingrix) 03/01/2019, 06/01/2019   Zoster, Live 01/27/2008, 05/12/2009    TDAP status: Up to date  Flu Vaccine status: Up to date  Pneumococcal vaccine status: Up to date  Covid-19 vaccine status: Completed vaccines  Qualifies for Shingles Vaccine? Yes   Zostavax completed Yes   Shingrix Completed?: Yes  Screening Tests Health Maintenance  Topic Date Due   COVID-19 Vaccine (4 - 2023-24 season) 04/12/2022   INFLUENZA VACCINE  03/13/2023   Medicare Annual Wellness (AWV)  12/05/2023   DTaP/Tdap/Td (3 - Td or Tdap) 02/24/2024   Pneumonia Vaccine 8+ Years old  Completed   DEXA SCAN  Completed   Zoster Vaccines- Shingrix  Completed   HPV VACCINES  Aged Out   COLONOSCOPY (Pts 45-52yrs Insurance coverage will need to be confirmed)  Discontinued    Health Maintenance  Health Maintenance Due  Topic Date Due   COVID-19 Vaccine (4 - 2023-24 season) 04/12/2022    Colorectal cancer screening: No longer required.   Mammogram status: Completed 04/11/2022. Repeat every year  Bone Density status: Completed 04/23/2021. Results reflect: Bone density results: OSTEOPENIA. Repeat every 2-3 years.  Lung Cancer Screening: (Low Dose CT Chest recommended if Age 80-80 years, 30 pack-year currently smoking OR have quit w/in 15years.) does not qualify.   Lung Cancer Screening Referral: No  Additional Screening:  Hepatitis C Screening: does not qualify; Completed: No  Vision Screening: Recommended annual ophthalmology exams for early detection of glaucoma and other disorders of the eye. Is the patient up to date with their annual eye exam?  Yes  Who is the provider or what is the name of the office in which the patient attends annual eye exams? Antony Contras, MD and Reginia Naas, MD. If pt is not established with a  provider, would they like to be referred to a provider to establish care? No .   Dental Screening: Recommended annual dental exams for proper oral hygiene  Community Resource Referral / Chronic Care Management: CRR required this visit?  No   CCM required this visit?  No      Plan:     I have personally reviewed and noted the following in the patient's chart:   Medical and social history Use of alcohol, tobacco or illicit drugs  Current medications and supplements including opioid prescriptions. Patient is not currently taking opioid prescriptions. Functional ability and status Nutritional status Physical activity Advanced directives List of other physicians Hospitalizations, surgeries, and ER visits in previous 12 months Vitals Screenings to include cognitive, depression, and falls Referrals and appointments  In addition, I have reviewed and discussed with patient certain preventive protocols, quality metrics, and best practice recommendations. A written personalized care plan for preventive services as well as general preventive health recommendations were provided to patient.     Mickeal Needy, LPN   1/61/0960   Nurse Notes:  Patient is cogitatively intact. There were no vitals filed for this visit. Patient stated that she has no issues with gait or balance; does not use any assistive devices. Medications reviewed with patient; no opioid use noted.   Medical screening examination/treatment/procedure(s) were performed by non-physician practitioner and as supervising physician I was immediately available for consultation/collaboration.  I agree with above. Jacinta Shoe, MD

## 2022-12-06 ENCOUNTER — Ambulatory Visit (INDEPENDENT_AMBULATORY_CARE_PROVIDER_SITE_OTHER): Payer: Medicare HMO | Admitting: Internal Medicine

## 2022-12-06 ENCOUNTER — Encounter: Payer: Self-pay | Admitting: Internal Medicine

## 2022-12-06 ENCOUNTER — Ambulatory Visit: Payer: Medicare HMO

## 2022-12-06 VITALS — BP 168/70 | HR 65 | Temp 98.0°F | Ht 62.0 in | Wt 138.0 lb

## 2022-12-06 DIAGNOSIS — Z0000 Encounter for general adult medical examination without abnormal findings: Secondary | ICD-10-CM

## 2022-12-06 DIAGNOSIS — R5383 Other fatigue: Secondary | ICD-10-CM | POA: Diagnosis not present

## 2022-12-06 DIAGNOSIS — M48061 Spinal stenosis, lumbar region without neurogenic claudication: Secondary | ICD-10-CM | POA: Diagnosis not present

## 2022-12-06 DIAGNOSIS — N8110 Cystocele, unspecified: Secondary | ICD-10-CM | POA: Diagnosis not present

## 2022-12-06 DIAGNOSIS — F3342 Major depressive disorder, recurrent, in full remission: Secondary | ICD-10-CM

## 2022-12-06 DIAGNOSIS — F419 Anxiety disorder, unspecified: Secondary | ICD-10-CM

## 2022-12-06 DIAGNOSIS — K219 Gastro-esophageal reflux disease without esophagitis: Secondary | ICD-10-CM

## 2022-12-06 DIAGNOSIS — R7303 Prediabetes: Secondary | ICD-10-CM | POA: Diagnosis not present

## 2022-12-06 LAB — COMPREHENSIVE METABOLIC PANEL
ALT: 15 U/L (ref 0–35)
AST: 23 U/L (ref 0–37)
Albumin: 4.7 g/dL (ref 3.5–5.2)
Alkaline Phosphatase: 50 U/L (ref 39–117)
BUN: 14 mg/dL (ref 6–23)
CO2: 27 mEq/L (ref 19–32)
Calcium: 9.5 mg/dL (ref 8.4–10.5)
Chloride: 101 mEq/L (ref 96–112)
Creatinine, Ser: 0.83 mg/dL (ref 0.40–1.20)
GFR: 65.71 mL/min (ref >60.00)
Glucose, Bld: 92 mg/dL (ref 70–99)
Potassium: 4.5 mEq/L (ref 3.5–5.1)
Sodium: 137 mEq/L (ref 135–145)
Total Bilirubin: 0.5 mg/dL (ref 0.2–1.2)
Total Protein: 7.3 g/dL (ref 6.0–8.3)

## 2022-12-06 LAB — URINALYSIS, ROUTINE W REFLEX MICROSCOPIC
Bilirubin Urine: NEGATIVE
Hgb urine dipstick: NEGATIVE
Ketones, ur: NEGATIVE
Nitrite: NEGATIVE
Specific Gravity, Urine: 1.01 (ref 1.000–1.030)
Total Protein, Urine: NEGATIVE
Urine Glucose: NEGATIVE
Urobilinogen, UA: 0.2 (ref 0.0–1.0)
pH: 7 (ref 5.0–8.0)

## 2022-12-06 LAB — LIPID PANEL
Cholesterol: 196 mg/dL (ref 0–200)
HDL: 72.4 mg/dL (ref >39.00)
LDL Cholesterol: 105 mg/dL — ABNORMAL HIGH (ref 0–99)
NonHDL: 123.66
Total CHOL/HDL Ratio: 3
Triglycerides: 93 mg/dL (ref 0.0–149.0)
VLDL: 18.6 mg/dL (ref 0.0–40.0)

## 2022-12-06 LAB — CBC
HCT: 40.4 % (ref 36.0–46.0)
Hemoglobin: 13.5 g/dL (ref 12.0–15.0)
MCHC: 33.5 g/dL (ref 30.0–36.0)
MCV: 90.1 fl (ref 78.0–100.0)
Platelets: 171 10*3/uL (ref 150.0–400.0)
RBC: 4.48 Mil/uL (ref 3.87–5.11)
RDW: 14.4 % (ref 11.5–15.5)
WBC: 6.7 10*3/uL (ref 4.0–10.5)

## 2022-12-06 LAB — TSH: TSH: 1.4 u[IU]/mL (ref 0.35–5.50)

## 2022-12-06 LAB — VITAMIN B12: Vitamin B-12: 589 pg/mL (ref 211–911)

## 2022-12-06 LAB — VITAMIN D 25 HYDROXY (VIT D DEFICIENCY, FRACTURES): VITD: 45.91 ng/mL (ref 30.00–100.00)

## 2022-12-06 LAB — HEMOGLOBIN A1C: Hgb A1c MFr Bld: 5.8 % (ref 4.6–6.5)

## 2022-12-06 MED ORDER — OMEPRAZOLE 40 MG PO CPDR: 40.0000 mg | DELAYED_RELEASE_CAPSULE | Freq: Every day | ORAL | 3 refills | Status: DC

## 2022-12-06 NOTE — Assessment & Plan Note (Signed)
Overall well controlled on buspar 5 mg BID and zoloft 100 mg daily. Continue and refill as needed.

## 2022-12-06 NOTE — Progress Notes (Signed)
   Subjective:   Patient ID: Linda [name], female    DOB: [birth year], 82 y.o.   MRN: [id number]  HPI The patient is here for physical.  PMH, [facility], social history reviewed and updated  Review of Systems  Constitutional: Negative.   HENT: Negative.    Eyes: Negative.   Respiratory:  Negative for cough, chest tightness and shortness of breath.   Cardiovascular:  Negative for chest pain, palpitations and leg swelling.  Gastrointestinal:  Negative for abdominal distention, abdominal pain, constipation, diarrhea, nausea and vomiting.  Musculoskeletal:  Positive for arthralgias.  Skin: Negative.   Neurological: Negative.   Psychiatric/Behavioral: Negative.      Objective:  Physical Exam Constitutional:      Appearance: She is well-developed.  HENT:     Head: Normocephalic and atraumatic.  Cardiovascular:     Rate and Rhythm: Normal rate and regular rhythm.  Pulmonary:     Effort: Pulmonary effort is normal. No respiratory distress.     Breath sounds: Normal breath sounds. No wheezing or rales.  Abdominal:     General: Bowel sounds are normal. There is no distension.     Palpations: Abdomen is soft.     Tenderness: There is no abdominal tenderness. There is no rebound.  Musculoskeletal:     Cervical back: Normal range of motion.  Skin:    General: Skin is warm and dry.  Neurological:     Mental Status: She is alert and oriented to person, place, and time.     Coordination: Coordination normal.     Vitals:   12/06/22 1016  BP: (!) 152/78  Pulse: 67  Temp: 98 F (36.7 C)  TempSrc: Oral  SpO2: 96%  Weight: 138 lb (62.6 kg)  Height: 5\' 2"  (1.575 m)    Assessment & Plan:

## 2022-12-06 NOTE — Assessment & Plan Note (Signed)
Checking TSH and B12 and vitamin D. Treat as appropriate.

## 2022-12-06 NOTE — Assessment & Plan Note (Signed)
Working with PT and will continue exercises.

## 2022-12-06 NOTE — Assessment & Plan Note (Signed)
Flu shot yearly. Pneumonia complete. Shingrix complete. Tetanus due 2025. Colonoscopy aged out. Mammogram aged out, pap smear aged out and dexa due 2027. Counseled about sun safety and mole surveillance. Counseled about the dangers of distracted driving. Given 10 year screening recommendations.

## 2022-12-06 NOTE — Patient Instructions (Signed)
Try zyrtec or claritin for the drainage in the throat.

## 2022-12-06 NOTE — Assessment & Plan Note (Signed)
Well controlled with omeprazole 40 mg daily. Refill as needed.

## 2022-12-06 NOTE — Assessment & Plan Note (Signed)
Checking U/A today. °

## 2022-12-06 NOTE — Assessment & Plan Note (Signed)
Using buspar and zoloft and well controlled overall.

## 2022-12-06 NOTE — Assessment & Plan Note (Signed)
Checking HgA1c and adjust as needed.  

## 2022-12-11 ENCOUNTER — Encounter: Payer: Self-pay | Admitting: Physical Therapy

## 2022-12-11 ENCOUNTER — Ambulatory Visit: Payer: Medicare HMO | Attending: Internal Medicine | Admitting: Physical Therapy

## 2022-12-11 ENCOUNTER — Ambulatory Visit: Payer: Medicare HMO | Admitting: Physical Therapy

## 2022-12-11 DIAGNOSIS — M6281 Muscle weakness (generalized): Secondary | ICD-10-CM | POA: Diagnosis not present

## 2022-12-11 DIAGNOSIS — M545 Low back pain, unspecified: Secondary | ICD-10-CM | POA: Insufficient documentation

## 2022-12-11 DIAGNOSIS — R252 Cramp and spasm: Secondary | ICD-10-CM | POA: Insufficient documentation

## 2022-12-11 DIAGNOSIS — R262 Difficulty in walking, not elsewhere classified: Secondary | ICD-10-CM | POA: Insufficient documentation

## 2022-12-11 DIAGNOSIS — G8929 Other chronic pain: Secondary | ICD-10-CM | POA: Insufficient documentation

## 2022-12-11 DIAGNOSIS — R293 Abnormal posture: Secondary | ICD-10-CM | POA: Diagnosis not present

## 2022-12-11 NOTE — Therapy (Signed)
OUTPATIENT PHYSICAL THERAPY THORACOLUMBAR TREATMENT   Patient Name: Linda [name] MRN: [id number] DOB:March 14, 1941, 82 y.o., female Today's Date: 12/11/2022  END OF SESSION:  PT End of Session - 12/11/22 1040     Visit Number 8    Date for PT Re-Evaluation 02/05/23    Authorization Type [employer]    Progress Note Due on Visit 10    PT Start Time 1018    PT Stop Time 1058    PT Time Calculation (min) 40 min    Activity Tolerance Patient tolerated treatment well    Behavior During Therapy WFL for tasks assessed/performed                  Past Medical History:  Diagnosis Date   ALLERGIC RHINITIS    Allergy    ANEMIA-IRON DEFICIENCY    Anxiety state, unspecified    Carpal tunnel syndrome    pt denies    Cataract    Closed left ankle fracture    COLONIC POLYPS, HX OF 2007   GERD    GLAUCOMA    Glaucoma    GOITER, MULTINODULAR    on [place] 2006, unchanged 6/13 with nodules all <28mm   HIATAL HERNIA WITH REFLUX    Hip dysplasia, acquired    B sx; eval at [facility] for same 01/2015 -ongoing PT   OSTEOARTHRITIS    Osteoporosis    Serrated polyp of colon    TOBACCO USE, QUIT    Past Surgical History:  Procedure Laterality Date   BREAST SURGERY  1960   Left breast cyst removed no complications   CARPAL TUNNEL RELEASE Right    COLONOSCOPY  2005   EYE SURGERY  1991-1992   both eyes   EYE SURGERY Left 2014   shunt behind left eye   ORIF FIBULA FRACTURE Left 08/24/2019   Procedure: OPEN REDUCTION INTERNAL FIXATION (ORIF) LEFT DISTAL FIBULA FRACTURE;  Surgeon: Teryl Lucy, MD;  Location: [name];  Service: Orthopedics;  Laterality: Left;   Right shoulder aurgery  10/2008   Patient Active Problem List   Diagnosis Date Noted   Spinal stenosis of lumbar region 10/09/2022   Facet arthritis of lumbar region 10/09/2022   Chronic bilateral low back pain without sciatica 10/04/2022   Osteopenia 11/26/2021   Pre-diabetes 11/26/2021   Other fatigue 07/28/2020    Dupuytren's disease of palm 01/19/2018   Bilateral sensorineural hearing loss 03/12/2016   Routine general medical examination at a health care facility 07/22/2015   Osteoarthritis of both hips resulting from hip dysplasia 01/25/2015   Primary osteoarthritis of right hip 01/25/2015   Anemia 11/06/2012   Anxiety 11/06/2012   Depression 11/06/2012   Cataract, nuclear 10/28/2012   Primary open angle glaucoma of both eyes, indeterminate stage 10/28/2012   Ptosis of eyelid 10/28/2012   Bladder prolapse, female, acquired 01/09/2012   GERD (gastroesophageal reflux disease) 05/21/2007   Osteoarthritis 05/21/2007    PCP: Myrlene Broker, MD  REFERRING PROVIDER: Myrlene Broker, MD  REFERRING DIAG: M54.50,G89.29 (ICD-10-CM) - Chronic bilateral low back pain without sciatic  Rationale for Evaluation and Treatment: Rehabilitation  THERAPY DIAG:  Chronic bilateral low back pain without sciatica  Abnormal posture  Muscle weakness (generalized)  Difficulty in walking, not elsewhere classified  Cramp and spasm  ONSET DATE: 20+ years ago but recently worsened  SUBJECTIVE:  SUBJECTIVE STATEMENT: I had a good work out in the water yesterday.   I feel good about diong the strength on my own but I am still making progress with PT for pain management.  PERTINENT HISTORY:  Osteopenia, scoliosis, Hx of Lt ankle fracture with hardware  PAIN:  PAIN:  Are you having pain? Yes NPRS scale: 6-7/10 Pain location: midline LBP into bil buttocks Pain orientation: Bilateral, Medial, and Lower  PAIN TYPE: aching, dull, and tight Pain description: constant  Aggravating factors: prolonged standing and walking up to 1 hour Relieving factors: sitting provides immediate relief but hips get  tight   PRECAUTIONS: Other: osteopenia  WEIGHT BEARING RESTRICTIONS: No  FALLS:  Has patient fallen in last 6 months? No  LIVING ENVIRONMENT: Lives with: lives alone Lives in: House/apartment Stairs: Yes: Internal: 16 steps; can reach both Has following equipment at home: Single point cane for longer distance community outings  OCCUPATION: retired  PLOF: Independent  PATIENT GOALS: improve upright posture, strengthen core, return to walking for exercise 2-3x/week  NEXT MD VISIT: about 6 weeks, April, after PT  OBJECTIVE:   DIAGNOSTIC FINDINGS:  XRAY: Degenerative scoliosis convex left concave right. Multilevel DDD. Significant facet arthritis at L3-4 L4-5 and L5-S1  MRI 2018: IMPRESSION: Progression of spondylosis at L3-4 where there is moderate to moderately severe central canal and bilateral subarticular recess stenosis. Severe right foraminal narrowing is also seen at this level.   No notable change in narrowing of the right subarticular recess and foramen at L2-3 due to facet arthropathy and disc.   No change in narrowing of the left subarticular recess at L4-5 mainly due to facet arthropathy. There is moderate central canal stenosis overall at L4-5. No nerve root compression is identified.   Moderate left foraminal stenosis at L5-S1 appears slightly worse on the prior examination.   Severe convex left scoliosis with the apex at L3.  PATIENT SURVEYS:  Modified Oswestry 16%, moderate disability category   SCREENING FOR RED FLAGS: Bowel or bladder incontinence: No Spinal tumors: No Cauda equina syndrome: No Compression fracture: No Abdominal aneurysm: No  COGNITION: Overall cognitive status: Within functional limits for tasks assessed     SENSATION: WFL - has some "dead places" in Lt foot following fracture/hardware placement  MUSCLE LENGTH: Hamstring WNL bil Limited gluteals and piriformis bil with end range tightness Limited hip flexors bil  50%  POSTURE:  scoliosis present  PALPATION: Tender along bil lumbar spine and adjacent muscles, tender gluteals and piriformis bil  LUMBAR ROM:   AROM eval  Flexion Fingers to ankles, limited lumbar segmental reversal  Extension NT  Right lateral flexion 3, pain  Left lateral flexion 10  Right rotation 75%  Left rotation 50%, pain   (Blank rows = not tested)  LOWER EXTREMITY ROM:     WFL bil  LOWER EXTREMITY MMT:   Bil hips 3+/5 Bil knees 4/5  FUNCTIONAL TESTS:  Unable to balance in SLS on either LE, able to perform half squat, cannot march in place secondary to balance 10/31/22: 3 min walk test: 516.6 feet with back pain 4/10  GAIT: Distance walked: within clinic Assistive device utilized: None Level of assistance: Complete Independence, uses SPC for longer distances Comments: short stride length bil  TODAY'S TREATMENT:  DATE: 12/11/22 Recumbent bike L2 x 5' (Pt doesn't like NuStep), PT present to discuss status Blue pball roll outs Hamstring stretch at vibration plate 1 min bil Hip flexor stretch with movement at vibration plate 1 min bil Manual Rt gluteals, TFL, lumbar paraspinals Trigger Point Dry-Needling  Treatment instructions: Expect mild to moderate muscle soreness. S/S of pneumothorax if dry needled over a lung field, and to seek immediate medical attention should they occur. Patient verbalized understanding of these instructions and education.  Patient Consent Given: Yes Education handout provided: Previously provided Muscles treated: lumbar, gluteal, TFL Rt side, Rt lumbar multifidi Electrical stimulation performed: No Parameters: N/A Treatment response/outcome: increased soft tissue length   12/04/22 Recumbent bike L2 x 5' (Pt doesn't like NuStep), PT present to discuss status Recommend thick yoga mat for floor routine to soften  surface Supine hip flexor stretch off edge of table x60' bil Supine PPT x 10 Leg lengthener from spinal decompression series 3x5" each LE Fig 4 push away/pull across bil 3x20" Trial of elliptical x 2' Standing L counter stretch Counter plank x 30" Discussion about goals of PT (mobility, flexibility, lengthening, decompression)  DATE: 11/27/22 Recumbent bike L2 x 5' (Pt doesn't like NuStep), PT present to discuss status Bil piriformis stretch edge of table table 2x30" (HEP) Hip flexor stretch on stairs and in doorway with UE reach up and across (HEP for doorway) Hip flexor stretch Rt LE off edge of table (HEP) Trigger Point Dry-Needling  Treatment instructions: Expect mild to moderate muscle soreness. S/S of pneumothorax if dry needled over a lung field, and to seek immediate medical attention should they occur. Patient verbalized understanding of these instructions and education.  Patient Consent Given: Yes Education handout provided: Previously provided Muscles treated: Rt TFL, glut min, glut med, piriformis Electrical stimulation performed: Yes Parameters: N/A Treatment response/outcome: more twitches in TFL and glut min than more posterior muscles STM Rt hip after DN, aftercare reminders for heat    DATE: 11/20/22 Recumbent bike L2 x 5' (Pt doesn't like NuStep), PT present to discuss status Supine lower trunk rotation x10, hold 10 sec final rep SKTC 3x10" each, then DKTC 1x10" Supine piriformis stretch 2x30" bil Supine PPT 10x5" SLR 1x10 bil 2' walk around both gyms Trigger Point Dry-Needling  Treatment instructions: Expect mild to moderate muscle soreness. S/S of pneumothorax if dry needled over a lung field, and to seek immediate medical attention should they occur. Patient verbalized understanding of these instructions and education.  Patient Consent Given: Yes Education handout provided: Previously provided Muscles treated: bil lumbar multifidi Electrical stimulation  performed: No Parameters: N/A Treatment response/outcome: twitch and release STM bil lumbar and thoracic paraspinals, bil hips in prone  DATE: 11/13/22  Recumbent bike L3/L2 x 5' (Pt doesn't like NuStep), PT present to discuss status Discussion about [employer] membership Review of spinal decompression series and reason for doing so, timing should be afternoon to gain more evening hours with less pain Review of HEP supine aspects: PPT, 90/90 heel taps (changed to SLR), TA indraw, lower trunk rotation Trigger Point Dry-Needling  Treatment instructions: Expect mild to moderate muscle soreness. S/S of pneumothorax if dry needled over a lung field, and to seek immediate medical attention should they occur. Patient verbalized understanding of these instructions and education.  Patient Consent Given: Yes Education handout provided: Previously provided Muscles treated: bil lumbar multifidi Electrical stimulation performed: No Parameters: N/A Treatment response/outcome: release and elongation along spine bil   PATIENT EDUCATION:  Education details: Access Code:  [email or web address] Person educated: Patient Education method: Explanation, Demonstration, Verbal cues, and Handouts Education comprehension: verbalized understanding and returned demonstration  HOME EXERCISE PROGRAM: Access Code: [email or web address] URL: [email or web address] Date: 11/27/2022 Prepared by: Loistine Simas Beuhring  Exercises - Supine Posterior Pelvic Tilt  - 1 x daily - 7 x weekly - 1 sets - 10 reps - 5-10 sec hold - Supine Lower Trunk Rotation  - 1 x daily - 7 x weekly - 1 sets - 10 reps - Supine Double Knee to Chest  - 1 x daily - 7 x weekly - 1 sets - 3 reps - 20-30 sec hold - Supine Piriformis Stretch with Foot on Ground  - 1 x daily - 7 x weekly - 1 sets - 3 reps - 20-30 sec hold - Standing Hamstring Stretch on Chair  - 1 x daily - 7 x weekly - 1 sets - 3 reps - 30 sec hold - Quadricep Stretch with Chair and Counter Support   - 1 x daily - 7 x weekly - 1 sets - 3 reps - 30 sec hold - Supine Straight Leg Raise  - 1 x daily - 7 x weekly - 1 sets - 10 reps - Seated Table Piriformis Stretch  - 1 x daily - 7 x weekly - 1 sets - 3 reps - 30 sec hold - Hip Flexor Stretch at Edge of Bed  - 1 x daily - 7 x weekly - 1 sets - 3 reps - 30-60 sec hold - Doorway Hip Flexor Stretch with Chair  - 1 x daily - 7 x weekly - 1 sets - 3 reps - 30 sec hold  ASSESSMENT:  CLINICAL IMPRESSION: PT session focused on decompression and soft tissue techniques to continue to address pain management as pt has been making good progress, but still not achieved functional goals.  Pt continues to be very stiff and limited by pain for time on her feet and needs daily stretching and decompression to manage.  PT recommended extension as pt is expected to improve and maximize mobility and ind with light strength component of PT.  OBJECTIVE IMPAIRMENTS: Abnormal gait, decreased activity tolerance, decreased balance, decreased mobility, decreased ROM, hypomobility, impaired flexibility, improper body mechanics, postural dysfunction, and pain.   ACTIVITY LIMITATIONS: carrying, lifting, bending, standing, squatting, stairs, and locomotion level  PARTICIPATION LIMITATIONS: meal prep, cleaning, laundry, shopping, and community activity  PERSONAL FACTORS: Age, Time since onset of injury/illness/exacerbation, and 1 comorbidity: scoliosis, osteopenia  are also affecting patient's functional outcome.   REHAB POTENTIAL: Excellent  CLINICAL DECISION MAKING: Stable/uncomplicated  EVALUATION COMPLEXITY: Low   GOALS: Goals reviewed with patient? Yes  SHORT TERM GOALS: Target date: 11/18/22  Pt will be ind with initial HEP and spinal decompression 1-2x/day. Baseline: Goal status: met 4/17  2.  Pt will return to walking at park with seated breaks (benches) 1-2x/week for 10+ min. Baseline:  Goal status: 10 min, but increased hip pain bil, met 4/17    LONG  TERM GOALS: Target date: 02/05/23 Updated 12/11/22  Pt will be ind with advanced HEP for postural strength, LE functional strength, core strength and flexibility to improve overall activity tolerance. Baseline:  Goal status: IN PROGRESS  2.  Pt will be able to perform standing and walking activity for up to 1 hour with pain not to exceed 4/10. Baseline: haven't done it because of time, walking feels not as bad as it was but gets up to 5-6/10 Goal status: IN PROGRESS  3.  Pt will  be able to walk at park for up to 20 min with seated breaks as needed (benches) 2 days a week. Baseline:  Goal status: DEFERRED per patient 4/24  4.  Pt will improve LE strength to at least 4+/5 for improved gait, stairs and squatting for housework. Baseline: stairs feel fine currently Goal status: IN PROGRESS  5.  Pt will improve ODI score by at least 4 points to reduce score into mild disability category from mod disability. Baseline: 8/50, 16%; 9/50 on 12/11/22  Goal status: IN PROGRESS   PLAN:  PT FREQUENCY: 1x/week  PT DURATION: 8 weeks  PLANNED INTERVENTIONS: Therapeutic exercises, Therapeutic activity, Neuromuscular re-education, Balance training, Gait training, Patient/Family education, Self Care, Joint mobilization, Aquatic Therapy, Dry Needling, Electrical stimulation, Spinal mobilization, Cryotherapy, Moist heat, Taping, and Manual therapy.  PLAN FOR NEXT SESSION:  focus on stretching, mobility, decompression,  update LTG and do ODI, bike (not Nustep), progress supine and/or standing tbands for UE postural strength, core stabilization, DN to glutes and piriformis  Russella Dar, PT, DPT 12/11/22 7:55 PM       [facility] [address] [place], [place] [address] Phone # [phone] Fax [phone]

## 2022-12-16 DIAGNOSIS — Z961 Presence of intraocular lens: Secondary | ICD-10-CM | POA: Diagnosis not present

## 2022-12-16 DIAGNOSIS — H401132 Primary open-angle glaucoma, bilateral, moderate stage: Secondary | ICD-10-CM | POA: Diagnosis not present

## 2022-12-25 ENCOUNTER — Ambulatory Visit: Payer: Medicare HMO | Admitting: Physical Therapy

## 2023-01-01 ENCOUNTER — Ambulatory Visit: Payer: Medicare HMO | Admitting: Physical Therapy

## 2023-01-01 ENCOUNTER — Encounter: Payer: Self-pay | Admitting: Physical Therapy

## 2023-01-01 DIAGNOSIS — R293 Abnormal posture: Secondary | ICD-10-CM | POA: Diagnosis not present

## 2023-01-01 DIAGNOSIS — R262 Difficulty in walking, not elsewhere classified: Secondary | ICD-10-CM | POA: Diagnosis not present

## 2023-01-01 DIAGNOSIS — M545 Low back pain, unspecified: Secondary | ICD-10-CM | POA: Diagnosis not present

## 2023-01-01 DIAGNOSIS — M6281 Muscle weakness (generalized): Secondary | ICD-10-CM | POA: Diagnosis not present

## 2023-01-01 DIAGNOSIS — R252 Cramp and spasm: Secondary | ICD-10-CM

## 2023-01-01 DIAGNOSIS — G8929 Other chronic pain: Secondary | ICD-10-CM | POA: Diagnosis not present

## 2023-01-01 NOTE — Therapy (Signed)
OUTPATIENT PHYSICAL THERAPY THORACOLUMBAR TREATMENT   Patient Name: Linda [name] MRN: [id number] DOB:Feb 20, 1941, 82 y.o., female Today's Date: 01/01/2023  END OF SESSION:  PT End of Session - 01/01/23 1108     Visit Number 9    Date for PT Re-Evaluation 02/05/23    Authorization Type [employer] approved 10 more visits through 6/26    Authorization - Number of Visits 9    Progress Note Due on Visit 18    PT Start Time 1103    PT Stop Time 1143    PT Time Calculation (min) 40 min    Activity Tolerance Patient tolerated treatment well    Behavior During Therapy WFL for tasks assessed/performed                   Past Medical History:  Diagnosis Date   ALLERGIC RHINITIS    Allergy    ANEMIA-IRON DEFICIENCY    Anxiety state, unspecified    Carpal tunnel syndrome    pt denies    Cataract    Closed left ankle fracture    COLONIC POLYPS, HX OF 2007   GERD    GLAUCOMA    Glaucoma    GOITER, MULTINODULAR    on [place] 2006, unchanged 6/13 with nodules all <78mm   HIATAL HERNIA WITH REFLUX    Hip dysplasia, acquired    B sx; eval at [facility] for same 01/2015 -ongoing PT   OSTEOARTHRITIS    Osteoporosis    Serrated polyp of colon    TOBACCO USE, QUIT    Past Surgical History:  Procedure Laterality Date   BREAST SURGERY  1960   Left breast cyst removed no complications   CARPAL TUNNEL RELEASE Right    COLONOSCOPY  2005   EYE SURGERY  1991-1992   both eyes   EYE SURGERY Left 2014   shunt behind left eye   ORIF FIBULA FRACTURE Left 08/24/2019   Procedure: OPEN REDUCTION INTERNAL FIXATION (ORIF) LEFT DISTAL FIBULA FRACTURE;  Surgeon: Teryl Lucy, MD;  Location: [facility];  Service: Orthopedics;  Laterality: Left;   Right shoulder aurgery  10/2008   Patient Active Problem List   Diagnosis Date Noted   Spinal stenosis of lumbar region 10/09/2022   Facet arthritis of lumbar region 10/09/2022   Chronic bilateral low back pain without sciatica 10/04/2022    Osteopenia 11/26/2021   Pre-diabetes 11/26/2021   Other fatigue 07/28/2020   Dupuytren's disease of palm 01/19/2018   Bilateral sensorineural hearing loss 03/12/2016   Routine general medical examination at a health care facility 07/22/2015   Osteoarthritis of both hips resulting from hip dysplasia 01/25/2015   Primary osteoarthritis of right hip 01/25/2015   Anemia 11/06/2012   Anxiety 11/06/2012   Depression 11/06/2012   Cataract, nuclear 10/28/2012   Primary open angle glaucoma of both eyes, indeterminate stage 10/28/2012   Ptosis of eyelid 10/28/2012   Bladder prolapse, female, acquired 01/09/2012   GERD (gastroesophageal reflux disease) 05/21/2007   Osteoarthritis 05/21/2007    PCP: Myrlene Broker, MD  REFERRING PROVIDER: Myrlene Broker, MD  REFERRING DIAG: M54.50,G89.29 (ICD-10-CM) - Chronic bilateral low back pain without sciatic  Rationale for Evaluation and Treatment: Rehabilitation  THERAPY DIAG:  Chronic bilateral low back pain without sciatica  Abnormal posture  Muscle weakness (generalized)  Difficulty in walking, not elsewhere classified  Cramp and spasm  ONSET DATE: 20+ years ago but recently worsened  SUBJECTIVE:  SUBJECTIVE STATEMENT: I overdid it doing all the chores around the house and hurrying around and it took 2 weeks of rest to recover.  The Rt hip was so bad I almost went to orthopedist.    PERTINENT HISTORY:  Osteopenia, scoliosis, Hx of Lt ankle fracture with hardware  PAIN:  PAIN:  Are you having pain? Yes NPRS scale: 4/10 Pain location: midline LBP into bil buttocks Pain orientation: Bilateral, Medial, and Lower  PAIN TYPE: aching, dull, and tight Pain description: constant  Aggravating factors: prolonged standing and walking up to 1  hour Relieving factors: sitting provides immediate relief but hips get tight   PRECAUTIONS: Other: osteopenia  WEIGHT BEARING RESTRICTIONS: No  FALLS:  Has patient fallen in last 6 months? No  LIVING ENVIRONMENT: Lives with: lives alone Lives in: House/apartment Stairs: Yes: Internal: 16 steps; can reach both Has following equipment at home: Single point cane for longer distance community outings  OCCUPATION: retired  PLOF: Independent  PATIENT GOALS: improve upright posture, strengthen core, return to walking for exercise 2-3x/week  NEXT MD VISIT: about 6 weeks, April, after PT  OBJECTIVE:   DIAGNOSTIC FINDINGS:  XRAY: Degenerative scoliosis convex left concave right. Multilevel DDD. Significant facet arthritis at L3-4 L4-5 and L5-S1  MRI 2018: IMPRESSION: Progression of spondylosis at L3-4 where there is moderate to moderately severe central canal and bilateral subarticular recess stenosis. Severe right foraminal narrowing is also seen at this level.   No notable change in narrowing of the right subarticular recess and foramen at L2-3 due to facet arthropathy and disc.   No change in narrowing of the left subarticular recess at L4-5 mainly due to facet arthropathy. There is moderate central canal stenosis overall at L4-5. No nerve root compression is identified.   Moderate left foraminal stenosis at L5-S1 appears slightly worse on the prior examination.   Severe convex left scoliosis with the apex at L3.  PATIENT SURVEYS:  Modified Oswestry 16%, moderate disability category   SCREENING FOR RED FLAGS: Bowel or bladder incontinence: No Spinal tumors: No Cauda equina syndrome: No Compression fracture: No Abdominal aneurysm: No  COGNITION: Overall cognitive status: Within functional limits for tasks assessed     SENSATION: WFL - has some "dead places" in Lt foot following fracture/hardware placement  MUSCLE LENGTH: Hamstring WNL bil Limited gluteals  and piriformis bil with end range tightness Limited hip flexors bil 50%  POSTURE:  scoliosis present  PALPATION: Tender along bil lumbar spine and adjacent muscles, tender gluteals and piriformis bil  LUMBAR ROM:   AROM eval  Flexion Fingers to ankles, limited lumbar segmental reversal  Extension NT  Right lateral flexion 3, pain  Left lateral flexion 10  Right rotation 75%  Left rotation 50%, pain   (Blank rows = not tested)  LOWER EXTREMITY ROM:     WFL bil  LOWER EXTREMITY MMT:   Bil hips 3+/5 Bil knees 4/5  FUNCTIONAL TESTS:  Unable to balance in SLS on either LE, able to perform half squat, cannot march in place secondary to balance 10/31/22: 3 min walk test: 516.6 feet with back pain 4/10  GAIT: Distance walked: within clinic Assistive device utilized: None Level of assistance: Complete Independence, uses SPC for longer distances Comments: short stride length bil  TODAY'S TREATMENT:  DATE: 01/01/23 Recumbent bike L2 x 5' (Pt doesn't like NuStep), PT present to discuss status Supine LTR 3X10" Supine SKTC 3x10" Spine decompression series: 5x5": leg lengthener, whole leg press, scapular retraction Fig 4 push away/pull across bil 2x20" Trigger Point Dry-Needling  Treatment instructions: Expect mild to moderate muscle soreness. S/S of pneumothorax if dry needled over a lung field, and to seek immediate medical attention should they occur. Patient verbalized understanding of these instructions and education.  Patient Consent Given: Yes Education handout provided: Previously provided Muscles treated: bil lumbar multifidi Electrical stimulation performed: No Parameters: N/A Treatment response/outcome: release and improved resting tone - high tone at Rt L5/S1   DATE: 12/11/22 Recumbent bike L2 x 5' (Pt doesn't like NuStep), PT present to discuss  status Blue pball roll outs Hamstring stretch at vibration plate 1 min bil Hip flexor stretch with movement at vibration plate 1 min bil Manual Rt gluteals, TFL, lumbar paraspinals Trigger Point Dry-Needling  Treatment instructions: Expect mild to moderate muscle soreness. S/S of pneumothorax if dry needled over a lung field, and to seek immediate medical attention should they occur. Patient verbalized understanding of these instructions and education.  Patient Consent Given: Yes Education handout provided: Previously provided Muscles treated: lumbar, gluteal, TFL Rt side, Rt lumbar multifidi Electrical stimulation performed: No Parameters: N/A Treatment response/outcome: increased soft tissue length   12/04/22 Recumbent bike L2 x 5' (Pt doesn't like NuStep), PT present to discuss status Recommend thick yoga mat for floor routine to soften surface Supine hip flexor stretch off edge of table x60' bil Supine PPT x 10 Leg lengthener from spinal decompression series 3x5" each LE Fig 4 push away/pull across bil 3x20" Trial of elliptical x 2' Standing L counter stretch Counter plank x 30" Discussion about goals of PT (mobility, flexibility, lengthening, decompression)    PATIENT EDUCATION:  Education details: Access Code: [email or web address] Person educated: Patient Education method: Explanation, Demonstration, Verbal cues, and Handouts Education comprehension: verbalized understanding and returned demonstration  HOME EXERCISE PROGRAM: Access Code: [email or web address] URL: [email or web address] Date: 11/27/2022 Prepared by: Loistine Simas Diondra Pines  Exercises - Supine Posterior Pelvic Tilt  - 1 x daily - 7 x weekly - 1 sets - 10 reps - 5-10 sec hold - Supine Lower Trunk Rotation  - 1 x daily - 7 x weekly - 1 sets - 10 reps - Supine Double Knee to Chest  - 1 x daily - 7 x weekly - 1 sets - 3 reps - 20-30 sec hold - Supine Piriformis Stretch with Foot on Ground  - 1 x daily - 7 x weekly - 1 sets  - 3 reps - 20-30 sec hold - Standing Hamstring Stretch on Chair  - 1 x daily - 7 x weekly - 1 sets - 3 reps - 30 sec hold - Quadricep Stretch with Chair and Counter Support  - 1 x daily - 7 x weekly - 1 sets - 3 reps - 30 sec hold - Supine Straight Leg Raise  - 1 x daily - 7 x weekly - 1 sets - 10 reps - Seated Table Piriformis Stretch  - 1 x daily - 7 x weekly - 1 sets - 3 reps - 30 sec hold - Hip Flexor Stretch at Edge of Bed  - 1 x daily - 7 x weekly - 1 sets - 3 reps - 30-60 sec hold - Doorway Hip Flexor Stretch with Chair  - 1 x daily - 7 x weekly - 1 sets - 3 reps -  30 sec hold  ASSESSMENT:  CLINICAL IMPRESSION: Pt will continue to benefit from coaching and encouragement to take postural breaks throughout her active days to mitigate pain and promote bone and muscle health.  She admits the program for stretching and decompression is simple and doable and goes a long way for her body to keep doing all the things she likes to keep busy with.  She can overdo and get high spikes of pain which can take 1-2 weeks to recover from which happened a few weeks ago with Rt hip.  She is nearing readiness to d/c to HEP.  OBJECTIVE IMPAIRMENTS: Abnormal gait, decreased activity tolerance, decreased balance, decreased mobility, decreased ROM, hypomobility, impaired flexibility, improper body mechanics, postural dysfunction, and pain.   ACTIVITY LIMITATIONS: carrying, lifting, bending, standing, squatting, stairs, and locomotion level  PARTICIPATION LIMITATIONS: meal prep, cleaning, laundry, shopping, and community activity  PERSONAL FACTORS: Age, Time since onset of injury/illness/exacerbation, and 1 comorbidity: scoliosis, osteopenia  are also affecting patient's functional outcome.   REHAB POTENTIAL: Excellent  CLINICAL DECISION MAKING: Stable/uncomplicated  EVALUATION COMPLEXITY: Low   GOALS: Goals reviewed with patient? Yes  SHORT TERM GOALS: Target date: 11/18/22  Pt will be ind with initial  HEP and spinal decompression 1-2x/day. Baseline: Goal status: met 4/17  2.  Pt will return to walking at park with seated breaks (benches) 1-2x/week for 10+ min. Baseline:  Goal status: 10 min, but increased hip pain bil, met 4/17    LONG TERM GOALS: Target date: 02/05/23 Updated 12/11/22  Pt will be ind with advanced HEP for postural strength, LE functional strength, core strength and flexibility to improve overall activity tolerance. Baseline:  Goal status: IN PROGRESS  2.  Pt will be able to perform standing and walking activity for up to 1 hour with pain not to exceed 4/10. Baseline: haven't done it because of time, walking feels not as bad as it was but gets up to 5-6/10 Goal status: IN PROGRESS  3.  Pt will be able to walk at park for up to 20 min with seated breaks as needed (benches) 2 days a week. Baseline:  Goal status: DEFERRED per patient 4/24  4.  Pt will improve LE strength to at least 4+/5 for improved gait, stairs and squatting for housework. Baseline: stairs feel fine currently Goal status: IN PROGRESS  5.  Pt will improve ODI score by at least 4 points to reduce score into mild disability category from mod disability. Baseline: 8/50, 16%; 9/50 on 12/11/22  Goal status: IN PROGRESS   PLAN:  PT FREQUENCY: 1x/week  PT DURATION: 8 weeks  PLANNED INTERVENTIONS: Therapeutic exercises, Therapeutic activity, Neuromuscular re-education, Balance training, Gait training, Patient/Family education, Self Care, Joint mobilization, Aquatic Therapy, Dry Needling, Electrical stimulation, Spinal mobilization, Cryotherapy, Moist heat, Taping, and Manual therapy.  PLAN FOR NEXT SESSION:  focus on stretching, mobility, decompression,  bike (not Nustep),  DN to glutes and piriformis  Morton Peters, PT 01/01/23 11:43 AM        [facility] [address] [place], [place] [address] Phone # [phone] Fax [phone]

## 2023-01-08 ENCOUNTER — Ambulatory Visit: Payer: Medicare HMO | Admitting: Physical Therapy

## 2023-01-08 ENCOUNTER — Encounter: Payer: Self-pay | Admitting: Physical Therapy

## 2023-01-08 DIAGNOSIS — M545 Low back pain, unspecified: Secondary | ICD-10-CM | POA: Diagnosis not present

## 2023-01-08 DIAGNOSIS — G8929 Other chronic pain: Secondary | ICD-10-CM | POA: Diagnosis not present

## 2023-01-08 DIAGNOSIS — R252 Cramp and spasm: Secondary | ICD-10-CM | POA: Diagnosis not present

## 2023-01-08 DIAGNOSIS — M6281 Muscle weakness (generalized): Secondary | ICD-10-CM

## 2023-01-08 DIAGNOSIS — R293 Abnormal posture: Secondary | ICD-10-CM

## 2023-01-08 DIAGNOSIS — R262 Difficulty in walking, not elsewhere classified: Secondary | ICD-10-CM | POA: Diagnosis not present

## 2023-01-08 NOTE — Therapy (Signed)
OUTPATIENT PHYSICAL THERAPY THORACOLUMBAR TREATMENT   Progress Note Reporting Period 10/21/22 to 01/08/23  See note below for Objective Data and Assessment of Progress/Goals.      Patient Name: Linda [name] MRN: [id number] DOB:01-18-1941, 82 y.o., female Today's Date: 01/08/2023  END OF SESSION:  PT End of Session - 01/08/23 1150     Visit Number 10    Date for PT Re-Evaluation 02/05/23    Authorization Type [employer] approved 10 more visits through 6/26    Authorization - Number of Visits 10    Progress Note Due on Visit 18    PT Start Time 1147    PT Stop Time 1230    PT Time Calculation (min) 43 min    Activity Tolerance Patient tolerated treatment well    Behavior During Therapy WFL for tasks assessed/performed                    Past Medical History:  Diagnosis Date   ALLERGIC RHINITIS    Allergy    ANEMIA-IRON DEFICIENCY    Anxiety state, unspecified    Carpal tunnel syndrome    pt denies    Cataract    Closed left ankle fracture    COLONIC POLYPS, HX OF 2007   GERD    GLAUCOMA    Glaucoma    GOITER, MULTINODULAR    on [place] 2006, unchanged 6/13 with nodules all <60mm   HIATAL HERNIA WITH REFLUX    Hip dysplasia, acquired    B sx; eval at [facility] for same 01/2015 -ongoing PT   OSTEOARTHRITIS    Osteoporosis    Serrated polyp of colon    TOBACCO USE, QUIT    Past Surgical History:  Procedure Laterality Date   BREAST SURGERY  1960   Left breast cyst removed no complications   CARPAL TUNNEL RELEASE Right    COLONOSCOPY  2005   EYE SURGERY  1991-1992   both eyes   EYE SURGERY Left 2014   shunt behind left eye   ORIF FIBULA FRACTURE Left 08/24/2019   Procedure: OPEN REDUCTION INTERNAL FIXATION (ORIF) LEFT DISTAL FIBULA FRACTURE;  Surgeon: Teryl Lucy, MD;  Location: [facility];  Service: Orthopedics;  Laterality: Left;   Right shoulder aurgery  10/2008   Patient Active Problem List   Diagnosis Date Noted   Spinal stenosis of  lumbar region 10/09/2022   Facet arthritis of lumbar region 10/09/2022   Chronic bilateral low back pain without sciatica 10/04/2022   Osteopenia 11/26/2021   Pre-diabetes 11/26/2021   Other fatigue 07/28/2020   Dupuytren's disease of palm 01/19/2018   Bilateral sensorineural hearing loss 03/12/2016   Routine general medical examination at a health care facility 07/22/2015   Osteoarthritis of both hips resulting from hip dysplasia 01/25/2015   Primary osteoarthritis of right hip 01/25/2015   Anemia 11/06/2012   Anxiety 11/06/2012   Depression 11/06/2012   Cataract, nuclear 10/28/2012   Primary open angle glaucoma of both eyes, indeterminate stage 10/28/2012   Ptosis of eyelid 10/28/2012   Bladder prolapse, female, acquired 01/09/2012   GERD (gastroesophageal reflux disease) 05/21/2007   Osteoarthritis 05/21/2007    PCP: Myrlene Broker, MD  REFERRING PROVIDER: Myrlene Broker, MD  REFERRING DIAG: M54.50,G89.29 (ICD-10-CM) - Chronic bilateral low back pain without sciatic  Rationale for Evaluation and Treatment: Rehabilitation  THERAPY DIAG:  Chronic bilateral low back pain without sciatica  Abnormal posture  Muscle weakness (generalized)  ONSET DATE: 20+ years ago but  recently worsened  SUBJECTIVE:                                                                                                                                                                                           SUBJECTIVE STATEMENT: I overdid it again yesterday at my water class.  I have been walking at park more and can walk longer than I used to.    PERTINENT HISTORY:  Osteopenia, scoliosis, Hx of Lt ankle fracture with hardware  PAIN:  PAIN:  Are you having pain? Yes NPRS scale: 4/10 Pain location: midline LBP into bil buttocks Pain orientation: Bilateral, Medial, and Lower  PAIN TYPE: aching, dull, and tight Pain description: constant  Aggravating factors: prolonged standing  and walking up to 1 hour Relieving factors: sitting provides immediate relief but hips get tight   PRECAUTIONS: Other: osteopenia  WEIGHT BEARING RESTRICTIONS: No  FALLS:  Has patient fallen in last 6 months? No  LIVING ENVIRONMENT: Lives with: lives alone Lives in: House/apartment Stairs: Yes: Internal: 16 steps; can reach both Has following equipment at home: Single point cane for longer distance community outings  OCCUPATION: retired  PLOF: Independent  PATIENT GOALS: improve upright posture, strengthen core, return to walking for exercise 2-3x/week  NEXT MD VISIT: about 6 weeks, April, after PT  OBJECTIVE:   DIAGNOSTIC FINDINGS:  XRAY: Degenerative scoliosis convex left concave right. Multilevel DDD. Significant facet arthritis at L3-4 L4-5 and L5-S1  MRI 2018: IMPRESSION: Progression of spondylosis at L3-4 where there is moderate to moderately severe central canal and bilateral subarticular recess stenosis. Severe right foraminal narrowing is also seen at this level.   No notable change in narrowing of the right subarticular recess and foramen at L2-3 due to facet arthropathy and disc.   No change in narrowing of the left subarticular recess at L4-5 mainly due to facet arthropathy. There is moderate central canal stenosis overall at L4-5. No nerve root compression is identified.   Moderate left foraminal stenosis at L5-S1 appears slightly worse on the prior examination.   Severe convex left scoliosis with the apex at L3.  PATIENT SURVEYS:  01/08/23: ODI 7/50 = 14%  Modified Oswestry 16%, moderate disability category   SCREENING FOR RED FLAGS: Bowel or bladder incontinence: No Spinal tumors: No Cauda equina syndrome: No Compression fracture: No Abdominal aneurysm: No  COGNITION: Overall cognitive status: Within functional limits for tasks assessed     SENSATION: WFL - has some "dead places" in Lt foot following fracture/hardware  placement  MUSCLE LENGTH: 01/08/23: Hip flexors limited 20-30%, ongoing end range tightness bil posterior hips well managed with stretching  Hamstring  WNL bil Limited gluteals and piriformis bil with end range tightness Limited hip flexors bil 50%  POSTURE:  scoliosis present  PALPATION: Tender along bil lumbar spine and adjacent muscles, tender gluteals and piriformis bil  LUMBAR ROM:   AROM eval 01/08/23  Flexion Fingers to ankles, limited lumbar segmental reversal Fingers to ground no pain  Extension NT NT  Right lateral flexion 3, pain 15 pain  Left lateral flexion 10 10 pain  Right rotation 75% Full no pain  Left rotation 50%, pain Full no pain   (Blank rows = not tested)  LOWER EXTREMITY ROM:     WFL bil  LOWER EXTREMITY MMT:   01/08/23: Bil hips: 4+/5 Bil knees: 4+/5   Bil hips 3+/5 Bil knees 4/5  FUNCTIONAL TESTS:  01/08/23: 3 min walk test: 530', mild onset of back pain 2-3/10, no AD  Unable to balance in SLS on either LE, able to perform half squat, cannot march in place secondary to balance 10/31/22: 3 min walk test: 516.6 feet with back pain 4/10  GAIT: Distance walked: within clinic Assistive device utilized: None Level of assistance: Complete Independence, uses SPC for longer distances Comments: short stride length bil  TODAY'S TREATMENT:                                                                                                                              DATE: 01/08/23 Recumbent bike L2 x 5' (Pt doesn't like NuStep), PT present to discuss status ODI 14% MMT, trunk ROM : 530 feet no AD pain 2/10 Trigger Point Dry-Needling  Treatment instructions: Expect mild to moderate muscle soreness. S/S of pneumothorax if dry needled over a lung field, and to seek immediate medical attention should they occur. Patient verbalized understanding of these instructions and education.  Patient Consent Given: Yes Education handout provided: Previously  provided Muscles treated: bil lumbar multifidi Electrical stimulation performed: No Parameters: N/A Treatment response/outcome: cramp and release   DATE: 01/01/23 Recumbent bike L2 x 5' (Pt doesn't like NuStep), PT present to discuss status Supine LTR 3X10" Supine SKTC 3x10" Spine decompression series: 5x5": leg lengthener, whole leg press, scapular retraction Fig 4 push away/pull across bil 2x20" Trigger Point Dry-Needling  Treatment instructions: Expect mild to moderate muscle soreness. S/S of pneumothorax if dry needled over a lung field, and to seek immediate medical attention should they occur. Patient verbalized understanding of these instructions and education.  Patient Consent Given: Yes Education handout provided: Previously provided Muscles treated: bil lumbar multifidi Electrical stimulation performed: No Parameters: N/A Treatment response/outcome: release and improved resting tone - high tone at Rt L5/S1   DATE: 12/11/22 Recumbent bike L2 x 5' (Pt doesn't like NuStep), PT present to discuss status Blue pball roll outs Hamstring stretch at vibration plate 1 min bil Hip flexor stretch with movement at vibration plate 1 min bil Manual Rt gluteals, TFL, lumbar paraspinals Trigger Point Dry-Needling  Treatment instructions: Expect mild to moderate muscle  soreness. S/S of pneumothorax if dry needled over a lung field, and to seek immediate medical attention should they occur. Patient verbalized understanding of these instructions and education.  Patient Consent Given: Yes Education handout provided: Previously provided Muscles treated: lumbar, gluteal, TFL Rt side, Rt lumbar multifidi Electrical stimulation performed: No Parameters: N/A Treatment response/outcome: increased soft tissue length   12/04/22 Recumbent bike L2 x 5' (Pt doesn't like NuStep), PT present to discuss status Recommend thick yoga mat for floor routine to soften surface Supine hip flexor stretch off  edge of table x60' bil Supine PPT x 10 Leg lengthener from spinal decompression series 3x5" each LE Fig 4 push away/pull across bil 3x20" Trial of elliptical x 2' Standing L counter stretch Counter plank x 30" Discussion about goals of PT (mobility, flexibility, lengthening, decompression)    PATIENT EDUCATION:  Education details: Access Code: [email or web address] Person educated: Patient Education method: Explanation, Demonstration, Verbal cues, and Handouts Education comprehension: verbalized understanding and returned demonstration  HOME EXERCISE PROGRAM: Access Code: [email or web address] URL: [email or web address] Date: 11/27/2022 Prepared by: Loistine Simas Macall Mccroskey  Exercises - Supine Posterior Pelvic Tilt  - 1 x daily - 7 x weekly - 1 sets - 10 reps - 5-10 sec hold - Supine Lower Trunk Rotation  - 1 x daily - 7 x weekly - 1 sets - 10 reps - Supine Double Knee to Chest  - 1 x daily - 7 x weekly - 1 sets - 3 reps - 20-30 sec hold - Supine Piriformis Stretch with Foot on Ground  - 1 x daily - 7 x weekly - 1 sets - 3 reps - 20-30 sec hold - Standing Hamstring Stretch on Chair  - 1 x daily - 7 x weekly - 1 sets - 3 reps - 30 sec hold - Quadricep Stretch with Chair and Counter Support  - 1 x daily - 7 x weekly - 1 sets - 3 reps - 30 sec hold - Supine Straight Leg Raise  - 1 x daily - 7 x weekly - 1 sets - 10 reps - Seated Table Piriformis Stretch  - 1 x daily - 7 x weekly - 1 sets - 3 reps - 30 sec hold - Hip Flexor Stretch at Edge of Bed  - 1 x daily - 7 x weekly - 1 sets - 3 reps - 30-60 sec hold - Doorway Hip Flexor Stretch with Chair  - 1 x daily - 7 x weekly - 1 sets - 3 reps - 30 sec hold  ASSESSMENT:  CLINICAL IMPRESSION: Pt demos improved strength and gait endurance today with tests/measures above.  ODI has improved from from 8/50 to 7/50 today, placing her on the bubble between min to mod disability level.  She has increased LBP with "overdoing it" around the house and with prolonged  walking.  She continues to do aquatic classes 2x/week.  She is now able to walk at park for 5-10 min at a time with cane and taking seated rest breaks on benches.  She has company coming so will see her again in several weeks for her ERO.  She benefits from DN and reminders to perform spinal decompression and stretching to complement her activity level and maintain mobility.  OBJECTIVE IMPAIRMENTS: Abnormal gait, decreased activity tolerance, decreased balance, decreased mobility, decreased ROM, hypomobility, impaired flexibility, improper body mechanics, postural dysfunction, and pain.   ACTIVITY LIMITATIONS: carrying, lifting, bending, standing, squatting, stairs, and locomotion level  PARTICIPATION LIMITATIONS: meal prep, cleaning, laundry, shopping, and community  activity  PERSONAL FACTORS: Age, Time since onset of injury/illness/exacerbation, and 1 comorbidity: scoliosis, osteopenia  are also affecting patient's functional outcome.   REHAB POTENTIAL: Excellent  CLINICAL DECISION MAKING: Stable/uncomplicated  EVALUATION COMPLEXITY: Low   GOALS: Goals reviewed with patient? Yes  SHORT TERM GOALS: Target date: 11/18/22  Pt will be ind with initial HEP and spinal decompression 1-2x/day. Baseline: Goal status: met 4/17  2.  Pt will return to walking at park with seated breaks (benches) 1-2x/week for 10+ min. Baseline:  Goal status: 10 min, met 5/29    LONG TERM GOALS: Target date: 02/05/23 Updated 12/11/22  Pt will be ind with advanced HEP for postural strength, LE functional strength, core strength and flexibility to improve overall activity tolerance. Baseline:  Goal status: IN PROGRESS  2.  Pt will be able to perform standing and walking activity for up to 1 hour with pain not to exceed 4/10. Baseline: haven't done it because of time, walking feels not as bad as it was but gets up to 5-6/10 Goal status: IN PROGRESS  3.  Pt will be able to walk at park for up to 20 min with  seated breaks as needed (benches) 2 days a week. Baseline:  Goal status: can walk 5-10 min with seated breaks at park 5/29  4.  Pt will improve LE strength to at least 4+/5 for improved gait, stairs and squatting for housework. Baseline: stairs feel fine currently Goal status: met 5/29  5.  Pt will improve ODI score by at least 4 points to reduce score into mild disability category from mod disability. Baseline: 8/50, 16%; 9/50 on 12/11/22, 7/50 on 5/29 Goal status: IN PROGRESS   PLAN:  PT FREQUENCY: 1x/week  PT DURATION: 8 weeks  PLANNED INTERVENTIONS: Therapeutic exercises, Therapeutic activity, Neuromuscular re-education, Balance training, Gait training, Patient/Family education, Self Care, Joint mobilization, Aquatic Therapy, Dry Needling, Electrical stimulation, Spinal mobilization, Cryotherapy, Moist heat, Taping, and Manual therapy.  PLAN FOR NEXT SESSION:  ERO next time, consider taper for DN and therex planning  Morton Peters, PT 01/08/23 1:54 PM          [facility] [address], [address] [place], [place] [address] Phone # [phone] Fax [phone]

## 2023-01-15 ENCOUNTER — Ambulatory Visit: Payer: Medicare HMO | Admitting: Physical Therapy

## 2023-01-19 ENCOUNTER — Other Ambulatory Visit: Payer: Self-pay | Admitting: Internal Medicine

## 2023-01-30 ENCOUNTER — Ambulatory Visit: Payer: Medicare HMO | Attending: Internal Medicine | Admitting: Physical Therapy

## 2023-01-30 ENCOUNTER — Encounter: Payer: Self-pay | Admitting: Physical Therapy

## 2023-01-30 DIAGNOSIS — M545 Low back pain, unspecified: Secondary | ICD-10-CM | POA: Diagnosis not present

## 2023-01-30 DIAGNOSIS — R262 Difficulty in walking, not elsewhere classified: Secondary | ICD-10-CM | POA: Insufficient documentation

## 2023-01-30 DIAGNOSIS — R293 Abnormal posture: Secondary | ICD-10-CM | POA: Diagnosis not present

## 2023-01-30 DIAGNOSIS — R252 Cramp and spasm: Secondary | ICD-10-CM | POA: Insufficient documentation

## 2023-01-30 DIAGNOSIS — M6281 Muscle weakness (generalized): Secondary | ICD-10-CM | POA: Diagnosis not present

## 2023-01-30 DIAGNOSIS — G8929 Other chronic pain: Secondary | ICD-10-CM | POA: Diagnosis not present

## 2023-01-30 NOTE — Therapy (Signed)
OUTPATIENT PHYSICAL THERAPY THORACOLUMBAR TREATMENT     Patient Name: Linda [name] MRN: [id number] DOB:Aug 12, 1941, 82 y.o., female Today's Date: 01/30/2023  END OF SESSION:  PT End of Session - 01/30/23 1151     Visit Number 11    Date for PT Re-Evaluation 02/05/23    Authorization - Visit Number 7    Authorization - Number of Visits 10    PT Start Time 1146    PT Stop Time 1225    PT Time Calculation (min) 39 min    Activity Tolerance Patient tolerated treatment well    Behavior During Therapy WFL for tasks assessed/performed                    Past Medical History:  Diagnosis Date   ALLERGIC RHINITIS    Allergy    ANEMIA-IRON DEFICIENCY    Anxiety state, unspecified    Carpal tunnel syndrome    pt denies    Cataract    Closed left ankle fracture    COLONIC POLYPS, HX OF 2007   GERD    GLAUCOMA    Glaucoma    GOITER, MULTINODULAR    on [place] 2006, unchanged 6/13 with nodules all <36mm   HIATAL HERNIA WITH REFLUX    Hip dysplasia, acquired    B sx; eval at [facility] for same 01/2015 -ongoing PT   OSTEOARTHRITIS    Osteoporosis    Serrated polyp of colon    TOBACCO USE, QUIT    Past Surgical History:  Procedure Laterality Date   BREAST SURGERY  1960   Left breast cyst removed no complications   CARPAL TUNNEL RELEASE Right    COLONOSCOPY  2005   EYE SURGERY  1991-1992   both eyes   EYE SURGERY Left 2014   shunt behind left eye   ORIF FIBULA FRACTURE Left 08/24/2019   Procedure: OPEN REDUCTION INTERNAL FIXATION (ORIF) LEFT DISTAL FIBULA FRACTURE;  Surgeon: Teryl Lucy, MD;  Location: [facility];  Service: Orthopedics;  Laterality: Left;   Right shoulder aurgery  10/2008   Patient Active Problem List   Diagnosis Date Noted   Spinal stenosis of lumbar region 10/09/2022   Facet arthritis of lumbar region 10/09/2022   Chronic bilateral low back pain without sciatica 10/04/2022   Osteopenia 11/26/2021   Pre-diabetes 11/26/2021   Other  fatigue 07/28/2020   Dupuytren's disease of palm 01/19/2018   Bilateral sensorineural hearing loss 03/12/2016   Routine general medical examination at a health care facility 07/22/2015   Osteoarthritis of both hips resulting from hip dysplasia 01/25/2015   Primary osteoarthritis of right hip 01/25/2015   Anemia 11/06/2012   Anxiety 11/06/2012   Depression 11/06/2012   Cataract, nuclear 10/28/2012   Primary open angle glaucoma of both eyes, indeterminate stage 10/28/2012   Ptosis of eyelid 10/28/2012   Bladder prolapse, female, acquired 01/09/2012   GERD (gastroesophageal reflux disease) 05/21/2007   Osteoarthritis 05/21/2007    PCP: Myrlene Broker, MD  REFERRING PROVIDER: Myrlene Broker, MD  REFERRING DIAG: M54.50,G89.29 (ICD-10-CM) - Chronic bilateral low back pain without sciatic  Rationale for Evaluation and Treatment: Rehabilitation  THERAPY DIAG:  Chronic bilateral low back pain without sciatica  Abnormal posture  Muscle weakness (generalized)  Difficulty in walking, not elsewhere classified  Cramp and spasm  ONSET DATE: 20+ years ago but recently worsened  SUBJECTIVE:  SUBJECTIVE STATEMENT: I couldn't keep up with my daughter's family - she has an 26 year old and she wanted me to do everything with her.  I just came from water class.  The exercises are my go tos to help me get through more active days.    PERTINENT HISTORY:  Osteopenia, scoliosis, Hx of Lt ankle fracture with hardware  PAIN:  PAIN:  Are you having pain? Yes NPRS scale: 4/10 Pain location: midline LBP into bil buttocks Pain orientation: Bilateral, Medial, and Lower  PAIN TYPE: aching, dull, and tight Pain description: constant  Aggravating factors: prolonged standing and walking up to 1  hour Relieving factors: sitting provides immediate relief but hips get tight   PRECAUTIONS: Other: osteopenia  WEIGHT BEARING RESTRICTIONS: No  FALLS:  Has patient fallen in last 6 months? No  LIVING ENVIRONMENT: Lives with: lives alone Lives in: House/apartment Stairs: Yes: Internal: 16 steps; can reach both Has following equipment at home: Single point cane for longer distance community outings  OCCUPATION: retired  PLOF: Independent  PATIENT GOALS: improve upright posture, strengthen core, return to walking for exercise 2-3x/week  NEXT MD VISIT: about 6 weeks, April, after PT  OBJECTIVE:   DIAGNOSTIC FINDINGS:  XRAY: Degenerative scoliosis convex left concave right. Multilevel DDD. Significant facet arthritis at L3-4 L4-5 and L5-S1  MRI 2018: IMPRESSION: Progression of spondylosis at L3-4 where there is moderate to moderately severe central canal and bilateral subarticular recess stenosis. Severe right foraminal narrowing is also seen at this level.   No notable change in narrowing of the right subarticular recess and foramen at L2-3 due to facet arthropathy and disc.   No change in narrowing of the left subarticular recess at L4-5 mainly due to facet arthropathy. There is moderate central canal stenosis overall at L4-5. No nerve root compression is identified.   Moderate left foraminal stenosis at L5-S1 appears slightly worse on the prior examination.   Severe convex left scoliosis with the apex at L3.  PATIENT SURVEYS:  01/30/23: ODI: 12%  01/08/23: ODI 7/50 = 14%  Modified Oswestry 16%, moderate disability category   SCREENING FOR RED FLAGS: Bowel or bladder incontinence: No Spinal tumors: No Cauda equina syndrome: No Compression fracture: No Abdominal aneurysm: No  COGNITION: Overall cognitive status: Within functional limits for tasks assessed     SENSATION: WFL - has some "dead places" in Lt foot following fracture/hardware  placement  MUSCLE LENGTH: 01/08/23: Hip flexors limited 20-30%, ongoing end range tightness bil posterior hips well managed with stretching  Hamstring WNL bil Limited gluteals and piriformis bil with end range tightness Limited hip flexors bil 50%  POSTURE:  scoliosis present  PALPATION: Tender along bil lumbar spine and adjacent muscles, tender gluteals and piriformis bil  LUMBAR ROM:   AROM eval 01/08/23  Flexion Fingers to ankles, limited lumbar segmental reversal Fingers to ground no pain  Extension NT NT  Right lateral flexion 3, pain 15 pain  Left lateral flexion 10 10 pain  Right rotation 75% Full no pain  Left rotation 50%, pain Full no pain   (Blank rows = not tested)  LOWER EXTREMITY ROM:     WFL bil  LOWER EXTREMITY MMT:   01/08/23: Bil hips: 4+/5 Bil knees: 4+/5   Bil hips 3+/5 Bil knees 4/5  FUNCTIONAL TESTS:  01/08/23: 3 min walk test: 530', mild onset of back pain 2-3/10, no AD  Unable to balance in SLS on either LE, able to perform half squat, cannot march in  place secondary to balance 10/31/22: 3 min walk test: 516.6 feet with back pain 4/10  GAIT: Distance walked: within clinic Assistive device utilized: None Level of assistance: Complete Independence, uses SPC for longer distances Comments: short stride length bil  TODAY'S TREATMENT:                                                                                                                              DATE:  01/30/23 Recumbent bike L2 x 5' (Pt doesn't like NuStep), PT present to discuss status ODI 12% Spine decompression series: 5x5": leg lengthener, whole leg press, scapular retraction, cervical retraction Supine LTR 3X10" Supine SKTC 3x10" Supine HS stretch hands behind thigh 3x10" Trigger Point Dry-Needling  Treatment instructions: Expect mild to moderate muscle soreness. S/S of pneumothorax if dry needled over a lung field, and to seek immediate medical attention should they occur.  Patient verbalized understanding of these instructions and education.  Patient Consent Given: Yes Education handout provided: Previously provided Muscles treated: bil lumbar multifidi Electrical stimulation performed: No Parameters: N/A Treatment response/outcome: deep ache and elongation STM bil lumbar and hips after DN in prone   01/08/23 Recumbent bike L2 x 5' (Pt doesn't like NuStep), PT present to discuss status ODI 14% MMT, trunk ROM : 530 feet no AD pain 2/10 Trigger Point Dry-Needling  Treatment instructions: Expect mild to moderate muscle soreness. S/S of pneumothorax if dry needled over a lung field, and to seek immediate medical attention should they occur. Patient verbalized understanding of these instructions and education.  Patient Consent Given: Yes Education handout provided: Previously provided Muscles treated: bil lumbar multifidi Electrical stimulation performed: No Parameters: N/A Treatment response/outcome: cramp and release   DATE: 01/01/23 Recumbent bike L2 x 5' (Pt doesn't like NuStep), PT present to discuss status Supine LTR 3X10" Supine SKTC 3x10" Spine decompression series: 5x5": leg lengthener, whole leg press, scapular retraction Fig 4 push away/pull across bil 2x20" Trigger Point Dry-Needling  Treatment instructions: Expect mild to moderate muscle soreness. S/S of pneumothorax if dry needled over a lung field, and to seek immediate medical attention should they occur. Patient verbalized understanding of these instructions and education.  Patient Consent Given: Yes Education handout provided: Previously provided Muscles treated: bil lumbar multifidi Electrical stimulation performed: No Parameters: N/A Treatment response/outcome: release and improved resting tone - high tone at Rt L5/S1   PATIENT EDUCATION:  Education details: Access Code: [email or web address] Person educated: Patient Education method: Programmer, multimedia, Demonstration, Verbal cues, and  Handouts Education comprehension: verbalized understanding and returned demonstration  HOME EXERCISE PROGRAM: Access Code: [email or web address] URL: [email or web address] Date: 11/27/2022 Prepared by: Loistine Simas Breslin Hemann  Exercises - Supine Posterior Pelvic Tilt  - 1 x daily - 7 x weekly - 1 sets - 10 reps - 5-10 sec hold - Supine Lower Trunk Rotation  - 1 x daily - 7 x weekly - 1 sets - 10 reps - Supine Double Knee to Chest  - 1 x daily -  7 x weekly - 1 sets - 3 reps - 20-30 sec hold - Supine Piriformis Stretch with Foot on Ground  - 1 x daily - 7 x weekly - 1 sets - 3 reps - 20-30 sec hold - Standing Hamstring Stretch on Chair  - 1 x daily - 7 x weekly - 1 sets - 3 reps - 30 sec hold - Quadricep Stretch with Chair and Counter Support  - 1 x daily - 7 x weekly - 1 sets - 3 reps - 30 sec hold - Supine Straight Leg Raise  - 1 x daily - 7 x weekly - 1 sets - 10 reps - Seated Table Piriformis Stretch  - 1 x daily - 7 x weekly - 1 sets - 3 reps - 30 sec hold - Hip Flexor Stretch at Edge of Bed  - 1 x daily - 7 x weekly - 1 sets - 3 reps - 30-60 sec hold - Doorway Hip Flexor Stretch with Chair  - 1 x daily - 7 x weekly - 1 sets - 3 reps - 30 sec hold  ASSESSMENT:  CLINICAL IMPRESSION: Pt returns after traveling.  She came from aquatic exercise class.  Pt met ODI goal today.  She has met all goals and is ready to d/c to HEP with focus on midday spinal decompression and stretching.  She demos and reports improved strength, gait endurance, and standing tolerance.  She has increased LBP with "overdoing it" around the house and with prolonged walking.  She will continue to do aquatic classes 2x/week.  She is ready to d/c to HEP and may benefit from another round of PT in the future if she gets more pain flare up.  OBJECTIVE IMPAIRMENTS: Abnormal gait, decreased activity tolerance, decreased balance, decreased mobility, decreased ROM, hypomobility, impaired flexibility, improper body mechanics,  postural dysfunction, and pain.   ACTIVITY LIMITATIONS: carrying, lifting, bending, standing, squatting, stairs, and locomotion level  PARTICIPATION LIMITATIONS: meal prep, cleaning, laundry, shopping, and community activity  PERSONAL FACTORS: Age, Time since onset of injury/illness/exacerbation, and 1 comorbidity: scoliosis, osteopenia  are also affecting patient's functional outcome.   REHAB POTENTIAL: Excellent  CLINICAL DECISION MAKING: Stable/uncomplicated  EVALUATION COMPLEXITY: Low   GOALS: Goals reviewed with patient? Yes  SHORT TERM GOALS: Target date: 11/18/22  Pt will be ind with initial HEP and spinal decompression 1-2x/day. Baseline: Goal status: met 4/17  2.  Pt will return to walking at park with seated breaks (benches) 1-2x/week for 10+ min. Baseline:  Goal status: 10 min, met 5/29    LONG TERM GOALS: Target date: 02/05/23 Updated 12/11/22  Pt will be ind with advanced HEP for postural strength, LE functional strength, core strength and flexibility to improve overall activity tolerance. Baseline:  Goal status: met 6/20  2.  Pt will be able to perform standing and walking activity for up to 1 hour with pain not to exceed 4/10. Baseline: haven't done it because of time, walking feels not as bad as it was but gets up to 5-6/10 Goal status: met 6/20  3.  Pt will be able to walk at park for up to 20 min with seated breaks as needed (benches) 2 days a week. Baseline:  Goal status: 10-15', partially met  4.  Pt will improve LE strength to at least 4+/5 for improved gait, stairs and squatting for housework. Baseline: stairs feel fine currently Goal status: met 5/29  5.  Pt will improve ODI score by at least 4 points to  reduce score into mild disability category from mod disability. Baseline: 8/50, 16%; 9/50 on 12/11/22, 7/50 on 5/29, 12% on 6/20 Goal status: met 6/20   PLAN:  PT FREQUENCY: 1x/week  PT DURATION: 8 weeks  PLANNED INTERVENTIONS:  Therapeutic exercises, Therapeutic activity, Neuromuscular re-education, Balance training, Gait training, Patient/Family education, Self Care, Joint mobilization, Aquatic Therapy, Dry Needling, Electrical stimulation, Spinal mobilization, Cryotherapy, Moist heat, Taping, and Manual therapy.  PLAN FOR NEXT SESSION:  d/c to HEP   PHYSICAL THERAPY DISCHARGE SUMMARY  Visits from Start of Care: 11  Current functional level related to goals / functional outcomes: See above   Remaining deficits: See above   Education / Equipment: HEP   Patient agrees to discharge. Patient goals were met. Patient is being discharged due to meeting the stated rehab goals.     Morton Peters, PT 01/30/23 12:28 PM       [facility] [address] [place], [place] [address] Phone # [phone] Fax [phone]

## 2023-02-17 ENCOUNTER — Other Ambulatory Visit: Payer: Self-pay

## 2023-02-18 ENCOUNTER — Ambulatory Visit (INDEPENDENT_AMBULATORY_CARE_PROVIDER_SITE_OTHER): Payer: Medicare HMO | Admitting: Internal Medicine

## 2023-02-18 ENCOUNTER — Encounter: Payer: Self-pay | Admitting: Internal Medicine

## 2023-02-18 VITALS — BP 140/80 | HR 80 | Temp 98.4°F | Ht 62.0 in | Wt 139.2 lb

## 2023-02-18 DIAGNOSIS — J22 Unspecified acute lower respiratory infection: Secondary | ICD-10-CM | POA: Diagnosis not present

## 2023-02-18 MED ORDER — AZITHROMYCIN 250 MG PO TABS: ORAL_TABLET | ORAL | 0 refills | Status: DC

## 2023-02-18 MED ORDER — BENZONATATE 200 MG PO CAPS
200.0000 mg | ORAL_CAPSULE | Freq: Two times a day (BID) | ORAL | 0 refills | Status: DC | PRN
Start: 2023-02-18 — End: 2023-03-18

## 2023-02-18 NOTE — Progress Notes (Signed)
Subjective:    Patient ID: Linda [name], female    DOB: 1941/06/08, 82 y.o.   MRN: [id number]      HPI Linda [name] is here for  Chief Complaint  Patient presents with   Cough    Cough for a couple of weeks (feels like she constantly has drainage in throat)     Cough x 2 weeks or so. The cough has been off and on but it has gotten worse. Has PND and that is what causes the cough. She has been sneezing.  She has a pressure headache.  Taking hall lozenges.   This is similar to what she had in the wintertime and it seemed to hang on forever.  She feels her symptoms are getting worse.   Medications and allergies reviewed with patient and updated if appropriate.  Current Outpatient Medications on File Prior to Visit  Medication Sig Dispense Refill   acetaminophen (TYLENOL) 650 MG CR tablet Take 1,300 mg by mouth 2 (two) times daily.     b complex vitamins tablet Take 1 tablet by mouth daily.     busPIRone (BUSPAR) 5 MG tablet Take 1 tablet (5 mg total) by mouth 2 (two) times daily as needed. Annual appt due in April must see provider for future refills 90 tablet 3   Calcium-Phosphorus-Vitamin D (CITRACAL +D3 PO) Take by mouth. 600mg  +1000 units twice daily     dorzolamide-timolol (COSOPT) 22.3-6.8 MG/ML ophthalmic solution Place 1 drop into both eyes 2 (two) times daily.     estradiol (ESTRACE) 0.1 MG/GM vaginal cream Place 1 Applicatorful vaginally 2 (two) times a week.     omeprazole (PRILOSEC) 40 MG capsule Take 1 capsule (40 mg total) by mouth daily. 90 capsule 3   Propylene Glycol (SYSTANE BALANCE) 0.6 % SOLN Apply 1 drop to eye in the morning and at bedtime.     sertraline (ZOLOFT) 100 MG tablet TAKE ONE TABLET BY MOUTH ONCE DAILY 90 tablet 2   No current facility-administered medications on file prior to visit.    Review of Systems  Constitutional:  Negative for appetite change, chills and fever.  HENT:  Positive for postnasal drip and sneezing. Negative for  congestion, ear pain, sinus pressure, sinus pain and sore throat.   Respiratory:  Positive for cough. Negative for chest tightness, shortness of breath and wheezing.   Gastrointestinal:  Negative for diarrhea and nausea.  Musculoskeletal:  Negative for myalgias.  Neurological:  Positive for headaches (occ pressure headache). Negative for dizziness and light-headedness.       Objective:   Vitals:   02/18/23 1402  BP: (!) 140/80  Pulse: 80  Temp: 98.4 F (36.9 C)  SpO2: 96%   BP Readings from Last 3 Encounters:  02/18/23 (!) 140/80  12/06/22 (!) 168/70  11/19/22 126/64   Wt Readings from Last 3 Encounters:  02/18/23 139 lb 3.2 oz (63.1 kg)  12/06/22 138 lb (62.6 kg)  12/05/22 141 lb (64 kg)   Body mass index is 25.46 kg/m.    Physical Exam Constitutional:      General: She is not in acute distress.    Appearance: Normal appearance. She is not ill-appearing.  HENT:     Head: Normocephalic and atraumatic.     Right Ear: Tympanic membrane, ear canal and external ear normal.     Left Ear: Tympanic membrane, ear canal and external ear normal.     Mouth/Throat:     Mouth: Mucous membranes are moist.  Pharynx: No oropharyngeal exudate or posterior oropharyngeal erythema.  Eyes:     Conjunctiva/sclera: Conjunctivae normal.  Cardiovascular:     Rate and Rhythm: Normal rate and regular rhythm.  Pulmonary:     Effort: Pulmonary effort is normal. No respiratory distress.     Breath sounds: Normal breath sounds. No wheezing or rales.  Musculoskeletal:     Cervical back: Neck supple. No tenderness.  Lymphadenopathy:     Cervical: No cervical adenopathy.  Skin:    General: Skin is warm and dry.  Neurological:     Mental Status: She is alert.            Assessment & Plan:    Lower respiratory tract infection Acute Symptoms consistent with illness over the winter that did require an antibiotic Has lots of drainage, cough Start Z-Pak, Tessalon Perles Advised  Mucinex Increase fluids Call if no improvement

## 2023-02-18 NOTE — Patient Instructions (Addendum)
      Medications changes include :   zpak.   Take the benzoate cough pills.  Take mucinex- DM.   Increase your fluids.          Return if symptoms worsen or fail to improve.

## 2023-03-05 DIAGNOSIS — H401134 Primary open-angle glaucoma, bilateral, indeterminate stage: Secondary | ICD-10-CM | POA: Diagnosis not present

## 2023-03-11 DIAGNOSIS — L920 Granuloma annulare: Secondary | ICD-10-CM | POA: Diagnosis not present

## 2023-03-18 ENCOUNTER — Encounter: Payer: Self-pay | Admitting: Internal Medicine

## 2023-03-18 ENCOUNTER — Ambulatory Visit (INDEPENDENT_AMBULATORY_CARE_PROVIDER_SITE_OTHER): Payer: Medicare HMO | Admitting: Internal Medicine

## 2023-03-18 VITALS — BP 130/70 | HR 70 | Ht 64.0 in | Wt 141.0 lb

## 2023-03-18 DIAGNOSIS — Z8719 Personal history of other diseases of the digestive system: Secondary | ICD-10-CM

## 2023-03-18 DIAGNOSIS — K219 Gastro-esophageal reflux disease without esophagitis: Secondary | ICD-10-CM | POA: Diagnosis not present

## 2023-03-18 DIAGNOSIS — Z860100 Personal history of colon polyps, unspecified: Secondary | ICD-10-CM

## 2023-03-18 DIAGNOSIS — K2950 Unspecified chronic gastritis without bleeding: Secondary | ICD-10-CM

## 2023-03-18 MED ORDER — OMEPRAZOLE 40 MG PO CPDR: 40.0000 mg | DELAYED_RELEASE_CAPSULE | Freq: Every day | ORAL | 3 refills | Status: DC

## 2023-03-18 NOTE — Progress Notes (Signed)
   Subjective:    Patient ID: Linda [name], female    DOB: 03/18/1941, 82 y.o.   MRN: [id number]  HPI Linda [name] is an 82 year old female with a history of gastritis, GERD, subcentimeter SSP's who is here for follow-up.  She is here alone today last saw her in May 2022.  She reports she is feeling well.  She has continued omeprazole 40 mg daily.  She feels that this works well.  Very rarely she will feel pressure sensation in between her breasts and at the xiphoid.  This feels at times like there is a "golf ball" there.  This is not exertional and not associated with dyspnea or radiation of the pain.  She continues to be very active swimming/water aerobics a couple days a week.  Bowel movements regular without blood or melena.  She has having more trouble with her eyesight.  No recent falls.  She has been living in an apartment off of [address] but thinking strongly about moving to [facility] which would be more social.  Her husband [name] died now 5 and half years ago.  Her daughter lives in [place] and her son lives in [place].   Review of Systems As per HPI, otherwise negative  Current Medications, Allergies, Past Medical History, Past Surgical History, Family History and Social History were reviewed in [employer] record.    Objective:   Physical Exam BP 130/70   Pulse 70   Ht 5\' 4"  (1.626 m)   Wt 141 lb (64 kg)   BMI 24.20 kg/m  Gen: awake, alert, NAD HEENT: anicteric  Abd: soft, NT/ND, +BS throughout Ext: no c/c/e Neuro: nonfocal      Assessment & Plan:  82 year old female with a history of gastritis, GERD, subcentimeter SSP's who is here for follow-up.   GERD and history of gastritis --omeprazole is working very well for her.  She will continue this.  I told her to try liquid Gaviscon for the rare and intermittent substernal chest discomfort.  If Gaviscon does not completely relieve the pain or if the pain becomes  exertional, associated with dyspnea she should let me or Dr. Okey Dupre know immediately. -- Omeprazole 40 mg daily -- Liquid Gaviscon over-the-counter per box instruction for breakthrough symptoms  2.  History of sessile serrated colon polyps --she has decided to discontinue surveillance colonoscopy based on age.  We discussed this again today and it is reasonable.  2-year follow-up, sooner if needed  30 minutes total spent today including patient facing time, coordination of care, reviewing medical history/procedures/pertinent radiology studies, and documentation of the encounter.

## 2023-03-18 NOTE — Patient Instructions (Signed)
We have sent the following medications to your pharmacy for you to pick up at your convenience: omeprazole 40 mg daily.   Please follow up with Dr. Rhea Belton in 2 years.  _______________________________________________________  If your blood pressure at your visit was 140/90 or greater, please contact your primary care physician to follow up on this.  _______________________________________________________  If you are age 82 or older, your body mass index should be between 23-30. Your Body mass index is 24.2 kg/m. If this is out of the aforementioned range listed, please consider follow up with your Primary Care Provider.  If you are age 79 or younger, your body mass index should be between 19-25. Your Body mass index is 24.2 kg/m. If this is out of the aformentioned range listed, please consider follow up with your Primary Care Provider.   ________________________________________________________  The [facility] providers would like to encourage you to use [facility] to communicate with providers for non-urgent requests or questions.  Due to long hold times on the telephone, sending your provider a message by [facility] may be a faster and more efficient way to get a response.  Please allow 48 business hours for a response.  Please remember that this is for non-urgent requests.  _______________________________________________________

## 2023-04-15 DIAGNOSIS — Z1231 Encounter for screening mammogram for malignant neoplasm of breast: Secondary | ICD-10-CM | POA: Diagnosis not present

## 2023-04-15 LAB — HM MAMMOGRAPHY

## 2023-04-17 ENCOUNTER — Encounter: Payer: Self-pay | Admitting: Internal Medicine

## 2023-04-30 ENCOUNTER — Ambulatory Visit (INDEPENDENT_AMBULATORY_CARE_PROVIDER_SITE_OTHER): Payer: Medicare HMO | Admitting: Internal Medicine

## 2023-04-30 ENCOUNTER — Encounter: Payer: Self-pay | Admitting: Internal Medicine

## 2023-04-30 VITALS — BP 134/86 | HR 78 | Temp 99.1°F | Ht 64.0 in

## 2023-04-30 DIAGNOSIS — J069 Acute upper respiratory infection, unspecified: Secondary | ICD-10-CM | POA: Diagnosis not present

## 2023-04-30 NOTE — Assessment & Plan Note (Signed)
Declines covid-19 testing today and outside window for paxlovid. She is having mild improvement in symptoms. Discussed quarantine and otc medications to help. Call/return if worsening or no improvement.

## 2023-04-30 NOTE — Patient Instructions (Signed)
You can use mucinex DM or delsym to help with the cough.

## 2023-04-30 NOTE — Progress Notes (Signed)
Subjective:   Patient ID: Linda [name], female    DOB: [birth year], 82 y.o.   MRN: [id number]  Sore Throat  Associated symptoms include congestion and coughing. Pertinent negatives include no ear discharge, ear pain, shortness of breath or trouble swallowing.   The patient is an 82 YO female coming in for sick visit. Symptoms started > 5 days ago.  Review of Systems  Constitutional:  Positive for activity change, appetite change and chills. Negative for fatigue, fever and unexpected weight change.  HENT:  Positive for congestion, postnasal drip, rhinorrhea and sinus pressure. Negative for ear discharge, ear pain, sinus pain, sneezing, sore throat, tinnitus, trouble swallowing and voice change.   Eyes: Negative.   Respiratory:  Positive for cough. Negative for chest tightness, shortness of breath and wheezing.   Cardiovascular: Negative.   Gastrointestinal: Negative.   Musculoskeletal:  Positive for myalgias.  Neurological: Negative.     Objective:  Physical Exam Constitutional:      Appearance: She is well-developed.  HENT:     Head: Normocephalic and atraumatic.     Comments: Oropharynx with redness and clear drainage, nose with swollen turbinates, TMs normal bilaterally.  Neck:     Thyroid: No thyromegaly.  Cardiovascular:     Rate and Rhythm: Normal rate and regular rhythm.  Pulmonary:     Effort: Pulmonary effort is normal. No respiratory distress.     Breath sounds: Normal breath sounds. No wheezing or rales.  Abdominal:     Palpations: Abdomen is soft.  Musculoskeletal:        General: Tenderness present.     Cervical back: Normal range of motion.  Lymphadenopathy:     Cervical: No cervical adenopathy.  Skin:    General: Skin is warm and dry.  Neurological:     Mental Status: She is alert and oriented to person, place, and time.     Vitals:   04/30/23 1014  BP: 134/86  Pulse: 78  Temp: 99.1 F (37.3 C)  TempSrc: Oral  SpO2: 90%  Height: 5\' 4"  (1.626  m)    Assessment & Plan:  Visit time 15 minutes in face to face communication with patient and coordination of care, additional 5 minutes spent in record review, coordination or care, ordering tests, communicating/referring to other healthcare professionals, documenting in medical records all on the same day of the visit for total time 20 minutes spent on the visit.

## 2023-05-30 ENCOUNTER — Other Ambulatory Visit: Payer: Self-pay | Admitting: Internal Medicine

## 2023-06-04 DIAGNOSIS — H6501 Acute serous otitis media, right ear: Secondary | ICD-10-CM | POA: Diagnosis not present

## 2023-06-24 ENCOUNTER — Encounter: Payer: Self-pay | Admitting: Orthopaedic Surgery

## 2023-06-24 ENCOUNTER — Other Ambulatory Visit (INDEPENDENT_AMBULATORY_CARE_PROVIDER_SITE_OTHER): Payer: Self-pay

## 2023-06-24 ENCOUNTER — Ambulatory Visit: Payer: Medicare HMO | Admitting: Orthopaedic Surgery

## 2023-06-24 DIAGNOSIS — M1611 Unilateral primary osteoarthritis, right hip: Secondary | ICD-10-CM

## 2023-06-24 NOTE — Progress Notes (Signed)
Office Visit Note   Patient: Linda [name]           Date of Birth: 11/25/1940           MRN: [id number] Visit Date: 06/24/2023              Requested by: Myrlene Broker, MD [address] [place],  [place] [address] PCP: Myrlene Broker, MD   Assessment & Plan: Visit Diagnoses:  1. Primary osteoarthritis of right hip     Plan: Aiman is a very pleasant 82 year old female with symptomatic right hip DJD which is moderate.  We will try an intra-articular cortisone injection under ultrasound.  I will make referral to Dr. Shon Baton for this.  She will follow-up if she does not feel any improvement from this injection.  Follow-Up Instructions: No follow-ups on file.   Orders:  Orders Placed This Encounter  Procedures   XR HIP UNILAT W OR W/O PELVIS 2-3 VIEWS RIGHT   No orders of the defined types were placed in this encounter.     Procedures: No procedures performed   Clinical Data: No additional findings.   Subjective: Chief Complaint  Patient presents with   Right Hip - Pain    HPI Linda [name] is an 82 year old female here for evaluation of right hip pain that started in the spring.  Her PCP sent her to physical therapy which helped.  She started having pain again a few weeks ago.  She feels pain in the lateral side and into the thigh.  Denies any low back pain.  She has pain with sitting and standing.  Water aerobics does not help.  Review of Systems  Constitutional: Negative.   HENT: Negative.    Eyes: Negative.   Respiratory: Negative.    Cardiovascular: Negative.   Endocrine: Negative.   Musculoskeletal: Negative.   Neurological: Negative.   Hematological: Negative.   Psychiatric/Behavioral: Negative.    All other systems reviewed and are negative.    Objective: Vital Signs: There were no vitals taken for this visit.  Physical Exam Vitals and nursing note reviewed.  Constitutional:      Appearance: She is well-developed.  HENT:      Head: Atraumatic.     Nose: Nose normal.  Eyes:     Extraocular Movements: Extraocular movements intact.  Cardiovascular:     Pulses: Normal pulses.  Pulmonary:     Effort: Pulmonary effort is normal.  Abdominal:     Palpations: Abdomen is soft.  Musculoskeletal:     Cervical back: Neck supple.  Skin:    General: Skin is warm.     Capillary Refill: Capillary refill takes less than 2 seconds.  Neurological:     Mental Status: She is alert. Mental status is at baseline.  Psychiatric:        Behavior: Behavior normal.        Thought Content: Thought content normal.        Judgment: Judgment normal.     Ortho Exam Exam of the right hip shows significant pain with FADIR and Stinchfield.  No trochanteric tenderness. Specialty Comments:  No specialty comments available.  Imaging: XR HIP UNILAT W OR W/O PELVIS 2-3 VIEWS RIGHT  Result Date: 06/24/2023 X-rays of the right hip show moderate osteoarthritis with about 50% joint space narrowing.  No acute abnormalities.    PMFS History: Patient Active Problem List   Diagnosis Date Noted   Spinal stenosis of lumbar region 10/09/2022   Facet  arthritis of lumbar region 10/09/2022   Chronic bilateral low back pain without sciatica 10/04/2022   Osteopenia 11/26/2021   Pre-diabetes 11/26/2021   Other fatigue 07/28/2020   Dupuytren's disease of palm 01/19/2018   Viral URI 01/23/2017   Bilateral sensorineural hearing loss 03/12/2016   Routine general medical examination at a health care facility 07/22/2015   Osteoarthritis of both hips resulting from hip dysplasia 01/25/2015   Primary osteoarthritis of right hip 01/25/2015   Anemia 11/06/2012   Anxiety 11/06/2012   Depression 11/06/2012   Cataract, nuclear 10/28/2012   Primary open angle glaucoma of both eyes, indeterminate stage 10/28/2012   Ptosis of eyelid 10/28/2012   Bladder prolapse, female, acquired 01/09/2012   GERD (gastroesophageal reflux disease) 05/21/2007    Osteoarthritis 05/21/2007   Past Medical History:  Diagnosis Date   ALLERGIC RHINITIS    Allergy    ANEMIA-IRON DEFICIENCY    Anxiety state, unspecified    Carpal tunnel syndrome    pt denies    Cataract    Closed left ankle fracture    COLONIC POLYPS, HX OF 2007   GERD    GLAUCOMA    Glaucoma    GOITER, MULTINODULAR    on [place] 2006, unchanged 6/13 with nodules all <75mm   HIATAL HERNIA WITH REFLUX    Hip dysplasia, acquired    B sx; eval at [facility] for same 01/2015 -ongoing PT   OSTEOARTHRITIS    Osteoporosis    Serrated polyp of colon    TOBACCO USE, QUIT     Family History  Problem Relation Age of Onset   Diabetes Mother    Heart disease Mother    Arthritis Mother    Hypertension Mother    Arthritis Father    Colon cancer Paternal Grandmother    Esophageal cancer Neg Hx    Rectal cancer Neg Hx    Stomach cancer Neg Hx     Past Surgical History:  Procedure Laterality Date   BREAST SURGERY  1960   Left breast cyst removed no complications   CARPAL TUNNEL RELEASE Right    COLONOSCOPY  2005   EYE SURGERY  1991-1992   both eyes   EYE SURGERY Left 2014   shunt behind left eye   ORIF FIBULA FRACTURE Left 08/24/2019   Procedure: OPEN REDUCTION INTERNAL FIXATION (ORIF) LEFT DISTAL FIBULA FRACTURE;  Surgeon: Teryl Lucy, MD;  Location: [facility];  Service: Orthopedics;  Laterality: Left;   Right shoulder aurgery  10/2008   Social History   Occupational History   Not on file  Tobacco Use   Smoking status: Former    Current packs/day: 0.00    Types: Cigarettes    Quit date: 08/12/1988    Years since quitting: 34.8   Smokeless tobacco: Never  Vaping Use   Vaping status: Never Used  Substance and Sexual Activity   Alcohol use: Yes    Alcohol/week: 0.0 standard drinks of alcohol    Comment: occassionally   Drug use: No   Sexual activity: Not Currently    Birth control/protection: Post-menopausal

## 2023-06-25 DIAGNOSIS — H90A31 Mixed conductive and sensorineural hearing loss, unilateral, right ear with restricted hearing on the contralateral side: Secondary | ICD-10-CM | POA: Diagnosis not present

## 2023-06-25 DIAGNOSIS — H6501 Acute serous otitis media, right ear: Secondary | ICD-10-CM | POA: Diagnosis not present

## 2023-06-26 ENCOUNTER — Ambulatory Visit: Payer: Medicare HMO | Admitting: Sports Medicine

## 2023-06-26 ENCOUNTER — Encounter: Payer: Self-pay | Admitting: Sports Medicine

## 2023-06-26 ENCOUNTER — Other Ambulatory Visit: Payer: Self-pay

## 2023-06-26 DIAGNOSIS — M1611 Unilateral primary osteoarthritis, right hip: Secondary | ICD-10-CM | POA: Diagnosis not present

## 2023-06-26 DIAGNOSIS — H903 Sensorineural hearing loss, bilateral: Secondary | ICD-10-CM | POA: Diagnosis not present

## 2023-06-26 DIAGNOSIS — H90A31 Mixed conductive and sensorineural hearing loss, unilateral, right ear with restricted hearing on the contralateral side: Secondary | ICD-10-CM | POA: Diagnosis not present

## 2023-06-26 DIAGNOSIS — H6521 Chronic serous otitis media, right ear: Secondary | ICD-10-CM | POA: Diagnosis not present

## 2023-06-26 MED ORDER — METHYLPREDNISOLONE ACETATE 40 MG/ML IJ SUSP
80.0000 mg | INTRAMUSCULAR | Status: AC | PRN
Start: 2023-06-26 — End: 2023-06-26
  Administered 2023-06-26: 80 mg via INTRA_ARTICULAR

## 2023-06-26 MED ORDER — LIDOCAINE HCL 1 % IJ SOLN
4.0000 mL | INTRAMUSCULAR | Status: AC | PRN
Start: 2023-06-26 — End: 2023-06-26
  Administered 2023-06-26: 4 mL

## 2023-06-26 NOTE — Progress Notes (Signed)
   Office & Procedure Note  Patient: Linda [name]             Date of Birth: 02/14/1941           MRN: [id number]             Visit Date: 06/26/2023  HPI: Linda "[name]" is a very pleasant 82 year old female who has acute on chronic right hip pain.  Had been seen previously by my partner, Dr. Roda Shutters, diagnosed with moderate hip osteoarthritis.  She is interested in considering hip injection.  She is a very active individual, has questions regarding aquatic/aerobic and other therapy and when she can return.  PE: - Right hip: There is limitation with internal and external logroll.  Positive FADIR test.  No bony tenderness or effusion.  Imaging: XR HIP UNILAT W OR W/O PELVIS 2-3 VIEWS RIGHT X-rays of the right hip show moderate osteoarthritis with about 50% joint  space narrowing.  No acute abnormalities.  Visit Diagnoses:  1. Primary osteoarthritis of right hip    Procedures:  Large Joint Inj: R hip joint on 06/26/2023 10:34 AM Indications: pain Details: 22 G 3.5 in needle, ultrasound-guided anterior approach Medications: 4 mL lidocaine 1 %; 80 mg methylPREDNISolone acetate 40 MG/ML Outcome: tolerated well, no immediate complications  Procedure: US-guided intra-articular hip injection, Right After discussion on risks/benefits/indications and informed verbal consent was obtained, a timeout was performed. Patient was lying supine on exam table. The hip was cleaned with betadine and alcohol swabs. Then utilizing ultrasound guidance, the patient's femoral head and neck junction was identified and subsequently injected with 4:2 lidocaine:depomedrol via an in-plane approach with ultrasound visualization of the injectate administered into the hip joint. Patient tolerated procedure well without immediate complications.  Procedure, treatment alternatives, risks and benefits explained, specific risks discussed. Consent was given by the patient. Immediately prior to procedure a time out was  called to verify the correct patient, procedure, equipment, support staff and site/side marked as required. Patient was prepped and draped in the usual sterile fashion.     Plan: -Through shared decision making, we did proceed with ultrasound-guided intra-articular right hip injection, patient tolerated well.  We did evaluate about 5 minutes after the injection and she already was having some relief of pain from the anesthetic portion. -Did discuss with her I would like her to have about 48-72 hours of modified rest and activity.  Starting on Monday I am okay with her returning to physical activity such as walking, aquatic therapy and other aerobic based exercise.  -Depending on the degree of relief, she could benefit from some formal therapy or home exercises. - Ok for tylenol or OTC-anti inflammatories, ice as needed for post-injection pain - she will follow-up with Dr. Roda Shutters as indicated; I am happy to see them as needed  Madelyn Brunner, DO Primary Care Sports Medicine Physician  [facility] - Orthopedics  This note was dictated using Dragon naturally speaking software and may contain errors in syntax, spelling, or content which have not been identified prior to signing this note.

## 2023-07-04 ENCOUNTER — Encounter: Payer: Self-pay | Admitting: Sports Medicine

## 2023-07-04 ENCOUNTER — Ambulatory Visit: Payer: Medicare HMO | Admitting: Sports Medicine

## 2023-07-04 ENCOUNTER — Telehealth: Payer: Self-pay | Admitting: Sports Medicine

## 2023-07-04 DIAGNOSIS — K219 Gastro-esophageal reflux disease without esophagitis: Secondary | ICD-10-CM | POA: Diagnosis not present

## 2023-07-04 DIAGNOSIS — M25551 Pain in right hip: Secondary | ICD-10-CM | POA: Diagnosis not present

## 2023-07-04 DIAGNOSIS — M1611 Unilateral primary osteoarthritis, right hip: Secondary | ICD-10-CM | POA: Diagnosis not present

## 2023-07-04 DIAGNOSIS — G8929 Other chronic pain: Secondary | ICD-10-CM | POA: Diagnosis not present

## 2023-07-04 DIAGNOSIS — M8589 Other specified disorders of bone density and structure, multiple sites: Secondary | ICD-10-CM

## 2023-07-04 MED ORDER — TRAMADOL HCL 50 MG PO TABS
50.0000 mg | ORAL_TABLET | Freq: Two times a day (BID) | ORAL | 0 refills | Status: DC | PRN
Start: 2023-07-04 — End: 2023-07-17

## 2023-07-04 NOTE — Telephone Encounter (Signed)
Pt called in stating the hip injection she got on the 14 of this month and states the injection did not work at all and she has a follow up this afternoon at 3:45

## 2023-07-04 NOTE — Progress Notes (Signed)
Linda [name] - 82 y.o. female MRN [id number]  Date of birth: 1941-02-14  Office Visit Note: Visit Date: 07/04/2023 PCP: Myrlene Broker, MD Referred by: Myrlene Broker, *  Subjective: Chief Complaint  Patient presents with   Right Hip - Follow-up, Pain   HPI: Linda [name] is a pleasant 82 y.o. female who presents today for follow-up of acute on chronic right hip pain.   Linda [name] is still experiencing rather significant right anterior hip and groin pain.  Her pain in the right hip started back in the spring but has worsened here over the last month or so.  She has done physical therapy for the right hip and the back with [facility], which helped quite a bit.  She has not been any recently.  We did perform an ultrasound-guided intra-articular hip injection on 06/26/2023, she states this helped by about 50-60% but only for 2 to 3 days and then her pain returned and it continues to worsen.  She has pain with getting in and out of the car/bed, flexing the hip and sometimes from sitting to standing.   She is using Tylenol Extra Strength without much relief.  He does have a history of GERD and stomach upset with prior anti-inflammatory/NSAID use.   DEXA scan reviewed from 04/23/2021 -patient diagnosed with osteopenia at multiple sites, highest T-score is -2.1.  Pertinent ROS were reviewed with the patient and found to be negative unless otherwise specified above in HPI.   Assessment & Plan: Visit Diagnoses:  1. Chronic pain of right hip   2. Primary osteoarthritis of right hip   3. Osteopenia of multiple sites   4. Gastroesophageal reflux disease, unspecified whether esophagitis present    Plan: Discussed with Linda [name] that her physical exam and provocative maneuvers point to the hip as being the source of her pain, however I am somewhat puzzled why the US-guided intra-articular hip injection only gave her about 50-60% relief for 2-3 days. She does have moderate hip  osteoarthritis, but also concomitant osteopenia. Given this and her lack of response to IA-injection, I am somewhat concerned for subchondral insufficiency fracture or occult stress reaction to the femur/hip joint. We will move forward with MRI of the left hip to better evaluate this as well as the hip flexors muscles to rule out pathology. She has had chronic hip pain that responded well to physical therapy with [facility] in the past, we will send a new referral for PT for her. Given her pain we discussed a prescription NSAID, but given her GERD and gastric irritation in the past, we will hold on this. Short prescription for Tramadol 50mg  BID PRN was provided until acute pain settles down, this will not be prescribed long-term. She will f/u in 1-week after MRI scan to review and discuss next steps.   Follow-up: Return for f/u 1-week after MRI scan to review .   Meds & Orders:  Meds ordered this encounter  Medications   traMADol (ULTRAM) 50 MG tablet    Sig: Take 1 tablet (50 mg total) by mouth every 12 (twelve) hours as needed.    Dispense:  25 tablet    Refill:  0    Orders Placed This Encounter  Procedures   MR Hip Right w/o contrast   Ambulatory referral to Physical Therapy     Procedures: No procedures performed      Clinical History: No specialty comments available.  She reports that she quit smoking about 34 years ago.  Her smoking use included cigarettes. She has never used smokeless tobacco.  Recent Labs    12/06/22 1111  HGBA1C 5.8    Objective:    Physical Exam  Gen: Well-appearing, in no acute distress; non-toxic CV:  Well-perfused. Warm.  Resp: Breathing unlabored on room air; no wheezing. Psych: Fluid speech in conversation; appropriate affect; normal thought process Neuro: Sensation intact throughout. No gross coordination deficits.   Ortho Exam - Right hip: No bony tenderness or greater trochanteric TTP.  There is no bony restriction with internal or external  logroll, only minimal pain with endrange internal.  Positive FADIR test, positive Stinchfield test.  There is pain with any hip flexion.   Imaging:  - Hip XR 06/24/23: XR HIP UNILAT W OR W/O PELVIS 2-3 VIEWS RIGHT X-rays of the right hip show moderate osteoarthritis with about 50% joint  space narrowing.  No acute abnormalities.  Narrative & Impression  CLINICAL DATA:  Low back pain x2 months   EXAM: LUMBAR SPINE - 2-3 VIEW   COMPARISON:  MRI lumbar spine dated 09/02/2016   FINDINGS: Five lumbar type vertebral bodies.   Normal lumbar lordosis.  Moderate lumbar levoscoliosis.   No evidence of fracture or dislocation. Vertebral body heights are maintained.   Mild degenerative changes of the visualized thoracolumbar spine, most prominent at L3-4.   Visualized bony pelvis appears intact.   IMPRESSION: No fracture or dislocation is seen.   Mild degenerative changes, as above.     Electronically Signed   By: Charline Bills M.D.   On: 10/10/2022 17:05    Past Medical/Family/Surgical/Social History: Medications & Allergies reviewed per EMR, new medications updated. Patient Active Problem List   Diagnosis Date Noted   Spinal stenosis of lumbar region 10/09/2022   Facet arthritis of lumbar region 10/09/2022   Chronic bilateral low back pain without sciatica 10/04/2022   Osteopenia 11/26/2021   Pre-diabetes 11/26/2021   Other fatigue 07/28/2020   Dupuytren's disease of palm 01/19/2018   Viral URI 01/23/2017   Bilateral sensorineural hearing loss 03/12/2016   Routine general medical examination at a health care facility 07/22/2015   Osteoarthritis of both hips resulting from hip dysplasia 01/25/2015   Primary osteoarthritis of right hip 01/25/2015   Anemia 11/06/2012   Anxiety 11/06/2012   Depression 11/06/2012   Cataract, nuclear 10/28/2012   Primary open angle glaucoma of both eyes, indeterminate stage 10/28/2012   Ptosis of eyelid 10/28/2012   Bladder  prolapse, female, acquired 01/09/2012   GERD (gastroesophageal reflux disease) 05/21/2007   Osteoarthritis 05/21/2007   Past Medical History:  Diagnosis Date   ALLERGIC RHINITIS    Allergy    ANEMIA-IRON DEFICIENCY    Anxiety state, unspecified    Carpal tunnel syndrome    pt denies    Cataract    Closed left ankle fracture    COLONIC POLYPS, HX OF 2007   GERD    GLAUCOMA    Glaucoma    GOITER, MULTINODULAR    on [place] 2006, unchanged 6/13 with nodules all <11mm   HIATAL HERNIA WITH REFLUX    Hip dysplasia, acquired    B sx; eval at [facility] for same 01/2015 -ongoing PT   OSTEOARTHRITIS    Osteoporosis    Serrated polyp of colon    TOBACCO USE, QUIT    Family History  Problem Relation Age of Onset   Diabetes Mother    Heart disease Mother    Arthritis Mother    Hypertension Mother  Arthritis Father    Colon cancer Paternal Grandmother    Esophageal cancer Neg Hx    Rectal cancer Neg Hx    Stomach cancer Neg Hx    Past Surgical History:  Procedure Laterality Date   BREAST SURGERY  1960   Left breast cyst removed no complications   CARPAL TUNNEL RELEASE Right    COLONOSCOPY  2005   EYE SURGERY  1991-1992   both eyes   EYE SURGERY Left 2014   shunt behind left eye   ORIF FIBULA FRACTURE Left 08/24/2019   Procedure: OPEN REDUCTION INTERNAL FIXATION (ORIF) LEFT DISTAL FIBULA FRACTURE;  Surgeon: Teryl Lucy, MD;  Location: [facility];  Service: Orthopedics;  Laterality: Left;   Right shoulder aurgery  10/2008   Social History   Occupational History   Not on file  Tobacco Use   Smoking status: Former    Current packs/day: 0.00    Types: Cigarettes    Quit date: 08/12/1988    Years since quitting: 34.9   Smokeless tobacco: Never  Vaping Use   Vaping status: Never Used  Substance and Sexual Activity   Alcohol use: Yes    Alcohol/week: 0.0 standard drinks of alcohol    Comment: occassionally   Drug use: No   Sexual activity: Not Currently    Birth  control/protection: Post-menopausal

## 2023-07-04 NOTE — Progress Notes (Signed)
Patient says that she did not get relief from the injection. She says that she is having Linda [name] pain in the front of the hip when she lifts the right leg and when getting into and out of the car. She says that she was previously having aching in the rest of the leg which has gotten more intense. She is not taking pain medication. She has not used ice or heat.

## 2023-07-16 ENCOUNTER — Telehealth: Payer: Self-pay | Admitting: Orthopaedic Surgery

## 2023-07-16 NOTE — Telephone Encounter (Signed)
Patient called and said that her hip is hurting even worse. She wanted to know if she should keep taking the medication before her MRI on the 31st of December.[phone]

## 2023-07-16 NOTE — Telephone Encounter (Signed)
Ok we'll just stick with tramadol for now.  Advise her to back off on activity.

## 2023-07-16 NOTE — Telephone Encounter (Signed)
Spoke with patient. She is almost out of the tramadol and would like a refill sent to [facility].

## 2023-07-17 ENCOUNTER — Other Ambulatory Visit: Payer: Self-pay | Admitting: Sports Medicine

## 2023-07-17 MED ORDER — TRAMADOL HCL 50 MG PO TABS: 50.0000 mg | ORAL_TABLET | Freq: Two times a day (BID) | ORAL | 0 refills | Status: DC | PRN

## 2023-07-18 NOTE — Telephone Encounter (Signed)
Notified patient.

## 2023-07-28 ENCOUNTER — Telehealth: Payer: Self-pay | Admitting: Sports Medicine

## 2023-07-28 NOTE — Telephone Encounter (Signed)
Pt states that the tramadol is not working and asking if it is any way get her in sooner for her MRI. STAT please. Please call pt at [phone].

## 2023-07-30 ENCOUNTER — Other Ambulatory Visit: Payer: Self-pay | Admitting: Sports Medicine

## 2023-07-30 ENCOUNTER — Telehealth: Payer: Self-pay | Admitting: Sports Medicine

## 2023-07-30 MED ORDER — TRAMADOL HCL 50 MG PO TABS: 50.0000 mg | ORAL_TABLET | Freq: Two times a day (BID) | ORAL | 0 refills | Status: DC | PRN

## 2023-07-30 NOTE — Telephone Encounter (Signed)
Pt called requesting a refill of tramadol. Please send to [facility]. Pt phone number [phone].

## 2023-07-30 NOTE — Telephone Encounter (Signed)
Sent refill order for Tramadol.

## 2023-08-02 ENCOUNTER — Other Ambulatory Visit: Payer: Self-pay | Admitting: Sports Medicine

## 2023-08-04 ENCOUNTER — Other Ambulatory Visit: Payer: Self-pay | Admitting: Sports Medicine

## 2023-08-07 ENCOUNTER — Telehealth: Payer: Self-pay | Admitting: Orthopedic Surgery

## 2023-08-07 MED ORDER — DIAZEPAM 5 MG PO TABS: 5.0000 mg | ORAL_TABLET | Freq: Once | ORAL | 0 refills | Status: DC | PRN

## 2023-08-07 NOTE — Telephone Encounter (Signed)
Linda [name] has an MRI scheduled for Tuesday.  She states that she is extremely claustrophobic and will need to be medicated before the MRI.  Can we please call in Rx to [facility].  Her call back # is [phone]. Thank you!

## 2023-08-07 NOTE — Addendum Note (Signed)
Addended by: Willia Craze on: 08/07/2023 11:59 AM   Modules accepted: Orders

## 2023-08-07 NOTE — Telephone Encounter (Signed)
Patient aware.

## 2023-08-08 ENCOUNTER — Telehealth: Payer: Self-pay | Admitting: Internal Medicine

## 2023-08-08 NOTE — Telephone Encounter (Signed)
Patient called stated she is having nausea and changes in bowel movement for the past 2 weeks. Please advise.

## 2023-08-12 ENCOUNTER — Ambulatory Visit
Admission: RE | Admit: 2023-08-12 | Discharge: 2023-08-12 | Disposition: A | Discharge status: Home / Self Care | Payer: Medicare HMO | Source: Ambulatory Visit | Attending: Sports Medicine

## 2023-08-12 DIAGNOSIS — S76011A Strain of muscle, fascia and tendon of right hip, initial encounter: Secondary | ICD-10-CM | POA: Diagnosis not present

## 2023-08-12 DIAGNOSIS — M1611 Unilateral primary osteoarthritis, right hip: Secondary | ICD-10-CM | POA: Diagnosis not present

## 2023-08-12 NOTE — Telephone Encounter (Signed)
 Pt states she had been out of town and had some issues with nausea and a little diarrhea. She took pepto and has improved. She wanted to know if she should take a probiotic. Discussed with pt that she could try align or florastor as the box states. She knows to call back if she continues to have issues. Also reports she is under a lot of stress that does not help the issue.

## 2023-08-19 ENCOUNTER — Other Ambulatory Visit: Payer: Self-pay | Admitting: Physician Assistant

## 2023-08-19 ENCOUNTER — Other Ambulatory Visit (INDEPENDENT_AMBULATORY_CARE_PROVIDER_SITE_OTHER): Payer: Medicare Other

## 2023-08-19 ENCOUNTER — Ambulatory Visit: Payer: No Typology Code available for payment source | Admitting: Orthopaedic Surgery

## 2023-08-19 DIAGNOSIS — W19XXXA Unspecified fall, initial encounter: Secondary | ICD-10-CM | POA: Diagnosis not present

## 2023-08-19 DIAGNOSIS — M8000XA Age-related osteoporosis with current pathological fracture, unspecified site, initial encounter for fracture: Secondary | ICD-10-CM

## 2023-08-19 DIAGNOSIS — S72001A Fracture of unspecified part of neck of right femur, initial encounter for closed fracture: Secondary | ICD-10-CM | POA: Diagnosis not present

## 2023-08-19 MED ORDER — ASPIRIN 81 MG PO TBEC
81.0000 mg | DELAYED_RELEASE_TABLET | Freq: Two times a day (BID) | ORAL | 0 refills | Status: AC
Start: 2023-08-19 — End: ?

## 2023-08-19 MED ORDER — OXYCODONE-ACETAMINOPHEN 5-325 MG PO TABS: 1.0000 | ORAL_TABLET | Freq: Four times a day (QID) | ORAL | 0 refills | Status: AC | PRN

## 2023-08-19 MED ORDER — METHOCARBAMOL 750 MG PO TABS: 750.0000 mg | ORAL_TABLET | Freq: Two times a day (BID) | ORAL | 2 refills | Status: DC | PRN

## 2023-08-19 MED ORDER — DOCUSATE SODIUM 100 MG PO CAPS: 100.0000 mg | ORAL_CAPSULE | Freq: Every day | ORAL | 2 refills | Status: AC | PRN

## 2023-08-19 MED ORDER — ONDANSETRON HCL 4 MG PO TABS: 4.0000 mg | ORAL_TABLET | Freq: Three times a day (TID) | ORAL | 0 refills | Status: DC | PRN

## 2023-08-19 NOTE — Progress Notes (Signed)
 Linda [name] - 83 y.o. female MRN [id number]  Date of birth: Oct 28, 1940  Office Visit Note: Visit Date: 08/19/2023 PCP: Rollene Almarie LABOR, MD Referred by: Rollene Almarie LABOR, *  Subjective: Chief Complaint  Patient presents with  . Right Hip - Pain   HPI: Linda [name] is a pleasant 83 y.o. female who presents today for follow-up of right hip pain and MRI review.  She is using a cane to ambulate.  Her pain is severe.   Pertinent Surgical Hx:   - She is a non-smoker. - She is not diabetic (last A1c was 5.8) - She has a normal BMI, she is not overweight or obese - DEXA scan reviewed from 04/23/2021 -patient diagnosed with osteopenia at multiple sites, highest T-score is -2.1.   Last vitamin D  Lab Results  Component Value Date   VD25OH 45.91 12/06/2022   Pertinent Review of Systems  All other systems reviewed and are negative.  were reviewed with the patient and found to be negative unless otherwise specified above in HPI.   Assessment & Plan: Visit Diagnoses:  1. Closed fracture of right hip, initial encounter (HCC)   2. Age-related osteoporosis with current pathological fracture, initial encounter   3. Fall, initial encounter     Plan: MRI shows rapid progression of DJD from 2 months ago.  Her x-rays now show bone-on-bone changes.  She has significant and diffuse subchondral edema within the femoral head and neck region that is concerning for impending pathologic fracture.  Based on these findings I have recommended a right total hip arthroplasty as soon as we can.  Risk benefits prognosis reviewed.  Patient may need short-term SNF.  Her sister was present during the encounter today.  Debbie met with the patient today.  Total face to face encounter time was greater than 25 minutes and over half of this time was spent in counseling and/or coordination of care.  Follow-up: No follow-ups on file.   Meds & Orders: No orders of the defined types were placed in  this encounter.   Orders Placed This Encounter  Procedures  . XR Pelvis 1-2 Views  . Amb Referral to Osteoporosis Management      Procedures: No procedures performed      Clinical History: No specialty comments available.  She reports that she quit smoking about 35 years ago. Her smoking use included cigarettes. She has never used smokeless tobacco.  Recent Labs    12/06/22 1111  HGBA1C 5.8    Objective:    Physical Exam  Gen: Well-appearing, in no acute distress; non-toxic CV: Well-perfused. Warm.  Resp: Breathing unlabored on room air; no wheezing. Psych: Fluid speech in conversation; appropriate affect; normal thought process  Ortho Exam - Right hip: Antalgic gait.  Pain with hip range of motion.  Imaging:  XR Pelvis 1-2 Views X-rays of the right hip show rapid progression of joint space narrowing  compared to prior x-rays.  She is now bone-on-bone.  Past Medical/Family/Surgical/Social History: Medications & Allergies reviewed per EMR, new medications updated. Patient Active Problem List   Diagnosis Date Noted  . Spinal stenosis of lumbar region 10/09/2022  . Facet arthritis of lumbar region 10/09/2022  . Chronic bilateral low back pain without sciatica 10/04/2022  . Osteopenia 11/26/2021  . Pre-diabetes 11/26/2021  . Other fatigue 07/28/2020  . Dupuytren's disease of palm 01/19/2018  . Viral URI 01/23/2017  . Bilateral sensorineural hearing loss 03/12/2016  . Routine general medical examination at a health care  facility 07/22/2015  . Osteoarthritis of both hips resulting from hip dysplasia 01/25/2015  . Primary osteoarthritis of right hip 01/25/2015  . Anemia 11/06/2012  . Anxiety 11/06/2012  . Depression 11/06/2012  . Cataract, nuclear 10/28/2012  . Primary open angle glaucoma of both eyes, indeterminate stage 10/28/2012  . Ptosis of eyelid 10/28/2012  . Bladder prolapse, female, acquired 01/09/2012  . GERD (gastroesophageal reflux disease) 05/21/2007   . Osteoarthritis 05/21/2007   Past Medical History:  Diagnosis Date  . ALLERGIC RHINITIS   . Allergy   . ANEMIA-IRON DEFICIENCY   . Anxiety state, unspecified   . Carpal tunnel syndrome    pt denies   . Cataract   . Closed left ankle fracture   . COLONIC POLYPS, HX OF 2007  . GERD   . GLAUCOMA   . Glaucoma   . GOITER, MULTINODULAR    on US  2006, unchanged 6/13 with nodules all <21mm  . HIATAL HERNIA WITH REFLUX   . Hip dysplasia, acquired    B sx; eval at [facility] for same 01/2015 -ongoing PT  . OSTEOARTHRITIS   . Osteoporosis   . Serrated polyp of colon   . TOBACCO USE, QUIT    Family History  Problem Relation Age of Onset  . Diabetes Mother   . Heart disease Mother   . Arthritis Mother   . Hypertension Mother   . Arthritis Father   . Colon cancer Paternal Grandmother   . Esophageal cancer Neg Hx   . Rectal cancer Neg Hx   . Stomach cancer Neg Hx    Past Surgical History:  Procedure Laterality Date  . BREAST SURGERY  1960   Left breast cyst removed no complications  . CARPAL TUNNEL RELEASE Right   . COLONOSCOPY  2005  . EYE SURGERY  1991-1992   both eyes  . EYE SURGERY Left 2014   shunt behind left eye  . ORIF FIBULA FRACTURE Left 08/24/2019   Procedure: OPEN REDUCTION INTERNAL FIXATION (ORIF) LEFT DISTAL FIBULA FRACTURE;  Surgeon: Josefina Chew, MD;  Location: [facility];  Service: Orthopedics;  Laterality: Left;  . Right shoulder aurgery  10/2008   Social History   Occupational History  . Not on file  Tobacco Use  . Smoking status: Former    Current packs/day: 0.00    Types: Cigarettes    Quit date: 08/12/1988    Years since quitting: 35.0  . Smokeless tobacco: Never  Vaping Use  . Vaping status: Never Used  Substance and Sexual Activity  . Alcohol use: Yes    Alcohol/week: 0.0 standard drinks of alcohol    Comment: occassionally  . Drug use: No  . Sexual activity: Not Currently    Birth control/protection: Post-menopausal

## 2023-08-21 ENCOUNTER — Encounter (HOSPITAL_COMMUNITY): Payer: Self-pay | Admitting: Orthopaedic Surgery

## 2023-08-21 ENCOUNTER — Other Ambulatory Visit: Payer: Self-pay

## 2023-08-21 NOTE — Progress Notes (Signed)
 PCP - Rollene Almarie LABOR, MD  Cardiologist -   PPM/ICD - denies Device Orders - n/a Rep Notified - n/a  Chest x-ray - denies EKG - denies Stress Test - denies ECHO - denies Cardiac Cath - denies  CPAP - denies   Dm -denies  Blood Thinner Instructions: denies Aspirin  Instructions: n/a  ERAS Protcol - Clear liquids until 7:45 am  COVID TEST- n/a  Anesthesia review: no  Patient verbally denies any shortness of breath, fever, cough and chest pain during phone call   -------------  SDW INSTRUCTIONS given:  Your procedure is scheduled on January 13. 2025.  Report to [facility] Main Entrance A at 8:15 A.M., and check in at the Admitting office.  Call this number if you have problems the morning of surgery:  [phone]   Remember:  Do not eat after midnight the night before your surgery  You may drink clear liquids until 7:15 the morning of your surgery.   Clear liquids allowed are: Water, Non-Citrus Juices (without pulp), Carbonated Beverages, Clear Tea, Black Coffee Only, and Gatorade    Take these medicines the morning of surgery with A SIP OF WATER  busPIRone  (BUSPAR )  dorzolamide-timolol (COSOPT)  omeprazole  (PRILOSEC)  sertraline  (ZOLOFT )    IF NEEDED methocarbamol  (ROBAXIN -750)  ondansetron  (ZOFRAN )  oxyCODONE -acetaminophen  (PERCOCET)   As of today, STOP taking any Aspirin  (unless otherwise instructed by your surgeon) Aleve, Naproxen, Ibuprofen, Motrin, Advil, Goody's, BC's, all herbal medications, fish oil, and all vitamins.                      Do not wear jewelry, make up, or nail polish            Do not wear lotions, powders, perfumes/colognes, or deodorant.            Do not shave 48 hours prior to surgery.  Men may shave face and neck.            Do not bring valuables to the hospital.            [facility] is not responsible for any belongings or valuables.  Do NOT Smoke (Tobacco/Vaping) 24 hours prior to your procedure If you use a CPAP  at night, you may bring all equipment for your overnight stay.   Contacts, glasses, dentures or bridgework may not be worn into surgery.      For patients admitted to the hospital, discharge time will be determined by your treatment team.   Patients discharged the day of surgery will not be allowed to drive home, and someone needs to stay with them for 24 hours.    Special instructions:   [facility]- Preparing For Surgery  Before surgery, you can play an important role. Because skin is not sterile, your skin needs to be as free of germs as possible. You can reduce the number of germs on your skin by washing with CHG (chlorahexidine gluconate) Soap before surgery.  CHG is an antiseptic cleaner which kills germs and bonds with the skin to continue killing germs even after washing.    Oral Hygiene is also important to reduce your risk of infection.  Remember - BRUSH YOUR TEETH THE MORNING OF SURGERY WITH YOUR REGULAR TOOTHPASTE  Please do not use if you have an allergy to CHG or antibacterial soaps. If your skin becomes reddened/irritated stop using the CHG.  Do not shave (including legs and underarms) for at least 48 hours prior to first  CHG shower. It is OK to shave your face.  Please follow these instructions carefully.   Shower the NIGHT BEFORE SURGERY and the MORNING OF SURGERY with DIAL Soap.   Pat yourself dry with a CLEAN TOWEL.  Wear CLEAN PAJAMAS to bed the night before surgery  Place CLEAN SHEETS on your bed the night of your first shower and DO NOT SLEEP WITH PETS.   Day of Surgery: Please shower morning of surgery  Wear Clean/Comfortable clothing the morning of surgery Do not apply any deodorants/lotions.   Remember to brush your teeth WITH YOUR REGULAR TOOTHPASTE.   Questions were answered. Patient verbalized understanding of instructions.

## 2023-08-24 DIAGNOSIS — M1611 Unilateral primary osteoarthritis, right hip: Secondary | ICD-10-CM | POA: Insufficient documentation

## 2023-08-25 ENCOUNTER — Other Ambulatory Visit: Payer: Self-pay | Admitting: Physician Assistant

## 2023-08-25 ENCOUNTER — Encounter (HOSPITAL_COMMUNITY)
Admission: RE | Disposition: A | Discharge status: Home / Self Care | Payer: Self-pay | Source: Ambulatory Visit | Attending: Orthopaedic Surgery

## 2023-08-25 ENCOUNTER — Encounter (HOSPITAL_COMMUNITY): Payer: Self-pay | Admitting: Orthopaedic Surgery

## 2023-08-25 ENCOUNTER — Observation Stay (HOSPITAL_COMMUNITY)
Admission: RE | Admit: 2023-08-25 | Discharge: 2023-08-27 | Disposition: A | Discharge status: Home / Self Care | Payer: Medicare Other | Source: Ambulatory Visit | Attending: Orthopaedic Surgery | Admitting: Orthopaedic Surgery

## 2023-08-25 ENCOUNTER — Observation Stay (HOSPITAL_COMMUNITY): Payer: Medicare Other | Admitting: Anesthesiology

## 2023-08-25 ENCOUNTER — Ambulatory Visit (HOSPITAL_COMMUNITY): Payer: Medicare Other

## 2023-08-25 ENCOUNTER — Observation Stay (HOSPITAL_COMMUNITY): Payer: Medicare Other

## 2023-08-25 ENCOUNTER — Other Ambulatory Visit: Payer: Self-pay

## 2023-08-25 DIAGNOSIS — M84351A Stress fracture, right femur, initial encounter for fracture: Secondary | ICD-10-CM | POA: Diagnosis not present

## 2023-08-25 DIAGNOSIS — K219 Gastro-esophageal reflux disease without esophagitis: Secondary | ICD-10-CM | POA: Insufficient documentation

## 2023-08-25 DIAGNOSIS — Z87891 Personal history of nicotine dependence: Secondary | ICD-10-CM | POA: Insufficient documentation

## 2023-08-25 DIAGNOSIS — M1990 Unspecified osteoarthritis, unspecified site: Secondary | ICD-10-CM | POA: Diagnosis not present

## 2023-08-25 DIAGNOSIS — F1090 Alcohol use, unspecified, uncomplicated: Secondary | ICD-10-CM | POA: Insufficient documentation

## 2023-08-25 DIAGNOSIS — Z7901 Long term (current) use of anticoagulants: Secondary | ICD-10-CM | POA: Insufficient documentation

## 2023-08-25 DIAGNOSIS — Z96641 Presence of right artificial hip joint: Secondary | ICD-10-CM | POA: Insufficient documentation

## 2023-08-25 DIAGNOSIS — M1611 Unilateral primary osteoarthritis, right hip: Secondary | ICD-10-CM

## 2023-08-25 HISTORY — PX: TOTAL HIP ARTHROPLASTY: SHX124

## 2023-08-25 LAB — CBC
HCT: 42.1 % (ref 36.0–46.0)
Hemoglobin: 13.2 g/dL (ref 12.0–15.0)
MCH: 29.5 pg (ref 26.0–34.0)
MCHC: 31.4 g/dL (ref 30.0–36.0)
MCV: 94 fL (ref 80.0–100.0)
Platelets: 273 10*3/uL (ref 150–400)
RBC: 4.48 MIL/uL (ref 3.87–5.11)
RDW: 13.6 % (ref 11.5–15.5)
WBC: 7.8 10*3/uL (ref 4.0–10.5)
nRBC: 0 % (ref 0.0–0.2)

## 2023-08-25 LAB — SURGICAL PCR SCREEN
MRSA, PCR: NEGATIVE
Staphylococcus aureus: NEGATIVE

## 2023-08-25 LAB — BASIC METABOLIC PANEL
Anion gap: 12 (ref 5–15)
BUN: 12 mg/dL (ref 8–23)
CO2: 23 mmol/L (ref 22–32)
Calcium: 9.2 mg/dL (ref 8.9–10.3)
Chloride: 102 mmol/L (ref 98–111)
Creatinine, Ser: 0.61 mg/dL (ref 0.44–1.00)
GFR, Estimated: >60 mL/min (ref >60)
Glucose, Bld: 75 mg/dL (ref 70–99)
Potassium: 4 mmol/L (ref 3.5–5.1)
Sodium: 137 mmol/L (ref 135–145)

## 2023-08-25 LAB — TYPE AND SCREEN
ABO/RH(D): A POS
Antibody Screen: NEGATIVE

## 2023-08-25 LAB — ABO/RH: ABO/RH(D): A POS

## 2023-08-25 SURGERY — ARTHROPLASTY, HIP, TOTAL, ANTERIOR APPROACH
Anesthesia: Monitor Anesthesia Care | Site: Hip | Laterality: Right

## 2023-08-25 MED ORDER — ONDANSETRON HCL 4 MG/2ML IJ SOLN
4.0000 mg | Freq: Four times a day (QID) | INTRAMUSCULAR | Status: DC | PRN
  Administered 2023-08-25 – 2023-08-27 (×5): 4 mg via INTRAVENOUS
  Filled 2023-08-25 (×5): qty 2

## 2023-08-25 MED ORDER — OXYCODONE HCL 5 MG PO TABS
10.0000 mg | ORAL_TABLET | ORAL | Status: DC | PRN
  Administered 2023-08-26 (×3): 15 mg via ORAL
  Administered 2023-08-26: 10 mg via ORAL
  Administered 2023-08-26 – 2023-08-27 (×3): 15 mg via ORAL
  Filled 2023-08-25: qty 3
  Filled 2023-08-25: qty 2
  Filled 2023-08-25 (×4): qty 3
  Filled 2023-08-25: qty 2

## 2023-08-25 MED ORDER — FERROUS SULFATE 325 (65 FE) MG PO TABS
325.0000 mg | ORAL_TABLET | Freq: Three times a day (TID) | ORAL | Status: DC
Start: 2023-08-25 — End: 2023-08-27
  Administered 2023-08-25 – 2023-08-26 (×4): 325 mg via ORAL
  Filled 2023-08-25 (×5): qty 1

## 2023-08-25 MED ORDER — DEXAMETHASONE SODIUM PHOSPHATE 10 MG/ML IJ SOLN
INTRAMUSCULAR | Status: DC | PRN
  Administered 2023-08-25: 8 mg via INTRAVENOUS

## 2023-08-25 MED ORDER — PRONTOSAN WOUND IRRIGATION OPTIME
TOPICAL | Status: DC | PRN
  Administered 2023-08-25: 350 mL

## 2023-08-25 MED ORDER — METOCLOPRAMIDE HCL 5 MG/ML IJ SOLN
5.0000 mg | Freq: Three times a day (TID) | INTRAMUSCULAR | Status: DC | PRN
Start: 2023-08-25 — End: 2023-08-27
  Administered 2023-08-25: 10 mg via INTRAVENOUS
  Filled 2023-08-25: qty 2

## 2023-08-25 MED ORDER — HYDROMORPHONE HCL 1 MG/ML IJ SOLN
0.5000 mg | INTRAMUSCULAR | Status: DC | PRN
  Administered 2023-08-25: 1 mg via INTRAVENOUS
  Filled 2023-08-25: qty 1

## 2023-08-25 MED ORDER — FENTANYL CITRATE (PF) 100 MCG/2ML IJ SOLN: 25.0000 ug | INTRAMUSCULAR | Status: DC | PRN

## 2023-08-25 MED ORDER — DOCUSATE SODIUM 100 MG PO CAPS
100.0000 mg | ORAL_CAPSULE | Freq: Two times a day (BID) | ORAL | Status: DC
  Administered 2023-08-25 – 2023-08-27 (×3): 100 mg via ORAL
  Filled 2023-08-25 (×4): qty 1

## 2023-08-25 MED ORDER — PHENOL 1.4 % MT LIQD: 1.0000 | OROMUCOSAL | Status: DC | PRN

## 2023-08-25 MED ORDER — PHENYLEPHRINE HCL-NACL 20-0.9 MG/250ML-% IV SOLN
INTRAVENOUS | Status: DC | PRN
  Administered 2023-08-25: 20 ug/min via INTRAVENOUS

## 2023-08-25 MED ORDER — CEFAZOLIN SODIUM-DEXTROSE 2-4 GM/100ML-% IV SOLN
2.0000 g | Freq: Four times a day (QID) | INTRAVENOUS | Status: AC
  Administered 2023-08-25 (×2): 2 g via INTRAVENOUS
  Filled 2023-08-25 (×2): qty 100

## 2023-08-25 MED ORDER — DOCUSATE SODIUM 100 MG PO CAPS
100.0000 mg | ORAL_CAPSULE | Freq: Every day | ORAL | Status: DC | PRN
  Administered 2023-08-26: 100 mg via ORAL

## 2023-08-25 MED ORDER — METHOCARBAMOL 1000 MG/10ML IJ SOLN: 500.0000 mg | Freq: Four times a day (QID) | INTRAMUSCULAR | Status: DC | PRN

## 2023-08-25 MED ORDER — DOXYCYCLINE HYCLATE 100 MG PO TABS: 100.0000 mg | ORAL_TABLET | Freq: Two times a day (BID) | ORAL | 0 refills | Status: DC

## 2023-08-25 MED ORDER — POLYETHYLENE GLYCOL 3350 17 G PO PACK
17.0000 g | PACK | Freq: Every day | ORAL | Status: DC
  Administered 2023-08-26 – 2023-08-27 (×2): 17 g via ORAL
  Filled 2023-08-25 (×2): qty 1

## 2023-08-25 MED ORDER — TRANEXAMIC ACID 1000 MG/10ML IV SOLN
2000.0000 mg | INTRAVENOUS | Status: DC
  Filled 2023-08-25: qty 20

## 2023-08-25 MED ORDER — ONDANSETRON HCL 4 MG PO TABS: 4.0000 mg | ORAL_TABLET | Freq: Four times a day (QID) | ORAL | Status: DC | PRN

## 2023-08-25 MED ORDER — VANCOMYCIN HCL 1 G IV SOLR
INTRAVENOUS | Status: DC | PRN
  Administered 2023-08-25: 1000 mg

## 2023-08-25 MED ORDER — PROPOFOL 500 MG/50ML IV EMUL
INTRAVENOUS | Status: DC | PRN
  Administered 2023-08-25: 60 ug/kg/min via INTRAVENOUS

## 2023-08-25 MED ORDER — SODIUM CHLORIDE 0.9 % IR SOLN
Status: DC | PRN
  Administered 2023-08-25: 1000 mL

## 2023-08-25 MED ORDER — CHLORHEXIDINE GLUCONATE 0.12 % MT SOLN
15.0000 mL | Freq: Once | OROMUCOSAL | Status: AC
  Administered 2023-08-25: 15 mL via OROMUCOSAL
  Filled 2023-08-25: qty 15

## 2023-08-25 MED ORDER — 0.9 % SODIUM CHLORIDE (POUR BTL) OPTIME
TOPICAL | Status: DC | PRN
  Administered 2023-08-25: 1000 mL

## 2023-08-25 MED ORDER — DIPHENHYDRAMINE HCL 12.5 MG/5ML PO ELIX: 25.0000 mg | ORAL_SOLUTION | ORAL | Status: DC | PRN

## 2023-08-25 MED ORDER — METHOCARBAMOL 500 MG PO TABS
500.0000 mg | ORAL_TABLET | Freq: Four times a day (QID) | ORAL | Status: DC | PRN
  Administered 2023-08-25 – 2023-08-27 (×3): 500 mg via ORAL
  Filled 2023-08-25 (×4): qty 1

## 2023-08-25 MED ORDER — BUPIVACAINE-MELOXICAM ER 400-12 MG/14ML IJ SOLN
INTRAMUSCULAR | Status: AC
  Filled 2023-08-25: qty 1

## 2023-08-25 MED ORDER — ACETAMINOPHEN 500 MG PO TABS
1000.0000 mg | ORAL_TABLET | Freq: Once | ORAL | Status: AC
  Administered 2023-08-25: 1000 mg via ORAL
  Filled 2023-08-25: qty 2

## 2023-08-25 MED ORDER — SORBITOL 70 % SOLN
30.0000 mL | Freq: Every day | Status: DC | PRN
Start: 2023-08-25 — End: 2023-08-27

## 2023-08-25 MED ORDER — TRANEXAMIC ACID-NACL 1000-0.7 MG/100ML-% IV SOLN
1000.0000 mg | Freq: Once | INTRAVENOUS | Status: AC
Start: 2023-08-25 — End: 2023-08-25
  Administered 2023-08-25: 1000 mg via INTRAVENOUS
  Filled 2023-08-25: qty 100

## 2023-08-25 MED ORDER — METOCLOPRAMIDE HCL 5 MG PO TABS: 5.0000 mg | ORAL_TABLET | Freq: Three times a day (TID) | ORAL | Status: DC | PRN

## 2023-08-25 MED ORDER — POVIDONE-IODINE 10 % EX SWAB
2.0000 | Freq: Once | CUTANEOUS | Status: AC
Start: 2023-08-25 — End: 2023-08-25
  Administered 2023-08-25: 2 via TOPICAL

## 2023-08-25 MED ORDER — MAGNESIUM CITRATE PO SOLN: 1.0000 | Freq: Once | ORAL | Status: DC | PRN

## 2023-08-25 MED ORDER — ASPIRIN 81 MG PO CHEW
81.0000 mg | CHEWABLE_TABLET | Freq: Two times a day (BID) | ORAL | Status: DC
  Administered 2023-08-25 – 2023-08-27 (×4): 81 mg via ORAL
  Filled 2023-08-25 (×4): qty 1

## 2023-08-25 MED ORDER — TRANEXAMIC ACID 1000 MG/10ML IV SOLN
INTRAVENOUS | Status: DC | PRN
  Administered 2023-08-25: 2000 mg via TOPICAL

## 2023-08-25 MED ORDER — ACETAMINOPHEN 500 MG PO TABS
1000.0000 mg | ORAL_TABLET | Freq: Four times a day (QID) | ORAL | Status: AC
  Administered 2023-08-25 – 2023-08-26 (×3): 1000 mg via ORAL
  Filled 2023-08-25 (×3): qty 2

## 2023-08-25 MED ORDER — BUPIVACAINE-MELOXICAM ER 400-12 MG/14ML IJ SOLN
INTRAMUSCULAR | Status: DC | PRN
  Administered 2023-08-25: 400 mg

## 2023-08-25 MED ORDER — LACTATED RINGERS IV SOLN: INTRAVENOUS | Status: DC

## 2023-08-25 MED ORDER — ONDANSETRON HCL 4 MG/2ML IJ SOLN: 4.0000 mg | Freq: Once | INTRAMUSCULAR | Status: DC | PRN

## 2023-08-25 MED ORDER — ACETAMINOPHEN 325 MG PO TABS
325.0000 mg | ORAL_TABLET | Freq: Four times a day (QID) | ORAL | Status: DC | PRN
  Filled 2023-08-25: qty 2

## 2023-08-25 MED ORDER — DEXAMETHASONE SODIUM PHOSPHATE 10 MG/ML IJ SOLN
10.0000 mg | Freq: Once | INTRAMUSCULAR | Status: AC
  Administered 2023-08-26: 10 mg via INTRAVENOUS
  Filled 2023-08-25: qty 1

## 2023-08-25 MED ORDER — BUPIVACAINE IN DEXTROSE 0.75-8.25 % IT SOLN
INTRATHECAL | Status: DC | PRN
  Administered 2023-08-25: 1.6 mL via INTRATHECAL

## 2023-08-25 MED ORDER — TRANEXAMIC ACID-NACL 1000-0.7 MG/100ML-% IV SOLN
1000.0000 mg | INTRAVENOUS | Status: AC
  Administered 2023-08-25: 1000 mg via INTRAVENOUS
  Filled 2023-08-25: qty 100

## 2023-08-25 MED ORDER — ALUM & MAG HYDROXIDE-SIMETH 200-200-20 MG/5ML PO SUSP
30.0000 mL | ORAL | Status: DC | PRN
Start: 2023-08-25 — End: 2023-08-27

## 2023-08-25 MED ORDER — ONDANSETRON HCL 4 MG/2ML IJ SOLN
INTRAMUSCULAR | Status: DC | PRN
  Administered 2023-08-25: 4 mg via INTRAVENOUS

## 2023-08-25 MED ORDER — PANTOPRAZOLE SODIUM 40 MG PO TBEC
40.0000 mg | DELAYED_RELEASE_TABLET | Freq: Every day | ORAL | Status: DC
  Administered 2023-08-26 – 2023-08-27 (×2): 40 mg via ORAL
  Filled 2023-08-25 (×2): qty 1

## 2023-08-25 MED ORDER — BUSPIRONE HCL 5 MG PO TABS
5.0000 mg | ORAL_TABLET | Freq: Every day | ORAL | Status: DC
  Administered 2023-08-25 – 2023-08-27 (×3): 5 mg via ORAL
  Filled 2023-08-25 (×3): qty 1

## 2023-08-25 MED ORDER — CEFAZOLIN SODIUM-DEXTROSE 2-4 GM/100ML-% IV SOLN
2.0000 g | INTRAVENOUS | Status: AC
Start: 2023-08-25 — End: 2023-08-25
  Administered 2023-08-25: 2 g via INTRAVENOUS
  Filled 2023-08-25: qty 100

## 2023-08-25 MED ORDER — VANCOMYCIN HCL 1000 MG IV SOLR
INTRAVENOUS | Status: AC
  Filled 2023-08-25: qty 20

## 2023-08-25 MED ORDER — ORAL CARE MOUTH RINSE: 15.0000 mL | Freq: Once | OROMUCOSAL | Status: AC

## 2023-08-25 MED ORDER — OXYCODONE HCL 5 MG PO TABS
5.0000 mg | ORAL_TABLET | ORAL | Status: DC | PRN
  Administered 2023-08-25: 5 mg via ORAL
  Filled 2023-08-25 (×2): qty 1

## 2023-08-25 MED ORDER — MENTHOL 3 MG MT LOZG: 1.0000 | LOZENGE | OROMUCOSAL | Status: DC | PRN

## 2023-08-25 SURGICAL SUPPLY — 57 items
BAG COUNTER SPONGE SURGICOUNT (BAG) ×1 IMPLANT
BAG DECANTER FOR FLEXI CONT (MISCELLANEOUS) ×1 IMPLANT
BLADE SAG 18X100X1.27 (BLADE) ×1 IMPLANT
COVER PERINEAL POST (MISCELLANEOUS) ×1 IMPLANT
COVER SURGICAL LIGHT HANDLE (MISCELLANEOUS) ×1 IMPLANT
DERMABOND ADVANCED .7 DNX12 (GAUZE/BANDAGES/DRESSINGS) IMPLANT
DRAPE C-ARM 42X72 X-RAY (DRAPES) ×1 IMPLANT
DRAPE POUCH INSTRU U-SHP 10X18 (DRAPES) ×1 IMPLANT
DRAPE STERI IOBAN 125X83 (DRAPES) ×1 IMPLANT
DRAPE U-SHAPE 47X51 STRL (DRAPES) ×2 IMPLANT
DRSG AQUACEL AG ADV 3.5X10 (GAUZE/BANDAGES/DRESSINGS) ×1 IMPLANT
DURAPREP 26ML APPLICATOR (WOUND CARE) ×2 IMPLANT
ELECT BLADE 4.0 EZ CLEAN MEGAD (MISCELLANEOUS) ×1
ELECT REM PT RETURN 9FT ADLT (ELECTROSURGICAL) ×1
ELECTRODE BLDE 4.0 EZ CLN MEGD (MISCELLANEOUS) ×1 IMPLANT
ELECTRODE REM PT RTRN 9FT ADLT (ELECTROSURGICAL) ×1 IMPLANT
GLOVE BIOGEL PI IND STRL 7.0 (GLOVE) ×2 IMPLANT
GLOVE BIOGEL PI IND STRL 7.5 (GLOVE) ×5 IMPLANT
GLOVE ECLIPSE 7.0 STRL STRAW (GLOVE) ×2 IMPLANT
GLOVE SKINSENSE STRL SZ7.5 (GLOVE) ×1 IMPLANT
GLOVE SURG SYN 7.5 E (GLOVE) ×2
GLOVE SURG SYN 7.5 PF PI (GLOVE) ×2 IMPLANT
GLOVE SURG UNDER POLY LF SZ7 (GLOVE) ×3 IMPLANT
GLOVE SURG UNDER POLY LF SZ7.5 (GLOVE) ×2 IMPLANT
GOWN STRL REUS W/ TWL LRG LVL3 (GOWN DISPOSABLE) IMPLANT
GOWN STRL REUS W/ TWL XL LVL3 (GOWN DISPOSABLE) ×1 IMPLANT
GOWN STRL SURGICAL XL XLNG (GOWN DISPOSABLE) ×1 IMPLANT
GOWN TOGA ZIPPER T7+ PEEL AWAY (MISCELLANEOUS) ×2 IMPLANT
HEAD M SROM 36MM PLUS 1.5 (Hips) IMPLANT
HOOD PEEL AWAY T7 (MISCELLANEOUS) ×1 IMPLANT
IV NS IRRIG 3000ML ARTHROMATIC (IV SOLUTION) ×1 IMPLANT
KIT BASIN OR (CUSTOM PROCEDURE TRAY) ×1 IMPLANT
LINER NEUTRAL 52X36MM PLUS 4 (Liner) IMPLANT
MARKER SKIN DUAL TIP RULER LAB (MISCELLANEOUS) ×1 IMPLANT
NDL SPNL 18GX3.5 QUINCKE PK (NEEDLE) ×1 IMPLANT
NEEDLE SPNL 18GX3.5 QUINCKE PK (NEEDLE) ×1
PACK TOTAL JOINT (CUSTOM PROCEDURE TRAY) ×1 IMPLANT
PACK UNIVERSAL I (CUSTOM PROCEDURE TRAY) ×1 IMPLANT
PIN SECTOR W/GRIP ACE CUP 52MM (Hips) IMPLANT
SCREW 6.5MMX30MM (Screw) IMPLANT
SET HNDPC FAN SPRY TIP SCT (DISPOSABLE) ×1 IMPLANT
SOLUTION PRONTOSAN WOUND 350ML (IRRIGATION / IRRIGATOR) ×1 IMPLANT
SROM M HEAD 36MM PLUS 1.5 (Hips) ×1 IMPLANT
STAPLER VISISTAT 35W (STAPLE) IMPLANT
STEM FEMORAL SZ 5MM STD ACTIS (Stem) IMPLANT
SUT ETHIBOND 2 V 37 (SUTURE) ×1 IMPLANT
SUT ETHILON 2 0 FS 18 (SUTURE) IMPLANT
SUT VIC AB 0 CT1 27XBRD ANBCTR (SUTURE) ×1 IMPLANT
SUT VIC AB 1 CTX36XBRD ANBCTR (SUTURE) ×1 IMPLANT
SUT VIC AB 2-0 CT1 TAPERPNT 27 (SUTURE) ×2 IMPLANT
SYR 50ML LL SCALE MARK (SYRINGE) ×1 IMPLANT
TOWEL GREEN STERILE (TOWEL DISPOSABLE) ×1 IMPLANT
TRAY CATH INTERMITTENT SS 16FR (CATHETERS) IMPLANT
TRAY FOLEY W/BAG SLVR 14FR (SET/KITS/TRAYS/PACK) IMPLANT
TRAY FOLEY W/BAG SLVR 16FR ST (SET/KITS/TRAYS/PACK) IMPLANT
TUBE SUCT ARGYLE STRL (TUBING) ×1 IMPLANT
YANKAUER SUCT BULB TIP NO VENT (SUCTIONS) ×1 IMPLANT

## 2023-08-25 NOTE — Anesthesia Procedure Notes (Signed)
 Spinal  Patient location during procedure: OR Start time: 08/25/2023 10:40 AM End time: 08/25/2023 10:44 AM Reason for block: surgical anesthesia Staffing Performed: anesthesiologist  Anesthesiologist: Merla Almarie HERO, DO Performed by: Merla Almarie HERO, DO Authorized by: Merla Almarie HERO, DO   Preanesthetic Checklist Completed: patient identified, IV checked, risks and benefits discussed, surgical consent, monitors and equipment checked, pre-op evaluation and timeout performed Spinal Block Patient position: sitting Prep: DuraPrep and site prepped and draped Patient monitoring: cardiac monitor, continuous pulse ox and blood pressure Approach: midline Location: L3-4 Injection technique: single-shot Needle Needle type: Pencan  Needle gauge: 24 G Needle length: 9 cm Assessment Sensory level: T6 Events: CSF return Additional Notes Functioning IV was confirmed and monitors were applied. Sterile prep and drape, including hand hygiene and sterile gloves were used. The patient was positioned and the spine was prepped. The skin was anesthetized with lidocaine .  Free flow of clear CSF was obtained prior to injecting local anesthetic into the CSF.  The spinal needle aspirated freely following injection.  The needle was carefully withdrawn.  The patient tolerated the procedure well.   Significant thoracic dextroscoliosis, mild lumbar levoscoliosis

## 2023-08-25 NOTE — H&P (Signed)
 PREOPERATIVE H&P  Chief Complaint: right femoral neck stress fracture, degenerative joint disease  HPI: Linda [name] is a 83 y.o. female who presents for surgical treatment of right femoral neck stress fracture, degenerative joint disease.  She denies any changes in medical history.  Past Surgical History:  Procedure Laterality Date   BREAST SURGERY  1960   Left breast cyst removed no complications   CARPAL TUNNEL RELEASE Right    COLONOSCOPY  2005   EYE SURGERY  1991-1992   both eyes   EYE SURGERY Left 2014   shunt behind left eye   ORIF FIBULA FRACTURE Left 08/24/2019   Procedure: OPEN REDUCTION INTERNAL FIXATION (ORIF) LEFT DISTAL FIBULA FRACTURE;  Surgeon: Josefina Chew, MD;  Location: [facility];  Service: Orthopedics;  Laterality: Left;   Right shoulder aurgery  10/2008   Social History   Socioeconomic History   Marital status: Widowed    Spouse name: Not on file   Number of children: 2   Years of education: Not on file   Highest education level: Bachelor's degree (e.g., BA, AB, BS)  Occupational History   Not on file  Tobacco Use   Smoking status: Former    Current packs/day: 0.00    Types: Cigarettes    Quit date: 08/12/1988    Years since quitting: 35.0   Smokeless tobacco: Never  Vaping Use   Vaping status: Never Used  Substance and Sexual Activity   Alcohol use: Yes    Alcohol/week: 0.0 standard drinks of alcohol    Comment: occassionally   Drug use: No   Sexual activity: Not Currently    Birth control/protection: Post-menopausal  Other Topics Concern   Not on file  Social History Narrative   Married,widowed; lives in a 2-story home   [employer]   Social Drivers of Health   Financial Resource Strain: Low Risk  (12/05/2022)   Overall Financial Resource Strain (CARDIA)    Difficulty of Paying Living Expenses: Not very hard  Food Insecurity: No Food Insecurity (02/17/2023)   Hunger Vital Sign    Worried About Running Out of  Food in the Last Year: Never true    Ran Out of Food in the Last Year: Never true  Transportation Needs: No Transportation Needs (02/17/2023)   PRAPARE - Administrator, Civil Service (Medical): No    Lack of Transportation (Non-Medical): No  Physical Activity: Sufficiently Active (02/17/2023)   Exercise Vital Sign    Days of Exercise per Week: 3 days    Minutes of Exercise per Session: 50 min  Stress: No Stress Concern Present (02/17/2023)   [employer] of Occupational Health - Occupational Stress Questionnaire    Feeling of Stress : Not at all  Social Connections: Moderately Integrated (02/17/2023)   Social Connection and Isolation Panel [NHANES]    Frequency of Communication with Friends and Family: More than three times a week    Frequency of Social Gatherings with Friends and Family: Three times a week    Attends Religious Services: More than 4 times per year    Active Member of Clubs or Organizations: Yes    Attends Banker Meetings: More than 4 times per year    Marital Status: Widowed   Family History  Problem Relation Age of Onset   Diabetes Mother    Heart disease Mother    Arthritis Mother    Hypertension Mother    Arthritis Father    Colon cancer  Paternal Grandmother    Esophageal cancer Neg Hx    Rectal cancer Neg Hx    Stomach cancer Neg Hx    No Known Allergies Prior to Admission medications   Medication Sig Start Date End Date Taking? Authorizing Provider  acetaminophen  (TYLENOL ) 650 MG CR tablet Take 1,300 mg by mouth at bedtime.   Yes [provider]  b complex vitamins tablet Take 1 tablet by mouth daily.   Yes [provider]  busPIRone  (BUSPAR ) 5 MG tablet Take 1 tablet (5 mg total) by mouth 2 (two) times daily as needed. Annual appt due in April must see provider for future refills Patient taking differently: Take 5 mg by mouth daily. Annual appt due in April must see provider for future refills 01/20/23  Yes  Rollene Almarie LABOR, MD  Calcium-Phosphorus-Vitamin D  (CITRACAL +D3 PO) Take 1 tablet by mouth daily. 600mg  +1000 units twice daily   Yes [provider]  dorzolamide-timolol (COSOPT) 22.3-6.8 MG/ML ophthalmic solution Place 1 drop into both eyes 2 (two) times daily.   Yes [provider]  estradiol  (ESTRACE ) 0.1 MG/GM vaginal cream Place 1 Applicatorful vaginally 2 (two) times a week.   Yes [provider]  omeprazole  (PRILOSEC) 40 MG capsule Take 1 capsule (40 mg total) by mouth daily. 03/18/23  Yes Pyrtle, Gordy HERO, MD  sertraline  (ZOLOFT ) 100 MG tablet TAKE ONE TABLET BY MOUTH ONCE DAILY 05/30/23  Yes Rollene Almarie LABOR, MD  triamcinolone  cream (KENALOG ) 0.1 % Apply 1 Application topically daily as needed (dry skin).   Yes [provider]  aspirin  EC 81 MG tablet Take 1 tablet (81 mg total) by mouth 2 (two) times daily. To be taken after surgery to prevent blood clots 08/19/23   Jule Ronal CROME, PA-C  docusate sodium  (COLACE) 100 MG capsule Take 1 capsule (100 mg total) by mouth daily as needed. 08/19/23 08/18/24  Jule Ronal CROME, PA-C  methocarbamol  (ROBAXIN -750) 750 MG tablet Take 1 tablet (750 mg total) by mouth 2 (two) times daily as needed for muscle spasms. 08/19/23   Jule Ronal CROME, PA-C  ondansetron  (ZOFRAN ) 4 MG tablet Take 1 tablet (4 mg total) by mouth every 8 (eight) hours as needed for nausea or vomiting. 08/19/23   Jule Ronal CROME, PA-C  oxyCODONE -acetaminophen  (PERCOCET) 5-325 MG tablet Take 1-2 tablets by mouth every 6 (six) hours as needed. 08/19/23   Jule Ronal CROME, PA-C  traMADol  (ULTRAM ) 50 MG tablet Take 1 tablet (50 mg total) by mouth every 12 (twelve) hours as needed. Patient not taking: Reported on 08/19/2023 08/04/23   Burnetta Brunet, DO     Positive ROS: All other systems have been reviewed and were otherwise negative with the exception of those mentioned in the HPI and as above.  Physical Exam: General: Alert, no acute  distress Cardiovascular: No pedal edema Respiratory: No cyanosis, no use of accessory musculature GI: abdomen soft Skin: No lesions in the area of chief complaint Neurologic: Sensation intact distally Psychiatric: Patient is competent for consent with normal mood and affect Lymphatic: no lymphedema  MUSCULOSKELETAL: exam stable  Assessment: right femoral neck stress fracture, degenerative joint disease  Plan: Plan for Procedure(s): RIGHT TOTAL HIP ARTHROPLASTY ANTERIOR APPROACH  The risks benefits and alternatives were discussed with the patient including but not limited to the risks of nonoperative treatment, versus surgical intervention including infection, bleeding, nerve injury,  blood clots, cardiopulmonary complications, morbidity, mortality, among others, and they were willing to proceed.   Ozell Cummins, MD  08/25/2023 8:44 AM

## 2023-08-25 NOTE — Progress Notes (Addendum)
 Transition of Care [facility]) - Inpatient Brief Assessment   Patient Details  Name: NYEMA HACHEY MRN: [id number] Date of Birth: [birth year]  Transition of Care [facility] CM/SW Contact:    Rosaline JONELLE Joe, RN Phone Number: 08/25/2023, 2:22 PM   Clinical Narrative: Cm met with the patient at the bedside to discuss TOC needs to return home when cleared by surgery.  Patient is S/P right hip surgery and is currently pending evaluation by PT/OT.  The patient was provided with [facility] letter at the bedside after signing.  Patient plans to return home with daughter's care when she is cleared by MD.  Patient has RW. Cane Triangular angled RW, Shower bench.  Patient states that she will need a 3:1.  Order will be placed.  I offered Medicare choice regarding DME company and patient did not have preference.  I called [employer] and requested 3:1 be delivered to the hospital room today.  Patient states that she was offered Medicare choice by the office and [facility] home health was already set up.  HH orders will be placed for PT/OT.  Patient will be discharged home when stable by surgery.   Transition of Care Asessment: Insurance and Status: (P) Insurance coverage has been reviewed Patient has primary care physician: (P) Yes Home environment has been reviewed: (P) from home alone Prior level of function:: (P) Independent Prior/Current Home Services: (P) No current home services Social Drivers of Health Review: (P) SDOH reviewed interventions complete Readmission risk has been reviewed: (P) Yes Transition of care needs: (P) transition of care needs identified, TOC will continue to follow

## 2023-08-25 NOTE — Evaluation (Signed)
 Physical Therapy Evaluation Patient Details Name: Linda [name] MRN: [id number] DOB: 10-27-1940 Today's Date: 08/25/2023  History of Present Illness  83 y.o. female presents to [facility] hospital on 08/25/2023 for elective R THA. PMH includes GERD, bladder prolapse, anxiety, bilateral sensorineural hearing loss, cataract, depression, osteopenia, prediabetes.  83 y.o. female presents to [facility] hospital on 08/25/2023 for elective R THA. PMH includes GERD, bladder prolapse, anxiety, bilateral sensorineural hearing loss, cataract, depression, osteopenia, prediabetes.  Clinical Impression  Pt presents to PT with deficits in strength, power, gait, balance, functional mobility, endurance. Pt mobilizes RLE well at bed level, reaching the edge of bed without assistance. RN provides IV pain medication with patient in sitting and patient later becomes dizzy. Despite dizziness pt does tolerate standing and stepping at the edge of bed, to initiate weightbearing through RLE. PT will follow up tomorrow in an effort to progress mobility and to return to independence.        If plan is discharge home, recommend the following: A little help with walking and/or transfers;A little help with bathing/dressing/bathroom;Assistance with cooking/housework;Assist for transportation   Can travel by private vehicle        Equipment Recommendations Rolling walker (2 wheels) (TBD patient may be able to utilize her 3 wheeled walker pending progression tomorrow)  Recommendations for Other Services       Functional Status Assessment Patient has had a recent decline in their functional status and demonstrates the ability to make significant improvements in function in a reasonable and predictable amount of time.     Precautions / Restrictions Precautions Precautions: Fall Precaution Comments: direct anterior THA Restrictions Weight Bearing Restrictions Per Provider Order: Yes RLE Weight Bearing Per Provider Order: Weight bearing  as tolerated      Mobility  Bed Mobility Overal bed mobility: Needs Assistance Bed Mobility: Supine to Sit, Sit to Supine     Supine to sit: Supervision Sit to supine: Min assist        Transfers Overall transfer level: Needs assistance Equipment used: Rolling walker (2 wheels) Transfers: Sit to/from Stand Sit to Stand: Min assist                Ambulation/Gait Ambulation/Gait assistance: Contact guard assist Gait Distance (Feet): 2 Feet Assistive device: Rolling walker (2 wheels) Gait Pattern/deviations: Step-to pattern Gait velocity: reduced Gait velocity interpretation: <1.31 ft/sec, indicative of household ambulator   General Gait Details: pt side steps at edge of bed to left side, ambulation tolerance limited by dizziness, pt recently received IV pain medications during session  Stairs            Wheelchair Mobility     Tilt Bed    Modified Rankin (Stroke Patients Only)       Balance Overall balance assessment: Needs assistance Sitting-balance support: No upper extremity supported, Feet supported Sitting balance-Leahy Scale: Good     Standing balance support: Bilateral upper extremity supported, Reliant on assistive device for balance Standing balance-Leahy Scale: Poor                               Pertinent Vitals/Pain Pain Assessment Pain Assessment: 0-10 Pain Score: 8  Pain Location: R hip Pain Descriptors / Indicators: Aching Pain Intervention(s): RN gave pain meds during session    Home Living Family/patient expects to be discharged to:: Private residence Living Arrangements: Alone Available Help at Discharge: Family;Available 24 hours/day Type of Home: House Home Access: Stairs to enter  Entrance Stairs-Rails: None Entrance Stairs-Number of Steps: 2   Home Layout: Able to live on main level with bedroom/bathroom Home Equipment: Cane - single point;Other (comment);Shower seat (3 wheeled walker)      Prior  Function Prior Level of Function : Independent/Modified Independent             Mobility Comments: ambulatory with 3 wheeled walker recently due to hip pain, was utilizing a SPC prior       Extremity/Trunk Assessment   Upper Extremity Assessment Upper Extremity Assessment: Overall WFL for tasks assessed    Lower Extremity Assessment Lower Extremity Assessment: RLE deficits/detail RLE Deficits / Details: generalized post-op weakness    Cervical / Trunk Assessment Cervical / Trunk Assessment: Normal  Communication   Communication Communication: No apparent difficulties Cueing Techniques: Verbal cues  Cognition Arousal: Alert Behavior During Therapy: WFL for tasks assessed/performed Overall Cognitive Status: Within Functional Limits for tasks assessed                                          General Comments General comments (skin integrity, edema, etc.): VSS on RA    Exercises Other Exercises Other Exercises: PT provides education on surgical hip exercise packet   Assessment/Plan    PT Assessment Patient needs continued PT services  PT Problem List Decreased strength;Decreased activity tolerance;Decreased balance;Decreased mobility;Pain;Decreased knowledge of use of DME       PT Treatment Interventions DME instruction;Gait training;Stair training;Functional mobility training;Therapeutic activities;Therapeutic exercise;Balance training;Neuromuscular re-education;Patient/family education    PT Goals (Current goals can be found in the Care Plan section)  Acute Rehab PT Goals Patient Stated Goal: to return to independence PT Goal Formulation: With patient Time For Goal Achievement: 08/29/23 Potential to Achieve Goals: Good    Frequency Min 1X/week     Co-evaluation               AM-PAC PT 6 Clicks Mobility  Outcome Measure Help needed turning from your back to your side while in a flat bed without using bedrails?: A Little Help  needed moving from lying on your back to sitting on the side of a flat bed without using bedrails?: A Little Help needed moving to and from a bed to a chair (including a wheelchair)?: A Little Help needed standing up from a chair using your arms (e.g., wheelchair or bedside chair)?: A Little Help needed to walk in hospital room?: Total Help needed climbing 3-5 steps with a railing? : Total 6 Click Score: 14    End of Session Equipment Utilized During Treatment: Gait belt Activity Tolerance: Other (comment) (limited by dizziness, possibly related to recent IV dilaudid ) Patient left: in bed;with call bell/phone within reach;with bed alarm set;with family/visitor present Nurse Communication: Mobility status PT Visit Diagnosis: Other abnormalities of gait and mobility (R26.89);Muscle weakness (generalized) (M62.81)    Time: 1533-1610 PT Time Calculation (min) (ACUTE ONLY): 37 min   Charges:   PT Evaluation $PT Eval Low Complexity: 1 Low   PT General Charges $$ ACUTE PT VISIT: 1 Visit         Bernardino JINNY Ruth, PT, DPT Acute Rehabilitation Office [phone]   Bernardino JINNY Ruth 08/25/2023, 4:26 PM

## 2023-08-25 NOTE — Discharge Instructions (Signed)

## 2023-08-25 NOTE — Anesthesia Preprocedure Evaluation (Addendum)
 Anesthesia Evaluation  Patient identified by MRN, date of birth, ID band Patient awake    Reviewed: Allergy & Precautions, NPO status , Patient's Chart, lab work & pertinent test results  Airway Mallampati: III  TM Distance: >3 FB Neck ROM: Full    Dental  (+) Teeth Intact, Dental Advisory Given   Pulmonary former smoker Quit smoking 1990   Pulmonary exam normal breath sounds clear to auscultation       Cardiovascular negative cardio ROS Normal cardiovascular exam Rhythm:Regular Rate:Normal     Neuro/Psych  PSYCHIATRIC DISORDERS Anxiety Depression    negative neurological ROS     GI/Hepatic Neg liver ROS,GERD  Medicated and Controlled,,  Endo/Other  negative endocrine ROS    Renal/GU negative Renal ROS  negative genitourinary   Musculoskeletal  (+) Arthritis , Osteoarthritis,  R hip fx   Abdominal   Peds  Hematology   Anesthesia Other Findings   Reproductive/Obstetrics negative OB ROS                             Anesthesia Physical Anesthesia Plan  ASA: 2  Anesthesia Plan: Spinal and MAC   Post-op Pain Management: Tylenol  PO (pre-op)*   Induction:   PONV Risk Score and Plan: 2 and Propofol  infusion and TIVA  Airway Management Planned: Natural Airway and Nasal Cannula  Additional Equipment: None  Intra-op Plan:   Post-operative Plan:   Informed Consent: I have reviewed the patients History and Physical, chart, labs and discussed the procedure including the risks, benefits and alternatives for the proposed anesthesia with the patient or authorized representative who has indicated his/her understanding and acceptance.       Plan Discussed with: CRNA  Anesthesia Plan Comments:        Anesthesia Quick Evaluation

## 2023-08-25 NOTE — Anesthesia Postprocedure Evaluation (Signed)
 Anesthesia Post Note  Patient: Linda [name]  Procedure(s) Performed: RIGHT TOTAL HIP REPLACEMENT (Right: Hip)     Patient location during evaluation: PACU Anesthesia Type: MAC and Spinal Level of consciousness: awake and alert and oriented Pain management: pain level controlled Vital Signs Assessment: post-procedure vital signs reviewed and stable Respiratory status: spontaneous breathing, nonlabored ventilation and respiratory function stable Cardiovascular status: blood pressure returned to baseline and stable Postop Assessment: no headache, no backache and spinal receding Anesthetic complications: no   No notable events documented.  Last Vitals:  Vitals:   08/25/23 1234 08/25/23 1245  BP: (!) 113/52 110/71  Pulse: 69 65  Resp: 10 16  Temp: 36.6 C   SpO2: 98% 93%    Last Pain:  Vitals:   08/25/23 1245  TempSrc:   PainSc: 0-No pain                 Almarie CHRISTELLA Marchi

## 2023-08-25 NOTE — Progress Notes (Signed)
 Notified MD after hours about IV being infiltrated. Pharmacy also aware. Pharmacy stated to place withy ice.

## 2023-08-25 NOTE — Op Note (Signed)
 RIGHT TOTAL HIP REPLACEMENT  Procedure Note Linda [name]   [id number]  Pre-op Diagnosis: right femoral neck stress fracture, degenerative joint disease     Post-op Diagnosis: same  Operative Findings Complete loss of articular cartilage femoral head Superolateral wear of acetabulum Leg length discrepancy   Operative Procedures  1. Total hip replacement; Right hip; uncemented [id number]   Surgeon: Kay Cummins, M.D.  Assist: Ronal [name], PA-C   Anesthesia: spinal  Prosthesis: Depuy Acetabulum: Pinnacle 52 mm Femur: Actis 5 STD Head: 36 mm size: +1.5 Liner: +4 Bearing Type: metal/poly  Total Hip Arthroplasty (Anterior Approach) Op Note:  After informed consent was obtained and the operative extremity marked in the holding area, the patient was brought back to the operating room and placed supine on the HANA table. Next, the operative extremity was prepped and draped in normal sterile fashion. Surgical timeout occurred verifying patient identification, surgical site, surgical procedure and administration of antibiotics.  A bikini type incision was made over the TFL muscle belly.  A Hueter approach to the hip was performed, using the interval between tensor fascia lata and sartorius.  Dissection was carried bluntly down onto the anterior hip capsule. The lateral femoral circumflex vessels were identified and coagulated. A capsulotomy was performed and the capsular flaps tagged for later repair.  The neck osteotomy was performed. The femoral head was removed which showed delaminated cartilage and loose bodies within the hip joint, the acetabular rim was cleared of soft tissue and osteophytes and attention was turned to reaming the acetabulum.  Sequential reaming was performed under fluoroscopic guidance down to the floor of the cotyloid fossa. We reamed to a size 51 mm, and then impacted the acetabular shell. A 30 mm cancellous screw was placed to secure the shell.  The liner  was then placed after irrigation and attention turned to the femur.  After placing the femoral hook, the leg was taken to externally rotated, extended and adducted position taking care to perform soft tissue releases to allow for adequate mobilization of the femur. Soft tissue was cleared from the shoulder of the greater trochanter and the hook elevator used to improve exposure of the proximal femur. Sequential broaching performed up to a size 5. Trial neck and head were placed. The leg was brought back up to neutral and the construct reduced.  The position and sizing of components, offset and leg lengths were checked using fluoroscopy. Stability of the construct was checked in 45 degrees of hip extension and 90 degrees of external rotation without any subluxation, shuck or impingement of prosthesis. We dislocated the prosthesis, dropped the leg back into position, removed trial components, and irrigated copiously. The final stem and head was then placed, the leg brought back up, the system reduced and fluoroscopy used to verify positioning.  Antibiotic irrigation was placed in the surgical wound.   We irrigated, obtained hemostasis and closed the capsule using #2 ethibond suture.  A topical mixture of 0.25% bupivacaine  and meloxicam  was placed deep to the fascia.  One gram of vancomycin  powder was placed in the surgical bed.   One gram of topical tranexamic acid  was injected into the joint.  The fascia was closed with #1 vicryl plus, the deep fat layer was closed with 0 vicryl, the subcutaneous layers closed with 2.0 Vicryl Plus and the skin closed with 2.0 nylon and dermabond. A sterile dressing was applied. The patient was awakened in the operating room and taken to recovery in stable condition.  All sponge, needle, and instrument counts were correct at the end of the case.   [name], my PA, was a medical necessity for opening, closing, limb positioning, retracting, exposing, and overall  facilitation and timely completion of the surgery.  Position: supine  Complications: see description of procedure.  Time Out: performed   Drains/Packing: none  Estimated blood loss: see anesthesia record  Returned to Recovery Room: in good condition.   Antibiotics: yes   Mechanical VTE (DVT) Prophylaxis: sequential compression devices, TED thigh-high  Chemical VTE (DVT) Prophylaxis: aspirin    Fluid Replacement: see anesthesia record  Specimens Removed: 1 to pathology   Sponge and Instrument Count Correct? yes   PACU: portable radiograph - low AP   Plan/RTC: Return in 2 weeks for staple removal. Weight Bearing/Load Lower Extremity: full  Hip precautions: none Suture Removal: 2 weeks   N. Ozell Cummins, MD [facility] 12:05 PM   Implant Name Type Inv. Item Serial No. Manufacturer Lot No. LRB No. Used Action  PIN SECTOR W/GRIP ACE CUP - [id number] Hips PIN SECTOR W/GRIP ACE CUP  [employer] [id number] Right 1 Implanted  LINER NEUTRAL 52X36MM PLUS 4 - [id number] Liner LINER NEUTRAL 52X36MM PLUS 4  [employer] [id number] Right 1 Implanted  SCREW 6.5MMX30MM - [id number] Screw SCREW 6.5MMX30MM  [employer] [id number] Right 1 Implanted  SROM M HEAD PLUS 1.5 - [id number] Hips SROM M HEAD PLUS 1.5  [employer] [id number] Right 1 Implanted  STEM FEMORAL SZ STD ACTIS - [id number] Stem STEM FEMORAL SZ STD ACTIS  [employer] [id number] Right 1 Implanted

## 2023-08-25 NOTE — Transfer of Care (Signed)
 Immediate Anesthesia Transfer of Care Note  Patient: Linda [name]  Procedure(s) Performed: RIGHT TOTAL HIP REPLACEMENT (Right: Hip)  Patient Location: PACU  Anesthesia Type:MAC and Spinal  Level of Consciousness: drowsy  Airway & Oxygen Therapy: Patient Spontanous Breathing  Post-op Assessment: Report given to RN  Post vital signs: Reviewed and stable  Last Vitals:  Vitals Value Taken Time  BP 113/52 08/25/23 1234  Temp 36.6 C 08/25/23 1234  Pulse 66 08/25/23 1244  Resp 18 08/25/23 1244  SpO2 97 % 08/25/23 1244  Vitals shown include unfiled device data.  Last Pain:  Vitals:   08/25/23 1240  TempSrc:   PainSc: 0-No pain         Complications: No notable events documented.

## 2023-08-25 NOTE — Care Management Obs Status (Cosign Needed)
 MEDICARE OBSERVATION STATUS NOTIFICATION   Patient Details  Name: TEMARI SCHOOLER MRN: [id number] Date of Birth: [birth year]   Medicare Observation Status Notification Given:  Yes    Janae Bridgeman, RN 08/25/2023, 2:09 PM

## 2023-08-25 NOTE — Progress Notes (Unsigned)
 doxy

## 2023-08-26 ENCOUNTER — Encounter (HOSPITAL_COMMUNITY): Payer: Self-pay | Admitting: Orthopaedic Surgery

## 2023-08-26 DIAGNOSIS — M84351A Stress fracture, right femur, initial encounter for fracture: Secondary | ICD-10-CM | POA: Diagnosis not present

## 2023-08-26 LAB — GLUCOSE, CAPILLARY: Glucose-Capillary: 132 mg/dL — ABNORMAL HIGH (ref 70–99)

## 2023-08-26 NOTE — Plan of Care (Signed)
 Daughter at bedside. Pt alert x4, pt/ot, plan- possibly home tomorrow

## 2023-08-26 NOTE — Progress Notes (Signed)
 Physical Therapy Treatment Patient Details Name: Linda [name] MRN: [id number] DOB: March 29, 1941 Today's Date: 08/26/2023   History of Present Illness 83 y.o. female presents to [facility] hospital on 08/25/2023 for elective R THA. PMH includes GERD, bladder prolapse, anxiety, bilateral sensorineural hearing loss, cataract, depression, osteopenia, prediabetes.  83 y.o. female presents to [facility] hospital on 08/25/2023 for elective R THA. PMH includes GERD, bladder prolapse, anxiety, bilateral sensorineural hearing loss, cataract, depression, osteopenia, prediabetes.    PT Comments  Pt remains limited by nausea and vomiting during PT, although she is able to increase ambulation distance at this time. Pt's RLE continues to gain strength, with every area of functional mobility becoming more efficient. Pt reports she has been unable to eat a full meal during this admission. PT encourages the pt again to attempt to eat as multiple medications on an empty stomach may be causing her nausea. PT will follow up tomorrow for further progression of mobility.    If plan is discharge home, recommend the following: A little help with walking and/or transfers;A little help with bathing/dressing/bathroom;Assistance with cooking/housework;Assist for transportation   Can travel by private vehicle        Equipment Recommendations  BSC/3in1    Recommendations for Other Services       Precautions / Restrictions Precautions Precautions: Fall Precaution Comments: direct anterior THA Restrictions Weight Bearing Restrictions Per Provider Order: Yes RLE Weight Bearing Per Provider Order: Weight bearing as tolerated     Mobility  Bed Mobility Overal bed mobility: Needs Assistance Bed Mobility: Supine to Sit, Sit to Supine     Supine to sit: Supervision, HOB elevated Sit to supine: Contact guard assist, HOB elevated   General bed mobility comments: increased time    Transfers Overall transfer level: Needs  assistance Equipment used: Rolling walker (2 wheels) Transfers: Sit to/from Stand Sit to Stand: Contact guard assist   Step pivot transfers: Contact guard assist       General transfer comment: slowed step-to transfer from bed to adjacent recliner    Ambulation/Gait Ambulation/Gait assistance: Contact guard assist Gait Distance (Feet): 30 Feet Assistive device: Rolling walker (2 wheels) Gait Pattern/deviations: Step-through pattern Gait velocity: reduced Gait velocity interpretation: <1.8 ft/sec, indicate of risk for recurrent falls   General Gait Details: slowed step-through gait, reduced stance time on RLE. Brief stoppage during gait 2/2 pt vomiting   Stairs             Wheelchair Mobility     Tilt Bed    Modified Rankin (Stroke Patients Only)       Balance Overall balance assessment: Needs assistance Sitting-balance support: No upper extremity supported, Feet supported Sitting balance-Leahy Scale: Good     Standing balance support: Single extremity supported, Reliant on assistive device for balance Standing balance-Leahy Scale: Poor                              Cognition Arousal: Alert Behavior During Therapy: WFL for tasks assessed/performed Overall Cognitive Status: Within Functional Limits for tasks assessed                                 General Comments: anxious related to nausea and dizziness        Exercises General Exercises - Lower Extremity Ankle Circles/Pumps: AROM, Both, 5 reps Quad Sets: AROM, Right, 5 reps Short Arc Quad: AROM, Right, 5 reps  Heel Slides: AROM, Right, 5 reps Hip ABduction/ADduction: AROM, Both, 5 reps Straight Leg Raises: AROM, Right    General Comments General comments (skin integrity, edema, etc.): VSS on RA, BP recorded in vitals flowsheet at 16:16 and 16:17      Pertinent Vitals/Pain Pain Assessment Pain Assessment: Faces Faces Pain Scale: Hurts little more Pain Location: R  hip Pain Descriptors / Indicators: Sore Pain Intervention(s): Monitored during session    Home Living                          Prior Function            PT Goals (current goals can now be found in the care plan section) Acute Rehab PT Goals Patient Stated Goal: to return to independence Progress towards PT goals: Progressing toward goals    Frequency    Min 1X/week      PT Plan      Co-evaluation              AM-PAC PT 6 Clicks Mobility   Outcome Measure  Help needed turning from your back to your side while in a flat bed without using bedrails?: A Little Help needed moving from lying on your back to sitting on the side of a flat bed without using bedrails?: A Little Help needed moving to and from a bed to a chair (including a wheelchair)?: A Little Help needed standing up from a chair using your arms (e.g., wheelchair or bedside chair)?: A Little Help needed to walk in hospital room?: A Little Help needed climbing 3-5 steps with a railing? : Total 6 Click Score: 16    End of Session Equipment Utilized During Treatment: Gait belt Activity Tolerance: Patient tolerated treatment well Patient left: in bed;with bed alarm set;with call bell/phone within reach;with family/visitor present Nurse Communication: Mobility status PT Visit Diagnosis: Other abnormalities of gait and mobility (R26.89);Muscle weakness (generalized) (M62.81)     Time: 8388-8360 PT Time Calculation (min) (ACUTE ONLY): 28 min  Charges:    $Gait Training: 8-22 mins $Therapeutic Exercise: 8-22 mins PT General Charges $$ ACUTE PT VISIT: 1 Visit                     Bernardino JINNY Ruth, PT, DPT Acute Rehabilitation Office [phone]    Bernardino JINNY Ruth 08/26/2023, 4:47 PM

## 2023-08-26 NOTE — Progress Notes (Signed)
 Subjective: 1 Day Post-Op Procedure(s) (LRB): RIGHT TOTAL HIP REPLACEMENT (Right) Patient reports pain as moderate.    Objective: Vital signs in last 24 hours: Temp:  [97.8 F (36.6 C)-98.8 F (37.1 C)] 97.9 F (36.6 C) (01/14 0802) Pulse Rate:  [63-74] 72 (01/14 0802) Resp:  [10-19] 18 (01/14 0528) BP: (110-180)/(51-74) 112/51 (01/14 0802) SpO2:  [90 %-100 %] 90 % (01/14 0802) Weight:  [59 kg] 59 kg (01/13 0839)  Intake/Output from previous day: 01/13 0701 - 01/14 0700 In: 306 [I.V.:106; IV Piggyback:200] Out: 450 [Urine:350; Blood:100] Intake/Output this shift: No intake/output data recorded.  Recent Labs    08/25/23 0850  HGB 13.2   Recent Labs    08/25/23 0850  WBC 7.8  RBC 4.48  HCT 42.1  PLT 273   Recent Labs    08/25/23 0850  NA 137  K 4.0  CL 102  CO2 23  BUN 12  CREATININE 0.61  GLUCOSE 75  CALCIUM 9.2   No results for input(s): LABPT, INR in the last 72 hours.  Neurologically intact Neurovascular intact Sensation intact distally Intact pulses distally Dorsiflexion/Plantar flexion intact Incision: dressing C/D/I No cellulitis present Compartment soft   Assessment/Plan: 1 Day Post-Op Procedure(s) (LRB): RIGHT TOTAL HIP REPLACEMENT (Right) Advance diet Up with therapy D/C IV fluids Discharge home with home health once cleared by PT and able to urinate on her own (likely tomorrow) WBAT RLE Please remove foley catheter this am      Ronal LITTIE Grave 08/26/2023, 8:16 AM

## 2023-08-26 NOTE — Progress Notes (Signed)
 Foley removed by this Clinical research associate. Patient tolerated well. Pt sitting in chair comfortably with call light in reach and verbalizes understand to hit call light when she needs restroom assistance.

## 2023-08-26 NOTE — Progress Notes (Signed)
 Physical Therapy Treatment Patient Details Name: Linda [name] MRN: [id number] DOB: August 19, 1940 Today's Date: 08/26/2023   History of Present Illness 83 y.o. female presents to [facility] hospital on 08/25/2023 for elective R THA. PMH includes GERD, bladder prolapse, anxiety, bilateral sensorineural hearing loss, cataract, depression, osteopenia, prediabetes.  83 y.o. female presents to [facility] hospital on 08/25/2023 for elective R THA. PMH includes GERD, bladder prolapse, anxiety, bilateral sensorineural hearing loss, cataract, depression, osteopenia, prediabetes.    PT Comments  Pt remains limited by reports of dizziness this morning. Of note, the pt has not received and pay medications for at least 4 hours, so likelihood of these medications contributing to her symptoms this morning seems less likely. The pt does report poor PO intake overnight, with vomiting after PT session yesterday and more vomiting this morning. PT encourages continued attempts at eating/drinking to aide in settling the patient's stomach. Pt does transfer from bed to recliner with strong encouragement from PT. PT provides reinforcement for heel slides and hip abduction/adduction to aide in improved activation of R hip musculature. PT will continue to follow.    If plan is discharge home, recommend the following: A little help with walking and/or transfers;A little help with bathing/dressing/bathroom;Assistance with cooking/housework;Assist for transportation   Can travel by private vehicle        Equipment Recommendations  BSC/3in1    Recommendations for Other Services       Precautions / Restrictions Precautions Precautions: Fall Precaution Comments: direct anterior THA Restrictions Weight Bearing Restrictions Per Provider Order: Yes RLE Weight Bearing Per Provider Order: Weight bearing as tolerated     Mobility  Bed Mobility Overal bed mobility: Needs Assistance Bed Mobility: Supine to Sit     Supine to sit: Contact  guard, HOB elevated     General bed mobility comments: increased time    Transfers Overall transfer level: Needs assistance Equipment used: Rolling walker (2 wheels) Transfers: Sit to/from Stand, Bed to chair/wheelchair/BSC Sit to Stand: Contact guard assist   Step pivot transfers: Contact guard assist       General transfer comment: slowed step-to transfer from bed to adjacent recliner    Ambulation/Gait Ambulation/Gait assistance:  (pt declines attempts at ambulation due to reports of dizziness in standing, also reports feeling diaphoretic. PT does not note any sweat although the pt's room is very warm)                 Stairs             Wheelchair Mobility     Tilt Bed    Modified Rankin (Stroke Patients Only)       Balance Overall balance assessment: Needs assistance Sitting-balance support: No upper extremity supported, Feet supported Sitting balance-Leahy Scale: Good     Standing balance support: Bilateral upper extremity supported, Reliant on assistive device for balance Standing balance-Leahy Scale: Poor                              Cognition Arousal: Alert Behavior During Therapy: Anxious Overall Cognitive Status: Within Functional Limits for tasks assessed                                 General Comments: anxious related to nausea and dizziness        Exercises General Exercises - Lower Extremity Heel Slides: AROM, Right, 5 reps Hip ABduction/ADduction: AROM,  Right, Both    General Comments General comments (skin integrity, edema, etc.): VSS on RA,  BP 145/54 in sitting      Pertinent Vitals/Pain Pain Assessment Pain Assessment: Faces Faces Pain Scale: Hurts even more Pain Location: R hip Pain Descriptors / Indicators: Sore Pain Intervention(s): Monitored during session    Home Living                          Prior Function            PT Goals (current goals can now be found in  the care plan section) Acute Rehab PT Goals Patient Stated Goal: to return to independence Progress towards PT goals: Progressing toward goals (slowly)    Frequency    Min 1X/week      PT Plan      Co-evaluation              AM-PAC PT 6 Clicks Mobility   Outcome Measure  Help needed turning from your back to your side while in a flat bed without using bedrails?: A Little Help needed moving from lying on your back to sitting on the side of a flat bed without using bedrails?: A Little Help needed moving to and from a bed to a chair (including a wheelchair)?: A Little Help needed standing up from a chair using your arms (e.g., wheelchair or bedside chair)?: A Little Help needed to walk in hospital room?: Total Help needed climbing 3-5 steps with a railing? : Total 6 Click Score: 14    End of Session Equipment Utilized During Treatment: Gait belt Activity Tolerance: Treatment limited secondary to medical complications (Comment) (dizziness, reported diaphoresis) Patient left: with call bell/phone within reach;in chair;with chair alarm set Nurse Communication: Mobility status PT Visit Diagnosis: Other abnormalities of gait and mobility (R26.89);Muscle weakness (generalized) (M62.81)     Time: 0950-1009 PT Time Calculation (min) (ACUTE ONLY): 19 min  Charges:    $Therapeutic Activity: 8-22 mins PT General Charges $$ ACUTE PT VISIT: 1 Visit                     Linda [name], PT, DPT Acute Rehabilitation Office [phone]    Linda [name] 08/26/2023, 11:17 AM

## 2023-08-27 DIAGNOSIS — M84351A Stress fracture, right femur, initial encounter for fracture: Secondary | ICD-10-CM | POA: Diagnosis not present

## 2023-08-27 NOTE — Plan of Care (Signed)
   Problem: Education: Goal: Knowledge of General Education information will improve Description: Including pain rating scale, medication(s)/side effects and non-pharmacologic comfort measures Outcome: Progressing   Problem: Health Behavior/Discharge Planning: Goal: Ability to manage health-related needs will improve Outcome: Progressing   Problem: Clinical Measurements: Goal: Diagnostic test results will improve Outcome: Progressing

## 2023-08-27 NOTE — Progress Notes (Signed)
 Subjective: 2 Days Post-Op Procedure(s) (LRB): RIGHT TOTAL HIP REPLACEMENT (Right) Patient reports pain as mild.  Doing much better today  Objective: Vital signs in last 24 hours: Temp:  [97.3 F (36.3 C)-98.3 F (36.8 C)] 98.3 F (36.8 C) (01/15 0439) Pulse Rate:  [72-75] 73 (01/15 0439) Resp:  [18] 18 (01/15 0439) BP: (106-152)/(42-77) 106/42 (01/15 0439) SpO2:  [90 %-96 %] 91 % (01/15 0439)  Intake/Output from previous day: No intake/output data recorded. Intake/Output this shift: No intake/output data recorded.  Recent Labs    08/25/23 0850  HGB 13.2   Recent Labs    08/25/23 0850  WBC 7.8  RBC 4.48  HCT 42.1  PLT 273   Recent Labs    08/25/23 0850  NA 137  K 4.0  CL 102  CO2 23  BUN 12  CREATININE 0.61  GLUCOSE 75  CALCIUM 9.2   No results for input(s): "LABPT", "INR" in the last 72 hours.  Neurologically intact Neurovascular intact Sensation intact distally Intact pulses distally Dorsiflexion/Plantar flexion intact Incision: dressing C/D/I No cellulitis present Compartment soft   Assessment/Plan: 2 Days Post-Op Procedure(s) (LRB): RIGHT TOTAL HIP REPLACEMENT (Right) Advance diet Up with therapy D/C IV fluids Discharge home with home health once cleared by PT (hopeful for today) WBAT RLE      Sandie Cross 08/27/2023, 7:51 AM

## 2023-08-27 NOTE — Discharge Summary (Signed)
 Patient ID: Linda [name] MRN: [id number] DOB/AGE: Oct 25, 1940 83 y.o.  Admit date: 08/25/2023 Discharge date: 08/27/2023  Admission Diagnoses:  Principal Problem:   Stress fracture of neck of right femur Active Problems:   Degenerative joint disease of right hip   Status post total replacement of right hip   Discharge Diagnoses:  Same  Past Medical History:  Diagnosis Date   ALLERGIC RHINITIS    Allergy    ANEMIA-IRON DEFICIENCY    Anxiety state, unspecified    Carpal tunnel syndrome    pt denies    Cataract    Closed left ankle fracture    COLONIC POLYPS, HX OF 2007   GERD    GLAUCOMA    Glaucoma    GOITER, MULTINODULAR    on US  2006, unchanged 6/13 with nodules all <55mm   HIATAL HERNIA WITH REFLUX    Hip dysplasia, acquired    B sx; eval at [facility] for same 01/2015 -ongoing PT   OSTEOARTHRITIS    Osteoporosis    Serrated polyp of colon    TOBACCO USE, QUIT     Surgeries: Procedure(s): RIGHT TOTAL HIP REPLACEMENT on 08/25/2023   Consultants:   Discharged Condition: Improved  Hospital Course: Linda [name] is an 83 y.o. female who was admitted 08/25/2023 for operative treatment ofStress fracture of neck of right femur. Patient has severe unremitting pain that affects sleep, daily activities, and work/hobbies. After pre-op clearance the patient was taken to the operating room on 08/25/2023 and underwent  Procedure(s): RIGHT TOTAL HIP REPLACEMENT.    Patient was given perioperative antibiotics:  Anti-infectives (From admission, onward)    Start     Dose/Rate Route Frequency Ordered Stop   08/25/23 1700  ceFAZolin  (ANCEF ) IVPB 2g/100 mL premix        2 g 200 mL/hr over 30 Minutes Intravenous Every 6 hours 08/25/23 1425 08/25/23 2350   08/25/23 1135  vancomycin  (VANCOCIN ) powder  Status:  Discontinued          As needed 08/25/23 1135 08/25/23 1228   08/25/23 0830  ceFAZolin  (ANCEF ) IVPB 2g/100 mL premix        2 g 200 mL/hr over 30 Minutes Intravenous On call  to O.R. 08/25/23 0825 08/25/23 1054        Patient was given sequential compression devices, early ambulation, and chemoprophylaxis to prevent DVT.  Patient benefited maximally from hospital stay and there were no complications.    Recent vital signs: Patient Vitals for the past 24 hrs:  BP Temp Temp src Pulse Resp SpO2  08/27/23 0439 (!) 106/42 98.3 F (36.8 C) Oral 73 18 91 %  08/26/23 2021 (!) 140/54 (!) 97.3 F (36.3 C) Oral 75 18 94 %  08/26/23 1617 (!) 152/68 -- -- 75 -- --  08/26/23 1616 (!) 144/77 98.1 F (36.7 C) Oral 73 -- 96 %  08/26/23 0802 (!) 112/51 97.9 F (36.6 C) Oral 72 -- 90 %     Recent laboratory studies:  Recent Labs    08/25/23 0850  WBC 7.8  HGB 13.2  HCT 42.1  PLT 273  NA 137  K 4.0  CL 102  CO2 23  BUN 12  CREATININE 0.61  GLUCOSE 75  CALCIUM 9.2     Discharge Medications:   Allergies as of 08/27/2023   No Known Allergies      Medication List     STOP taking these medications    acetaminophen  650 MG CR tablet Commonly known as:  TYLENOL    traMADol  50 MG tablet Commonly known as: ULTRAM        TAKE these medications    aspirin  EC 81 MG tablet Take 1 tablet (81 mg total) by mouth 2 (two) times daily. To be taken after surgery to prevent blood clots   b complex vitamins tablet Take 1 tablet by mouth daily.   busPIRone  5 MG tablet Commonly known as: BUSPAR  Take 1 tablet (5 mg total) by mouth 2 (two) times daily as needed. Annual appt due in April must see provider for future refills What changed: when to take this   CITRACAL +D3 PO Take 1 tablet by mouth daily. 600mg  +1000 units twice daily   docusate sodium  100 MG capsule Commonly known as: Colace Take 1 capsule (100 mg total) by mouth daily as needed.   dorzolamide-timolol 2-0.5 % ophthalmic solution Commonly known as: COSOPT Place 1 drop into both eyes 2 (two) times daily.   doxycycline  100 MG tablet Commonly known as: VIBRA -TABS Take 1 tablet (100 mg total)  by mouth 2 (two) times daily.   estradiol 0.1 MG/GM vaginal cream Commonly known as: ESTRACE Place 1 Applicatorful vaginally 2 (two) times a week.   methocarbamol  750 MG tablet Commonly known as: Robaxin -750 Take 1 tablet (750 mg total) by mouth 2 (two) times daily as needed for muscle spasms.   omeprazole  40 MG capsule Commonly known as: PRILOSEC Take 1 capsule (40 mg total) by mouth daily.   ondansetron  4 MG tablet Commonly known as: Zofran  Take 1 tablet (4 mg total) by mouth every 8 (eight) hours as needed for nausea or vomiting.   oxyCODONE -acetaminophen  5-325 MG tablet Commonly known as: Percocet Take 1-2 tablets by mouth every 6 (six) hours as needed.   sertraline  100 MG tablet Commonly known as: ZOLOFT  TAKE ONE TABLET BY MOUTH ONCE DAILY   triamcinolone  cream 0.1 % Commonly known as: KENALOG  Apply 1 Application topically daily as needed (dry skin).               Durable Medical Equipment  (From admission, onward)           Start     Ordered   08/25/23 1337  DME Walker rolling  Once       Question:  Patient needs a walker to treat with the following condition  Answer:  History of hip replacement   08/25/23 1336   08/25/23 1337  DME 3 n 1  Once        08/25/23 1336   08/25/23 1337  DME Bedside commode  Once       Question:  Patient needs a bedside commode to treat with the following condition  Answer:  History of hip replacement   08/25/23 1336            Diagnostic Studies: DG Pelvis Portable Result Date: 08/25/2023 CLINICAL DATA:  Status post right hip replacement. EXAM: PORTABLE PELVIS 1-2 VIEWS COMPARISON:  None Available. FINDINGS: Right hip arthroplasty in expected alignment. No periprosthetic lucency or fracture. Recent postsurgical change includes air and edema in the soft tissues. IMPRESSION: Right hip arthroplasty without immediate postoperative complication. Electronically Signed   By: Chadwick Colonel M.D.   On: 08/25/2023 15:10   DG  HIP UNILAT WITH PELVIS 1V RIGHT Result Date: 08/25/2023 CLINICAL DATA:  Elective surgery. EXAM: DG HIP (WITH OR WITHOUT PELVIS) 1V RIGHT COMPARISON:  None Available. FINDINGS: Two fluoroscopic spot views of the pelvis and right hip obtained in the operating room. Images  during hip arthroplasty. Fluoroscopy time 23.5 seconds. Dose 2.48 mGy. IMPRESSION: Intraoperative fluoroscopy during right hip arthroplasty. Electronically Signed   By: Chadwick Colonel M.D.   On: 08/25/2023 12:41   DG C-Arm 1-60 Min-No Report Result Date: 08/25/2023 Fluoroscopy was utilized by the requesting physician.  No radiographic interpretation.   XR Pelvis 1-2 Views Result Date: 08/19/2023 X-rays of the right hip show rapid progression of joint space narrowing compared to prior x-rays.  She is now bone-on-bone.  MR Hip Right w/o contrast Result Date: 08/18/2023 CLINICAL DATA:  83 year old female with history of increasing right hip pain. Concern for stress fracture. EXAM: MR OF THE RIGHT HIP WITHOUT CONTRAST TECHNIQUE: Multiplanar, multisequence MR imaging was performed. No intravenous contrast was administered. COMPARISON:  Right hip radiographs dated June 24, 2023. FINDINGS: Soft tissue and Muscle: There is no significant focal soft tissue swelling.There is a partial-thickness undersurface tear of the right conjoint tendon origin.Partial-thickness insertional tear of the right gluteus minimus cuff insertion. Intramuscular edema within the right adductor musculature.Visualized intrapelvic contents are grossly unremarkable. No enlarged lymph nodes identified in the field of view. Bones: There is a nondisplaced basicervical fracture of the right femoral neck with T1/T2 hypointense fracture line extending from the lateral cortex at the base of the femoral neck, just proximal to the greater trochanter, medially to near the level of the proximal lesser trochanter. There is surrounding marrow edema. Levocurvature of the visualized mid  to lower lumbar spine with multilevel degenerative disc changes. Hip: There is lateral subluxation of the right femoral head relative to the acetabulum. Severe joint space narrowing is noted superiorly resulting in bone-on-bone contact of the superolateral joint space. Marginal osteophytosis of the acetabulum and humeral head. Diffuse labral degeneration/tearing. There is a right hip joint effusion demonstrating heterogenous T2 hyperintense signal intensity, concerning for synovitis. Pronounced marrow edema secondary to the basicervical fracture of the right femoral neck, described above, which is contiguous with marrow edema of the superior humeral head. The ligamentum teres and transverse ligaments appear intact. IMPRESSION: 1. Nondisplaced basicervical fracture of the right femoral neck with pronounced surrounding marrow edema which extends to the superior humeral head. 2. Severe degenerative changes of the right hip with near-complete loss of joint space superiorly resulting in bone-on-bone contact, diffuse labral degeneration/tearing, and lateral subluxation of the right femoral head relative to the acetabulum. 3. Right hip joint effusion with findings suggestive of synovitis, which may be secondary the nondisplaced fracture and degenerative changes of the right hip described above, however, septic arthritis can not be excluded. 4. Partial-thickness undersurface tear of the right hamstring conjoint tendon origin. 5. Partial-thickness tear of the right gluteus minimus cuff insertion Electronically Signed   By: Mannie Seek M.D.   On: 08/18/2023 13:08    Disposition: Discharge disposition: 01-Home or Self Care          Follow-up Information     Sandie Cross, PA-C. Schedule an appointment as soon as possible for a visit in 2 week(s).   Specialty: Orthopedic Surgery Contact information: [address] [place]  [place] [address] [phone]         Edward Graff, [facility]  Follow up.   Specialty: Home Health Services Why: [facility] will be providing home health services.  They will call you in the next 24-48 hours to set up services. Contact information: [address] [place] [address] [phone]         Care, [facility] Follow up.  Why: [employer] was called and 3:1 will be delivered to the hospital room prior to discharge to home. Contact information: [address] [place] [address] [phone]                  Signed: Sandie Cross 08/27/2023, 7:51 AM

## 2023-08-27 NOTE — Progress Notes (Signed)
 Physical Therapy Treatment Patient Details Name: Linda [name] MRN: [id number] DOB: 07-07-1941 Today's Date: 08/27/2023   History of Present Illness 83 y.o. female presents to [facility] hospital on 08/25/2023 for elective R THA. PMH includes GERD, bladder prolapse, anxiety, bilateral sensorineural hearing loss, cataract, depression, osteopenia, prediabetes.  83 y.o. female presents to [facility] hospital on 08/25/2023 for elective R THA. PMH includes GERD, bladder prolapse, anxiety, bilateral sensorineural hearing loss, cataract, depression, osteopenia, prediabetes.    PT Comments  Pt received in supine and agreeable to session. Pt reporting improved nausea today and is able to progress mobility with encouragement. Pt able to perform bed mobility, transfers, and gait trial with supervision for safety. Pt continues to rely on RW to offload RLE despite reporting little pain. Pt reports 1 step to enter, so education provided on sequencing and technique with pt verbalizing understanding, but declines trial this session. Discussed home set up and education provided on reducing fall risk and pt reports organizing supervision for the first week. Anticipate pt and family will be able to manage pt's mobility needs at home once medically ready for discharge.     If plan is discharge home, recommend the following: A little help with walking and/or transfers;A little help with bathing/dressing/bathroom;Assistance with cooking/housework;Assist for transportation   Can travel by private vehicle        Equipment Recommendations  BSC/3in1    Recommendations for Other Services       Precautions / Restrictions Precautions Precautions: Fall Precaution Comments: direct anterior THA Restrictions Weight Bearing Restrictions Per Provider Order: Yes RLE Weight Bearing Per Provider Order: Weight bearing as tolerated     Mobility  Bed Mobility Overal bed mobility: Needs Assistance Bed Mobility: Supine to Sit     Supine  to sit: Supervision, HOB elevated     General bed mobility comments: increased time    Transfers Overall transfer level: Needs assistance Equipment used: Rolling walker (2 wheels)   Sit to Stand: Supervision           General transfer comment: from low EOB    Ambulation/Gait Ambulation/Gait assistance: Supervision Gait Distance (Feet): 60 Feet Assistive device: Rolling walker (2 wheels) Gait Pattern/deviations: Step-through pattern, Antalgic Gait velocity: reduced     General Gait Details: slowed step-through gait, reduced stance time on RLE. Cues for sequencing and RW proximity   Stairs Stairs:  (discussed sequencing and technique with pt verbalizing understanding)               Balance Overall balance assessment: Needs assistance Sitting-balance support: No upper extremity supported, Feet supported Sitting balance-Leahy Scale: Good     Standing balance support: Bilateral upper extremity supported, During functional activity, Reliant on assistive device for balance Standing balance-Leahy Scale: Poor Standing balance comment: reliant RW support for RLE offloading                            Cognition Arousal: Alert Behavior During Therapy: WFL for tasks assessed/performed Overall Cognitive Status: Within Functional Limits for tasks assessed                                          Exercises      General Comments        Pertinent Vitals/Pain Pain Assessment Pain Assessment: Faces Faces Pain Scale: Hurts a little bit Pain Location: R hip  Pain Descriptors / Indicators: Sore Pain Intervention(s): Monitored during session, Repositioned     PT Goals (current goals can now be found in the care plan section) Acute Rehab PT Goals Patient Stated Goal: to return to independence PT Goal Formulation: With patient Time For Goal Achievement: 08/29/23 Progress towards PT goals: Progressing toward goals    Frequency    Min  1X/week       AM-PAC PT "6 Clicks" Mobility   Outcome Measure  Help needed turning from your back to your side while in a flat bed without using bedrails?: A Little Help needed moving from lying on your back to sitting on the side of a flat bed without using bedrails?: A Little Help needed moving to and from a bed to a chair (including a wheelchair)?: A Little Help needed standing up from a chair using your arms (e.g., wheelchair or bedside chair)?: A Little Help needed to walk in hospital room?: A Little Help needed climbing 3-5 steps with a railing? : A Little 6 Click Score: 18    End of Session Equipment Utilized During Treatment: Gait belt Activity Tolerance: Patient tolerated treatment well Patient left: in chair;with call bell/phone within reach Nurse Communication: Mobility status PT Visit Diagnosis: Other abnormalities of gait and mobility (R26.89);Muscle weakness (generalized) (M62.81)     Time: 2841-3244 PT Time Calculation (min) (ACUTE ONLY): 37 min  Charges:    $Gait Training: 8-22 mins $Self Care/Home Management: 8-22 PT General Charges $$ ACUTE PT VISIT: 1 Visit                    Michaelle Adolphus, PTA Acute Rehabilitation Services Secure Chat Preferred  [phone]    Michaelle Adolphus 08/27/2023, 10:08 AM

## 2023-09-04 ENCOUNTER — Other Ambulatory Visit: Payer: Self-pay | Admitting: Physician Assistant

## 2023-09-04 ENCOUNTER — Telehealth: Payer: Self-pay | Admitting: Physician Assistant

## 2023-09-04 MED ORDER — TRAMADOL HCL 50 MG PO TABS: 50.0000 mg | ORAL_TABLET | Freq: Three times a day (TID) | ORAL | 2 refills | Status: DC | PRN

## 2023-09-04 NOTE — Telephone Encounter (Signed)
Spoke to patient

## 2023-09-04 NOTE — Telephone Encounter (Signed)
Pt called requesting a call back. Pt states she stopped taking oxycodone and changed to tylenol and muscle relaxer and asking its not working and asking for medical advice to help with pain. Pt states she don't want to take the oxy. Please call pt at [phone].

## 2023-09-04 NOTE — Telephone Encounter (Signed)
Sent in tramadol to try.  Can also take robaxin and tylenol.  If tramadol not strong enough, we can try norco which is between oxy and tramadol

## 2023-09-09 ENCOUNTER — Encounter: Payer: Self-pay | Admitting: Physician Assistant

## 2023-09-09 ENCOUNTER — Ambulatory Visit (INDEPENDENT_AMBULATORY_CARE_PROVIDER_SITE_OTHER): Payer: Medicare Other | Admitting: Physician Assistant

## 2023-09-09 DIAGNOSIS — Z96641 Presence of right artificial hip joint: Secondary | ICD-10-CM

## 2023-09-09 NOTE — Progress Notes (Signed)
Post-Op Visit Note   Patient: Linda [name]           Date of Birth: 1940-09-20           MRN: [id number] Visit Date: 09/09/2023 PCP: Myrlene Broker, MD   Assessment & Plan:  Chief Complaint:  Chief Complaint  Patient presents with   Right Hip - Follow-up    Right total hip arthroplasty 08/25/2023   Visit Diagnoses:  1. Status post total replacement of right hip     Plan: Patient is a pleasant 83 year old female who comes in today 2 weeks status post right total hip replacement 08/25/2023.  She has been struggling with pain and medication regimen since surgery.  She is currently taking Tylenol and tramadol but has been relatively constipated.  She was able to have a bowel movement today.  She has been compliant taking a baby aspirin twice daily for DVT prophylaxis.  She is currently ambulating with a walker.  Examination right hip reveals a well-healing surgical incision with nylon sutures in place.  No evidence of infection or cellulitis.  Calves are soft nontender.  She is neurovascularly intact distally.  Today, her sutures were removed and Steri-Strips applied.  She will continue her baby aspirin twice daily for DVT prophylaxis for another 4 weeks.  She has been provided postoperative instructions.  She would like to attend formal physical therapy so I sent in a referral to [facility] rehab in [place].  She will follow-up with [place] in 4 weeks for repeat evaluation and AP pelvis x-rays.  Call with concerns or questions.  Follow-Up Instructions: Return in about 4 weeks (around 10/07/2023).   Orders:  No orders of the defined types were placed in this encounter.  No orders of the defined types were placed in this encounter.   Imaging: No new imaging  PMFS History: Patient Active Problem List   Diagnosis Date Noted   Status post total replacement of right hip 08/25/2023   Degenerative joint disease of right hip 08/24/2023   Spinal stenosis of lumbar region 10/09/2022    Facet arthritis of lumbar region 10/09/2022   Chronic bilateral low back pain without sciatica 10/04/2022   Osteopenia 11/26/2021   Pre-diabetes 11/26/2021   Other fatigue 07/28/2020   Dupuytren's disease of palm 01/19/2018   Viral URI 01/23/2017   Bilateral sensorineural hearing loss 03/12/2016   Routine general medical examination at a health care facility 07/22/2015   Stress fracture of neck of right femur 01/25/2015   Anemia 11/06/2012   Anxiety 11/06/2012   Depression 11/06/2012   Cataract, nuclear 10/28/2012   Primary open angle glaucoma of both eyes, indeterminate stage 10/28/2012   Ptosis of eyelid 10/28/2012   Bladder prolapse, female, acquired 01/09/2012   GERD (gastroesophageal reflux disease) 05/21/2007   Past Medical History:  Diagnosis Date   ALLERGIC RHINITIS    Allergy    ANEMIA-IRON DEFICIENCY    Anxiety state, unspecified    Carpal tunnel syndrome    pt denies    Cataract    Closed left ankle fracture    COLONIC POLYPS, HX OF 2007   GERD    GLAUCOMA    Glaucoma    GOITER, MULTINODULAR    on [place] 2006, unchanged 6/13 with nodules all <54mm   HIATAL HERNIA WITH REFLUX    Hip dysplasia, acquired    B sx; eval at [facility] for same 01/2015 -ongoing PT   OSTEOARTHRITIS    Osteoporosis    Serrated polyp of  colon    TOBACCO USE, QUIT     Family History  Problem Relation Age of Onset   Diabetes Mother    Heart disease Mother    Arthritis Mother    Hypertension Mother    Arthritis Father    Colon cancer Paternal Grandmother    Esophageal cancer Neg Hx    Rectal cancer Neg Hx    Stomach cancer Neg Hx     Past Surgical History:  Procedure Laterality Date   BREAST SURGERY  1960   Left breast cyst removed no complications   CARPAL TUNNEL RELEASE Right    COLONOSCOPY  2005   EYE SURGERY  1991-1992   both eyes   EYE SURGERY Left 2014   shunt behind left eye   ORIF FIBULA FRACTURE Left 08/24/2019   Procedure: OPEN REDUCTION INTERNAL FIXATION (ORIF) LEFT  DISTAL FIBULA FRACTURE;  Surgeon: Teryl Lucy, MD;  Location: [facility];  Service: Orthopedics;  Laterality: Left;   Right shoulder aurgery  10/2008   TOTAL HIP ARTHROPLASTY Right 08/25/2023   Procedure: RIGHT TOTAL HIP REPLACEMENT;  Surgeon: Tarry Kos, MD;  Location: [facility] OR;  Service: Orthopedics;  Laterality: Right;  3-C   Social History   Occupational History   Not on file  Tobacco Use   Smoking status: Former    Current packs/day: 0.00    Types: Cigarettes    Quit date: 08/12/1988    Years since quitting: 35.0   Smokeless tobacco: Never  Vaping Use   Vaping status: Never Used  Substance and Sexual Activity   Alcohol use: Yes    Alcohol/week: 0.0 standard drinks of alcohol    Comment: occassionally   Drug use: No   Sexual activity: Not Currently    Birth control/protection: Post-menopausal

## 2023-09-12 ENCOUNTER — Telehealth: Payer: Self-pay | Admitting: Physician Assistant

## 2023-09-12 ENCOUNTER — Telehealth: Payer: Self-pay

## 2023-09-12 NOTE — Telephone Encounter (Signed)
Pt called stating she is having trouble sleeping and need medical advice on taking sleep aide. Please call pt at [phone].

## 2023-09-12 NOTE — Telephone Encounter (Signed)
Spoke with patient. She was not wanting anything for sleep. She wanted to make sure it was okay to take Tylenol PM in the evening. I explained that was fine, to take according to the bottle so she doesn't exceed her daily limit of Tylenol.

## 2023-09-12 NOTE — Telephone Encounter (Signed)
 Linda [name]

## 2023-09-15 ENCOUNTER — Telehealth: Payer: Self-pay | Admitting: Internal Medicine

## 2023-09-15 NOTE — Telephone Encounter (Signed)
Inbound call from patient, would like to speak with a nurse in regards to GI "relief". She states she had surgery three weeks ago, and since has experienced some bloating, and constipation. She would like to discuss possible medications to help with constipation.

## 2023-09-15 NOTE — Telephone Encounter (Signed)
Left message for pt to call back.  Pt recently had hip surgery and is having problems with constipation/bloating. Pt states it has been several days since she had a good bowel movement. Instructed pt to complete a miralax purge with 7 doses of miralax mixed with 32 oz of liquid drinking 1 cup every until gone. Pt verbalized understanding and knows to call back if she has any questions.

## 2023-09-18 ENCOUNTER — Encounter: Payer: Self-pay | Admitting: Rehabilitative and Restorative Service Providers"

## 2023-09-18 ENCOUNTER — Other Ambulatory Visit: Payer: Self-pay

## 2023-09-18 ENCOUNTER — Ambulatory Visit: Payer: Medicare Other | Attending: Physician Assistant | Admitting: Rehabilitative and Restorative Service Providers"

## 2023-09-18 DIAGNOSIS — M6281 Muscle weakness (generalized): Secondary | ICD-10-CM | POA: Diagnosis present

## 2023-09-18 DIAGNOSIS — Z96641 Presence of right artificial hip joint: Secondary | ICD-10-CM | POA: Insufficient documentation

## 2023-09-18 DIAGNOSIS — M25551 Pain in right hip: Secondary | ICD-10-CM | POA: Diagnosis not present

## 2023-09-18 DIAGNOSIS — R262 Difficulty in walking, not elsewhere classified: Secondary | ICD-10-CM | POA: Insufficient documentation

## 2023-09-18 DIAGNOSIS — R252 Cramp and spasm: Secondary | ICD-10-CM

## 2023-09-18 NOTE — Patient Instructions (Signed)
 Linda [name]

## 2023-09-18 NOTE — Therapy (Signed)
 OUTPATIENT PHYSICAL THERAPY LOWER EXTREMITY EVALUATION   Patient Name: Linda [name] MRN: [id number] DOB:Apr 11, 1941, 83 y.o., female Today's Date: 09/18/2023  END OF SESSION:  PT End of Session - 09/18/23 1015     Visit Number 1    Date for PT Re-Evaluation 11/14/23    Authorization Type BC/BS Medicare    Progress Note Due on Visit 10    PT Start Time 1005    PT Stop Time 1045    PT Time Calculation (min) 40 min    Activity Tolerance Patient tolerated treatment well    Behavior During Therapy WFL for tasks assessed/performed             Past Medical History:  Diagnosis Date   ALLERGIC RHINITIS    Allergy    ANEMIA-IRON DEFICIENCY    Anxiety state, unspecified    Carpal tunnel syndrome    pt denies    Cataract    Closed left ankle fracture    COLONIC POLYPS, HX OF 2007   GERD    GLAUCOMA    Glaucoma    GOITER, MULTINODULAR    on US  2006, unchanged 6/13 with nodules all <22mm   HIATAL HERNIA WITH REFLUX    Hip dysplasia, acquired    B sx; eval at [facility] for same 01/2015 -ongoing PT   OSTEOARTHRITIS    Osteoporosis    Serrated polyp of colon    TOBACCO USE, QUIT    Past Surgical History:  Procedure Laterality Date   BREAST SURGERY  1960   Left breast cyst removed no complications   CARPAL TUNNEL RELEASE Right    COLONOSCOPY  2005   EYE SURGERY  1991-1992   both eyes   EYE SURGERY Left 2014   shunt behind left eye   ORIF FIBULA FRACTURE Left 08/24/2019   Procedure: OPEN REDUCTION INTERNAL FIXATION (ORIF) LEFT DISTAL FIBULA FRACTURE;  Surgeon: Josefina Chew, MD;  Location: [facility];  Service: Orthopedics;  Laterality: Left;   Right shoulder aurgery  10/2008   TOTAL HIP ARTHROPLASTY Right 08/25/2023   Procedure: RIGHT TOTAL HIP REPLACEMENT;  Surgeon: Jerri Kay HERO, MD;  Location: [facility] OR;  Service: Orthopedics;  Laterality: Right;  3-C   Patient Active Problem List   Diagnosis Date Noted   Status post total replacement of right hip 08/25/2023    Degenerative joint disease of right hip 08/24/2023   Spinal stenosis of lumbar region 10/09/2022   Facet arthritis of lumbar region 10/09/2022   Chronic bilateral low back pain without sciatica 10/04/2022   Osteopenia 11/26/2021   Pre-diabetes 11/26/2021   Other fatigue 07/28/2020   Dupuytren's disease of palm 01/19/2018   Viral URI 01/23/2017   Bilateral sensorineural hearing loss 03/12/2016   Routine general medical examination at a health care facility 07/22/2015   Stress fracture of neck of right femur 01/25/2015   Anemia 11/06/2012   Anxiety 11/06/2012   Depression 11/06/2012   Cataract, nuclear 10/28/2012   Primary open angle glaucoma of both eyes, indeterminate stage 10/28/2012   Ptosis of eyelid 10/28/2012   Bladder prolapse, female, acquired 01/09/2012   GERD (gastroesophageal reflux disease) 05/21/2007    PCP: Rollene Almarie LABOR, MD  REFERRING PROVIDER: Jule Ronal CROME, PA-C  REFERRING DIAG: [phone] (ICD-10-CM) - Status post total replacement of right hip  THERAPY DIAG:  Difficulty in walking, not elsewhere classified  Muscle weakness (generalized)  Pain in right hip  Cramp and spasm  Rationale for Evaluation and Treatment: Rehabilitation  ONSET  DATE: S/P Right THA on 08/25/2023  SUBJECTIVE:   SUBJECTIVE STATEMENT: Patient states that she underwent a Right THA on 08/25/2023 and started first with home health.  She states that she has been doing the exercises and has been trying to walk some in her kitchen without using her walker.  States that she has a [employer] concert scheduled for Valentine's Day and she is hoping to go to it.  PERTINENT HISTORY: Hx of back pain, R THA on 08/25/23, OA, Hx of Left ankle fracture in 2021, osteopenia  PAIN:  Are you having pain? Yes: NPRS scale: 4-6/10 Pain location: right hip Pain description: aching Aggravating factors: worse in the morning and with prolonged standing Relieving factors: movement  PRECAUTIONS:  None  RED FLAGS: None   WEIGHT BEARING RESTRICTIONS: No  FALLS:  Has patient fallen in last 6 months? Yes. Number of falls 1 fall before surgery when she missed a step and fell at her daughter's home  LIVING ENVIRONMENT: Lives with: lives alone Lives in: House/apartment Stairs:  two story home Has following equipment at home: Single point cane, Environmental Consultant - 2 wheeled, shower chair, and bed side commode  OCCUPATION: Retired  PLOF: Independent and Vocation/Vocational requirements: water aerobics, church activities  PATIENT GOALS: To be independent again and return to driving and community activities.  NEXT MD VISIT: Osteoporosis Clinic with Ronal Dragon Persons, PA on 09/22/23  OBJECTIVE:  Note: Objective measures were completed at Evaluation unless otherwise noted.  DIAGNOSTIC FINDINGS:  Pelvic Radiograph on 08/25/2023: IMPRESSION: Right hip arthroplasty without immediate postoperative complication.  PATIENT SURVEYS:  Eval:  LEFS 30 / 80 = 37.5 %  COGNITION: Overall cognitive status: Within functional limits for tasks assessed     SENSATION: Patient states some numbness at incision site.  MUSCLE LENGTH: Hamstrings: Right tightness noted  POSTURE: rounded shoulders and forward head  PALPATION: Tender to palpation along incision site  LOWER EXTREMITY ROM:  WFL  LOWER EXTREMITY MMT:  Eval:   Right hip strength grossly 4 to 4+/5 Right quad and hamstring strength of 5-/5 Left LE strength is WFL   FUNCTIONAL TESTS:  Eval: 5 times sit to stand: 21.66 with required use of UE pushing chair handles Timed up and go (TUG): 20.66 sec with RW  GAIT: Distance walked: >200 ft Assistive device utilized: Environmental Consultant - 2 wheeled Level of assistance: Modified independence Comments: Pt with antalgic gait pattern                                                                                                                                TODAY'S TREATMENT   DATE: 09/18/2023   Issued HEP and reviewed standing exercises Reviewed abdominal massage for constipation prevention and provided handout   PATIENT EDUCATION:  Education details: Issued HEP Person educated: Patient Education method: Explanation, Demonstration, and Handouts Education comprehension: verbalized understanding and returned demonstration  HOME EXERCISE PROGRAM: Access Code: [email or web address] URL: [email or web address] Date: 09/18/2023 Prepared by:  Naly Schwanz  Exercises - Standing March with Counter Support  - 1 x daily - 7 x weekly - 2 sets - 10 reps - Standing Hip Abduction with Counter Support  - 1 x daily - 7 x weekly - 2 sets - 10 reps - Standing Hip Extension with Counter Support  - 1 x daily - 7 x weekly - 2 sets - 10 reps - Mini Squat with Counter Support  - 1 x daily - 7 x weekly - 2 sets - 10 reps - Heel Raises with Counter Support  - 1 x daily - 7 x weekly - 2 sets - 10 reps  ASSESSMENT:  CLINICAL IMPRESSION: Patient is an 83 y.o. female who was seen today for physical therapy evaluation and treatment for s/p right hip arthroplasty on 08/25/2023.  Patient's PLOF is independent and able to participate in church activities and go out into the community.  Patient presents with increased right hip pain, muscle weakness, difficulty walking, and decreased flexibility.  Patient states that prior to surgery, she participated in aquatic aerobics and she is considering joining [employer] when she is stronger, so she was interested in aquatic PT when cleared by the surgeon to participate.  Patient would benefit from skilled PT to progress towards goal related activities to allow her to return to her independent and active lifestyle.  OBJECTIVE IMPAIRMENTS: decreased balance, decreased mobility, difficulty walking, decreased strength, increased muscle spasms, impaired flexibility, and pain.   ACTIVITY LIMITATIONS: lifting, bending, standing, squatting, sleeping, and stairs  PARTICIPATION  LIMITATIONS: cleaning, laundry, driving, community activity, and church  PERSONAL FACTORS: Past/current experiences and 3+ comorbidities: osteopenia, Hx of left ankle Fx, s/p R THA on 08/25/2023  are also affecting patient's functional outcome.   REHAB POTENTIAL: Good  CLINICAL DECISION MAKING: Evolving/moderate complexity  EVALUATION COMPLEXITY: Moderate   GOALS: Goals reviewed with patient? Yes  SHORT TERM GOALS: Target date: 10/10/2023 Patient will be independent with initial HEP. Baseline: Goal status: INITIAL  2.  Patient will participate in 3 or 6 minute walk test to establish a baseline. Baseline:  Goal status: INITIAL  LONG TERM GOALS: Target date: 11/14/2023  Patient will be independent with advanced HEP to allow for self progression after discharge. Baseline:  Goal status: INITIAL  2.  Patient will increase right hip strength to [facility] to allow her to navigate stairs in her home with reciprocal pattern. Baseline:  Goal status: INITIAL  3.  Patient will be able to walk for at least 15 minutes with LRAD to allow for return to community ambulation. Baseline:  Goal status: INITIAL  4.  Patient will improve Lower Extremity Functional Scale to at least 50% to demonstrate improved functional mobility. Baseline: 37.5% Goal status: INITIAL  5.  Patient will report ability to resume aquatic aerobics class and going on outings with her friends without increased pain. Baseline:  Goal status: INITIAL   PLAN:  PT FREQUENCY: 2x/week  PT DURATION: 8 weeks  PLANNED INTERVENTIONS: 97164- PT Re-evaluation, 97110-Therapeutic exercises, 97530- Therapeutic activity, [id number]- Neuromuscular re-education, 97535- Self Care, 02859- Manual therapy, [id number]- Gait training, [phone]- Aquatic Therapy, 97014- Electrical stimulation (unattended), [id number]- Electrical stimulation (manual), [id number]- Vasopneumatic device, [id number]- Ultrasound, [id number]- Traction (mechanical), [id number]- Ionotophoresis 4mg /ml  Dexamethasone , Patient/Family education, Balance training, Stair training, Taping, Dry Needling, Joint mobilization, Joint manipulation, Spinal manipulation, Spinal mobilization, Scar mobilization, Cryotherapy, and Moist heat  PLAN FOR NEXT SESSION: Assess and progress HEP as indicated, strengthening, flexibility, manual/dry needling as indicated    Jarrell Laming, PT,  DPT 09/18/23, 10:18 AM  [facility] [address] [place], [place] [address] Phone # [phone] Fax [phone]

## 2023-09-22 ENCOUNTER — Encounter: Payer: Self-pay | Admitting: Physician Assistant

## 2023-09-22 ENCOUNTER — Ambulatory Visit (INDEPENDENT_AMBULATORY_CARE_PROVIDER_SITE_OTHER): Payer: Medicare Other | Admitting: Physician Assistant

## 2023-09-22 VITALS — Ht 62.0 in | Wt 135.2 lb

## 2023-09-22 DIAGNOSIS — M810 Age-related osteoporosis without current pathological fracture: Secondary | ICD-10-CM

## 2023-09-22 NOTE — Progress Notes (Signed)
 Office Visit Note   Patient: Linda [name]           Date of Birth: September 10, 1940           MRN: [id number] Visit Date: 09/22/2023              Requested by: Shauna Del, DO [address] [place],  [place] [address] PCP: Adelia Homestead, MD   Assessment & Plan: Visit Diagnoses:  1. Age-related osteoporosis without current pathological fracture     Plan: 83 year old woman who is referred by Dr. Vaughn Georges for evaluation for osteoporosis management.  She is currently rehabbing from a hip fracture which was treated with total hip arthroplasty by Dr. Christiane Cowing.  She has had bone densities in the past which have shown osteopenia.  Last 1 being about 3 years ago.  Considering her fracture by definition she now has osteoporosis.  She still likes to stay active.  She does take calcium and vitamin D  but no specific medications in the past for osteoporosis.  She has never had cancer does not have kidney disease no ulcers she does have reflux for which she takes Protonix  no history of epilepsy or seizures.  She did go through menopause at age 78 and did for a while take hormone replacement therapy.  She is a former smoker but has not smoked in 20 years.  She drinks roughly 6 alcoholic beverages a week.  She does do strength training via water aerobics and strengthening but has not been able to do it because of her current rehab and fracture she has had some dental work in the last year.  She does not think there is any family history of bone cancers or osteoporosis.  We discussed osteoporosis treatments as well as evaluations.  I would like to update her bone density scan and some of her labs.  She would be a good candidate for Evenity and Prolia.  Because of her reflux not a good candidate for Fosamax.  Spent 30+ minutes discussing the nature of osteoporosis treatment options both therapeutics and lifestyle changes all of her questions were answered  Follow-Up Instructions: After bone density scan  Orders:   Orders Placed This Encounter  Procedures   DG BONE DENSITY (DXA)   Vitamin D  (25 hydroxy)   TSH   Parathyroid  hormone, intact (no Ca)   Calcium   No orders of the defined types were placed in this encounter.     Procedures: No procedures performed   Clinical Data: No additional findings.   Subjective: Chief Complaint  Patient presents with   Osteoporosis    HPI pleasant 83 year old woman presents today in referral.  She has a history of a fragility fracture in her hip and underwent total hip arthroplasty recently.  Denies any other fractures though she does say she has a scoliosis in her spine.  Review of Systems  All other systems reviewed and are negative.    Objective: Vital Signs: Ht 5\' 2"  (1.575 m)   Wt 135 lb 3.2 oz (61.3 kg)   BMI 24.73 kg/m   Physical Exam Constitutional:      Appearance: Normal appearance.  Pulmonary:     Effort: Pulmonary effort is normal.  Skin:    General: Skin is warm and dry.  Neurological:     General: No focal deficit present.     Mental Status: She is alert and oriented to person, place, and time.  Psychiatric:        Mood and  Affect: Mood normal.        Behavior: Behavior normal.     Ortho Exam  Specialty Comments:  No specialty comments available.  Imaging: No results found.   PMFS History: Patient Active Problem List   Diagnosis Date Noted   Status post total replacement of right hip 08/25/2023   Degenerative joint disease of right hip 08/24/2023   Spinal stenosis of lumbar region 10/09/2022   Facet arthritis of lumbar region 10/09/2022   Chronic bilateral low back pain without sciatica 10/04/2022   Osteopenia 11/26/2021   Pre-diabetes 11/26/2021   Other fatigue 07/28/2020   Dupuytren's disease of palm 01/19/2018   Viral URI 01/23/2017   Bilateral sensorineural hearing loss 03/12/2016   Routine general medical examination at a health care facility 07/22/2015   Stress fracture of neck of right femur  01/25/2015   Anemia 11/06/2012   Anxiety 11/06/2012   Depression 11/06/2012   Cataract, nuclear 10/28/2012   Primary open angle glaucoma of both eyes, indeterminate stage 10/28/2012   Ptosis of eyelid 10/28/2012   Bladder prolapse, female, acquired 01/09/2012   GERD (gastroesophageal reflux disease) 05/21/2007   Past Medical History:  Diagnosis Date   ALLERGIC RHINITIS    Allergy    ANEMIA-IRON DEFICIENCY    Anxiety state, unspecified    Carpal tunnel syndrome    pt denies    Cataract    Closed left ankle fracture    COLONIC POLYPS, HX OF 2007   GERD    GLAUCOMA    Glaucoma    GOITER, MULTINODULAR    on US  2006, unchanged 6/13 with nodules all <48mm   HIATAL HERNIA WITH REFLUX    Hip dysplasia, acquired    B sx; eval at [facility] for same 01/2015 -ongoing PT   OSTEOARTHRITIS    Osteoporosis    Serrated polyp of colon    TOBACCO USE, QUIT     Family History  Problem Relation Age of Onset   Diabetes Mother    Heart disease Mother    Arthritis Mother    Hypertension Mother    Arthritis Father    Colon cancer Paternal Grandmother    Esophageal cancer Neg Hx    Rectal cancer Neg Hx    Stomach cancer Neg Hx     Past Surgical History:  Procedure Laterality Date   BREAST SURGERY  1960   Left breast cyst removed no complications   CARPAL TUNNEL RELEASE Right    COLONOSCOPY  2005   EYE SURGERY  1991-1992   both eyes   EYE SURGERY Left 2014   shunt behind left eye   ORIF FIBULA FRACTURE Left 08/24/2019   Procedure: OPEN REDUCTION INTERNAL FIXATION (ORIF) LEFT DISTAL FIBULA FRACTURE;  Surgeon: Osa Blase, MD;  Location: [facility];  Service: Orthopedics;  Laterality: Left;   Right shoulder aurgery  10/2008   TOTAL HIP ARTHROPLASTY Right 08/25/2023   Procedure: RIGHT TOTAL HIP REPLACEMENT;  Surgeon: Wes Hamman, MD;  Location: [facility];  Service: Orthopedics;  Laterality: Right;  3-C   Social History   Occupational History   Not on file  Tobacco Use    Smoking status: Former    Current packs/day: 0.00    Types: Cigarettes    Quit date: 08/12/1988    Years since quitting: 35.1   Smokeless tobacco: Never  Vaping Use   Vaping status: Never Used  Substance and Sexual Activity   Alcohol use: Yes    Alcohol/week: 0.0 standard drinks  of alcohol    Comment: occassionally   Drug use: No   Sexual activity: Not Currently    Birth control/protection: Post-menopausal

## 2023-09-23 LAB — VITAMIN D 25 HYDROXY (VIT D DEFICIENCY, FRACTURES): Vit D, 25-Hydroxy: 44 ng/mL (ref 30–100)

## 2023-09-23 LAB — PARATHYROID HORMONE, INTACT (NO CA): PTH: 15 pg/mL — ABNORMAL LOW (ref 16–77)

## 2023-09-23 LAB — TSH: TSH: 1.94 m[IU]/L (ref 0.40–4.50)

## 2023-09-26 ENCOUNTER — Ambulatory Visit: Payer: Medicare Other | Admitting: Physical Therapy

## 2023-09-26 ENCOUNTER — Encounter: Payer: Self-pay | Admitting: Physical Therapy

## 2023-09-26 DIAGNOSIS — R252 Cramp and spasm: Secondary | ICD-10-CM

## 2023-09-26 DIAGNOSIS — M6281 Muscle weakness (generalized): Secondary | ICD-10-CM

## 2023-09-26 DIAGNOSIS — R262 Difficulty in walking, not elsewhere classified: Secondary | ICD-10-CM

## 2023-09-26 DIAGNOSIS — M25551 Pain in right hip: Secondary | ICD-10-CM

## 2023-09-26 NOTE — Therapy (Signed)
OUTPATIENT PHYSICAL THERAPY LOWER EXTREMITY TREATMENT   Patient Name: Linda [name] MRN: [id number] DOB:[birth year], 83 y.o., female Today's Date: 09/26/2023  END OF SESSION:  PT End of Session - 09/26/23 0935     Visit Number 2    Date for PT Re-Evaluation 11/14/23    Authorization Type BC/BS Medicare    Progress Note Due on Visit 10    PT Start Time 0930    PT Stop Time 1017    PT Time Calculation (min) 47 min    Activity Tolerance Patient tolerated treatment well    Behavior During Therapy WFL for tasks assessed/performed              Past Medical History:  Diagnosis Date   ALLERGIC RHINITIS    Allergy    ANEMIA-IRON DEFICIENCY    Anxiety state, unspecified    Carpal tunnel syndrome    pt denies    Cataract    Closed left ankle fracture    COLONIC POLYPS, HX OF 2007   GERD    GLAUCOMA    Glaucoma    GOITER, MULTINODULAR    on [place] 2006, unchanged 6/13 with nodules all <14mm   HIATAL HERNIA WITH REFLUX    Hip dysplasia, acquired    B sx; eval at [facility] for same 01/2015 -ongoing PT   OSTEOARTHRITIS    Osteoporosis    Serrated polyp of colon    TOBACCO USE, QUIT    Past Surgical History:  Procedure Laterality Date   BREAST SURGERY  1960   Left breast cyst removed no complications   CARPAL TUNNEL RELEASE Right    COLONOSCOPY  2005   EYE SURGERY  1991-1992   both eyes   EYE SURGERY Left 2014   shunt behind left eye   ORIF FIBULA FRACTURE Left 08/24/2019   Procedure: OPEN REDUCTION INTERNAL FIXATION (ORIF) LEFT DISTAL FIBULA FRACTURE;  Surgeon: Teryl Lucy, MD;  Location: [facility];  Service: Orthopedics;  Laterality: Left;   Right shoulder aurgery  10/2008   TOTAL HIP ARTHROPLASTY Right 08/25/2023   Procedure: RIGHT TOTAL HIP REPLACEMENT;  Surgeon: Tarry Kos, MD;  Location: [facility] OR;  Service: Orthopedics;  Laterality: Right;  3-C   Patient Active Problem List   Diagnosis Date Noted   Status post total replacement of right hip 08/25/2023    Degenerative joint disease of right hip 08/24/2023   Spinal stenosis of lumbar region 10/09/2022   Facet arthritis of lumbar region 10/09/2022   Chronic bilateral low back pain without sciatica 10/04/2022   Osteopenia 11/26/2021   Pre-diabetes 11/26/2021   Other fatigue 07/28/2020   Dupuytren's disease of palm 01/19/2018   Viral URI 01/23/2017   Bilateral sensorineural hearing loss 03/12/2016   Routine general medical examination at a health care facility 07/22/2015   Stress fracture of neck of right femur 01/25/2015   Anemia 11/06/2012   Anxiety 11/06/2012   Depression 11/06/2012   Cataract, nuclear 10/28/2012   Primary open angle glaucoma of both eyes, indeterminate stage 10/28/2012   Ptosis of eyelid 10/28/2012   Bladder prolapse, female, acquired 01/09/2012   GERD (gastroesophageal reflux disease) 05/21/2007    PCP: Myrlene Broker, MD  REFERRING PROVIDER: Cristie Hem, PA-C  REFERRING DIAG: [phone] (ICD-10-CM) - Status post total replacement of right hip  THERAPY DIAG:  Difficulty in walking, not elsewhere classified  Muscle weakness (generalized)  Pain in right hip  Cramp and spasm  Rationale for Evaluation and Treatment: Rehabilitation  ONSET DATE: S/P Right THA on 08/25/2023  SUBJECTIVE:   SUBJECTIVE STATEMENT: I have pain in my hip but it is getting better. I am walking around my house a lot without an AD.  Arrives with tall handled  three wheeled walker.    Patient states that she underwent a Right THA on 08/25/2023 and started first with home health.  She states that she has been doing the exercises and has been trying to walk some in her kitchen without using her walker.  States that she has a [employer] concert scheduled for Valentine's Day and she is hoping to go to it.  PERTINENT HISTORY: Hx of back pain, R THA on 08/25/23, OA, Hx of Left ankle fracture in 2021, osteopenia  PAIN:  Are you having pain? Yes: NPRS scale: 4-6/10 Pain location:  right hip Pain description: aching Aggravating factors: worse in the morning and with prolonged standing Relieving factors: movement  PRECAUTIONS: None  RED FLAGS: None   WEIGHT BEARING RESTRICTIONS: No  FALLS:  Has patient fallen in last 6 months? Yes. Number of falls 1 fall before surgery when she missed a step and fell at her daughter's home  LIVING ENVIRONMENT: Lives with: lives alone Lives in: House/apartment Stairs:  two story home Has following equipment at home: Single point cane, Environmental consultant - 2 wheeled, shower chair, and bed side commode  OCCUPATION: Retired  PLOF: Independent and Vocation/Vocational requirements: water aerobics, church activities  PATIENT GOALS: To be independent again and return to driving and community activities.  NEXT MD VISIT: Osteoporosis Clinic with [place] Persons, PA on 09/22/23  OBJECTIVE:  Note: Objective measures were completed at Evaluation unless otherwise noted.  DIAGNOSTIC FINDINGS:  Pelvic Radiograph on 08/25/2023: IMPRESSION: Right hip arthroplasty without immediate postoperative complication.  PATIENT SURVEYS:  Eval:  LEFS 30 / 80 = 37.5 %  COGNITION: Overall cognitive status: Within functional limits for tasks assessed     SENSATION: Patient states some numbness at incision site.  MUSCLE LENGTH: Hamstrings: Right tightness noted  POSTURE: rounded shoulders and forward head  PALPATION: Tender to palpation along incision site  LOWER EXTREMITY ROM:  WFL  LOWER EXTREMITY MMT:  Eval:   Right hip strength grossly 4 to 4+/5 Right quad and hamstring strength of 5-/5 Left LE strength is WFL   FUNCTIONAL TESTS:  Eval: 5 times sit to stand: 21.66 with required use of UE pushing chair handles Timed up and go (TUG): 20.66 sec with RW 09/26/23: covered 244' with gait belt and no AD  GAIT: Distance walked: >200 ft Assistive device utilized: Walker - 2 wheeled Level of assistance: Modified  independence Comments: Pt with antalgic gait pattern                                                                                                                                TODAY'S TREATMENT  DATE: 09/26/2023 covered 244' with gait belt and no AD In  parallel bars: high knee march x20, sidestepping 3 rounds, Rt hip ext x10, 1/4 squats 2x10, heel/toe raises x10 Supine pelvic tilt with adductor ball squeeze 10x3" Supine articulating bridge x10 Supine hip abd red loop x10 Sit to stand with bil UE assistance x10 NuStep L2 x 4' PT present to monitor and update HEP  DATE: 09/18/2023  Issued HEP and reviewed standing exercises Reviewed abdominal massage for constipation prevention and provided handout   PATIENT EDUCATION:  Education details: Issued HEP Person educated: Patient Education method: Explanation, Facilities manager, and Handouts Education comprehension: verbalized understanding and returned demonstration  HOME EXERCISE PROGRAM: Access Code: [email or web address] URL: [email or web address] Date: 09/26/2023 Prepared by: Morton Peters  Exercises - Standing March with Counter Support  - 1 x daily - 7 x weekly - 2 sets - 10 reps - Standing Hip Abduction with Counter Support  - 1 x daily - 7 x weekly - 2 sets - 10 reps - Standing Hip Extension with Counter Support  - 1 x daily - 7 x weekly - 2 sets - 10 reps - Mini Squat with Counter Support  - 1 x daily - 7 x weekly - 2 sets - 10 reps - Heel Raises with Counter Support  - 1 x daily - 7 x weekly - 2 sets - 10 reps - Side Stepping with Counter Support  - 1 x daily - 7 x weekly - 3 sets - 10 reps - Supine Hip Adduction Isometric with Ball  - 1 x daily - 7 x weekly - 1 sets - 10 reps - 5 hold - Supine Bridge with Spinal Articulation  - 1 x daily - 7 x weekly - 1 sets - 10 reps - Hooklying Clamshell with Resistance  - 1 x daily - 7 x weekly - 1 sets - 10 reps  ASSESSMENT:  CLINICAL IMPRESSION: Patient had some pain with  open chain hip abd at counter so changed it to sidestepping along counter today.  She has a history of LBP which is starting to bother her so we incorporated some pelvic tilts and core activation with hip strength in supine which was relieving to her back.  She performed a today to establish a baseline without an AD but with CG and gait belt by PT due to Pt feeling fearful and unsure.  PT updated HEP today.  OBJECTIVE IMPAIRMENTS: decreased balance, decreased mobility, difficulty walking, decreased strength, increased muscle spasms, impaired flexibility, and pain.   ACTIVITY LIMITATIONS: lifting, bending, standing, squatting, sleeping, and stairs  PARTICIPATION LIMITATIONS: cleaning, laundry, driving, community activity, and church  PERSONAL FACTORS: Past/current experiences and 3+ comorbidities: osteopenia, Hx of left ankle Fx, s/p R THA on 08/25/2023  are also affecting patient's functional outcome.   REHAB POTENTIAL: Good  CLINICAL DECISION MAKING: Evolving/moderate complexity  EVALUATION COMPLEXITY: Moderate   GOALS: Goals reviewed with patient? Yes  SHORT TERM GOALS: Target date: 10/10/2023 Patient will be independent with initial HEP. Baseline: Goal status: INITIAL  2.  Patient will participate in 3 or 6 minute walk test to establish a baseline. Baseline:  Goal status: INITIAL  LONG TERM GOALS: Target date: 11/14/2023  Patient will be independent with advanced HEP to allow for self progression after discharge. Baseline:  Goal status: INITIAL  2.  Patient will increase right hip strength to [facility] to allow her to navigate stairs in her home with reciprocal pattern. Baseline:  Goal status: INITIAL  3.  Patient will be able to walk for at least 15 minutes with  LRAD to allow for return to community ambulation. Baseline:  Goal status: INITIAL  4.  Patient will improve Lower Extremity Functional Scale to at least 50% to demonstrate improved functional mobility. Baseline:  37.5% Goal status: INITIAL  5.  Patient will report ability to resume aquatic aerobics class and going on outings with her friends without increased pain. Baseline:  Goal status: INITIAL   PLAN:  PT FREQUENCY: 2x/week  PT DURATION: 8 weeks  PLANNED INTERVENTIONS: 97164- PT Re-evaluation, 97110-Therapeutic exercises, 97530- Therapeutic activity, 97112- Neuromuscular re-education, 97535- Self Care, 16109- Manual therapy, [phone]- Gait training, [phone]- Aquatic Therapy, 97014- Electrical stimulation (unattended), [phone]- Electrical stimulation (manual), [id number]- Vasopneumatic device, [id number]- Ultrasound, [id number]- Traction (mechanical), [id number]- Ionotophoresis 4mg /ml Dexamethasone, Patient/Family education, Balance training, Stair training, Taping, Dry Needling, Joint mobilization, Joint manipulation, Spinal manipulation, Spinal mobilization, Scar mobilization, Cryotherapy, and Moist heat  PLAN FOR NEXT SESSION: NuStep, walking endurance and confidence, Assess and progress HEP as indicated, strengthening, flexibility, manual/dry needling as indicated    Morton Peters, PT 09/26/23 12:06 PM   [facility] [address], [address] [place], [place] [address] Phone # [phone] Fax [phone]

## 2023-09-30 ENCOUNTER — Ambulatory Visit: Payer: Medicare Other | Admitting: Rehabilitative and Restorative Service Providers"

## 2023-09-30 ENCOUNTER — Encounter: Payer: Self-pay | Admitting: Rehabilitative and Restorative Service Providers"

## 2023-09-30 DIAGNOSIS — M25551 Pain in right hip: Secondary | ICD-10-CM

## 2023-09-30 DIAGNOSIS — M6281 Muscle weakness (generalized): Secondary | ICD-10-CM

## 2023-09-30 DIAGNOSIS — R252 Cramp and spasm: Secondary | ICD-10-CM

## 2023-09-30 DIAGNOSIS — R262 Difficulty in walking, not elsewhere classified: Secondary | ICD-10-CM

## 2023-09-30 NOTE — Therapy (Addendum)
OUTPATIENT PHYSICAL THERAPY TREATMENT NOTE   Patient Name: Linda [name] MRN: [id number] DOB:28-Nov-1940, 83 y.o., female Today's Date: 09/30/2023  END OF SESSION:  PT End of Session - 09/30/23 1107     Visit Number 3    Date for PT Re-Evaluation 11/14/23    Authorization Type BC/BS Medicare    Progress Note Due on Visit 10    PT Start Time 1058    PT Stop Time 1140    PT Time Calculation (min) 42 min    Activity Tolerance Patient tolerated treatment well    Behavior During Therapy WFL for tasks assessed/performed              Past Medical History:  Diagnosis Date   ALLERGIC RHINITIS    Allergy    ANEMIA-IRON DEFICIENCY    Anxiety state, unspecified    Carpal tunnel syndrome    pt denies    Cataract    Closed left ankle fracture    COLONIC POLYPS, HX OF 2007   GERD    GLAUCOMA    Glaucoma    GOITER, MULTINODULAR    on [place] 2006, unchanged 6/13 with nodules all <97mm   HIATAL HERNIA WITH REFLUX    Hip dysplasia, acquired    B sx; eval at [facility] for same 01/2015 -ongoing PT   OSTEOARTHRITIS    Osteoporosis    Serrated polyp of colon    TOBACCO USE, QUIT    Past Surgical History:  Procedure Laterality Date   BREAST SURGERY  1960   Left breast cyst removed no complications   CARPAL TUNNEL RELEASE Right    COLONOSCOPY  2005   EYE SURGERY  1991-1992   both eyes   EYE SURGERY Left 2014   shunt behind left eye   ORIF FIBULA FRACTURE Left 08/24/2019   Procedure: OPEN REDUCTION INTERNAL FIXATION (ORIF) LEFT DISTAL FIBULA FRACTURE;  Surgeon: Teryl Lucy, MD;  Location: [facility];  Service: Orthopedics;  Laterality: Left;   Right shoulder aurgery  10/2008   TOTAL HIP ARTHROPLASTY Right 08/25/2023   Procedure: RIGHT TOTAL HIP REPLACEMENT;  Surgeon: Tarry Kos, MD;  Location: [facility] OR;  Service: Orthopedics;  Laterality: Right;  3-C   Patient Active Problem List   Diagnosis Date Noted   Status post total replacement of right hip 08/25/2023    Degenerative joint disease of right hip 08/24/2023   Spinal stenosis of lumbar region 10/09/2022   Facet arthritis of lumbar region 10/09/2022   Chronic bilateral low back pain without sciatica 10/04/2022   Osteopenia 11/26/2021   Pre-diabetes 11/26/2021   Other fatigue 07/28/2020   Dupuytren's disease of palm 01/19/2018   Viral URI 01/23/2017   Bilateral sensorineural hearing loss 03/12/2016   Routine general medical examination at a health care facility 07/22/2015   Stress fracture of neck of right femur 01/25/2015   Anemia 11/06/2012   Anxiety 11/06/2012   Depression 11/06/2012   Cataract, nuclear 10/28/2012   Primary open angle glaucoma of both eyes, indeterminate stage 10/28/2012   Ptosis of eyelid 10/28/2012   Bladder prolapse, female, acquired 01/09/2012   GERD (gastroesophageal reflux disease) 05/21/2007    PCP: Myrlene Broker, MD  REFERRING PROVIDER: Cristie Hem, PA-C  REFERRING DIAG: [phone] (ICD-10-CM) - Status post total replacement of right hip  THERAPY DIAG:  Difficulty in walking, not elsewhere classified  Muscle weakness (generalized)  Pain in right hip  Cramp and spasm  Rationale for Evaluation and Treatment: Rehabilitation  ONSET  DATE: S/P Right THA on 08/25/2023  SUBJECTIVE:   SUBJECTIVE STATEMENT: Patient reports that she was able to go to the [employer] show 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  PERTINENT HISTORY: Hx of back pain, R THA on 08/25/23, OA, Hx of Left ankle fracture in 2021, osteopenia  PAIN:  Are you having pain? Yes: NPRS scale: 2-4/10 Pain location: right hip Pain description: aching Aggravating factors: worse in the morning and with prolonged standing Relieving factors: movement  PRECAUTIONS: None  RED FLAGS: None   WEIGHT BEARING RESTRICTIONS: No  FALLS:  Has patient fallen in last 6 months? Yes. Number of falls 1 fall before surgery when she missed a step and fell at her  daughter's home  LIVING ENVIRONMENT: Lives with: lives alone Lives in: House/apartment Stairs:  two story home Has following equipment at home: Single point cane, Environmental consultant - 2 wheeled, shower chair, and bed side commode  OCCUPATION: Retired  PLOF: Independent and Vocation/Vocational requirements: water aerobics, church activities  PATIENT GOALS: To be independent again and return to driving and community activities.  NEXT MD VISIT: Osteoporosis Clinic with [place] Persons, PA on 09/22/23  OBJECTIVE:  Note: Objective measures were completed at Evaluation unless otherwise noted.  DIAGNOSTIC FINDINGS:  Pelvic Radiograph on 08/25/2023: IMPRESSION: Right hip arthroplasty without immediate postoperative complication.  PATIENT SURVEYS:  Eval:  LEFS 30 / 80 = 37.5 %  COGNITION: Overall cognitive status: Within functional limits for tasks assessed     SENSATION: Patient states some numbness at incision site.  MUSCLE LENGTH: Hamstrings: Right tightness noted  POSTURE: rounded shoulders and forward head  PALPATION: Tender to palpation along incision site  LOWER EXTREMITY ROM:  WFL  LOWER EXTREMITY MMT:  Eval:   Right hip strength grossly 4 to 4+/5 Right quad and hamstring strength of 5-/5 Left LE strength is WFL   FUNCTIONAL TESTS:  Eval: 5 times sit to stand: 21.66 with required use of UE pushing chair handles Timed up and go (TUG): 20.66 sec with RW  09/26/23:  3 minute walk: covered 73' with gait belt and no AD  GAIT: Distance walked: >200 ft Assistive device utilized: Environmental consultant - 2 wheeled Level of assistance: Modified independence Comments: Pt with antalgic gait pattern                                                                                                                                TODAY'S TREATMENT   DATE: 09/30/2023 Nustep level 3 x8 min with PT present to discuss status Seated hamstring stretch 2x20 sec bilat Sit to/from stand with UE  support x5 Sit to/from stand in chair with 2 foam cushions x10 (no UE support) Standing at counter:  hip abduction and hip extension (with small motion) Hooklying hip adduction ball squeeze 2x10 Supine posterior pelvic tilt 2x10 Supine articulating bridge 2x10 Hooklying marching with TA activation.  2x10 Review log rolling for bed mobility  and then properly sized 3-wheeled walker   DATE: 09/26/2023 covered 244' with gait belt and no AD In parallel bars: high knee march x20, sidestepping 3 rounds, Rt hip ext x10, 1/4 squats 2x10, heel/toe raises x10 Supine pelvic tilt with adductor ball squeeze 10x3" Supine articulating bridge x10 Supine hip abd red loop x10 Sit to stand with bil UE assistance x10 NuStep L2 x 4' PT present to monitor and update HEP  DATE: 09/18/2023  Issued HEP and reviewed standing exercises Reviewed abdominal massage for constipation prevention and provided handout   PATIENT EDUCATION:  Education details: Issued HEP Person educated: Patient Education method: Explanation, Facilities manager, and Handouts Education comprehension: verbalized understanding and returned demonstration  HOME EXERCISE PROGRAM: Access Code: [email or web address] URL: [email or web address] Date: 09/26/2023 Prepared by: Morton Peters  Exercises - Standing March with Counter Support  - 1 x daily - 7 x weekly - 2 sets - 10 reps - Standing Hip Abduction with Counter Support  - 1 x daily - 7 x weekly - 2 sets - 10 reps - Standing Hip Extension with Counter Support  - 1 x daily - 7 x weekly - 2 sets - 10 reps - Mini Squat with Counter Support  - 1 x daily - 7 x weekly - 2 sets - 10 reps - Heel Raises with Counter Support  - 1 x daily - 7 x weekly - 2 sets - 10 reps - Side Stepping with Counter Support  - 1 x daily - 7 x weekly - 3 sets - 10 reps - Supine Hip Adduction Isometric with Ball  - 1 x daily - 7 x weekly - 1 sets - 10 reps - 5 hold - Supine Bridge with Spinal Articulation  - 1  x daily - 7 x weekly - 1 sets - 10 reps - Hooklying Clamshell with Resistance  - 1 x daily - 7 x weekly - 1 sets - 10 reps  ASSESSMENT:  CLINICAL IMPRESSION: Ms Bertram presents to skilled PT stating that she is still having hip pain and some exercises seem to be more difficult than others.  Patient provided cuing on standing exercises to limit motion and to do more of a tap instead of a swing.  Patient reported that she did have less pain with modifications.  Patient with most difficulty when doing activity on left leg secondary to needing to bear weight through right LE.  Patient reported that the hamstring stretches today felt great.  Lowered handles on 3 wheeled walker to a more appropriate level given patient's height and she reported that this adjustment felt better and provided less strain on her back.  Patient goes for follow up with Dr Roda Shutters net week.  OBJECTIVE IMPAIRMENTS: decreased balance, decreased mobility, difficulty walking, decreased strength, increased muscle spasms, impaired flexibility, and pain.   ACTIVITY LIMITATIONS: lifting, bending, standing, squatting, sleeping, and stairs  PARTICIPATION LIMITATIONS: cleaning, laundry, driving, community activity, and church  PERSONAL FACTORS: Past/current experiences and 3+ comorbidities: osteopenia, Hx of left ankle Fx, s/p R THA on 08/25/2023  are also affecting patient's functional outcome.   REHAB POTENTIAL: Good  CLINICAL DECISION MAKING: Evolving/moderate complexity  EVALUATION COMPLEXITY: Moderate   GOALS: Goals reviewed with patient? Yes  SHORT TERM GOALS: Target date: 10/10/2023 Patient will be independent with initial HEP. Baseline: Goal status: Met  2.  Patient will participate in 3 or 6 minute walk test to establish a baseline. Baseline:  Goal status: Met  LONG TERM GOALS: Target date: 11/14/2023  Patient will be independent with advanced HEP to allow for self progression after discharge. Baseline:  Goal status:  INITIAL  2.  Patient will increase right hip strength to [facility] to allow her to navigate stairs in her home with reciprocal pattern. Baseline:  Goal status: INITIAL  3.  Patient will be able to walk for at least 15 minutes with LRAD to allow for return to community ambulation. Baseline:  Goal status: INITIAL  4.  Patient will improve Lower Extremity Functional Scale to at least 50% to demonstrate improved functional mobility. Baseline: 37.5% Goal status: INITIAL  5.  Patient will report ability to resume aquatic aerobics class and going on outings with her friends without increased pain. Baseline:  Goal status: INITIAL   PLAN:  PT FREQUENCY: 2x/week  PT DURATION: 8 weeks  PLANNED INTERVENTIONS: 97164- PT Re-evaluation, 97110-Therapeutic exercises, 97530- Therapeutic activity, 97112- Neuromuscular re-education, 97535- Self Care, 32440- Manual therapy, [phone]- Gait training, [phone]- Aquatic Therapy, 97014- Electrical stimulation (unattended), [phone]- Electrical stimulation (manual), [id number]- Vasopneumatic device, [id number]- Ultrasound, [id number]- Traction (mechanical), [id number]- Ionotophoresis 4mg /ml Dexamethasone, Patient/Family education, Balance training, Stair training, Taping, Dry Needling, Joint mobilization, Joint manipulation, Spinal manipulation, Spinal mobilization, Scar mobilization, Cryotherapy, and Moist heat  PLAN FOR NEXT SESSION: NuStep, walking endurance and confidence, Assess and progress HEP as indicated, strengthening, flexibility, manual/dry needling as indicated    Reather Laurence, PT, DPT 09/30/23, 12:02 PM  [facility] [address], [address] [place], [place] [address] Phone # [phone] Fax [phone]

## 2023-09-30 NOTE — Patient Instructions (Signed)
   [facility] Welcome to [facility]! Here you will find all the information you will need regarding your pool therapy. If you have further questions at any time, please call our office at [phone]. After completing your initial evaluation in the [facility], you may be eligible to complete a portion of your therapy in the pool. A typical week of therapy will consist of 1-2 typical physical therapy visits at our [facility] location and an additional session of therapy in the pool located at the [facility] at [facility]. [address] [place] [address]. The phone number at the pool site is [phone]. Please call this number if you are running late or need to cancel your appointment.  Aquatic therapy will be offered on Wednesday mornings and Friday afternoons. Each session will last approximately 45 minutes. All scheduling and payments for aquatic therapy sessions, including cancelations, will be done through our [facility] location.  To be eligible for aquatic therapy, these criteria must be met: You must be able to independently change in the locker room and get to the pool deck. A caregiver can come with you to help if needed. There are benches for a caregiver to sit on next to the pool. No one with an open wound is permitted in the pool.  Handicap parking is available in the front and there is a drop off option for even closer accessibility. Please arrive 15 minutes prior to your appointment to prepare for your pool session. You must sign in at the front desk upon your arrival. Please be sure to attend to any toileting needs prior to entering the pool. Locker rooms for changing are available.  There is direct access to the pool deck from the locker room. You can lock your belongings in a locker or bring them with you poolside. Your therapist will greet you on the pool deck. There may be other swimmers in the pool at the  same time but your session is one-on-one with the therapist.

## 2023-10-03 ENCOUNTER — Encounter: Payer: Medicare Other | Admitting: Physical Therapy

## 2023-10-06 ENCOUNTER — Encounter: Payer: Self-pay | Admitting: Rehabilitative and Restorative Service Providers"

## 2023-10-06 ENCOUNTER — Ambulatory Visit: Payer: Medicare Other | Admitting: Rehabilitative and Restorative Service Providers"

## 2023-10-06 DIAGNOSIS — M6281 Muscle weakness (generalized): Secondary | ICD-10-CM

## 2023-10-06 DIAGNOSIS — M25551 Pain in right hip: Secondary | ICD-10-CM

## 2023-10-06 DIAGNOSIS — R252 Cramp and spasm: Secondary | ICD-10-CM

## 2023-10-06 DIAGNOSIS — R262 Difficulty in walking, not elsewhere classified: Secondary | ICD-10-CM

## 2023-10-06 NOTE — Progress Notes (Incomplete)
 Post-Op Visit Note   Patient: Linda [name]           Date of Birth: 11-Sep-1940           MRN: [id number] Visit Date: 10/07/2023 PCP: Myrlene Broker, MD   Assessment & Plan:  Chief Complaint: No chief complaint on file. Visit Diagnoses: No diagnosis found.  Plan: ***  Follow-Up Instructions: No follow-ups on file.   Orders:  No orders of the defined types were placed in this encounter. No orders of the defined types were placed in this encounter.  Imaging: No results found.  PMFS History: Patient Active Problem List   Diagnosis Date Noted  . Status post total replacement of right hip 08/25/2023  . Degenerative joint disease of right hip 08/24/2023  . Spinal stenosis of lumbar region 10/09/2022  . Facet arthritis of lumbar region 10/09/2022  . Chronic bilateral low back pain without sciatica 10/04/2022  . Osteopenia 11/26/2021  . Pre-diabetes 11/26/2021  . Other fatigue 07/28/2020  . Dupuytren's disease of palm 01/19/2018  . Viral URI 01/23/2017  . Bilateral sensorineural hearing loss 03/12/2016  . Routine general medical examination at a health care facility 07/22/2015  . Stress fracture of neck of right femur 01/25/2015  . Anemia 11/06/2012  . Anxiety 11/06/2012  . Depression 11/06/2012  . Cataract, nuclear 10/28/2012  . Primary open angle glaucoma of both eyes, indeterminate stage 10/28/2012  . Ptosis of eyelid 10/28/2012  . Bladder prolapse, female, acquired 01/09/2012  . GERD (gastroesophageal reflux disease) 05/21/2007   Past Medical History:  Diagnosis Date  . ALLERGIC RHINITIS   . Allergy   . ANEMIA-IRON DEFICIENCY   . Anxiety state, unspecified   . Carpal tunnel syndrome    pt denies   . Cataract   . Closed left ankle fracture   . COLONIC POLYPS, HX OF 2007  . GERD   . GLAUCOMA   . Glaucoma   . GOITER, MULTINODULAR    on [place] 2006, unchanged 6/13 with nodules all <59mm  . HIATAL HERNIA WITH REFLUX   . Hip dysplasia, acquired    B  sx; eval at [facility] for same 01/2015 -ongoing PT  . OSTEOARTHRITIS   . Osteoporosis   . Serrated polyp of colon   . TOBACCO USE, QUIT     Family History  Problem Relation Age of Onset  . Diabetes Mother   . Heart disease Mother   . Arthritis Mother   . Hypertension Mother   . Arthritis Father   . Colon cancer Paternal Grandmother   . Esophageal cancer Neg Hx   . Rectal cancer Neg Hx   . Stomach cancer Neg Hx     Past Surgical History:  Procedure Laterality Date  . BREAST SURGERY  1960   Left breast cyst removed no complications  . CARPAL TUNNEL RELEASE Right   . COLONOSCOPY  2005  . EYE SURGERY  1991-1992   both eyes  . EYE SURGERY Left 2014   shunt behind left eye  . ORIF FIBULA FRACTURE Left 08/24/2019   Procedure: OPEN REDUCTION INTERNAL FIXATION (ORIF) LEFT DISTAL FIBULA FRACTURE;  Surgeon: Teryl Lucy, MD;  Location: [facility];  Service: Orthopedics;  Laterality: Left;  . Right shoulder aurgery  10/2008  . TOTAL HIP ARTHROPLASTY Right 08/25/2023   Procedure: RIGHT TOTAL HIP REPLACEMENT;  Surgeon: Tarry Kos, MD;  Location: [facility];  Service: Orthopedics;  Laterality: Right;  3-C   Social History   Occupational  History  . Not on file  Tobacco Use  . Smoking status: Former    Current packs/day: 0.00    Types: Cigarettes    Quit date: 08/12/1988    Years since quitting: 35.1  . Smokeless tobacco: Never  Vaping Use  . Vaping status: Never Used  Substance and Sexual Activity  . Alcohol use: Yes    Alcohol/week: 0.0 standard drinks of alcohol    Comment: occassionally  . Drug use: No  . Sexual activity: Not Currently    Birth control/protection: Post-menopausal

## 2023-10-06 NOTE — Therapy (Signed)
 OUTPATIENT PHYSICAL THERAPY TREATMENT NOTE   Patient Name: Linda [name] MRN: [id number] DOB:06-27-1941, 83 y.o., female Today's Date: 10/06/2023  END OF SESSION:  PT End of Session - 10/06/23 1149     Visit Number 4    Date for PT Re-Evaluation 11/14/23    Authorization Type BC/BS Medicare    Progress Note Due on Visit 10    PT Start Time 1145    PT Stop Time 1225    PT Time Calculation (min) 40 min    Activity Tolerance Patient tolerated treatment well    Behavior During Therapy WFL for tasks assessed/performed              Past Medical History:  Diagnosis Date   ALLERGIC RHINITIS    Allergy    ANEMIA-IRON DEFICIENCY    Anxiety state, unspecified    Carpal tunnel syndrome    pt denies    Cataract    Closed left ankle fracture    COLONIC POLYPS, HX OF 2007   GERD    GLAUCOMA    Glaucoma    GOITER, MULTINODULAR    on [place] 2006, unchanged 6/13 with nodules all <33mm   HIATAL HERNIA WITH REFLUX    Hip dysplasia, acquired    B sx; eval at [facility] for same 01/2015 -ongoing PT   OSTEOARTHRITIS    Osteoporosis    Serrated polyp of colon    TOBACCO USE, QUIT    Past Surgical History:  Procedure Laterality Date   BREAST SURGERY  1960   Left breast cyst removed no complications   CARPAL TUNNEL RELEASE Right    COLONOSCOPY  2005   EYE SURGERY  1991-1992   both eyes   EYE SURGERY Left 2014   shunt behind left eye   ORIF FIBULA FRACTURE Left 08/24/2019   Procedure: OPEN REDUCTION INTERNAL FIXATION (ORIF) LEFT DISTAL FIBULA FRACTURE;  Surgeon: Teryl Lucy, MD;  Location: [facility];  Service: Orthopedics;  Laterality: Left;   Right shoulder aurgery  10/2008   TOTAL HIP ARTHROPLASTY Right 08/25/2023   Procedure: RIGHT TOTAL HIP REPLACEMENT;  Surgeon: Tarry Kos, MD;  Location: [facility] OR;  Service: Orthopedics;  Laterality: Right;  3-C   Patient Active Problem List   Diagnosis Date Noted   Status post total replacement of right hip 08/25/2023    Degenerative joint disease of right hip 08/24/2023   Spinal stenosis of lumbar region 10/09/2022   Facet arthritis of lumbar region 10/09/2022   Chronic bilateral low back pain without sciatica 10/04/2022   Osteopenia 11/26/2021   Pre-diabetes 11/26/2021   Other fatigue 07/28/2020   Dupuytren's disease of palm 01/19/2018   Viral URI 01/23/2017   Bilateral sensorineural hearing loss 03/12/2016   Routine general medical examination at a health care facility 07/22/2015   Stress fracture of neck of right femur 01/25/2015   Anemia 11/06/2012   Anxiety 11/06/2012   Depression 11/06/2012   Cataract, nuclear 10/28/2012   Primary open angle glaucoma of both eyes, indeterminate stage 10/28/2012   Ptosis of eyelid 10/28/2012   Bladder prolapse, female, acquired 01/09/2012   GERD (gastroesophageal reflux disease) 05/21/2007    PCP: Myrlene Broker, MD  REFERRING PROVIDER: Cristie Hem, PA-C  REFERRING DIAG: [phone] (ICD-10-CM) - Status post total replacement of right hip  THERAPY DIAG:  Difficulty in walking, not elsewhere classified  Muscle weakness (generalized)  Pain in right hip  Cramp and spasm  Rationale for Evaluation and Treatment: Rehabilitation  ONSET  DATE: S/P Right THA on 08/25/2023  SUBJECTIVE:   SUBJECTIVE STATEMENT: Patient reports that she is generally feeling better, but reports that she continues to have excruciating pain at times when he has been sitting for prolonged times and goes to standing.  PERTINENT HISTORY: Hx of back pain, R THA on 08/25/23, OA, Hx of Left ankle fracture in 2021, osteopenia  PAIN:  Are you having pain? Yes: NPRS scale: 2-4/10 Pain location: right hip Pain description: aching Aggravating factors: worse in the morning and with prolonged standing Relieving factors: movement  PRECAUTIONS: None  RED FLAGS: None   WEIGHT BEARING RESTRICTIONS: No  FALLS:  Has patient fallen in last 6 months? Yes. Number of falls 1 fall  before surgery when she missed a step and fell at her daughter's home  LIVING ENVIRONMENT: Lives with: lives alone Lives in: House/apartment Stairs:  two story home Has following equipment at home: Single point cane, Environmental consultant - 2 wheeled, shower chair, and bed side commode  OCCUPATION: Retired  PLOF: Independent and Vocation/Vocational requirements: water aerobics, church activities  PATIENT GOALS: To be independent again and return to driving and community activities.  NEXT MD VISIT: Osteoporosis Clinic with [place] Persons, PA on 09/22/23  OBJECTIVE:  Note: Objective measures were completed at Evaluation unless otherwise noted.  DIAGNOSTIC FINDINGS:  Pelvic Radiograph on 08/25/2023: IMPRESSION: Right hip arthroplasty without immediate postoperative complication.  PATIENT SURVEYS:  Eval:  LEFS 30 / 80 = 37.5 %  COGNITION: Overall cognitive status: Within functional limits for tasks assessed     SENSATION: Patient states some numbness at incision site.  MUSCLE LENGTH: Hamstrings: Right tightness noted  POSTURE: rounded shoulders and forward head  PALPATION: Tender to palpation along incision site  LOWER EXTREMITY ROM:  WFL  LOWER EXTREMITY MMT:  Eval:   Right hip strength grossly 4 to 4+/5 Right quad and hamstring strength of 5-/5 Left LE strength is WFL   FUNCTIONAL TESTS:  Eval: 5 times sit to stand: 21.66 with required use of UE pushing chair handles Timed up and go (TUG): 20.66 sec with RW  09/26/23:  3 minute walk: covered 44' with gait belt and no AD  GAIT: Distance walked: >200 ft Assistive device utilized: Environmental consultant - 2 wheeled Level of assistance: Modified independence Comments: Pt with antalgic gait pattern                                                                                                                                TODAY'S TREATMENT   DATE: 10/06/2023 Nustep level 5 x6 min with PT present to discuss status Seated hamstring  stretch 2x20 sec bilat Sit to/from stand in chair with 2 foam cushions x10 (no UE support) Standing at counter:  hip abduction and hip extension (with small motion) x10 each bilat Hooklying hip adduction ball squeeze 2x10 Hooklying clamshell with red loop 2x10 Hooklying marching with TA activation.  2x10 Side-lying manual therapy using roller  bar to right thigh to promote decreased pain and decreased muscle tightness   DATE: 09/30/2023 Nustep level 3 x8 min with PT present to discuss status Seated hamstring stretch 2x20 sec bilat Sit to/from stand with UE support x5 Sit to/from stand in chair with 2 foam cushions x10 (no UE support) Standing at counter:  hip abduction and hip extension (with small motion) Hooklying hip adduction ball squeeze 2x10 Supine posterior pelvic tilt 2x10 Supine articulating bridge 2x10 Hooklying marching with TA activation.  2x10 Review log rolling for bed mobility and then properly sized 3-wheeled walker   DATE: 09/26/2023 covered 244' with gait belt and no AD In parallel bars: high knee march x20, sidestepping 3 rounds, Rt hip ext x10, 1/4 squats 2x10, heel/toe raises x10 Supine pelvic tilt with adductor ball squeeze 10x3" Supine articulating bridge x10 Supine hip abd red loop x10 Sit to stand with bil UE assistance x10 NuStep L2 x 4' PT present to monitor and update HEP    PATIENT EDUCATION:  Education details: Issued HEP Person educated: Patient Education method: Explanation, Demonstration, and Handouts Education comprehension: verbalized understanding and returned demonstration  HOME EXERCISE PROGRAM: Access Code: [email or web address] URL: [email or web address] Date: 09/26/2023 Prepared by: Morton Peters  Exercises - Standing March with Counter Support  - 1 x daily - 7 x weekly - 2 sets - 10 reps - Standing Hip Abduction with Counter Support  - 1 x daily - 7 x weekly - 2 sets - 10 reps - Standing Hip Extension with Counter Support   - 1 x daily - 7 x weekly - 2 sets - 10 reps - Mini Squat with Counter Support  - 1 x daily - 7 x weekly - 2 sets - 10 reps - Heel Raises with Counter Support  - 1 x daily - 7 x weekly - 2 sets - 10 reps - Side Stepping with Counter Support  - 1 x daily - 7 x weekly - 3 sets - 10 reps - Supine Hip Adduction Isometric with Ball  - 1 x daily - 7 x weekly - 1 sets - 10 reps - 5 hold - Supine Bridge with Spinal Articulation  - 1 x daily - 7 x weekly - 1 sets - 10 reps - Hooklying Clamshell with Resistance  - 1 x daily - 7 x weekly - 1 sets - 10 reps  ASSESSMENT:  CLINICAL IMPRESSION: Ms Morici presents to skilled PT reporting that she has "excruciating pain" with transfers.  Patient with increased muscle tightness noted on right thigh.  Able to continues to progress with strengthening.  Patient is progressing overall with strengthening, but continues to have difficulty with standing exercises, especially with weight bearing on RLE.  Performed instrument assisted manual therapy at end of session secondary to patient having increased pain and tightness in her thigh.  Patient reported decreased pain with manual therapy, but pain returned with returning to stand.  Patient to clarify with Dr Roda Shutters about starting aquatic PT next week and the potential for starting dry needling if pain persists in her hip.   OBJECTIVE IMPAIRMENTS: decreased balance, decreased mobility, difficulty walking, decreased strength, increased muscle spasms, impaired flexibility, and pain.   ACTIVITY LIMITATIONS: lifting, bending, standing, squatting, sleeping, and stairs  PARTICIPATION LIMITATIONS: cleaning, laundry, driving, community activity, and church  PERSONAL FACTORS: Past/current experiences and 3+ comorbidities: osteopenia, Hx of left ankle Fx, s/p R THA on 08/25/2023  are also affecting patient's functional outcome.   REHAB POTENTIAL: Good  CLINICAL  DECISION MAKING: Evolving/moderate complexity  EVALUATION COMPLEXITY:  Moderate   GOALS: Goals reviewed with patient? Yes  SHORT TERM GOALS: Target date: 10/10/2023 Patient will be independent with initial HEP. Baseline: Goal status: Met  2.  Patient will participate in 3 or 6 minute walk test to establish a baseline. Baseline:  Goal status: Met  LONG TERM GOALS: Target date: 11/14/2023  Patient will be independent with advanced HEP to allow for self progression after discharge. Baseline:  Goal status: Ongoing  2.  Patient will increase right hip strength to [facility] to allow her to navigate stairs in her home with reciprocal pattern. Baseline:  Goal status: INITIAL  3.  Patient will be able to walk for at least 15 minutes with LRAD to allow for return to community ambulation. Baseline:  Goal status: INITIAL  4.  Patient will improve Lower Extremity Functional Scale to at least 50% to demonstrate improved functional mobility. Baseline: 37.5% Goal status: INITIAL  5.  Patient will report ability to resume aquatic aerobics class and going on outings with her friends without increased pain. Baseline:  Goal status: INITIAL   PLAN:  PT FREQUENCY: 2x/week  PT DURATION: 8 weeks  PLANNED INTERVENTIONS: 97164- PT Re-evaluation, 97110-Therapeutic exercises, 97530- Therapeutic activity, 97112- Neuromuscular re-education, 97535- Self Care, 16109- Manual therapy, [phone]- Gait training, [phone]- Aquatic Therapy, 97014- Electrical stimulation (unattended), [phone]- Electrical stimulation (manual), [id number]- Vasopneumatic device, [id number]- Ultrasound, [id number]- Traction (mechanical), [id number]- Ionotophoresis 4mg /ml Dexamethasone, Patient/Family education, Balance training, Stair training, Taping, Dry Needling, Joint mobilization, Joint manipulation, Spinal manipulation, Spinal mobilization, Scar mobilization, Cryotherapy, and Moist heat  PLAN FOR NEXT SESSION: NuStep, walking endurance and confidence, Assess and progress HEP as indicated, strengthening, flexibility, manual/dry  needling as indicated    Reather Laurence, PT, DPT 10/06/23, 1:28 PM  [facility] [address], [address] [place], [place] [address] Phone # [phone] Fax [phone]

## 2023-10-07 ENCOUNTER — Ambulatory Visit (INDEPENDENT_AMBULATORY_CARE_PROVIDER_SITE_OTHER): Payer: Medicare Other | Admitting: Orthopaedic Surgery

## 2023-10-07 ENCOUNTER — Encounter: Payer: Self-pay | Admitting: Orthopaedic Surgery

## 2023-10-07 ENCOUNTER — Other Ambulatory Visit (INDEPENDENT_AMBULATORY_CARE_PROVIDER_SITE_OTHER): Payer: Self-pay

## 2023-10-07 DIAGNOSIS — Z96641 Presence of right artificial hip joint: Secondary | ICD-10-CM

## 2023-10-09 ENCOUNTER — Telehealth: Payer: Self-pay

## 2023-10-09 NOTE — Telephone Encounter (Signed)
Only needs for 6 weeks po

## 2023-10-09 NOTE — Telephone Encounter (Signed)
Spoke with patient and relayed

## 2023-10-09 NOTE — Telephone Encounter (Signed)
Tried to call. No answer. Will try again later. 

## 2023-10-09 NOTE — Telephone Encounter (Signed)
 Patient called wanting to know if she should continue to take 2 baby aspirin.  CB# [phone].  Please advise.  Thank you.

## 2023-10-10 ENCOUNTER — Ambulatory Visit: Payer: Medicare Other | Admitting: Physical Therapy

## 2023-10-10 ENCOUNTER — Encounter: Payer: Self-pay | Admitting: Physical Therapy

## 2023-10-10 DIAGNOSIS — M6281 Muscle weakness (generalized): Secondary | ICD-10-CM

## 2023-10-10 DIAGNOSIS — M25551 Pain in right hip: Secondary | ICD-10-CM

## 2023-10-10 DIAGNOSIS — R252 Cramp and spasm: Secondary | ICD-10-CM

## 2023-10-10 DIAGNOSIS — R262 Difficulty in walking, not elsewhere classified: Secondary | ICD-10-CM

## 2023-10-10 NOTE — Therapy (Signed)
 OUTPATIENT PHYSICAL THERAPY TREATMENT NOTE   Patient Name: Linda [name] MRN: [id number] DOB:August 31, 1940, 83 y.o., female Today's Date: 10/10/2023  END OF SESSION:  PT End of Session - 10/10/23 1106     Visit Number 5    Date for PT Re-Evaluation 11/14/23    Authorization Type BC/BS Medicare    Progress Note Due on Visit 10    PT Start Time 1102    PT Stop Time 1145    PT Time Calculation (min) 43 min    Activity Tolerance Patient tolerated treatment well    Behavior During Therapy WFL for tasks assessed/performed               Past Medical History:  Diagnosis Date   ALLERGIC RHINITIS    Allergy    ANEMIA-IRON DEFICIENCY    Anxiety state, unspecified    Carpal tunnel syndrome    pt denies    Cataract    Closed left ankle fracture    COLONIC POLYPS, HX OF 2007   GERD    GLAUCOMA    Glaucoma    GOITER, MULTINODULAR    on [place] 2006, unchanged 6/13 with nodules all <3mm   HIATAL HERNIA WITH REFLUX    Hip dysplasia, acquired    B sx; eval at [facility] for same 01/2015 -ongoing PT   OSTEOARTHRITIS    Osteoporosis    Serrated polyp of colon    TOBACCO USE, QUIT    Past Surgical History:  Procedure Laterality Date   BREAST SURGERY  1960   Left breast cyst removed no complications   CARPAL TUNNEL RELEASE Right    COLONOSCOPY  2005   EYE SURGERY  1991-1992   both eyes   EYE SURGERY Left 2014   shunt behind left eye   ORIF FIBULA FRACTURE Left 08/24/2019   Procedure: OPEN REDUCTION INTERNAL FIXATION (ORIF) LEFT DISTAL FIBULA FRACTURE;  Surgeon: Teryl Lucy, MD;  Location: [facility];  Service: Orthopedics;  Laterality: Left;   Right shoulder aurgery  10/2008   TOTAL HIP ARTHROPLASTY Right 08/25/2023   Procedure: RIGHT TOTAL HIP REPLACEMENT;  Surgeon: Tarry Kos, MD;  Location: [facility] OR;  Service: Orthopedics;  Laterality: Right;  3-C   Patient Active Problem List   Diagnosis Date Noted   Status post total replacement of right hip 08/25/2023    Degenerative joint disease of right hip 08/24/2023   Spinal stenosis of lumbar region 10/09/2022   Facet arthritis of lumbar region 10/09/2022   Chronic bilateral low back pain without sciatica 10/04/2022   Osteopenia 11/26/2021   Pre-diabetes 11/26/2021   Other fatigue 07/28/2020   Dupuytren's disease of palm 01/19/2018   Viral URI 01/23/2017   Bilateral sensorineural hearing loss 03/12/2016   Routine general medical examination at a health care facility 07/22/2015   Stress fracture of neck of right femur 01/25/2015   Anemia 11/06/2012   Anxiety 11/06/2012   Depression 11/06/2012   Cataract, nuclear 10/28/2012   Primary open angle glaucoma of both eyes, indeterminate stage 10/28/2012   Ptosis of eyelid 10/28/2012   Bladder prolapse, female, acquired 01/09/2012   GERD (gastroesophageal reflux disease) 05/21/2007    PCP: Myrlene Broker, MD  REFERRING PROVIDER: Cristie Hem, PA-C  REFERRING DIAG: [phone] (ICD-10-CM) - Status post total replacement of right hip  THERAPY DIAG:  Difficulty in walking, not elsewhere classified  Muscle weakness (generalized)  Pain in right hip  Cramp and spasm  Rationale for Evaluation and Treatment: Rehabilitation  ONSET DATE: S/P Right THA on 08/25/2023  SUBJECTIVE:   SUBJECTIVE STATEMENT: The roller on the hip helped and I had reduced pain as the day went on.  I can still get spikes of pain when I go to stand from sitting but I think it is chair dependent.  PERTINENT HISTORY: Hx of back pain, R THA on 08/25/23, OA, Hx of Left ankle fracture in 2021, osteopenia  PAIN:  Are you having pain? Yes: NPRS scale: 3/10 Pain location: right hip Pain description: aching Aggravating factors: worse in the morning and with prolonged standing Relieving factors: movement  PRECAUTIONS: None  RED FLAGS: None   WEIGHT BEARING RESTRICTIONS: No  FALLS:  Has patient fallen in last 6 months? Yes. Number of falls 1 fall before surgery  when she missed a step and fell at her daughter's home  LIVING ENVIRONMENT: Lives with: lives alone Lives in: House/apartment Stairs:  two story home Has following equipment at home: Single point cane, Environmental consultant - 2 wheeled, shower chair, and bed side commode  OCCUPATION: Retired  PLOF: Independent and Vocation/Vocational requirements: water aerobics, church activities  PATIENT GOALS: To be independent again and return to driving and community activities.  NEXT MD VISIT: Osteoporosis Clinic with [place] Persons, PA on 09/22/23  OBJECTIVE:  Note: Objective measures were completed at Evaluation unless otherwise noted.  DIAGNOSTIC FINDINGS:  Pelvic Radiograph on 08/25/2023: IMPRESSION: Right hip arthroplasty without immediate postoperative complication.  PATIENT SURVEYS:  Eval:  LEFS 30 / 80 = 37.5 %  COGNITION: Overall cognitive status: Within functional limits for tasks assessed     SENSATION: Patient states some numbness at incision site.  MUSCLE LENGTH: Hamstrings: Right tightness noted  POSTURE: rounded shoulders and forward head  PALPATION: Tender to palpation along incision site  LOWER EXTREMITY ROM:  WFL  LOWER EXTREMITY MMT:  Eval:   Right hip strength grossly 4 to 4+/5 Right quad and hamstring strength of 5-/5 Left LE strength is WFL   FUNCTIONAL TESTS:  Eval: 5 times sit to stand: 21.66 with required use of UE pushing chair handles Timed up and go (TUG): 20.66 sec with RW  09/26/23:  3 minute walk: covered 89' with gait belt and no AD  GAIT: Distance walked: >200 ft Assistive device utilized: Environmental consultant - 2 wheeled Level of assistance: Modified independence Comments: Pt with antalgic gait pattern                                                                                                                                TODAY'S TREATMENT  DATE: 10/10/2023 Nustep level 5 x7 min with PT present to discuss status Seated hamstring stretch 2x20 sec  bilat Seated ball rollouts for trunk flexion x10 Sit to/from stand from mat table with 2 foam cushions x10 (no UE support) At counter with bil UE support: Rt hip abd and ext At barre: Step over hurdle Rt LE shift weight into Rt foot and  return x5 At barre: Side step over hurdle Rt LE with weight shift into Rt foot and return x5 Lunge stance holding barre Rt foot back gentle rocking Rt hip flexor stretch x10 March to 90/90 at barre alt LE x20 with bil UE support Hooklying hip adduction ball squeeze 2x10 Hooklying clamshell with red loop 2x10 Side-lying manual therapy using roller bar to right thigh to promote decreased pain and decreased muscle tightness  DATE: 10/06/2023 Nustep level 5 x6 min with PT present to discuss status Seated hamstring stretch 2x20 sec bilat Sit to/from stand in chair with 2 foam cushions x10 (no UE support) Standing at counter:  hip abduction and hip extension (with small motion) x10 each bilat Hooklying hip adduction ball squeeze 2x10 Hooklying clamshell with red loop 2x10 Hooklying marching with TA activation.  2x10 Side-lying manual therapy using roller bar to right thigh to promote decreased pain and decreased muscle tightness   DATE: 09/30/2023 Nustep level 3 x8 min with PT present to discuss status Seated hamstring stretch 2x20 sec bilat Sit to/from stand with UE support x5 Sit to/from stand in chair with 2 foam cushions x10 (no UE support) Standing at counter:  hip abduction and hip extension (with small motion) Hooklying hip adduction ball squeeze 2x10 Supine posterior pelvic tilt 2x10 Supine articulating bridge 2x10 Hooklying marching with TA activation.  2x10 Review log rolling for bed mobility and then properly sized 3-wheeled walker  PATIENT EDUCATION:  Education details: Issued HEP Person educated: Patient Education method: Explanation, Demonstration, and Handouts Education comprehension: verbalized understanding and returned  demonstration  HOME EXERCISE PROGRAM: Access Code: [email or web address] URL: [email or web address] Date: 09/26/2023 Prepared by: Morton Peters  Exercises - Standing March with Counter Support  - 1 x daily - 7 x weekly - 2 sets - 10 reps - Standing Hip Abduction with Counter Support  - 1 x daily - 7 x weekly - 2 sets - 10 reps - Standing Hip Extension with Counter Support  - 1 x daily - 7 x weekly - 2 sets - 10 reps - Mini Squat with Counter Support  - 1 x daily - 7 x weekly - 2 sets - 10 reps - Heel Raises with Counter Support  - 1 x daily - 7 x weekly - 2 sets - 10 reps - Side Stepping with Counter Support  - 1 x daily - 7 x weekly - 3 sets - 10 reps - Supine Hip Adduction Isometric with Ball  - 1 x daily - 7 x weekly - 1 sets - 10 reps - 5 hold - Supine Bridge with Spinal Articulation  - 1 x daily - 7 x weekly - 1 sets - 10 reps - Hooklying Clamshell with Resistance  - 1 x daily - 7 x weekly - 1 sets - 10 reps  ASSESSMENT:  CLINICAL IMPRESSION: Ms Haser is demonstrating much improved active motion and strength surrounding her Rt hip.  She can still get some catching pain in anterior hip with hip flexor activation or with lengthening of anterior hip when going from sit to stand.  We added some gentle stretching of hip flexor before marching which eliminated the catch.  She grew fatigued with hurdle step overs with weight shift today so limited reps to 5 each way.  She did very well with use of soft tissue massage roller last visit to posterior, lateral and anterior Rt hip so repeated today.     OBJECTIVE IMPAIRMENTS: decreased balance, decreased mobility, difficulty walking, decreased strength, increased  muscle spasms, impaired flexibility, and pain.   ACTIVITY LIMITATIONS: lifting, bending, standing, squatting, sleeping, and stairs  PARTICIPATION LIMITATIONS: cleaning, laundry, driving, community activity, and church  PERSONAL FACTORS: Past/current experiences and 3+  comorbidities: osteopenia, Hx of left ankle Fx, s/p R THA on 08/25/2023  are also affecting patient's functional outcome.   REHAB POTENTIAL: Good  CLINICAL DECISION MAKING: Evolving/moderate complexity  EVALUATION COMPLEXITY: Moderate   GOALS: Goals reviewed with patient? Yes  SHORT TERM GOALS: Target date: 10/10/2023 Patient will be independent with initial HEP. Baseline: Goal status: Met  2.  Patient will participate in 3 or 6 minute walk test to establish a baseline. Baseline:  Goal status: Met  LONG TERM GOALS: Target date: 11/14/2023  Patient will be independent with advanced HEP to allow for self progression after discharge. Baseline:  Goal status: Ongoing  2.  Patient will increase right hip strength to [facility] to allow her to navigate stairs in her home with reciprocal pattern. Baseline:  Goal status: INITIAL  3.  Patient will be able to walk for at least 15 minutes with LRAD to allow for return to community ambulation. Baseline:  Goal status: INITIAL  4.  Patient will improve Lower Extremity Functional Scale to at least 50% to demonstrate improved functional mobility. Baseline: 37.5% Goal status: INITIAL  5.  Patient will report ability to resume aquatic aerobics class and going on outings with her friends without increased pain. Baseline:  Goal status: INITIAL   PLAN:  PT FREQUENCY: 2x/week  PT DURATION: 8 weeks  PLANNED INTERVENTIONS: 97164- PT Re-evaluation, 97110-Therapeutic exercises, 97530- Therapeutic activity, 97112- Neuromuscular re-education, 97535- Self Care, 16109- Manual therapy, [phone]- Gait training, [phone]- Aquatic Therapy, 97014- Electrical stimulation (unattended), [phone]- Electrical stimulation (manual), [id number]- Vasopneumatic device, [id number]- Ultrasound, [id number]- Traction (mechanical), [id number]- Ionotophoresis 4mg /ml Dexamethasone, Patient/Family education, Balance training, Stair training, Taping, Dry Needling, Joint mobilization, Joint manipulation, Spinal  manipulation, Spinal mobilization, Scar mobilization, Cryotherapy, and Moist heat  PLAN FOR NEXT SESSION: NuStep, walking endurance and confidence, Assess and progress HEP as indicated, strengthening, flexibility, manual/dry needling as indicated   Morton Peters, PT 10/10/23 11:54 AM   [facility] [address], [address] [place], [place] [address] Phone # [phone] Fax [phone]

## 2023-10-14 ENCOUNTER — Ambulatory Visit (INDEPENDENT_AMBULATORY_CARE_PROVIDER_SITE_OTHER): Payer: Medicare Other

## 2023-10-14 VITALS — BP 110/62 | HR 68 | Ht 62.0 in | Wt 129.2 lb

## 2023-10-14 DIAGNOSIS — Z0000 Encounter for general adult medical examination without abnormal findings: Secondary | ICD-10-CM

## 2023-10-14 NOTE — Patient Instructions (Addendum)
 Linda [name] , Thank you for taking time to come for your Medicare Wellness Visit. I appreciate your ongoing commitment to your health goals. Please review the following plan we discussed and let me know if I can assist you in the future.   Referrals/Orders/Follow-Ups/Clinician Recommendations: Aim for 30 minutes of exercise or brisk walking, 6-8 glasses of water, and 5 servings of fruits and vegetables each day.   This is a list of the screening recommended for you and due dates:  Health Maintenance  Topic Date Due   COVID-19 Vaccine (4 - 2024-25 season) 04/13/2023   DTaP/Tdap/Td vaccine (3 - Td or Tdap) 02/24/2024   Medicare Annual Wellness Visit  10/13/2024   Pneumonia Vaccine  Completed   Flu Shot  Completed   DEXA scan (bone density measurement)  Completed   Zoster (Shingles) Vaccine  Completed   HPV Vaccine  Aged Out   Colon Cancer Screening  Discontinued    Advanced directives: (In Chart) A copy of your advanced directives are scanned into your chart should your provider ever need it.  Next Medicare Annual Wellness Visit scheduled for next year: Yes - 2026

## 2023-10-14 NOTE — Progress Notes (Signed)
 Subjective:   Linda [name] is a 83 y.o. who presents for a Medicare Wellness preventive visit.  Visit Complete: In person  AWV Questionnaire: No: Patient Medicare AWV questionnaire was not completed prior to this visit.  Cardiac Risk Factors include: advanced age (>53men, >50 women)     Objective:    Today's Vitals   10/14/23 1459 10/14/23 1501  BP: 110/62   Pulse: 68   SpO2: 97%   Weight: 129 lb 3.2 oz (58.6 kg)   Height: 5\' 2"  (1.575 m)   PainSc:  3   PainLoc: Hip    Body mass index is 23.63 kg/m.     10/14/2023    2:59 PM 09/18/2023   10:14 AM 08/25/2023    8:15 PM 12/05/2022   11:17 AM 12/03/2021   12:03 PM 12/01/2020    3:36 PM 12/01/2019    2:39 PM  Advanced Directives  Does Patient Have a Medical Advance Directive? Yes Yes No Yes Yes Yes Yes  Type of Estate agent of [place];Living will Healthcare Power of [place];Living will  Healthcare Power of [place];Living will Living will;Healthcare Power of Attorney  Living will;Healthcare Power of Attorney  Does patient want to make changes to medical advance directive? No - Patient declined No - Patient declined  No - Patient declined No - Patient declined No - Patient declined No - Patient declined  Copy of Healthcare Power of Attorney in Chart? Yes - validated most recent copy scanned in chart (See row information) No - copy requested  Yes - validated most recent copy scanned in chart (See row information) Yes - validated most recent copy scanned in chart (See row information)  No - copy requested  Would patient like information on creating a medical advance directive?   No - Patient declined        Current Medications (verified) Outpatient Encounter Medications as of 10/14/2023  Medication Sig   aspirin EC 81 MG tablet Take 1 tablet (81 mg total) by mouth 2 (two) times daily. To be taken after surgery to prevent blood clots   b complex vitamins tablet Take 1 tablet by mouth daily.   busPIRone  (BUSPAR) 5 MG tablet Take 1 tablet (5 mg total) by mouth 2 (two) times daily as needed. Annual appt due in April must see provider for future refills (Patient taking differently: Take 5 mg by mouth daily. Annual appt due in April must see provider for future refills)   Calcium-Phosphorus-Vitamin D (CITRACAL +D3 PO) Take 1 tablet by mouth daily. 600mg  +1000 units twice daily   docusate sodium (COLACE) 100 MG capsule Take 1 capsule (100 mg total) by mouth daily as needed.   dorzolamide-timolol (COSOPT) 22.3-6.8 MG/ML ophthalmic solution Place 1 drop into both eyes 2 (two) times daily.   estradiol (ESTRACE) 0.1 MG/GM vaginal cream Place 1 Applicatorful vaginally 2 (two) times a week.   omeprazole (PRILOSEC) 40 MG capsule Take 1 capsule (40 mg total) by mouth daily.   oxyCODONE-acetaminophen (PERCOCET) 5-325 MG tablet Take 1-2 tablets by mouth every 6 (six) hours as needed.   sertraline (ZOLOFT) 100 MG tablet TAKE ONE TABLET BY MOUTH ONCE DAILY   triamcinolone cream (KENALOG) 0.1 % Apply 1 Application topically daily as needed (dry skin).   [DISCONTINUED] doxycycline (VIBRA-TABS) 100 MG tablet Take 1 tablet (100 mg total) by mouth 2 (two) times daily.   [DISCONTINUED] methocarbamol (ROBAXIN-750) 750 MG tablet Take 1 tablet (750 mg total) by mouth 2 (two) times daily as  needed for muscle spasms.   [DISCONTINUED] ondansetron (ZOFRAN) 4 MG tablet Take 1 tablet (4 mg total) by mouth every 8 (eight) hours as needed for nausea or vomiting.   [DISCONTINUED] traMADol (ULTRAM) 50 MG tablet Take 1 tablet (50 mg total) by mouth 3 (three) times daily as needed.   No facility-administered encounter medications on file as of 10/14/2023.    Allergies (verified) Patient has no known allergies.   History: Past Medical History:  Diagnosis Date   ALLERGIC RHINITIS    Allergy    ANEMIA-IRON DEFICIENCY    Anxiety state, unspecified    Carpal tunnel syndrome    pt denies    Cataract    Closed left ankle fracture     COLONIC POLYPS, HX OF 2007   GERD    GLAUCOMA    Glaucoma    GOITER, MULTINODULAR    on [place] 2006, unchanged 6/13 with nodules all <3mm   HIATAL HERNIA WITH REFLUX    Hip dysplasia, acquired    B sx; eval at [facility] for same 01/2015 -ongoing PT   OSTEOARTHRITIS    Osteoporosis    Serrated polyp of colon    TOBACCO USE, QUIT    Past Surgical History:  Procedure Laterality Date   BREAST SURGERY  1960   Left breast cyst removed no complications   CARPAL TUNNEL RELEASE Right    COLONOSCOPY  2005   EYE SURGERY  1991-1992   both eyes   EYE SURGERY Left 2014   shunt behind left eye   ORIF FIBULA FRACTURE Left 08/24/2019   Procedure: OPEN REDUCTION INTERNAL FIXATION (ORIF) LEFT DISTAL FIBULA FRACTURE;  Surgeon: Teryl Lucy, MD;  Location: [facility];  Service: Orthopedics;  Laterality: Left;   Right shoulder aurgery  10/2008   TOTAL HIP ARTHROPLASTY Right 08/25/2023   Procedure: RIGHT TOTAL HIP REPLACEMENT;  Surgeon: Tarry Kos, MD;  Location: [facility] OR;  Service: Orthopedics;  Laterality: Right;  3-C   Family History  Problem Relation Age of Onset   Diabetes Mother    Heart disease Mother    Arthritis Mother    Hypertension Mother    Arthritis Father    Colon cancer Paternal Grandmother    Esophageal cancer Neg Hx    Rectal cancer Neg Hx    Stomach cancer Neg Hx    Social History   Socioeconomic History   Marital status: Widowed    Spouse name: Not on file   Number of children: 2   Years of education: Not on file   Highest education level: Bachelor's degree (e.g., BA, AB, BS)  Occupational History   Not on file  Tobacco Use   Smoking status: Former    Current packs/day: 0.00    Types: Cigarettes    Quit date: 08/12/1988    Years since quitting: 35.1    Passive exposure: Past   Smokeless tobacco: Never  Vaping Use   Vaping status: Never Used  Substance and Sexual Activity   Alcohol use: Yes    Alcohol/week: 1.0 standard drink of alcohol    Types: 1  Glasses of wine per week    Comment: occassionally   Drug use: No   Sexual activity: Not Currently    Birth control/protection: Post-menopausal  Other Topics Concern   Not on file  Social History Narrative   Married,widowed; lives in a 2-story home   [employer]   Social Drivers of Health   Financial Resource Strain: Low Risk  (10/14/2023)  Overall Financial Resource Strain (CARDIA)    Difficulty of Paying Living Expenses: Not hard at all  Food Insecurity: No Food Insecurity (10/14/2023)   Hunger Vital Sign    Worried About Running Out of Food in the Last Year: Never true    Ran Out of Food in the Last Year: Never true  Transportation Needs: No Transportation Needs (10/14/2023)   PRAPARE - Administrator, Civil Service (Medical): No    Lack of Transportation (Non-Medical): No  Physical Activity: Insufficiently Active (10/14/2023)   Exercise Vital Sign    Days of Exercise per Week: 3 days    Minutes of Exercise per Session: 40 min  Stress: No Stress Concern Present (10/14/2023)   [employer] of Occupational Health - Occupational Stress Questionnaire    Feeling of Stress : Only a little  Social Connections: Moderately Integrated (10/14/2023)   Social Connection and Isolation Panel [NHANES]    Frequency of Communication with Friends and Family: More than three times a week    Frequency of Social Gatherings with Friends and Family: More than three times a week    Attends Religious Services: 1 to 4 times per year    Active Member of [employer] or Organizations: Yes    Attends Banker Meetings: More than 4 times per year    Marital Status: Widowed  Recent Concern: Social Connections - Moderately Isolated (08/25/2023)   Social Connection and Isolation Panel [NHANES]    Frequency of Communication with Friends and Family: Never    Frequency of Social Gatherings with Friends and Family: Twice a week    Attends Religious Services: More than 4 times per year    Active  Member of [employer] or Organizations: Yes    Attends Banker Meetings: More than 4 times per year    Marital Status: Widowed    Tobacco Counseling - Former Smoker Counseling given - Yes   Clinical Intake:  Pre-visit preparation completed: Yes  Pain : 0-10 Pain Score: 3  (had right hip surgery - undergoing physical therapy) Pain Type: Chronic pain Pain Location: Hip Pain Orientation: Right Pain Descriptors / Indicators: Constant Pain Onset: 1 to 4 weeks ago Pain Frequency: Intermittent Pain Relieving Factors: Ext Strength Tylenol  Pain Relieving Factors: Ext Strength Tylenol  BMI - recorded: 13.63 Nutritional Status: BMI of 19-24  Normal Nutritional Risks: None Diabetes: No  How often do you need to have someone help you when you read instructions, pamphlets, or other written materials from your doctor or pharmacy?: 1 - Never  Interpreter Needed?: No  Information entered by :: Hassell Halim, CMA   Activities of Daily Living     10/14/2023    3:05 PM 08/25/2023    9:05 AM  In your present state of health, do you have any difficulty performing the following activities:  Hearing? 0   Vision? 0   Difficulty concentrating or making decisions? 0   Walking or climbing stairs? 0   Dressing or bathing? 0   Doing errands, shopping? 0 0  Preparing Food and eating ? N   Using the Toilet? N   In the past six months, have you accidently leaked urine? N   Do you have problems with loss of bowel control? N   Managing your Medications? N   Managing your Finances? N   Housekeeping or managing your Housekeeping? N     Patient Care Team: Myrlene Broker, MD as PCP - General (Internal Medicine) Christia Reading,  MD as Consulting Physician Genia Del, MD (Obstetrics and Gynecology) Sheran Luz, MD (Physical Medicine and Rehabilitation) Bond, Doran Stabler, MD (Ophthalmology) Antony Contras, MD (Ophthalmology) Barnett Abu, MD as Consulting Physician  (Neurosurgery) Pyrtle, Carie Caddy, MD as Consulting Physician (Gastroenterology) Lemmons (Dentistry) Kathyrn Sheriff, [facility] (Inactive) as Pharmacist (Pharmacist)  Indicate any recent Medical Services you may have received from other than [facility] providers in the past year (date may be approximate).     Assessment:   This is a routine wellness examination for Magn[place].  Hearing/Vision screen Hearing Screening - Comments:: Wears bilateral hearing aids Vision Screening - Comments:: Wears rx glasses - up to date with routine eye exams with Dr Randon Goldsmith in [place], [place] &amp; Dr Lottie Dawson w/[facility]   Goals Addressed               This Visit's Progress     Patient Stated (pt-stated)        Patient stated she plans to continue physical therapy for her hip pain and heal.       Depression Screen     10/14/2023    3:13 PM 04/30/2023   10:25 AM 12/06/2022   10:25 AM 12/05/2022   11:16 AM 10/04/2022   10:40 AM 08/08/2022    9:08 AM 01/02/2022    3:37 PM  PHQ 2/9 Scores  PHQ - 2 Score 0 0 0 0 0 0 0  PHQ- 9 Score 0  1 0 0      Fall Risk     10/14/2023    3:06 PM 12/06/2022   10:24 AM 12/05/2022   11:18 AM 12/04/2022   11:41 AM 10/04/2022   10:40 AM  Fall Risk   Falls in the past year? 0 0 0 0 0  Number falls in past yr: 0 0 0 0 0  Injury with Fall? 0 0 0 0 0  Risk for fall due to : No Fall Risks No Fall Risks No Fall Risks    Follow up Falls prevention discussed;Falls evaluation completed Falls evaluation completed Falls prevention discussed  Falls evaluation completed    MEDICARE RISK AT HOME:  Medicare Risk at Home Any stairs in or around the home?: Yes If so, are there any without handrails?: No Home free of loose throw rugs in walkways, pet beds, electrical cords, etc?: Yes Adequate lighting in your home to reduce risk of falls?: Yes Life alert?: No Use of a cane, walker or w/c?: Yes (cane, walker) Grab bars in the bathroom?: Yes Shower chair or bench in shower?:  Yes Elevated toilet seat or a handicapped toilet?: Yes  TIMED UP AND GO:  Was the test performed?  No  Cognitive Function: 6CIT completed        10/14/2023    3:08 PM 12/05/2022   11:20 AM 12/03/2021   11:58 AM 12/01/2019    2:48 PM  6CIT Screen  What Year? 0 points 0 points 0 points 0 points  What month? 0 points 0 points 0 points 0 points  What time? 0 points 0 points 0 points 0 points  Count back from 20 0 points 0 points 0 points 0 points  Months in reverse 0 points 0 points 0 points 0 points  Repeat phrase 2 points 0 points 0 points 0 points  Total Score 2 points 0 points 0 points 0 points    Immunizations Immunization History  Administered Date(s) Administered   Fluad Quad(high Dose 65+) 06/02/2021   Influenza Split 05/12/2012  Influenza Whole 05/12/2009, 05/29/2010   Influenza, High Dose Seasonal PF 06/12/2013, 06/05/2016, 05/27/2018, 07/17/2020, 06/06/2022   Influenza-Unspecified 05/12/2014, 05/27/2015, 05/12/2017, 05/28/2018, 05/13/2023   PFIZER Comirnaty(Gray Top)Covid-19 Tri-Sucrose Vaccine 10/05/2019, 10/26/2019, 05/22/2020   Pneumococcal Conjugate-13 08/10/2013   Pneumococcal Polysaccharide-23 07/12/2011   Td 01/13/2004   Tdap 02/23/2014   Zoster Recombinant(Shingrix) 03/01/2019, 06/01/2019   Zoster, Live 01/27/2008, 05/12/2009    Screening Tests Health Maintenance  Topic Date Due   COVID-19 Vaccine (4 - 2024-25 season) 04/13/2023   DTaP/Tdap/Td (3 - Td or Tdap) 02/24/2024   Medicare Annual Wellness (AWV)  10/13/2024   Pneumonia Vaccine 51+ Years old  Completed   INFLUENZA VACCINE  Completed   DEXA SCAN  Completed   Zoster Vaccines- Shingrix  Completed   HPV VACCINES  Aged Out   Colonoscopy  Discontinued    Health Maintenance  Health Maintenance Due  Topic Date Due   COVID-19 Vaccine (4 - 2024-25 season) 04/13/2023   Health Maintenance Items Addressed:10/14/23   Additional Screening:  Vision Screening: Recommended annual ophthalmology exams  for early detection of glaucoma and other disorders of the eye. Pt sees Dr Randon Goldsmith for routine eye exams and also Dr Lottie Dawson w/[facility]   Dental Screening: Recommended annual dental exams for proper oral hygiene  Community Resource Referral / Chronic Care Management: CRR required this visit?  No   CCM required this visit?  No     Plan:     I have personally reviewed and noted the following in the patient's chart:   Medical and social history Use of alcohol, tobacco or illicit drugs  Current medications and supplements including opioid prescriptions. Patient is not currently taking opioid prescriptions. Functional ability and status Nutritional status Physical activity Advanced directives List of other physicians Hospitalizations, surgeries, and ER visits in previous 12 months Vitals Screenings to include cognitive, depression, and falls Referrals and appointments  In addition, I have reviewed and discussed with patient certain preventive protocols, quality metrics, and best practice recommendations. A written personalized care plan for preventive services as well as general preventive health recommendations were provided to patient.     Darreld Mclean, CMA   10/14/2023   After Visit Summary: (MyChart) Due to this being a telephonic visit, the after visit summary with patients personalized plan was offered to patient via MyChart   Notes: Nothing significant to report at this time.

## 2023-10-15 ENCOUNTER — Ambulatory Visit: Payer: Medicare Other | Admitting: Physical Therapy

## 2023-10-15 NOTE — Therapy (Deleted)
 OUTPATIENT PHYSICAL THERAPY TREATMENT NOTE   Patient Name: Linda [name] MRN: [id number] DOB:13-Sep-1940, 83 y.o., female Today's Date: 10/15/2023  END OF SESSION:      Past Medical History:  Diagnosis Date   ALLERGIC RHINITIS    Allergy    ANEMIA-IRON DEFICIENCY    Anxiety state, unspecified    Carpal tunnel syndrome    pt denies    Cataract    Closed left ankle fracture    COLONIC POLYPS, HX OF 2007   GERD    GLAUCOMA    Glaucoma    GOITER, MULTINODULAR    on [place] 2006, unchanged 6/13 with nodules all <62mm   HIATAL HERNIA WITH REFLUX    Hip dysplasia, acquired    B sx; eval at [facility] for same 01/2015 -ongoing PT   OSTEOARTHRITIS    Osteoporosis    Serrated polyp of colon    TOBACCO USE, QUIT    Past Surgical History:  Procedure Laterality Date   BREAST SURGERY  1960   Left breast cyst removed no complications   CARPAL TUNNEL RELEASE Right    COLONOSCOPY  2005   EYE SURGERY  1991-1992   both eyes   EYE SURGERY Left 2014   shunt behind left eye   ORIF FIBULA FRACTURE Left 08/24/2019   Procedure: OPEN REDUCTION INTERNAL FIXATION (ORIF) LEFT DISTAL FIBULA FRACTURE;  Surgeon: Teryl Lucy, MD;  Location:  SURGERY CENTER;  Service: Orthopedics;  Laterality: Left;   Right shoulder aurgery  10/2008   TOTAL HIP ARTHROPLASTY Right 08/25/2023   Procedure: RIGHT TOTAL HIP REPLACEMENT;  Surgeon: Tarry Kos, MD;  Location: [facility];  Service: Orthopedics;  Laterality: Right;  3-C   Patient Active Problem List   Diagnosis Date Noted   Status post total replacement of right hip 08/25/2023   Degenerative joint disease of right hip 08/24/2023   Spinal stenosis of lumbar region 10/09/2022   Facet arthritis of lumbar region 10/09/2022   Chronic bilateral low back pain without sciatica 10/04/2022   Osteopenia 11/26/2021   Pre-diabetes 11/26/2021   Other fatigue 07/28/2020   Dupuytren's disease of palm 01/19/2018   Viral URI 01/23/2017   Bilateral sensorineural  hearing loss 03/12/2016   Routine general medical examination at a health care facility 07/22/2015   Stress fracture of neck of right femur 01/25/2015   Anemia 11/06/2012   Anxiety 11/06/2012   Depression 11/06/2012   Cataract, nuclear 10/28/2012   Primary open angle glaucoma of both eyes, indeterminate stage 10/28/2012   Ptosis of eyelid 10/28/2012   Bladder prolapse, female, acquired 01/09/2012   GERD (gastroesophageal reflux disease) 05/21/2007    PCP: Myrlene Broker, MD  REFERRING PROVIDER: Cristie Hem, PA-C  REFERRING DIAG: [phone] (ICD-10-CM) - Status post total replacement of right hip  THERAPY DIAG:  Difficulty in walking, not elsewhere classified  Muscle weakness (generalized)  Pain in right hip  Cramp and spasm  Chronic bilateral low back pain without sciatica  Abnormal posture  Rationale for Evaluation and Treatment: Rehabilitation  ONSET DATE: S/P Right THA on 08/25/2023  SUBJECTIVE:   SUBJECTIVE STATEMENT:  PERTINENT HISTORY: Hx of back pain, R THA on 08/25/23, OA, Hx of Left ankle fracture in 2021, osteopenia  PAIN:  Are you having pain? Yes: NPRS scale: 3/10 Pain location: right hip Pain description: aching Aggravating factors: worse in the morning and with prolonged standing Relieving factors: movement  PRECAUTIONS: None  RED FLAGS: None   WEIGHT BEARING RESTRICTIONS: No  FALLS:  Has patient fallen in last 6 months? Yes. Number of falls 1 fall before surgery when she missed a step and fell at her daughter's home  LIVING ENVIRONMENT: Lives with: lives alone Lives in: House/apartment Stairs:  two story home Has following equipment at home: Single point cane, Environmental consultant - 2 wheeled, shower chair, and bed side commode  OCCUPATION: Retired  PLOF: Independent and Vocation/Vocational requirements: water aerobics, church activities  PATIENT GOALS: To be independent again and return to driving and community activities.  NEXT MD  VISIT: [facility]osis [facility] with [place] Persons, PA on 09/22/23  OBJECTIVE:  Note: Objective measures were completed at Evaluation unless otherwise noted.  DIAGNOSTIC FINDINGS:  Pelvic Radiograph on 08/25/2023: IMPRESSION: Right hip arthroplasty without immediate postoperative complication.  PATIENT SURVEYS:  Eval:  LEFS 30 / 80 = 37.5 %  COGNITION: Overall cognitive status: Within functional limits for tasks assessed     SENSATION: Patient states some numbness at incision site.  MUSCLE LENGTH: Hamstrings: Right tightness noted  POSTURE: rounded shoulders and forward head  PALPATION: Tender to palpation along incision site  LOWER EXTREMITY ROM:  WFL  LOWER EXTREMITY MMT:  Eval:   Right hip strength grossly 4 to 4+/5 Right quad and hamstring strength of 5-/5 Left LE strength is WFL   FUNCTIONAL TESTS:  Eval: 5 times sit to stand: 21.66 with required use of UE pushing chair handles Timed up and go (TUG): 20.66 sec with RW  09/26/23:  3 minute walk: covered 53' with gait belt and no AD  GAIT: Distance walked: >200 ft Assistive device utilized: Walker - 2 wheeled Level of assistance: Modified independence Comments: Pt with antalgic gait pattern                                                                                                                                TODAY'S TREATMENT   10/15/23: Pt arrives for aquatic physical therapy. Treatment took place in 3.5-5.5 feet of water. Water temperature was. Pt entered the pool via . Pt requires buoyancy of water for support and to offload joints with strengthening exercises.  Pt utilizes viscosity of the water required for strengthening. Seated water bench with 75% submersion Pt performed seated LE AROM exercises 20x in all planes,   DATE: 10/10/2023 Nustep level 5 x7 min with PT present to discuss status Seated hamstring stretch 2x20 sec bilat Seated ball rollouts for trunk flexion x10 Sit to/from stand from  mat table with 2 foam cushions x10 (no UE support) At counter with bil UE support: Rt hip abd and ext At barre: Step over hurdle Rt LE shift weight into Rt foot and return x5 At barre: Side step over hurdle Rt LE with weight shift into Rt foot and return x5 Lunge stance holding barre Rt foot back gentle rocking Rt hip flexor stretch x10 March to 90/90 at barre alt LE x20 with bil UE support Hooklying hip adduction ball squeeze 2x10  Hooklying clamshell with red loop 2x10 Side-lying manual therapy using roller bar to right thigh to promote decreased pain and decreased muscle tightness  DATE: 10/06/2023 Nustep level 5 x6 min with PT present to discuss status Seated hamstring stretch 2x20 sec bilat Sit to/from stand in chair with 2 foam cushions x10 (no UE support) Standing at counter:  hip abduction and hip extension (with small motion) x10 each bilat Hooklying hip adduction ball squeeze 2x10 Hooklying clamshell with red loop 2x10 Hooklying marching with TA activation.  2x10 Side-lying manual therapy using roller bar to right thigh to promote decreased pain and decreased muscle tightness   PATIENT EDUCATION:  Education details: Issued HEP Person educated: Patient Education method: Programmer, multimedia, Facilities manager, and Handouts Education comprehension: verbalized understanding and returned demonstration  HOME EXERCISE PROGRAM: Access Code: [email or web address] URL: [email or web address] Date: 09/26/2023 Prepared by: Morton Peters  Exercises - Standing March with Counter Support  - 1 x daily - 7 x weekly - 2 sets - 10 reps - Standing Hip Abduction with Counter Support  - 1 x daily - 7 x weekly - 2 sets - 10 reps - Standing Hip Extension with Counter Support  - 1 x daily - 7 x weekly - 2 sets - 10 reps - Mini Squat with Counter Support  - 1 x daily - 7 x weekly - 2 sets - 10 reps - Heel Raises with Counter Support  - 1 x daily - 7 x weekly - 2 sets - 10 reps - Side Stepping with  Counter Support  - 1 x daily - 7 x weekly - 3 sets - 10 reps - Supine Hip Adduction Isometric with Ball  - 1 x daily - 7 x weekly - 1 sets - 10 reps - 5 hold - Supine Bridge with Spinal Articulation  - 1 x daily - 7 x weekly - 1 sets - 10 reps - Hooklying Clamshell with Resistance  - 1 x daily - 7 x weekly - 1 sets - 10 reps  ASSESSMENT:  CLINICAL IMPRESSION:   OBJECTIVE IMPAIRMENTS: decreased balance, decreased mobility, difficulty walking, decreased strength, increased muscle spasms, impaired flexibility, and pain.   ACTIVITY LIMITATIONS: lifting, bending, standing, squatting, sleeping, and stairs  PARTICIPATION LIMITATIONS: cleaning, laundry, driving, community activity, and church  PERSONAL FACTORS: Past/current experiences and 3+ comorbidities: osteopenia, Hx of left ankle Fx, s/p R THA on 08/25/2023  are also affecting patient's functional outcome.   REHAB POTENTIAL: Good  CLINICAL DECISION MAKING: Evolving/moderate complexity  EVALUATION COMPLEXITY: Moderate   GOALS: Goals reviewed with patient? Yes  SHORT TERM GOALS: Target date: 10/10/2023 Patient will be independent with initial HEP. Baseline: Goal status: Met  2.  Patient will participate in 3 or 6 minute walk test to establish a baseline. Baseline:  Goal status: Met  LONG TERM GOALS: Target date: 11/14/2023  Patient will be independent with advanced HEP to allow for self progression after discharge. Baseline:  Goal status: Ongoing  2.  Patient will increase right hip strength to [facility] to allow her to navigate stairs in her home with reciprocal pattern. Baseline:  Goal status: INITIAL  3.  Patient will be able to walk for at least 15 minutes with LRAD to allow for return to community ambulation. Baseline:  Goal status: INITIAL  4.  Patient will improve Lower Extremity Functional Scale to at least 50% to demonstrate improved functional mobility. Baseline: 37.5% Goal status: INITIAL  5.  Patient will report  ability to resume aquatic aerobics class and going  on outings with her friends without increased pain. Baseline:  Goal status: INITIAL   PLAN:  PT FREQUENCY: 2x/week  PT DURATION: 8 weeks  PLANNED INTERVENTIONS: 97164- PT Re-evaluation, 97110-Therapeutic exercises, 97530- Therapeutic activity, 97112- Neuromuscular re-education, 97535- Self Care, 16109- Manual therapy, [phone]- Gait training, [phone]- Aquatic Therapy, 97014- Electrical stimulation (unattended), [phone]- Electrical stimulation (manual), [id number]- Vasopneumatic device, [id number]- Ultrasound, [id number]- Traction (mechanical), [id number]- Ionotophoresis 4mg /ml Dexamethasone, Patient/Family education, Balance training, Stair training, Taping, Dry Needling, Joint mobilization, Joint manipulation, Spinal manipulation, Spinal mobilization, Scar mobilization, Cryotherapy, and Moist heat  PLAN FOR NEXT SESSION: NuStep, walking endurance and confidence, Assess and progress HEP as indicated, strengthening, flexibility, manual/dry needling as indicated   Ane Payment, PTA 10/15/23 8:12 AM   [facility] [address], [address] [place], [place] [address] Phone # [phone] Fax [phone]

## 2023-10-17 ENCOUNTER — Encounter: Payer: Self-pay | Admitting: Rehabilitative and Restorative Service Providers"

## 2023-10-17 ENCOUNTER — Ambulatory Visit: Payer: Medicare Other | Attending: Physician Assistant | Admitting: Rehabilitative and Restorative Service Providers"

## 2023-10-17 DIAGNOSIS — R262 Difficulty in walking, not elsewhere classified: Secondary | ICD-10-CM | POA: Diagnosis present

## 2023-10-17 DIAGNOSIS — M6281 Muscle weakness (generalized): Secondary | ICD-10-CM | POA: Insufficient documentation

## 2023-10-17 DIAGNOSIS — R252 Cramp and spasm: Secondary | ICD-10-CM | POA: Insufficient documentation

## 2023-10-17 DIAGNOSIS — R293 Abnormal posture: Secondary | ICD-10-CM | POA: Diagnosis present

## 2023-10-17 DIAGNOSIS — M25551 Pain in right hip: Secondary | ICD-10-CM | POA: Insufficient documentation

## 2023-10-17 DIAGNOSIS — G8929 Other chronic pain: Secondary | ICD-10-CM | POA: Insufficient documentation

## 2023-10-17 DIAGNOSIS — M545 Low back pain, unspecified: Secondary | ICD-10-CM | POA: Diagnosis present

## 2023-10-17 NOTE — Therapy (Signed)
 OUTPATIENT PHYSICAL THERAPY TREATMENT NOTE   Patient Name: Linda [name] MRN: [id number] DOB:07-15-1941, 83 y.o., female Today's Date: 10/17/2023  END OF SESSION:  PT End of Session - 10/17/23 1020     Visit Number 6    Date for PT Re-Evaluation 11/14/23    Authorization Type BC/BS Medicare    Progress Note Due on Visit 10    PT Start Time 1015    PT Stop Time 1055    PT Time Calculation (min) 40 min    Activity Tolerance Patient tolerated treatment well    Behavior During Therapy WFL for tasks assessed/performed               Past Medical History:  Diagnosis Date   ALLERGIC RHINITIS    Allergy    ANEMIA-IRON DEFICIENCY    Anxiety state, unspecified    Carpal tunnel syndrome    pt denies    Cataract    Closed left ankle fracture    COLONIC POLYPS, HX OF 2007   GERD    GLAUCOMA    Glaucoma    GOITER, MULTINODULAR    on [place] 2006, unchanged 6/13 with nodules all <61mm   HIATAL HERNIA WITH REFLUX    Hip dysplasia, acquired    B sx; eval at [facility] for same 01/2015 -ongoing PT   OSTEOARTHRITIS    Osteoporosis    Serrated polyp of colon    TOBACCO USE, QUIT    Past Surgical History:  Procedure Laterality Date   BREAST SURGERY  1960   Left breast cyst removed no complications   CARPAL TUNNEL RELEASE Right    COLONOSCOPY  2005   EYE SURGERY  1991-1992   both eyes   EYE SURGERY Left 2014   shunt behind left eye   ORIF FIBULA FRACTURE Left 08/24/2019   Procedure: OPEN REDUCTION INTERNAL FIXATION (ORIF) LEFT DISTAL FIBULA FRACTURE;  Surgeon: Teryl Lucy, MD;  Location: [facility];  Service: Orthopedics;  Laterality: Left;   Right shoulder aurgery  10/2008   TOTAL HIP ARTHROPLASTY Right 08/25/2023   Procedure: RIGHT TOTAL HIP REPLACEMENT;  Surgeon: Tarry Kos, MD;  Location: [facility] OR;  Service: Orthopedics;  Laterality: Right;  3-C   Patient Active Problem List   Diagnosis Date Noted   Status post total replacement of right hip 08/25/2023    Degenerative joint disease of right hip 08/24/2023   Spinal stenosis of lumbar region 10/09/2022   Facet arthritis of lumbar region 10/09/2022   Chronic bilateral low back pain without sciatica 10/04/2022   Osteopenia 11/26/2021   Pre-diabetes 11/26/2021   Other fatigue 07/28/2020   Dupuytren's disease of palm 01/19/2018   Viral URI 01/23/2017   Bilateral sensorineural hearing loss 03/12/2016   Routine general medical examination at a health care facility 07/22/2015   Stress fracture of neck of right femur 01/25/2015   Anemia 11/06/2012   Anxiety 11/06/2012   Depression 11/06/2012   Cataract, nuclear 10/28/2012   Primary open angle glaucoma of both eyes, indeterminate stage 10/28/2012   Ptosis of eyelid 10/28/2012   Bladder prolapse, female, acquired 01/09/2012   GERD (gastroesophageal reflux disease) 05/21/2007    PCP: Myrlene Broker, MD  REFERRING PROVIDER: Cristie Hem, PA-C  REFERRING DIAG: [phone] (ICD-10-CM) - Status post total replacement of right hip  THERAPY DIAG:  Difficulty in walking, not elsewhere classified  Muscle weakness (generalized)  Pain in right hip  Cramp and spasm  Rationale for Evaluation and Treatment: Rehabilitation  ONSET DATE: S/P Right THA on 08/25/2023  SUBJECTIVE:   SUBJECTIVE STATEMENT: Patient states that she is still having pain in her hip region.  Patient states that she missed aquatic PT secondary to the inclement weather.  PERTINENT HISTORY: Hx of back pain, R THA on 08/25/23, OA, Hx of Left ankle fracture in 2021, osteopenia  PAIN:  Are you having pain? Yes: NPRS scale: 1-5/10 Pain location: right hip Pain description: aching Aggravating factors: worse in the morning and with prolonged standing Relieving factors: movement  PRECAUTIONS: None  RED FLAGS: None   WEIGHT BEARING RESTRICTIONS: No  FALLS:  Has patient fallen in last 6 months? Yes. Number of falls 1 fall before surgery when she missed a step and  fell at her daughter's home  LIVING ENVIRONMENT: Lives with: lives alone Lives in: House/apartment Stairs:  two story home Has following equipment at home: Single point cane, Environmental consultant - 2 wheeled, shower chair, and bed side commode  OCCUPATION: Retired  PLOF: Independent and Vocation/Vocational requirements: water aerobics, church activities  PATIENT GOALS: To be independent again and return to driving and community activities.  NEXT MD VISIT: Osteoporosis Clinic with [place] Persons, PA on 09/22/23  OBJECTIVE:  Note: Objective measures were completed at Evaluation unless otherwise noted.  DIAGNOSTIC FINDINGS:  Pelvic Radiograph on 08/25/2023: IMPRESSION: Right hip arthroplasty without immediate postoperative complication.  PATIENT SURVEYS:  Eval:  LEFS 30 / 80 = 37.5 %  COGNITION: Overall cognitive status: Within functional limits for tasks assessed     SENSATION: Patient states some numbness at incision site.  MUSCLE LENGTH: Hamstrings: Right tightness noted  POSTURE: rounded shoulders and forward head  PALPATION: Tender to palpation along incision site  LOWER EXTREMITY ROM:  WFL  LOWER EXTREMITY MMT:  Eval:   Right hip strength grossly 4 to 4+/5 Right quad and hamstring strength of 5-/5 Left LE strength is WFL   FUNCTIONAL TESTS:  Eval: 5 times sit to stand: 21.66 with required use of UE pushing chair handles Timed up and go (TUG): 20.66 sec with RW  09/26/23:  3 minute walk: covered 67' with gait belt and no AD  10/17/2023: 3 minute walk:  464 ft with SPC  GAIT: Distance walked: >200 ft Assistive device utilized: Environmental consultant - 2 wheeled Level of assistance: Modified independence Comments: Pt with antalgic gait pattern                                                                                                                                TODAY'S TREATMENT   DATE: 10/17/2023 Nustep level 5 x5 min with PT present to discuss status 3 minute walk:   464 ft with SPC Seated hamstring stretch 2x20 sec bilat Sit to/from stand on PT mat with 1 foam cushion x10 (no UE support) Standing at barre:  hip abduction, hip extension, high marching.  2X10 each bilat At barre: Step over hurdle Rt LE shift weight into Rt  foot and return 2x5 At barre standing on 2" step:  heel tap down x10 bilat At barre:  standing rocker board DF/PF x2 min   DATE: 10/10/2023 Nustep level 5 x7 min with PT present to discuss status Seated hamstring stretch 2x20 sec bilat Seated ball rollouts for trunk flexion x10 Sit to/from stand from mat table with 2 foam cushions x10 (no UE support) At counter with bil UE support: Rt hip abd and ext At barre: Step over hurdle Rt LE shift weight into Rt foot and return x5 At barre: Side step over hurdle Rt LE with weight shift into Rt foot and return x5 Lunge stance holding barre Rt foot back gentle rocking Rt hip flexor stretch x10 March to 90/90 at barre alt LE x20 with bil UE support Hooklying hip adduction ball squeeze 2x10 Hooklying clamshell with red loop 2x10 Side-lying manual therapy using roller bar to right thigh to promote decreased pain and decreased muscle tightness  DATE: 10/06/2023 Nustep level 5 x6 min with PT present to discuss status Seated hamstring stretch 2x20 sec bilat Sit to/from stand in chair with 2 foam cushions x10 (no UE support) Standing at counter:  hip abduction and hip extension (with small motion) x10 each bilat Hooklying hip adduction ball squeeze 2x10 Hooklying clamshell with red loop 2x10 Hooklying marching with TA activation.  2x10 Side-lying manual therapy using roller bar to right thigh to promote decreased pain and decreased muscle tightness   PATIENT EDUCATION:  Education details: Issued HEP Person educated: Patient Education method: Programmer, multimedia, Facilities manager, and Handouts Education comprehension: verbalized understanding and returned demonstration  HOME EXERCISE PROGRAM: Access  Code: [email or web address] URL: [email or web address] Date: 09/26/2023 Prepared by: Morton Peters  Exercises - Standing March with Counter Support  - 1 x daily - 7 x weekly - 2 sets - 10 reps - Standing Hip Abduction with Counter Support  - 1 x daily - 7 x weekly - 2 sets - 10 reps - Standing Hip Extension with Counter Support  - 1 x daily - 7 x weekly - 2 sets - 10 reps - Mini Squat with Counter Support  - 1 x daily - 7 x weekly - 2 sets - 10 reps - Heel Raises with Counter Support  - 1 x daily - 7 x weekly - 2 sets - 10 reps - Side Stepping with Counter Support  - 1 x daily - 7 x weekly - 3 sets - 10 reps - Supine Hip Adduction Isometric with Ball  - 1 x daily - 7 x weekly - 1 sets - 10 reps - 5 hold - Supine Bridge with Spinal Articulation  - 1 x daily - 7 x weekly - 1 sets - 10 reps - Hooklying Clamshell with Resistance  - 1 x daily - 7 x weekly - 1 sets - 10 reps  ASSESSMENT:  CLINICAL IMPRESSION: Ms Bigford presents to skilled PT and she continues to report that she has the most pain with the sit to/from stand transfer.  Patient able to continues to progress with standing exercises and continues to tolerate with improved ease and decreased pain.  On most exercises, patient reports that the pain decreases as she continues the exercise.  Patient educated about the healing process and that it can take 6 months to a year for the swelling from surgery to abate fully.  Patient is excited to start aquatic PT sessions.  Patient continues to require skilled PT to progress towards goal related activities.    OBJECTIVE  IMPAIRMENTS: decreased balance, decreased mobility, difficulty walking, decreased strength, increased muscle spasms, impaired flexibility, and pain.   ACTIVITY LIMITATIONS: lifting, bending, standing, squatting, sleeping, and stairs  PARTICIPATION LIMITATIONS: cleaning, laundry, driving, community activity, and church  PERSONAL FACTORS: Past/current experiences and 3+  comorbidities: osteopenia, Hx of left ankle Fx, s/p R THA on 08/25/2023  are also affecting patient's functional outcome.   REHAB POTENTIAL: Good  CLINICAL DECISION MAKING: Evolving/moderate complexity  EVALUATION COMPLEXITY: Moderate   GOALS: Goals reviewed with patient? Yes  SHORT TERM GOALS: Target date: 10/10/2023 Patient will be independent with initial HEP. Baseline: Goal status: Met  2.  Patient will participate in 3 or 6 minute walk test to establish a baseline. Baseline:  Goal status: Met  LONG TERM GOALS: Target date: 11/14/2023  Patient will be independent with advanced HEP to allow for self progression after discharge. Baseline:  Goal status: Ongoing  2.  Patient will increase right hip strength to [facility] to allow her to navigate stairs in her home with reciprocal pattern. Baseline:  Goal status: Ongoing  3.  Patient will be able to walk for at least 15 minutes with LRAD to allow for return to community ambulation. Baseline:  Goal status: Ongoing  4.  Patient will improve Lower Extremity Functional Scale to at least 50% to demonstrate improved functional mobility. Baseline: 37.5% Goal status: INITIAL  5.  Patient will report ability to resume aquatic aerobics class and going on outings with her friends without increased pain. Baseline:  Goal status: INITIAL   PLAN:  PT FREQUENCY: 2x/week  PT DURATION: 8 weeks  PLANNED INTERVENTIONS: 97164- PT Re-evaluation, 97110-Therapeutic exercises, 97530- Therapeutic activity, 97112- Neuromuscular re-education, 97535- Self Care, 82956- Manual therapy, [phone]- Gait training, [phone]- Aquatic Therapy, 97014- Electrical stimulation (unattended), [phone]- Electrical stimulation (manual), [id number]- Vasopneumatic device, [id number]- Ultrasound, [id number]- Traction (mechanical), [id number]- Ionotophoresis 4mg /ml Dexamethasone, Patient/Family education, Balance training, Stair training, Taping, Dry Needling, Joint mobilization, Joint manipulation, Spinal  manipulation, Spinal mobilization, Scar mobilization, Cryotherapy, and Moist heat  PLAN FOR NEXT SESSION: NuStep, walking endurance and confidence, Assess and progress HEP as indicated, strengthening, flexibility, manual/dry needling as indicated, aquatic PT    Reather Laurence, PT, DPT 10/17/23, 11:05 AM  [facility] [address] [place], [place] [address] Phone # [phone] Fax [phone]

## 2023-10-22 ENCOUNTER — Encounter: Payer: Self-pay | Admitting: Physical Therapy

## 2023-10-22 ENCOUNTER — Ambulatory Visit: Payer: Medicare Other | Admitting: Physical Therapy

## 2023-10-22 DIAGNOSIS — R262 Difficulty in walking, not elsewhere classified: Secondary | ICD-10-CM | POA: Diagnosis not present

## 2023-10-22 DIAGNOSIS — R252 Cramp and spasm: Secondary | ICD-10-CM

## 2023-10-22 DIAGNOSIS — M6281 Muscle weakness (generalized): Secondary | ICD-10-CM

## 2023-10-22 DIAGNOSIS — M25551 Pain in right hip: Secondary | ICD-10-CM

## 2023-10-22 DIAGNOSIS — G8929 Other chronic pain: Secondary | ICD-10-CM

## 2023-10-22 NOTE — Therapy (Signed)
 OUTPATIENT PHYSICAL THERAPY TREATMENT NOTE   Patient Name: Linda [name] MRN: [id number] DOB:08-16-1940, 83 y.o., female Today's Date: 10/22/2023  END OF SESSION:  PT End of Session - 10/22/23 1148     Visit Number 7    Date for PT Re-Evaluation 11/14/23    Authorization Type BC/BS Medicare    Progress Note Due on Visit 10    PT Start Time 1103    PT Stop Time 1148    PT Time Calculation (min) 45 min    Activity Tolerance Patient tolerated treatment well    Behavior During Therapy WFL for tasks assessed/performed               Past Medical History:  Diagnosis Date   ALLERGIC RHINITIS    Allergy    ANEMIA-IRON DEFICIENCY    Anxiety state, unspecified    Carpal tunnel syndrome    pt denies    Cataract    Closed left ankle fracture    COLONIC POLYPS, HX OF 2007   GERD    GLAUCOMA    Glaucoma    GOITER, MULTINODULAR    on [place] 2006, unchanged 6/13 with nodules all <43mm   HIATAL HERNIA WITH REFLUX    Hip dysplasia, acquired    B sx; eval at [facility] for same 01/2015 -ongoing PT   OSTEOARTHRITIS    Osteoporosis    Serrated polyp of colon    TOBACCO USE, QUIT    Past Surgical History:  Procedure Laterality Date   BREAST SURGERY  1960   Left breast cyst removed no complications   CARPAL TUNNEL RELEASE Right    COLONOSCOPY  2005   EYE SURGERY  1991-1992   both eyes   EYE SURGERY Left 2014   shunt behind left eye   ORIF FIBULA FRACTURE Left 08/24/2019   Procedure: OPEN REDUCTION INTERNAL FIXATION (ORIF) LEFT DISTAL FIBULA FRACTURE;  Surgeon: Teryl Lucy, MD;  Location: [facility];  Service: Orthopedics;  Laterality: Left;   Right shoulder aurgery  10/2008   TOTAL HIP ARTHROPLASTY Right 08/25/2023   Procedure: RIGHT TOTAL HIP REPLACEMENT;  Surgeon: Tarry Kos, MD;  Location: [facility] OR;  Service: Orthopedics;  Laterality: Right;  3-C   Patient Active Problem List   Diagnosis Date Noted   Status post total replacement of right hip 08/25/2023    Degenerative joint disease of right hip 08/24/2023   Spinal stenosis of lumbar region 10/09/2022   Facet arthritis of lumbar region 10/09/2022   Chronic bilateral low back pain without sciatica 10/04/2022   Osteopenia 11/26/2021   Pre-diabetes 11/26/2021   Other fatigue 07/28/2020   Dupuytren's disease of palm 01/19/2018   Viral URI 01/23/2017   Bilateral sensorineural hearing loss 03/12/2016   Routine general medical examination at a health care facility 07/22/2015   Stress fracture of neck of right femur 01/25/2015   Anemia 11/06/2012   Anxiety 11/06/2012   Depression 11/06/2012   Cataract, nuclear 10/28/2012   Primary open angle glaucoma of both eyes, indeterminate stage 10/28/2012   Ptosis of eyelid 10/28/2012   Bladder prolapse, female, acquired 01/09/2012   GERD (gastroesophageal reflux disease) 05/21/2007    PCP: Myrlene Broker, MD  REFERRING PROVIDER: Cristie Hem, PA-C  REFERRING DIAG: [phone] (ICD-10-CM) - Status post total replacement of right hip  THERAPY DIAG:  Difficulty in walking, not elsewhere classified  Muscle weakness (generalized)  Pain in right hip  Cramp and spasm  Chronic bilateral low back pain without sciatica  Rationale for Evaluation and Treatment: Rehabilitation  ONSET DATE: S/P Right THA on 08/25/2023  SUBJECTIVE:   SUBJECTIVE STATEMENT: Pain typically upon standing from my chair at home. Walking seems to make it feel better. Right now I am ok.   PERTINENT HISTORY: Hx of back pain, R THA on 08/25/23, OA, Hx of Left ankle fracture in 2021, osteopenia  PAIN:  Are you having pain? Not right this minute  PRECAUTIONS: None  RED FLAGS: None   WEIGHT BEARING RESTRICTIONS: No  FALLS:  Has patient fallen in last 6 months? Yes. Number of falls 1 fall before surgery when she missed a step and fell at her daughter's home  LIVING ENVIRONMENT: Lives with: lives alone Lives in: House/apartment Stairs:  two story home Has  following equipment at home: Single point cane, Environmental consultant - 2 wheeled, shower chair, and bed side commode  OCCUPATION: Retired  PLOF: Independent and Vocation/Vocational requirements: water aerobics, church activities  PATIENT GOALS: To be independent again and return to driving and community activities.  NEXT MD VISIT: Osteoporosis Clinic with [place] Persons, PA on 09/22/23  OBJECTIVE:  Note: Objective measures were completed at Evaluation unless otherwise noted.  DIAGNOSTIC FINDINGS:  Pelvic Radiograph on 08/25/2023: IMPRESSION: Right hip arthroplasty without immediate postoperative complication.  PATIENT SURVEYS:  Eval:  LEFS 30 / 80 = 37.5 %  COGNITION: Overall cognitive status: Within functional limits for tasks assessed     SENSATION: Patient states some numbness at incision site.  MUSCLE LENGTH: Hamstrings: Right tightness noted  POSTURE: rounded shoulders and forward head  PALPATION: Tender to palpation along incision site  LOWER EXTREMITY ROM:  WFL  LOWER EXTREMITY MMT:  Eval:   Right hip strength grossly 4 to 4+/5 Right quad and hamstring strength of 5-/5 Left LE strength is WFL   FUNCTIONAL TESTS:  Eval: 5 times sit to stand: 21.66 with required use of UE pushing chair handles Timed up and go (TUG): 20.66 sec with RW  09/26/23:  3 minute walk: covered 88' with gait belt and no AD  10/17/2023: 3 minute walk:  464 ft with SPC  GAIT: Distance walked: >200 ft Assistive device utilized: Environmental consultant - 2 wheeled Level of assistance: Modified independence Comments: Pt with antalgic gait pattern                                                                                                                                TODAY'S TREATMENT   10/22/23: Pt arrives for aquatic physical therapy. Treatment took place in 3.5-5.5 feet of water. Water temperature was 91 degrees F Pt entered the pool via stairs reciprocally but slow. Seated water bench with 75%  submersion Pt performed seated LE AROM exercises 20x in all planes, concurrent discussion of her current status.Pt requires buoyancy of water for support and to offload joints with strengthening exercises.   75% depth water walking with UE push/pull with hand floats. 6x each direction. Standing marching  holding the small noodle 2x6. Bil hip kicks 3 ways 6x each holding on for balance. Soft tissue work to Rt anterior hip. Heel lifts 15x. Underwater bicycle on horseback 6 min.  DATE: 10/17/2023 Nustep level 5 x5 min with PT present to discuss status 3 minute walk:  464 ft with SPC Seated hamstring stretch 2x20 sec bilat Sit to/from stand on PT mat with 1 foam cushion x10 (no UE support) Standing at barre:  hip abduction, hip extension, high marching.  2X10 each bilat At barre: Step over hurdle Rt LE shift weight into Rt foot and return 2x5 At barre standing on 2" step:  heel tap down x10 bilat At barre:  standing rocker board DF/PF x2 min   DATE: 10/10/2023 Nustep level 5 x7 min with PT present to discuss status Seated hamstring stretch 2x20 sec bilat Seated ball rollouts for trunk flexion x10 Sit to/from stand from mat table with 2 foam cushions x10 (no UE support) At counter with bil UE support: Rt hip abd and ext At barre: Step over hurdle Rt LE shift weight into Rt foot and return x5 At barre: Side step over hurdle Rt LE with weight shift into Rt foot and return x5 Lunge stance holding barre Rt foot back gentle rocking Rt hip flexor stretch x10 March to 90/90 at barre alt LE x20 with bil UE support Hooklying hip adduction ball squeeze 2x10 Hooklying clamshell with red loop 2x10 Side-lying manual therapy using roller bar to right thigh to promote decreased pain and decreased muscle tightness  PATIENT EDUCATION:  Education details: Issued HEP Person educated: Patient Education method: Programmer, multimedia, Facilities manager, and Handouts Education comprehension: verbalized understanding and returned  demonstration  HOME EXERCISE PROGRAM: Access Code: [email or web address] URL: [email or web address] Date: 09/26/2023 Prepared by: Morton Peters  Exercises - Standing March with Counter Support  - 1 x daily - 7 x weekly - 2 sets - 10 reps - Standing Hip Abduction with Counter Support  - 1 x daily - 7 x weekly - 2 sets - 10 reps - Standing Hip Extension with Counter Support  - 1 x daily - 7 x weekly - 2 sets - 10 reps - Mini Squat with Counter Support  - 1 x daily - 7 x weekly - 2 sets - 10 reps - Heel Raises with Counter Support  - 1 x daily - 7 x weekly - 2 sets - 10 reps - Side Stepping with Counter Support  - 1 x daily - 7 x weekly - 3 sets - 10 reps - Supine Hip Adduction Isometric with Ball  - 1 x daily - 7 x weekly - 1 sets - 10 reps - 5 hold - Supine Bridge with Spinal Articulation  - 1 x daily - 7 x weekly - 1 sets - 10 reps - Hooklying Clamshell with Resistance  - 1 x daily - 7 x weekly - 1 sets - 10 reps  ASSESSMENT:  CLINICAL IMPRESSION:Pt's arrives for her first aquatic PT session. She has done water aerobics in the past and really enjoys water exercise. Her main complaint continues to be hip pain upon standing from a lower chair. She had a little pain initially with any hip flexion based exercise but would abolish pretty quickly.    OBJECTIVE IMPAIRMENTS: decreased balance, decreased mobility, difficulty walking, decreased strength, increased muscle spasms, impaired flexibility, and pain.   ACTIVITY LIMITATIONS: lifting, bending, standing, squatting, sleeping, and stairs  PARTICIPATION LIMITATIONS: cleaning, laundry, driving, community activity, and church  PERSONAL FACTORS:  Past/current experiences and 3+ comorbidities: osteopenia, Hx of left ankle Fx, s/p R THA on 08/25/2023  are also affecting patient's functional outcome.   REHAB POTENTIAL: Good  CLINICAL DECISION MAKING: Evolving/moderate complexity  EVALUATION COMPLEXITY: Moderate   GOALS: Goals reviewed  with patient? Yes  SHORT TERM GOALS: Target date: 10/10/2023 Patient will be independent with initial HEP. Baseline: Goal status: Met  2.  Patient will participate in 3 or 6 minute walk test to establish a baseline. Baseline:  Goal status: Met  LONG TERM GOALS: Target date: 11/14/2023  Patient will be independent with advanced HEP to allow for self progression after discharge. Baseline:  Goal status: Ongoing  2.  Patient will increase right hip strength to [facility] to allow her to navigate stairs in her home with reciprocal pattern. Baseline:  Goal status: Ongoing  3.  Patient will be able to walk for at least 15 minutes with LRAD to allow for return to community ambulation. Baseline:  Goal status: Ongoing  4.  Patient will improve Lower Extremity Functional Scale to at least 50% to demonstrate improved functional mobility. Baseline: 37.5% Goal status: INITIAL  5.  Patient will report ability to resume aquatic aerobics class and going on outings with her friends without increased pain. Baseline:  Goal status: INITIAL   PLAN:  PT FREQUENCY: 2x/week  PT DURATION: 8 weeks  PLANNED INTERVENTIONS: 97164- PT Re-evaluation, 97110-Therapeutic exercises, 97530- Therapeutic activity, 97112- Neuromuscular re-education, 97535- Self Care, 10272- Manual therapy, [phone]- Gait training, [phone]- Aquatic Therapy, 97014- Electrical stimulation (unattended), [phone]- Electrical stimulation (manual), [id number]- Vasopneumatic device, [id number]- Ultrasound, [id number]- Traction (mechanical), [id number]- Ionotophoresis 4mg /ml Dexamethasone, Patient/Family education, Balance training, Stair training, Taping, Dry Needling, Joint mobilization, Joint manipulation, Spinal manipulation, Spinal mobilization, Scar mobilization, Cryotherapy, and Moist heat  PLAN FOR NEXT SESSION: NuStep, walking endurance and confidence, Assess and progress HEP as indicated, strengthening, flexibility, manual/dry needling as indicated, aquatic  PT  Ane Payment, PTA 10/22/23 11:50 AM      [facility] [address] [place], [place] [address] Phone # [phone] Fax [phone]

## 2023-10-24 ENCOUNTER — Ambulatory Visit: Payer: Medicare Other | Admitting: Physical Therapy

## 2023-10-24 ENCOUNTER — Encounter: Payer: Self-pay | Admitting: Physical Therapy

## 2023-10-24 DIAGNOSIS — M6281 Muscle weakness (generalized): Secondary | ICD-10-CM

## 2023-10-24 DIAGNOSIS — R262 Difficulty in walking, not elsewhere classified: Secondary | ICD-10-CM | POA: Diagnosis not present

## 2023-10-24 DIAGNOSIS — M25551 Pain in right hip: Secondary | ICD-10-CM

## 2023-10-24 DIAGNOSIS — R252 Cramp and spasm: Secondary | ICD-10-CM

## 2023-10-24 NOTE — Therapy (Signed)
 OUTPATIENT PHYSICAL THERAPY TREATMENT NOTE   Patient Name: Linda [name] MRN: [id number] DOB:[birth year], 83 y.o., female Today's Date: 10/24/2023  END OF SESSION:  PT End of Session - 10/24/23 1106     Visit Number 8    Date for PT Re-Evaluation 11/14/23    Authorization Type BC/BS Medicare    Progress Note Due on Visit 10    PT Start Time 1103    PT Stop Time 1150    PT Time Calculation (min) 47 min    Activity Tolerance Patient tolerated treatment well    Behavior During Therapy WFL for tasks assessed/performed                Past Medical History:  Diagnosis Date   ALLERGIC RHINITIS    Allergy    ANEMIA-IRON DEFICIENCY    Anxiety state, unspecified    Carpal tunnel syndrome    pt denies    Cataract    Closed left ankle fracture    COLONIC POLYPS, HX OF 2007   GERD    GLAUCOMA    Glaucoma    GOITER, MULTINODULAR    on [place] 2006, unchanged 6/13 with nodules all <89mm   HIATAL HERNIA WITH REFLUX    Hip dysplasia, acquired    B sx; eval at [facility] for same 01/2015 -ongoing PT   OSTEOARTHRITIS    Osteoporosis    Serrated polyp of colon    TOBACCO USE, QUIT    Past Surgical History:  Procedure Laterality Date   BREAST SURGERY  1960   Left breast cyst removed no complications   CARPAL TUNNEL RELEASE Right    COLONOSCOPY  2005   EYE SURGERY  1991-1992   both eyes   EYE SURGERY Left 2014   shunt behind left eye   ORIF FIBULA FRACTURE Left 08/24/2019   Procedure: OPEN REDUCTION INTERNAL FIXATION (ORIF) LEFT DISTAL FIBULA FRACTURE;  Surgeon: Teryl Lucy, MD;  Location: [facility];  Service: Orthopedics;  Laterality: Left;   Right shoulder aurgery  10/2008   TOTAL HIP ARTHROPLASTY Right 08/25/2023   Procedure: RIGHT TOTAL HIP REPLACEMENT;  Surgeon: Tarry Kos, MD;  Location: [facility] OR;  Service: Orthopedics;  Laterality: Right;  3-C   Patient Active Problem List   Diagnosis Date Noted   Status post total replacement of right hip 08/25/2023    Degenerative joint disease of right hip 08/24/2023   Spinal stenosis of lumbar region 10/09/2022   Facet arthritis of lumbar region 10/09/2022   Chronic bilateral low back pain without sciatica 10/04/2022   Osteopenia 11/26/2021   Pre-diabetes 11/26/2021   Other fatigue 07/28/2020   Dupuytren's disease of palm 01/19/2018   Viral URI 01/23/2017   Bilateral sensorineural hearing loss 03/12/2016   Routine general medical examination at a health care facility 07/22/2015   Stress fracture of neck of right femur 01/25/2015   Anemia 11/06/2012   Anxiety 11/06/2012   Depression 11/06/2012   Cataract, nuclear 10/28/2012   Primary open angle glaucoma of both eyes, indeterminate stage 10/28/2012   Ptosis of eyelid 10/28/2012   Bladder prolapse, female, acquired 01/09/2012   GERD (gastroesophageal reflux disease) 05/21/2007    PCP: Myrlene Broker, MD  REFERRING PROVIDER: Cristie Hem, PA-C  REFERRING DIAG: [phone] (ICD-10-CM) - Status post total replacement of right hip  THERAPY DIAG:  Difficulty in walking, not elsewhere classified  Muscle weakness (generalized)  Pain in right hip  Cramp and spasm  Rationale for Evaluation and Treatment: Rehabilitation  ONSET DATE: S/P Right THA on 08/25/2023  SUBJECTIVE:   SUBJECTIVE STATEMENT: the pool was great and Victorino Dike had some great suggestions for working on my hip with good movements.  PERTINENT HISTORY: Hx of back pain, R THA on 08/25/23, OA, Hx of Left ankle fracture in 2021, osteopenia  PAIN:  Are you having pain? Not right this minute  PRECAUTIONS: None  RED FLAGS: None   WEIGHT BEARING RESTRICTIONS: No  FALLS:  Has patient fallen in last 6 months? Yes. Number of falls 1 fall before surgery when she missed a step and fell at her daughter's home  LIVING ENVIRONMENT: Lives with: lives alone Lives in: House/apartment Stairs:  two story home Has following equipment at home: Single point cane, Environmental consultant - 2  wheeled, shower chair, and bed side commode  OCCUPATION: Retired  PLOF: Independent and Vocation/Vocational requirements: water aerobics, church activities  PATIENT GOALS: To be independent again and return to driving and community activities.  NEXT MD VISIT: Osteoporosis Clinic with [place] Persons, PA on 09/22/23  OBJECTIVE:  Note: Objective measures were completed at Evaluation unless otherwise noted.  DIAGNOSTIC FINDINGS:  Pelvic Radiograph on 08/25/2023: IMPRESSION: Right hip arthroplasty without immediate postoperative complication.  PATIENT SURVEYS:  Eval:  LEFS 30 / 80 = 37.5 %  COGNITION: Overall cognitive status: Within functional limits for tasks assessed     SENSATION: Patient states some numbness at incision site.  MUSCLE LENGTH: Hamstrings: Right tightness noted  POSTURE: rounded shoulders and forward head  PALPATION: Tender to palpation along incision site  LOWER EXTREMITY ROM:  WFL  LOWER EXTREMITY MMT:  Eval:   Right hip strength grossly 4 to 4+/5 Right quad and hamstring strength of 5-/5 Left LE strength is WFL   FUNCTIONAL TESTS:  Eval: 5 times sit to stand: 21.66 with required use of UE pushing chair handles Timed up and go (TUG): 20.66 sec with RW  09/26/23:  3 minute walk: covered 59' with gait belt and no AD  10/17/2023: 3 minute walk:  464 ft with SPC  GAIT: Distance walked: >200 ft Assistive device utilized: Environmental consultant - 2 wheeled Level of assistance: Modified independence Comments: Pt with antalgic gait pattern                                                                                                                                TODAY'S TREATMENT  10/24/23: NuStep L5 x 5' PT present to discuss status Standing heel raises bil x20 3-way hip with single UE support at barre x5 cycles Standing alt march x20 bil UE support Standing L stretch at barre for lumbar spine  Leg press 50lb 2x25 - first set 50/50 effort between  legs, 2nd set 75/25 effort Rt/Lt At barre: Step over hurdle fwd and sideways and return 2x5 bil Manual therapy: supine STM to anterior Rt hip to reduce swelling and improve soft tissue extensibility   10/22/23: Pt arrives for aquatic physical therapy.  Treatment took place in 3.5-5.5 feet of water. Water temperature was 91 degrees F Pt entered the pool via stairs reciprocally but slow. Seated water bench with 75% submersion Pt performed seated LE AROM exercises 20x in all planes, concurrent discussion of her current status.Pt requires buoyancy of water for support and to offload joints with strengthening exercises.   75% depth water walking with UE push/pull with hand floats. 6x each direction. Standing marching holding the small noodle 2x6. Bil hip kicks 3 ways 6x each holding on for balance. Soft tissue work to Rt anterior hip. Heel lifts 15x. Underwater bicycle on horseback 6 min.  DATE: 10/17/2023 Nustep level 5 x5 min with PT present to discuss status 3 minute walk:  464 ft with SPC Seated hamstring stretch 2x20 sec bilat Sit to/from stand on PT mat with 1 foam cushion x10 (no UE support) Standing at barre:  hip abduction, hip extension, high marching.  2X10 each bilat At barre: Step over hurdle Rt LE shift weight into Rt foot and return 2x5 At barre standing on 2" step:  heel tap down x10 bilat At barre:  standing rocker board DF/PF x2 min   DATE: 10/10/2023 Nustep level 5 x7 min with PT present to discuss status Seated hamstring stretch 2x20 sec bilat Seated ball rollouts for trunk flexion x10 Sit to/from stand from mat table with 2 foam cushions x10 (no UE support) At counter with bil UE support: Rt hip abd and ext At barre: Step over hurdle Rt LE shift weight into Rt foot and return x5 At barre: Side step over hurdle Rt LE with weight shift into Rt foot and return x5 Lunge stance holding barre Rt foot back gentle rocking Rt hip flexor stretch x10 March to 90/90 at barre alt LE x20  with bil UE support Hooklying hip adduction ball squeeze 2x10 Hooklying clamshell with red loop 2x10 Side-lying manual therapy using roller bar to right thigh to promote decreased pain and decreased muscle tightness  PATIENT EDUCATION:  Education details: Issued HEP Person educated: Patient Education method: Programmer, multimedia, Facilities manager, and Handouts Education comprehension: verbalized understanding and returned demonstration  HOME EXERCISE PROGRAM: Access Code: [email or web address] URL: [email or web address] Date: 09/26/2023 Prepared by: Morton Peters  Exercises - Standing March with Counter Support  - 1 x daily - 7 x weekly - 2 sets - 10 reps - Standing Hip Abduction with Counter Support  - 1 x daily - 7 x weekly - 2 sets - 10 reps - Standing Hip Extension with Counter Support  - 1 x daily - 7 x weekly - 2 sets - 10 reps - Mini Squat with Counter Support  - 1 x daily - 7 x weekly - 2 sets - 10 reps - Heel Raises with Counter Support  - 1 x daily - 7 x weekly - 2 sets - 10 reps - Side Stepping with Counter Support  - 1 x daily - 7 x weekly - 3 sets - 10 reps - Supine Hip Adduction Isometric with Ball  - 1 x daily - 7 x weekly - 1 sets - 10 reps - 5 hold - Supine Bridge with Spinal Articulation  - 1 x daily - 7 x weekly - 1 sets - 10 reps - Hooklying Clamshell with Resistance  - 1 x daily - 7 x weekly - 1 sets - 10 reps  ASSESSMENT:  CLINICAL IMPRESSION: Pt loved being at the pool last visit.  She said the movements in the water helped her hip.  She ocntinues to have pain with sit to stand transitions after sitting for extended period of time.  She is very weak in her gluteals which prevent her from ascending stairs with Rt LE lead.  We added leg press today starting with 50/50 effort and progressing to Rt LE with closer to 75% of the effort which was well tolerated.  She continues to have significant anterior hip pocket of swelling which reduced somewhat with STM today.  Pt will  continue to benefit from strengthening, manual therapy and functional mobility training with combination of land and water environments.  OBJECTIVE IMPAIRMENTS: decreased balance, decreased mobility, difficulty walking, decreased strength, increased muscle spasms, impaired flexibility, and pain.   ACTIVITY LIMITATIONS: lifting, bending, standing, squatting, sleeping, and stairs  PARTICIPATION LIMITATIONS: cleaning, laundry, driving, community activity, and church  PERSONAL FACTORS: Past/current experiences and 3+ comorbidities: osteopenia, Hx of left ankle Fx, s/p R THA on 08/25/2023  are also affecting patient's functional outcome.   REHAB POTENTIAL: Good  CLINICAL DECISION MAKING: Evolving/moderate complexity  EVALUATION COMPLEXITY: Moderate   GOALS: Goals reviewed with patient? Yes  SHORT TERM GOALS: Target date: 10/10/2023 Patient will be independent with initial HEP. Baseline: Goal status: Met  2.  Patient will participate in 3 or 6 minute walk test to establish a baseline. Baseline:  Goal status: Met  LONG TERM GOALS: Target date: 11/14/2023  Patient will be independent with advanced HEP to allow for self progression after discharge. Baseline:  Goal status: Ongoing  2.  Patient will increase right hip strength to [facility] to allow her to navigate stairs in her home with reciprocal pattern. Baseline:  Goal status: Ongoing  3.  Patient will be able to walk for at least 15 minutes with LRAD to allow for return to community ambulation. Baseline:  Goal status: Ongoing  4.  Patient will improve Lower Extremity Functional Scale to at least 50% to demonstrate improved functional mobility. Baseline: 37.5% Goal status: INITIAL  5.  Patient will report ability to resume aquatic aerobics class and going on outings with her friends without increased pain. Baseline:  Goal status: INITIAL   PLAN:  PT FREQUENCY: 2x/week  PT DURATION: 8 weeks  PLANNED INTERVENTIONS: 97164- PT  Re-evaluation, 97110-Therapeutic exercises, 97530- Therapeutic activity, 97112- Neuromuscular re-education, 97535- Self Care, 16109- Manual therapy, [phone]- Gait training, [phone]- Aquatic Therapy, 97014- Electrical stimulation (unattended), [phone]- Electrical stimulation (manual), [id number]- Vasopneumatic device, [id number]- Ultrasound, [id number]- Traction (mechanical), [id number]- Ionotophoresis 4mg /ml Dexamethasone, Patient/Family education, Balance training, Stair training, Taping, Dry Needling, Joint mobilization, Joint manipulation, Spinal manipulation, Spinal mobilization, Scar mobilization, Cryotherapy, and Moist heat  PLAN FOR NEXT SESSION: work on glut strength, continue leg press, step ups/downs with 2-4" step, NuStep, walking endurance and confidence, Assess and progress HEP as indicated, strengthening, flexibility, manual/dry needling as indicated, aquatic PT  Morton Peters, PT 10/24/23 11:56 AM      [facility] [address], [address] [place], [place] [address] Phone # [phone] Fax [phone]

## 2023-10-28 ENCOUNTER — Ambulatory Visit (HOSPITAL_BASED_OUTPATIENT_CLINIC_OR_DEPARTMENT_OTHER)
Admission: RE | Admit: 2023-10-28 | Discharge: 2023-10-28 | Disposition: A | Discharge status: Home / Self Care | Payer: Medicare Other | Source: Ambulatory Visit | Attending: Physician Assistant | Admitting: Physician Assistant

## 2023-10-28 DIAGNOSIS — M810 Age-related osteoporosis without current pathological fracture: Secondary | ICD-10-CM | POA: Insufficient documentation

## 2023-10-29 ENCOUNTER — Encounter: Payer: Self-pay | Admitting: Physical Therapy

## 2023-10-29 ENCOUNTER — Ambulatory Visit: Payer: Medicare Other | Admitting: Physical Therapy

## 2023-10-29 DIAGNOSIS — M25551 Pain in right hip: Secondary | ICD-10-CM

## 2023-10-29 DIAGNOSIS — R262 Difficulty in walking, not elsewhere classified: Secondary | ICD-10-CM | POA: Diagnosis not present

## 2023-10-29 DIAGNOSIS — R252 Cramp and spasm: Secondary | ICD-10-CM

## 2023-10-29 DIAGNOSIS — M6281 Muscle weakness (generalized): Secondary | ICD-10-CM

## 2023-10-29 DIAGNOSIS — G8929 Other chronic pain: Secondary | ICD-10-CM

## 2023-10-29 DIAGNOSIS — R293 Abnormal posture: Secondary | ICD-10-CM

## 2023-10-29 NOTE — Therapy (Signed)
 OUTPATIENT PHYSICAL THERAPY TREATMENT NOTE   Patient Name: Linda [name] MRN: [id number] DOB:[birth year], 83 y.o., female Today's Date: 10/29/2023  END OF SESSION:  PT End of Session - 10/29/23 1055     Visit Number 9    Date for PT Re-Evaluation 11/14/23    Authorization Type BC/BS Medicare    Progress Note Due on Visit 10    PT Start Time 1055    PT Stop Time 1140    PT Time Calculation (min) 45 min    Activity Tolerance Patient tolerated treatment well    Behavior During Therapy WFL for tasks assessed/performed                 Past Medical History:  Diagnosis Date   ALLERGIC RHINITIS    Allergy    ANEMIA-IRON DEFICIENCY    Anxiety state, unspecified    Carpal tunnel syndrome    pt denies    Cataract    Closed left ankle fracture    COLONIC POLYPS, HX OF 2007   GERD    GLAUCOMA    Glaucoma    GOITER, MULTINODULAR    on [place] 2006, unchanged 6/13 with nodules all <47mm   HIATAL HERNIA WITH REFLUX    Hip dysplasia, acquired    B sx; eval at [facility] for same 01/2015 -ongoing PT   OSTEOARTHRITIS    Osteoporosis    Serrated polyp of colon    TOBACCO USE, QUIT    Past Surgical History:  Procedure Laterality Date   BREAST SURGERY  1960   Left breast cyst removed no complications   CARPAL TUNNEL RELEASE Right    COLONOSCOPY  2005   EYE SURGERY  1991-1992   both eyes   EYE SURGERY Left 2014   shunt behind left eye   ORIF FIBULA FRACTURE Left 08/24/2019   Procedure: OPEN REDUCTION INTERNAL FIXATION (ORIF) LEFT DISTAL FIBULA FRACTURE;  Surgeon: Teryl Lucy, MD;  Location: [facility];  Service: Orthopedics;  Laterality: Left;   Right shoulder aurgery  10/2008   TOTAL HIP ARTHROPLASTY Right 08/25/2023   Procedure: RIGHT TOTAL HIP REPLACEMENT;  Surgeon: Tarry Kos, MD;  Location: [facility] OR;  Service: Orthopedics;  Laterality: Right;  3-C   Patient Active Problem List   Diagnosis Date Noted   Status post total replacement of right hip 08/25/2023    Degenerative joint disease of right hip 08/24/2023   Spinal stenosis of lumbar region 10/09/2022   Facet arthritis of lumbar region 10/09/2022   Chronic bilateral low back pain without sciatica 10/04/2022   Osteopenia 11/26/2021   Pre-diabetes 11/26/2021   Other fatigue 07/28/2020   Dupuytren's disease of palm 01/19/2018   Viral URI 01/23/2017   Bilateral sensorineural hearing loss 03/12/2016   Routine general medical examination at a health care facility 07/22/2015   Stress fracture of neck of right femur 01/25/2015   Anemia 11/06/2012   Anxiety 11/06/2012   Depression 11/06/2012   Cataract, nuclear 10/28/2012   Primary open angle glaucoma of both eyes, indeterminate stage 10/28/2012   Ptosis of eyelid 10/28/2012   Bladder prolapse, female, acquired 01/09/2012   GERD (gastroesophageal reflux disease) 05/21/2007    PCP: Myrlene Broker, MD  REFERRING PROVIDER: Cristie Hem, PA-C  REFERRING DIAG: [id number] (ICD-10-CM) - Status post total replacement of right hip  THERAPY DIAG:  Difficulty in walking, not elsewhere classified  Muscle weakness (generalized)  Pain in right hip  Cramp and spasm  Abnormal posture  Chronic bilateral  low back pain without sciatica  Rationale for Evaluation and Treatment: Rehabilitation  ONSET DATE: S/P Right THA on 08/25/2023  SUBJECTIVE:   SUBJECTIVE STATEMENT: I did great after my first pool treatment. I went to the pool at [facility] this week and did what I could.  PERTINENT HISTORY: Hx of back pain, R THA on 08/25/23, OA, Hx of Left ankle fracture in 2021, osteopenia  PAIN:  Are you having pain? Not right this minute  PRECAUTIONS: None  RED FLAGS: None   WEIGHT BEARING RESTRICTIONS: No  FALLS:  Has patient fallen in last 6 months? Yes. Number of falls 1 fall before surgery when she missed a step and fell at her daughter's home  LIVING ENVIRONMENT: Lives with: lives alone Lives in: House/apartment Stairs:   two story home Has following equipment at home: Single point cane, Environmental consultant - 2 wheeled, shower chair, and bed side commode  OCCUPATION: Retired  PLOF: Independent and Vocation/Vocational requirements: water aerobics, church activities  PATIENT GOALS: To be independent again and return to driving and community activities.  NEXT MD VISIT: Osteoporosis Clinic with [place] Persons, PA on 09/22/23  OBJECTIVE:  Note: Objective measures were completed at Evaluation unless otherwise noted.  DIAGNOSTIC FINDINGS:  Pelvic Radiograph on 08/25/2023: IMPRESSION: Right hip arthroplasty without immediate postoperative complication.  PATIENT SURVEYS:  Eval:  LEFS 30 / 80 = 37.5 %  COGNITION: Overall cognitive status: Within functional limits for tasks assessed     SENSATION: Patient states some numbness at incision site.  MUSCLE LENGTH: Hamstrings: Right tightness noted  POSTURE: rounded shoulders and forward head  PALPATION: Tender to palpation along incision site  LOWER EXTREMITY ROM:  WFL  LOWER EXTREMITY MMT:  Eval:   Right hip strength grossly 4 to 4+/5 Right quad and hamstring strength of 5-/5 Left LE strength is WFL   FUNCTIONAL TESTS:  Eval: 5 times sit to stand: 21.66 with required use of UE pushing chair handles Timed up and go (TUG): 20.66 sec with RW  09/26/23:  3 minute walk: covered 78' with gait belt and no AD  10/17/2023: 3 minute walk:  464 ft with SPC  GAIT: Distance walked: >200 ft Assistive device utilized: Environmental consultant - 2 wheeled Level of assistance: Modified independence Comments: Pt with antalgic gait pattern                                                                                                                                TODAY'S TREATMENT   10/29/23:Pt arrives for aquatic physical therapy. Treatment took place in 3.5-5.5 feet of water. Water temperature was 92 degrees F Pt entered the pool via stairs reciprocally but slow. Seated water  bench with 75% submersion Pt performed seated LE AROM exercises 20x in all planes, concurrent discussion of her current status.Pt requires buoyancy of water for support and to offload joints with strengthening exercises.   75% depth water walking with UE push/pull with  running arms 10x each direction. Standing marching holding the small noodle 10x. Bil hip kicks 3 ways 10x each holding on for balance.Step ups forward 10x, sideways 2x5. Soft tissue work to Rt anterior hip. Heel lifts 15x. Underwater bicycle on horseback 8 min.  10/24/23: NuStep L5 x 5' PT present to discuss status Standing heel raises bil x20 3-way hip with single UE support at barre x5 cycles Standing alt march x20 bil UE support Standing L stretch at barre for lumbar spine  Leg press 50lb 2x25 - first set 50/50 effort between legs, 2nd set 75/25 effort Rt/Lt At barre: Step over hurdle fwd and sideways and return 2x5 bil Manual therapy: supine STM to anterior Rt hip to reduce swelling and improve soft tissue extensibility   10/22/23: Pt arrives for aquatic physical therapy. Treatment took place in 3.5-5.5 feet of water. Water temperature was 91 degrees F Pt entered the pool via stairs reciprocally but slow. Seated water bench with 75% submersion Pt performed seated LE AROM exercises 20x in all planes, concurrent discussion of her current status.Pt requires buoyancy of water for support and to offload joints with strengthening exercises.   75% depth water walking with UE push/pull with hand floats. 6x each direction. Standing marching holding the small noodle 2x6. Bil hip kicks 3 ways 6x each holding on for balance. Soft tissue work to Rt anterior hip. Heel lifts 15x. Underwater bicycle on horseback 6 min.  PATIENT EDUCATION:  Education details: Issued HEP Person educated: Patient Education method: Explanation, Facilities manager, and Handouts Education comprehension: verbalized understanding and returned demonstration  HOME  EXERCISE PROGRAM: Access Code: [email or web address] URL: [email or web address] Date: 09/26/2023 Prepared by: Morton Peters  Exercises - Standing March with Counter Support  - 1 x daily - 7 x weekly - 2 sets - 10 reps - Standing Hip Abduction with Counter Support  - 1 x daily - 7 x weekly - 2 sets - 10 reps - Standing Hip Extension with Counter Support  - 1 x daily - 7 x weekly - 2 sets - 10 reps - Mini Squat with Counter Support  - 1 x daily - 7 x weekly - 2 sets - 10 reps - Heel Raises with Counter Support  - 1 x daily - 7 x weekly - 2 sets - 10 reps - Side Stepping with Counter Support  - 1 x daily - 7 x weekly - 3 sets - 10 reps - Supine Hip Adduction Isometric with Ball  - 1 x daily - 7 x weekly - 1 sets - 10 reps - 5 hold - Supine Bridge with Spinal Articulation  - 1 x daily - 7 x weekly - 1 sets - 10 reps - Hooklying Clamshell with Resistance  - 1 x daily - 7 x weekly - 1 sets - 10 reps  ASSESSMENT:  CLINICAL IMPRESSION:Pt tolerated her initial aquatic session very well, no increased pain. A little bit of "tugging" upon initiation of side steps but this abolished with more reps.Overall workload was increased today.  OBJECTIVE IMPAIRMENTS: decreased balance, decreased mobility, difficulty walking, decreased strength, increased muscle spasms, impaired flexibility, and pain.   ACTIVITY LIMITATIONS: lifting, bending, standing, squatting, sleeping, and stairs  PARTICIPATION LIMITATIONS: cleaning, laundry, driving, community activity, and church  PERSONAL FACTORS: Past/current experiences and 3+ comorbidities: osteopenia, Hx of left ankle Fx, s/p R THA on 08/25/2023  are also affecting patient's functional outcome.   REHAB POTENTIAL: Good  CLINICAL DECISION MAKING: Evolving/moderate complexity  EVALUATION COMPLEXITY: Moderate   GOALS:  Goals reviewed with patient? Yes  SHORT TERM GOALS: Target date: 10/10/2023 Patient will be independent with initial HEP. Baseline: Goal  status: Met  2.  Patient will participate in 3 or 6 minute walk test to establish a baseline. Baseline:  Goal status: Met  LONG TERM GOALS: Target date: 11/14/2023  Patient will be independent with advanced HEP to allow for self progression after discharge. Baseline:  Goal status: Ongoing  2.  Patient will increase right hip strength to [facility] to allow her to navigate stairs in her home with reciprocal pattern. Baseline:  Goal status: Ongoing  3.  Patient will be able to walk for at least 15 minutes with LRAD to allow for return to community ambulation. Baseline:  Goal status: Ongoing  4.  Patient will improve Lower Extremity Functional Scale to at least 50% to demonstrate improved functional mobility. Baseline: 37.5% Goal status: INITIAL  5.  Patient will report ability to resume aquatic aerobics class and going on outings with her friends without increased pain. Baseline:  Goal status: INITIAL   PLAN:  PT FREQUENCY: 2x/week  PT DURATION: 8 weeks  PLANNED INTERVENTIONS: 97164- PT Re-evaluation, 97110-Therapeutic exercises, 97530- Therapeutic activity, 97112- Neuromuscular re-education, 97535- Self Care, 16109- Manual therapy, [phone]- Gait training, [phone]- Aquatic Therapy, 97014- Electrical stimulation (unattended), [phone]- Electrical stimulation (manual), [id number]- Vasopneumatic device, [id number]- Ultrasound, [id number]- Traction (mechanical), [id number]- Ionotophoresis 4mg /ml Dexamethasone, Patient/Family education, Balance training, Stair training, Taping, Dry Needling, Joint mobilization, Joint manipulation, Spinal manipulation, Spinal mobilization, Scar mobilization, Cryotherapy, and Moist heat  PLAN FOR NEXT SESSION: work on glut strength, continue leg press, step ups/downs with 2-4" step, NuStep, walking endurance and confidence, Assess and progress HEP as indicated, strengthening, flexibility, manual/dry needling as indicated, aquatic PT  Ane Payment, PTA 10/29/23 11:41 AM       [facility] [address], [address] [place], [place] [address] Phone # [phone] Fax [phone]

## 2023-10-30 ENCOUNTER — Telehealth: Payer: Self-pay | Admitting: Physician Assistant

## 2023-10-30 NOTE — Telephone Encounter (Signed)
 Patient returning a call to [facility].

## 2023-10-31 ENCOUNTER — Encounter: Payer: Self-pay | Admitting: Physical Therapy

## 2023-10-31 ENCOUNTER — Ambulatory Visit: Payer: Medicare Other | Admitting: Physical Therapy

## 2023-10-31 DIAGNOSIS — R262 Difficulty in walking, not elsewhere classified: Secondary | ICD-10-CM

## 2023-10-31 DIAGNOSIS — M25551 Pain in right hip: Secondary | ICD-10-CM

## 2023-10-31 DIAGNOSIS — R252 Cramp and spasm: Secondary | ICD-10-CM

## 2023-10-31 DIAGNOSIS — M6281 Muscle weakness (generalized): Secondary | ICD-10-CM

## 2023-10-31 NOTE — Telephone Encounter (Signed)
 VOB submitted for [facility] and [facility]

## 2023-10-31 NOTE — Therapy (Signed)
 OUTPATIENT PHYSICAL THERAPY PROGRESS NOTE  Progress Note Reporting Period 09/18/23 to 10/31/23  See note below for Objective Data and Assessment of Progress/Goals.       Patient Name: Linda [name] MRN: [id number] DOB:09/10/1940, 83 y.o., female Today's Date: 10/31/2023  END OF SESSION:  PT End of Session - 10/31/23 1017     Visit Number 10    Date for PT Re-Evaluation 12/12/23    Authorization Type BC/BS Medicare    Progress Note Due on Visit 20    PT Start Time 1017    PT Stop Time 1100    PT Time Calculation (min) 43 min    Activity Tolerance Patient tolerated treatment well    Behavior During Therapy WFL for tasks assessed/performed                  Past Medical History:  Diagnosis Date   ALLERGIC RHINITIS    Allergy    ANEMIA-IRON DEFICIENCY    Anxiety state, unspecified    Carpal tunnel syndrome    pt denies    Cataract    Closed left ankle fracture    COLONIC POLYPS, HX OF 2007   GERD    GLAUCOMA    Glaucoma    GOITER, MULTINODULAR    on [place] 2006, unchanged 6/13 with nodules all <74mm   HIATAL HERNIA WITH REFLUX    Hip dysplasia, acquired    B sx; eval at [facility] for same 01/2015 -ongoing PT   OSTEOARTHRITIS    Osteoporosis    Serrated polyp of colon    TOBACCO USE, QUIT    Past Surgical History:  Procedure Laterality Date   BREAST SURGERY  1960   Left breast cyst removed no complications   CARPAL TUNNEL RELEASE Right    COLONOSCOPY  2005   EYE SURGERY  1991-1992   both eyes   EYE SURGERY Left 2014   shunt behind left eye   ORIF FIBULA FRACTURE Left 08/24/2019   Procedure: OPEN REDUCTION INTERNAL FIXATION (ORIF) LEFT DISTAL FIBULA FRACTURE;  Surgeon: Teryl Lucy, MD;  Location: [facility];  Service: Orthopedics;  Laterality: Left;   Right shoulder aurgery  10/2008   TOTAL HIP ARTHROPLASTY Right 08/25/2023   Procedure: RIGHT TOTAL HIP REPLACEMENT;  Surgeon: Tarry Kos, MD;  Location: [facility] OR;  Service: Orthopedics;   Laterality: Right;  3-C   Patient Active Problem List   Diagnosis Date Noted   Status post total replacement of right hip 08/25/2023   Degenerative joint disease of right hip 08/24/2023   Spinal stenosis of lumbar region 10/09/2022   Facet arthritis of lumbar region 10/09/2022   Chronic bilateral low back pain without sciatica 10/04/2022   Osteopenia 11/26/2021   Pre-diabetes 11/26/2021   Other fatigue 07/28/2020   Dupuytren's disease of palm 01/19/2018   Viral URI 01/23/2017   Bilateral sensorineural hearing loss 03/12/2016   Routine general medical examination at a health care facility 07/22/2015   Stress fracture of neck of right femur 01/25/2015   Anemia 11/06/2012   Anxiety 11/06/2012   Depression 11/06/2012   Cataract, nuclear 10/28/2012   Primary open angle glaucoma of both eyes, indeterminate stage 10/28/2012   Ptosis of eyelid 10/28/2012   Bladder prolapse, female, acquired 01/09/2012   GERD (gastroesophageal reflux disease) 05/21/2007    PCP: Myrlene Broker, MD  REFERRING PROVIDER: Cristie Hem, PA-C  REFERRING DIAG: [phone] (ICD-10-CM) - Status post total replacement of right hip  THERAPY DIAG:  Difficulty  in walking, not elsewhere classified  Muscle weakness (generalized)  Pain in right hip  Cramp and spasm  Rationale for Evaluation and Treatment: Rehabilitation  ONSET DATE: S/P Right THA on 08/25/2023  SUBJECTIVE:   SUBJECTIVE STATEMENT:  If I keep moving I am better.  The pain comes and goes.   PERTINENT HISTORY: Hx of back pain, R THA on 08/25/23, OA, Hx of Left ankle fracture in 2021, osteopenia  PAIN:  Are you having pain? Not right this minute  PRECAUTIONS: None  RED FLAGS: None   WEIGHT BEARING RESTRICTIONS: No  FALLS:  Has patient fallen in last 6 months? Yes. Number of falls 1 fall before surgery when she missed a step and fell at her daughter's home  LIVING ENVIRONMENT: Lives with: lives alone Lives in:  House/apartment Stairs:  two story home Has following equipment at home: Single point cane, Environmental consultant - 2 wheeled, shower chair, and bed side commode  OCCUPATION: Retired  PLOF: Independent and Vocation/Vocational requirements: water aerobics, church activities  PATIENT GOALS: To be independent again and return to driving and community activities.  NEXT MD VISIT: Osteoporosis Clinic with [place] Persons, PA on 09/22/23  OBJECTIVE:  Note: Objective measures were completed at Evaluation unless otherwise noted.  DIAGNOSTIC FINDINGS:  Pelvic Radiograph on 08/25/2023: IMPRESSION: Right hip arthroplasty without immediate postoperative complication.  PATIENT SURVEYS:  10/31/23: LEFS  /80 =  Eval:  LEFS 30 / 80 = 37.5 %  COGNITION: Overall cognitive status: Within functional limits for tasks assessed     SENSATION: Patient states some numbness at incision site.  MUSCLE LENGTH: Hamstrings: Right tightness noted  POSTURE: rounded shoulders and forward head  PALPATION: Tender to palpation along incision site  LOWER EXTREMITY ROM:  WFL  LOWER EXTREMITY MMT: 10/31/23: Rt hip extension 4/5 Rt hip flexion 4/5 with anterior hip pain Rt hip abduction 4/5 Rt Knee 5/5   Eval:   Right hip strength grossly 4 to 4+/5 Right quad and hamstring strength of 5-/5 Left LE strength is WFL   FUNCTIONAL TESTS:  Eval: 5 times sit to stand: 21.66 with required use of UE pushing chair handles Timed up and go (TUG): 20.66 sec with RW  09/26/23:  3 minute walk: covered 8' with gait belt and no AD  10/17/2023: 3 minute walk:  464 ft with [facility]  10/31/2023: 3 minute walk:  445 ft with SPC 5 X STS: 14.89 with light use of hands on thighs TUG: 15.90 with SPC, 15.10  GAIT: Distance walked: >200 ft Assistive device utilized: Environmental consultant - 2 wheeled Level of assistance: Modified independence Comments: Pt with antalgic gait pattern                                                                                                                                 TODAY'S TREATMENT  10/31/23: NuStep L5 x 5' PT present to discuss status and plan for session Rt hip flexor stretch  standing with bil UE support on barre 2x30" LE warm up at barre: 6x alt LE march, hip ext, hip abd, heel raise , TUG, 5x STS and MMT Sit to stand from airex pad on mat table with lean and UE reach to stand x10 Sit to stand from mat table without airex pad x5 with lean and UE reach Seated hip abd yellow loop x 20 (to address Lt knee valgus on sit to stand) Supine bridge with yellow loop at knees x10 Leg press 55lb 2x10 - first set 50/50 effort between legs, 2nd set 75/25 effort Rt/Lt  10/29/23:Pt arrives for aquatic physical therapy. Treatment took place in 3.5-5.5 feet of water. Water temperature was 92 degrees F Pt entered the pool via stairs reciprocally but slow. Seated water bench with 75% submersion Pt performed seated LE AROM exercises 20x in all planes, concurrent discussion of her current status.Pt requires buoyancy of water for support and to offload joints with strengthening exercises.   75% depth water walking with UE push/pull with running arms 10x each direction. Standing marching holding the small noodle 10x. Bil hip kicks 3 ways 10x each holding on for balance.Step ups forward 10x, sideways 2x5. Soft tissue work to Rt anterior hip. Heel lifts 15x. Underwater bicycle on horseback 8 min.  10/24/23: NuStep L5 x 5' PT present to discuss status Standing heel raises bil x20 3-way hip with single UE support at barre x5 cycles Standing alt march x20 bil UE support Standing L stretch at barre for lumbar spine  Leg press 50lb 2x25 - first set 50/50 effort between legs, 2nd set 75/25 effort Rt/Lt At barre: Step over hurdle fwd and sideways and return 2x5 bil Manual therapy: supine STM to anterior Rt hip to reduce swelling and improve soft tissue extensibility   10/22/23: Pt arrives for aquatic physical therapy.  Treatment took place in 3.5-5.5 feet of water. Water temperature was 91 degrees F Pt entered the pool via stairs reciprocally but slow. Seated water bench with 75% submersion Pt performed seated LE AROM exercises 20x in all planes, concurrent discussion of her current status.Pt requires buoyancy of water for support and to offload joints with strengthening exercises.   75% depth water walking with UE push/pull with hand floats. 6x each direction. Standing marching holding the small noodle 2x6. Bil hip kicks 3 ways 6x each holding on for balance. Soft tissue work to Rt anterior hip. Heel lifts 15x. Underwater bicycle on horseback 6 min.  PATIENT EDUCATION:  Education details: Issued HEP Person educated: Patient Education method: Explanation, Facilities manager, and Handouts Education comprehension: verbalized understanding and returned demonstration  HOME EXERCISE PROGRAM: Access Code: [email or web address] URL: [email or web address] Date: 09/26/2023 Prepared by: Morton Peters  Exercises - Standing March with Counter Support  - 1 x daily - 7 x weekly - 2 sets - 10 reps - Standing Hip Abduction with Counter Support  - 1 x daily - 7 x weekly - 2 sets - 10 reps - Standing Hip Extension with Counter Support  - 1 x daily - 7 x weekly - 2 sets - 10 reps - Mini Squat with Counter Support  - 1 x daily - 7 x weekly - 2 sets - 10 reps - Heel Raises with Counter Support  - 1 x daily - 7 x weekly - 2 sets - 10 reps - Side Stepping with Counter Support  - 1 x daily - 7 x weekly - 3 sets - 10 reps - Supine Hip Adduction Isometric with [employer]  -  1 x daily - 7 x weekly - 1 sets - 10 reps - 5 hold - Supine Bridge with Spinal Articulation  - 1 x daily - 7 x weekly - 1 sets - 10 reps - Hooklying Clamshell with Resistance  - 1 x daily - 7 x weekly - 1 sets - 10 reps  ASSESSMENT:  CLINICAL IMPRESSION: Pt demos improving gait quality and endurance with less use of AD.  She is walking with [facility] for longer  distances and covered nearly 450' today with .  She has reduced her time for both 5x STS and TUG test demo'ing improved functional mobility, strength and reduced fall risk.  She was able for the first time today to perform reps of STS from low mat table without airex pad as booster using cues to lean and reach forward with UE.  She continues to be weak in anterior, lateral and posterior aspects of Rt hip with significant swelling pocket in anterior hip.  MD stated that her xrays show good hip alignment with hardware.  She has some pain with transition to activity after prolonged sitting but does well if she "keeps moving."  Pt will continue to benefit from skilled PT to work on strength and functional activities to maximize her safety and participation in daily tasks.   OBJECTIVE IMPAIRMENTS: decreased balance, decreased mobility, difficulty walking, decreased strength, increased muscle spasms, impaired flexibility, and pain.   ACTIVITY LIMITATIONS: lifting, bending, standing, squatting, sleeping, and stairs  PARTICIPATION LIMITATIONS: cleaning, laundry, driving, community activity, and church  PERSONAL FACTORS: Past/current experiences and 3+ comorbidities: osteopenia, Hx of left ankle Fx, s/p R THA on 08/25/2023  are also affecting patient's functional outcome.   REHAB POTENTIAL: Good  CLINICAL DECISION MAKING: Evolving/moderate complexity  EVALUATION COMPLEXITY: Moderate   GOALS: Goals reviewed with patient? Yes  SHORT TERM GOALS: Target date: 10/10/2023 Patient will be independent with initial HEP. Baseline: Goal status: Met  2.  Patient will participate in 3 or 6 minute walk test to establish a baseline. Baseline:  Goal status: Met  LONG TERM GOALS: Target date: 11/14/2023  Patient will be independent with advanced HEP to allow for self progression after discharge. Baseline:  Goal status: Ongoing 3/21  2.  Patient will increase right hip strength to [facility] to allow her to  navigate stairs in her home with reciprocal pattern. Baseline:  Goal status: Ongoing 3/21  3.  Patient will be able to walk for at least 15 minutes with LRAD to allow for return to community ambulation. Baseline:  Goal status: Ongoing 3/21  4.  Patient will improve Lower Extremity Functional Scale to at least 50% to demonstrate improved functional mobility. Baseline: 37.5% Goal status: ongoing   5.  Patient will report ability to resume aquatic aerobics class and going on outings with her friends without increased pain. Baseline:  Goal status: ongoing - went twice last week and modified 3/21   PLAN:  PT FREQUENCY: 2x/week  PT DURATION: 8 weeks  PLANNED INTERVENTIONS: 97164- PT Re-evaluation, 97110-Therapeutic exercises, 97530- Therapeutic activity, 97112- Neuromuscular re-education, 97535- Self Care, 56387- Manual therapy, [id number]- Gait training, [phone]- Aquatic Therapy, 97014- Electrical stimulation (unattended), [id number]- Electrical stimulation (manual), [id number]- Vasopneumatic device, [id number]- Ultrasound, [id number]- Traction (mechanical), [id number]- Ionotophoresis 4mg /ml Dexamethasone, Patient/Family education, Balance training, Stair training, Taping, Dry Needling, Joint mobilization, Joint manipulation, Spinal manipulation, Spinal mobilization, Scar mobilization, Cryotherapy, and Moist heat  PLAN FOR NEXT SESSION: remove progress note at top of note next time, work on glut strength, continue leg  press, step ups/downs with 2-4" step, NuStep, walking endurance and confidence, Assess and progress HEP as indicated, strengthening, flexibility, manual/dry needling as indicated, aquatic PT  Morton Peters, PT 10/31/23 12:05 PM      [facility] [address] [place], [place] [address] Phone # [phone] Fax [phone]

## 2023-11-03 ENCOUNTER — Encounter: Admitting: Internal Medicine

## 2023-11-04 NOTE — Therapy (Unsigned)
 OUTPATIENT PHYSICAL THERAPY PROGRESS NOTE  Progress Note Reporting Period 09/18/23 to 10/31/23  See note below for Objective Data and Assessment of Progress/Goals.       Patient Name: Linda [name] MRN: [id number] DOB:Sep 21, 1940, 83 y.o., female Today's Date: 11/04/2023  END OF SESSION:         Past Medical History:  Diagnosis Date   ALLERGIC RHINITIS    Allergy    ANEMIA-IRON DEFICIENCY    Anxiety state, unspecified    Carpal tunnel syndrome    pt denies    Cataract    Closed left ankle fracture    COLONIC POLYPS, HX OF 2007   GERD    GLAUCOMA    Glaucoma    GOITER, MULTINODULAR    on [place] 2006, unchanged 6/13 with nodules all <53mm   HIATAL HERNIA WITH REFLUX    Hip dysplasia, acquired    B sx; eval at [facility] for same 01/2015 -ongoing PT   OSTEOARTHRITIS    Osteoporosis    Serrated polyp of colon    TOBACCO USE, QUIT    Past Surgical History:  Procedure Laterality Date   BREAST SURGERY  1960   Left breast cyst removed no complications   CARPAL TUNNEL RELEASE Right    COLONOSCOPY  2005   EYE SURGERY  1991-1992   both eyes   EYE SURGERY Left 2014   shunt behind left eye   ORIF FIBULA FRACTURE Left 08/24/2019   Procedure: OPEN REDUCTION INTERNAL FIXATION (ORIF) LEFT DISTAL FIBULA FRACTURE;  Surgeon: Teryl Lucy, MD;  Location: [facility];  Service: Orthopedics;  Laterality: Left;   Right shoulder aurgery  10/2008   TOTAL HIP ARTHROPLASTY Right 08/25/2023   Procedure: RIGHT TOTAL HIP REPLACEMENT;  Surgeon: Tarry Kos, MD;  Location: [facility] OR;  Service: Orthopedics;  Laterality: Right;  3-C   Patient Active Problem List   Diagnosis Date Noted   Status post total replacement of right hip 08/25/2023   Degenerative joint disease of right hip 08/24/2023   Spinal stenosis of lumbar region 10/09/2022   Facet arthritis of lumbar region 10/09/2022   Chronic bilateral low back pain without sciatica 10/04/2022   Osteopenia 11/26/2021   Pre-diabetes  11/26/2021   Other fatigue 07/28/2020   Dupuytren's disease of palm 01/19/2018   Viral URI 01/23/2017   Bilateral sensorineural hearing loss 03/12/2016   Routine general medical examination at a health care facility 07/22/2015   Stress fracture of neck of right femur 01/25/2015   Anemia 11/06/2012   Anxiety 11/06/2012   Depression 11/06/2012   Cataract, nuclear 10/28/2012   Primary open angle glaucoma of both eyes, indeterminate stage 10/28/2012   Ptosis of eyelid 10/28/2012   Bladder prolapse, female, acquired 01/09/2012   GERD (gastroesophageal reflux disease) 05/21/2007    PCP: Myrlene Broker, MD  REFERRING PROVIDER: Cristie Hem, PA-C  REFERRING DIAG: [phone] (ICD-10-CM) - Status post total replacement of right hip  THERAPY DIAG:  Difficulty in walking, not elsewhere classified  Muscle weakness (generalized)  Pain in right hip  Cramp and spasm  Abnormal posture  Chronic bilateral low back pain without sciatica  Rationale for Evaluation and Treatment: Rehabilitation  ONSET DATE: S/P Right THA on 08/25/2023  SUBJECTIVE:   SUBJECTIVE STATEMENT:  If I keep moving I am better.  The pain comes and goes.   PERTINENT HISTORY: Hx of back pain, R THA on 08/25/23, OA, Hx of Left ankle fracture in 2021, osteopenia  PAIN:  Are you  having pain? Not right this minute  PRECAUTIONS: None  RED FLAGS: None   WEIGHT BEARING RESTRICTIONS: No  FALLS:  Has patient fallen in last 6 months? Yes. Number of falls 1 fall before surgery when she missed a step and fell at her daughter's home  LIVING ENVIRONMENT: Lives with: lives alone Lives in: House/apartment Stairs:  two story home Has following equipment at home: Single point cane, Environmental consultant - 2 wheeled, shower chair, and bed side commode  OCCUPATION: Retired  PLOF: Independent and Vocation/Vocational requirements: water aerobics, church activities  PATIENT GOALS: To be independent again and return to driving  and community activities.  NEXT MD VISIT: Osteoporosis Clinic with [place] Persons, PA on 09/22/23  OBJECTIVE:  Note: Objective measures were completed at Evaluation unless otherwise noted.  DIAGNOSTIC FINDINGS:  Pelvic Radiograph on 08/25/2023: IMPRESSION: Right hip arthroplasty without immediate postoperative complication.  PATIENT SURVEYS:  10/31/23: LEFS  /80 =  Eval:  LEFS 30 / 80 = 37.5 %  COGNITION: Overall cognitive status: Within functional limits for tasks assessed     SENSATION: Patient states some numbness at incision site.  MUSCLE LENGTH: Hamstrings: Right tightness noted  POSTURE: rounded shoulders and forward head  PALPATION: Tender to palpation along incision site  LOWER EXTREMITY ROM:  WFL  LOWER EXTREMITY MMT: 10/31/23: Rt hip extension 4/5 Rt hip flexion 4/5 with anterior hip pain Rt hip abduction 4/5 Rt Knee 5/5   Eval:   Right hip strength grossly 4 to 4+/5 Right quad and hamstring strength of 5-/5 Left LE strength is WFL   FUNCTIONAL TESTS:  Eval: 5 times sit to stand: 21.66 with required use of UE pushing chair handles Timed up and go (TUG): 20.66 sec with RW  09/26/23:  3 minute walk: covered 25' with gait belt and no AD  10/17/2023: 3 minute walk:  464 ft with [facility]  10/31/2023: 3 minute walk:  445 ft with SPC 5 X STS: 14.89 with light use of hands on thighs TUG: 15.90 with SPC, 15.10  GAIT: Distance walked: >200 ft Assistive device utilized: Environmental consultant - 2 wheeled Level of assistance: Modified independence Comments: Pt with antalgic gait pattern                                                                                                                                TODAY'S TREATMENT  10/31/23: NuStep L5 x 5' PT present to discuss status and plan for session Rt hip flexor stretch standing with bil UE support on barre 2x30" LE warm up at barre: 6x alt LE march, hip ext, hip abd, heel raise , TUG, 5x STS and MMT Sit to  stand from airex pad on mat table with lean and UE reach to stand x10 Sit to stand from mat table without airex pad x5 with lean and UE reach Seated hip abd yellow loop x 20 (to address Lt knee valgus on sit to stand) Supine bridge  with yellow loop at knees x10 Leg press 55lb 2x10 - first set 50/50 effort between legs, 2nd set 75/25 effort Rt/Lt  10/29/23:Pt arrives for aquatic physical therapy. Treatment took place in 3.5-5.5 feet of water. Water temperature was 92 degrees F Pt entered the pool via stairs reciprocally but slow. Seated water bench with 75% submersion Pt performed seated LE AROM exercises 20x in all planes, concurrent discussion of her current status.Pt requires buoyancy of water for support and to offload joints with strengthening exercises.   75% depth water walking with UE push/pull with running arms 10x each direction. Standing marching holding the small noodle 10x. Bil hip kicks 3 ways 10x each holding on for balance.Step ups forward 10x, sideways 2x5. Soft tissue work to Rt anterior hip. Heel lifts 15x. Underwater bicycle on horseback 8 min.  10/24/23: NuStep L5 x 5' PT present to discuss status Standing heel raises bil x20 3-way hip with single UE support at barre x5 cycles Standing alt march x20 bil UE support Standing L stretch at barre for lumbar spine  Leg press 50lb 2x25 - first set 50/50 effort between legs, 2nd set 75/25 effort Rt/Lt At barre: Step over hurdle fwd and sideways and return 2x5 bil Manual therapy: supine STM to anterior Rt hip to reduce swelling and improve soft tissue extensibility   10/22/23: Pt arrives for aquatic physical therapy. Treatment took place in 3.5-5.5 feet of water. Water temperature was 91 degrees F Pt entered the pool via stairs reciprocally but slow. Seated water bench with 75% submersion Pt performed seated LE AROM exercises 20x in all planes, concurrent discussion of her current status.Pt requires buoyancy of water for support and to  offload joints with strengthening exercises.   75% depth water walking with UE push/pull with hand floats. 6x each direction. Standing marching holding the small noodle 2x6. Bil hip kicks 3 ways 6x each holding on for balance. Soft tissue work to Rt anterior hip. Heel lifts 15x. Underwater bicycle on horseback 6 min.  PATIENT EDUCATION:  Education details: Issued HEP Person educated: Patient Education method: Explanation, Facilities manager, and Handouts Education comprehension: verbalized understanding and returned demonstration  HOME EXERCISE PROGRAM: Access Code: [email or web address] URL: [email or web address] Date: 09/26/2023 Prepared by: Morton Peters  Exercises - Standing March with Counter Support  - 1 x daily - 7 x weekly - 2 sets - 10 reps - Standing Hip Abduction with Counter Support  - 1 x daily - 7 x weekly - 2 sets - 10 reps - Standing Hip Extension with Counter Support  - 1 x daily - 7 x weekly - 2 sets - 10 reps - Mini Squat with Counter Support  - 1 x daily - 7 x weekly - 2 sets - 10 reps - Heel Raises with Counter Support  - 1 x daily - 7 x weekly - 2 sets - 10 reps - Side Stepping with Counter Support  - 1 x daily - 7 x weekly - 3 sets - 10 reps - Supine Hip Adduction Isometric with Ball  - 1 x daily - 7 x weekly - 1 sets - 10 reps - 5 hold - Supine Bridge with Spinal Articulation  - 1 x daily - 7 x weekly - 1 sets - 10 reps - Hooklying Clamshell with Resistance  - 1 x daily - 7 x weekly - 1 sets - 10 reps  ASSESSMENT:  CLINICAL IMPRESSION: Pt demos improving gait quality and endurance with less use of AD.  She is walking  with [facility] for longer distances and covered nearly 450' today with .  She has reduced her time for both 5x STS and TUG test demo'ing improved functional mobility, strength and reduced fall risk.  She was able for the first time today to perform reps of STS from low mat table without airex pad as booster using cues to lean and reach forward with UE.   She continues to be weak in anterior, lateral and posterior aspects of Rt hip with significant swelling pocket in anterior hip.  MD stated that her xrays show good hip alignment with hardware.  She has some pain with transition to activity after prolonged sitting but does well if she "keeps moving."  Pt will continue to benefit from skilled PT to work on strength and functional activities to maximize her safety and participation in daily tasks.   OBJECTIVE IMPAIRMENTS: decreased balance, decreased mobility, difficulty walking, decreased strength, increased muscle spasms, impaired flexibility, and pain.   ACTIVITY LIMITATIONS: lifting, bending, standing, squatting, sleeping, and stairs  PARTICIPATION LIMITATIONS: cleaning, laundry, driving, community activity, and church  PERSONAL FACTORS: Past/current experiences and 3+ comorbidities: osteopenia, Hx of left ankle Fx, s/p R THA on 08/25/2023  are also affecting patient's functional outcome.   REHAB POTENTIAL: Good  CLINICAL DECISION MAKING: Evolving/moderate complexity  EVALUATION COMPLEXITY: Moderate   GOALS: Goals reviewed with patient? Yes  SHORT TERM GOALS: Target date: 10/10/2023 Patient will be independent with initial HEP. Baseline: Goal status: Met  2.  Patient will participate in 3 or 6 minute walk test to establish a baseline. Baseline:  Goal status: Met  LONG TERM GOALS: Target date: 11/14/2023  Patient will be independent with advanced HEP to allow for self progression after discharge. Baseline:  Goal status: Ongoing 3/21  2.  Patient will increase right hip strength to [facility] to allow her to navigate stairs in her home with reciprocal pattern. Baseline:  Goal status: Ongoing 3/21  3.  Patient will be able to walk for at least 15 minutes with LRAD to allow for return to community ambulation. Baseline:  Goal status: Ongoing 3/21  4.  Patient will improve Lower Extremity Functional Scale to at least 50% to demonstrate  improved functional mobility. Baseline: 37.5% Goal status: ongoing   5.  Patient will report ability to resume aquatic aerobics class and going on outings with her friends without increased pain. Baseline:  Goal status: ongoing - went twice last week and modified 3/21   PLAN:  PT FREQUENCY: 2x/week  PT DURATION: 8 weeks  PLANNED INTERVENTIONS: 97164- PT Re-evaluation, 97110-Therapeutic exercises, 97530- Therapeutic activity, 97112- Neuromuscular re-education, 97535- Self Care, 40102- Manual therapy, [phone]- Gait training, [phone]- Aquatic Therapy, 97014- Electrical stimulation (unattended), [phone]- Electrical stimulation (manual), 97016- Vasopneumatic device, [id number]- Ultrasound, [id number]- Traction (mechanical), [id number]- Ionotophoresis 4mg /ml Dexamethasone, Patient/Family education, Balance training, Stair training, Taping, Dry Needling, Joint mobilization, Joint manipulation, Spinal manipulation, Spinal mobilization, Scar mobilization, Cryotherapy, and Moist heat  PLAN FOR NEXT SESSION: remove progress note at top of note next time, work on glut strength, continue leg press, step ups/downs with 2-4" step, NuStep, walking endurance and confidence, Assess and progress HEP as indicated, strengthening, flexibility, manual/dry needling as indicated, aquatic PT  Morton Peters, PT 11/04/23 6:46 PM      [facility] [address] [place], [place] [address] Phone # [phone] Fax [phone]

## 2023-11-05 ENCOUNTER — Encounter: Payer: Self-pay | Admitting: Physical Therapy

## 2023-11-05 ENCOUNTER — Ambulatory Visit: Payer: Medicare Other | Admitting: Physical Therapy

## 2023-11-05 DIAGNOSIS — R293 Abnormal posture: Secondary | ICD-10-CM

## 2023-11-05 DIAGNOSIS — M545 Low back pain, unspecified: Secondary | ICD-10-CM

## 2023-11-05 DIAGNOSIS — M25551 Pain in right hip: Secondary | ICD-10-CM

## 2023-11-05 DIAGNOSIS — R262 Difficulty in walking, not elsewhere classified: Secondary | ICD-10-CM | POA: Diagnosis not present

## 2023-11-05 DIAGNOSIS — M6281 Muscle weakness (generalized): Secondary | ICD-10-CM

## 2023-11-05 DIAGNOSIS — R252 Cramp and spasm: Secondary | ICD-10-CM

## 2023-11-07 ENCOUNTER — Ambulatory Visit: Payer: Medicare Other | Admitting: Physical Therapy

## 2023-11-07 ENCOUNTER — Encounter: Payer: Self-pay | Admitting: Physical Therapy

## 2023-11-07 DIAGNOSIS — M25551 Pain in right hip: Secondary | ICD-10-CM

## 2023-11-07 DIAGNOSIS — R252 Cramp and spasm: Secondary | ICD-10-CM

## 2023-11-07 DIAGNOSIS — R262 Difficulty in walking, not elsewhere classified: Secondary | ICD-10-CM | POA: Diagnosis not present

## 2023-11-07 DIAGNOSIS — M6281 Muscle weakness (generalized): Secondary | ICD-10-CM

## 2023-11-07 NOTE — Therapy (Signed)
 OUTPATIENT PHYSICAL THERAPY TREATMENT NOTE    Patient Name: Linda [name] MRN: [id number] DOB:1941/07/07, 83 y.o., female Today's Date: 11/07/2023  END OF SESSION:  PT End of Session - 11/07/23 1059     Visit Number 12    Date for PT Re-Evaluation 12/12/23    Authorization Type BC/BS Medicare    Progress Note Due on Visit 20    PT Start Time 1100    PT Stop Time 1147    PT Time Calculation (min) 47 min    Activity Tolerance Patient tolerated treatment well    Behavior During Therapy WFL for tasks assessed/performed                    Past Medical History:  Diagnosis Date   ALLERGIC RHINITIS    Allergy    ANEMIA-IRON DEFICIENCY    Anxiety state, unspecified    Carpal tunnel syndrome    pt denies    Cataract    Closed left ankle fracture    COLONIC POLYPS, HX OF 2007   GERD    GLAUCOMA    Glaucoma    GOITER, MULTINODULAR    on [place] 2006, unchanged 6/13 with nodules all <89mm   HIATAL HERNIA WITH REFLUX    Hip dysplasia, acquired    B sx; eval at [facility] for same 01/2015 -ongoing PT   OSTEOARTHRITIS    Osteoporosis    Serrated polyp of colon    TOBACCO USE, QUIT    Past Surgical History:  Procedure Laterality Date   BREAST SURGERY  1960   Left breast cyst removed no complications   CARPAL TUNNEL RELEASE Right    COLONOSCOPY  2005   EYE SURGERY  1991-1992   both eyes   EYE SURGERY Left 2014   shunt behind left eye   ORIF FIBULA FRACTURE Left 08/24/2019   Procedure: OPEN REDUCTION INTERNAL FIXATION (ORIF) LEFT DISTAL FIBULA FRACTURE;  Surgeon: Teryl Lucy, MD;  Location: [facility];  Service: Orthopedics;  Laterality: Left;   Right shoulder aurgery  10/2008   TOTAL HIP ARTHROPLASTY Right 08/25/2023   Procedure: RIGHT TOTAL HIP REPLACEMENT;  Surgeon: Tarry Kos, MD;  Location: [facility] OR;  Service: Orthopedics;  Laterality: Right;  3-C   Patient Active Problem List   Diagnosis Date Noted   Status post total replacement of right hip  08/25/2023   Degenerative joint disease of right hip 08/24/2023   Spinal stenosis of lumbar region 10/09/2022   Facet arthritis of lumbar region 10/09/2022   Chronic bilateral low back pain without sciatica 10/04/2022   Osteopenia 11/26/2021   Pre-diabetes 11/26/2021   Other fatigue 07/28/2020   Dupuytren's disease of palm 01/19/2018   Viral URI 01/23/2017   Bilateral sensorineural hearing loss 03/12/2016   Routine general medical examination at a health care facility 07/22/2015   Stress fracture of neck of right femur 01/25/2015   Anemia 11/06/2012   Anxiety 11/06/2012   Depression 11/06/2012   Cataract, nuclear 10/28/2012   Primary open angle glaucoma of both eyes, indeterminate stage 10/28/2012   Ptosis of eyelid 10/28/2012   Bladder prolapse, female, acquired 01/09/2012   GERD (gastroesophageal reflux disease) 05/21/2007    PCP: Myrlene Broker, MD  REFERRING PROVIDER: Cristie Hem, PA-C  REFERRING DIAG: [phone] (ICD-10-CM) - Status post total replacement of right hip  THERAPY DIAG:  Difficulty in walking, not elsewhere classified  Muscle weakness (generalized)  Pain in right hip  Cramp and spasm  Rationale  for Evaluation and Treatment: Rehabilitation  ONSET DATE: S/P Right THA on 08/25/2023  SUBJECTIVE:   SUBJECTIVE STATEMENT: I can tell my hip is a little bit better.  PERTINENT HISTORY: Hx of back pain, R THA on 08/25/23, OA, Hx of Left ankle fracture in 2021, osteopenia  PAIN:  Are you having pain? Not right this minute  PRECAUTIONS: None  RED FLAGS: None   WEIGHT BEARING RESTRICTIONS: No  FALLS:  Has patient fallen in last 6 months? Yes. Number of falls 1 fall before surgery when she missed a step and fell at her daughter's home  LIVING ENVIRONMENT: Lives with: lives alone Lives in: House/apartment Stairs:  two story home Has following equipment at home: Single point cane, Environmental consultant - 2 wheeled, shower chair, and bed side  commode  OCCUPATION: Retired  PLOF: Independent and Vocation/Vocational requirements: water aerobics, church activities  PATIENT GOALS: To be independent again and return to driving and community activities.  NEXT MD VISIT: Osteoporosis Clinic with [place] Persons, PA on 09/22/23  OBJECTIVE:  Note: Objective measures were completed at Evaluation unless otherwise noted.  DIAGNOSTIC FINDINGS:  Pelvic Radiograph on 08/25/2023: IMPRESSION: Right hip arthroplasty without immediate postoperative complication.  PATIENT SURVEYS:  10/31/23: LEFS  /80 =  Eval:  LEFS 30 / 80 = 37.5 %  COGNITION: Overall cognitive status: Within functional limits for tasks assessed     SENSATION: Patient states some numbness at incision site.  MUSCLE LENGTH: Hamstrings: Right tightness noted  POSTURE: rounded shoulders and forward head  PALPATION: Tender to palpation along incision site  LOWER EXTREMITY ROM:  WFL  LOWER EXTREMITY MMT: 10/31/23: Rt hip extension 4/5 Rt hip flexion 4/5 with anterior hip pain Rt hip abduction 4/5 Rt Knee 5/5   Eval:   Right hip strength grossly 4 to 4+/5 Right quad and hamstring strength of 5-/5 Left LE strength is WFL   FUNCTIONAL TESTS:  Eval: 5 times sit to stand: 21.66 with required use of UE pushing chair handles Timed up and go (TUG): 20.66 sec with RW  09/26/23:  3 minute walk: covered 61' with gait belt and no AD  10/17/2023: 3 minute walk:  464 ft with [employer]  10/31/2023: 3 minute walk:  445 ft with SPC 5 X STS: 14.89 with light use of hands on thighs TUG: 15.90 with SPC, 15.10  GAIT: Distance walked: >200 ft Assistive device utilized: Environmental consultant - 2 wheeled Level of assistance: Modified independence Comments: Pt with antalgic gait pattern                                                                                                                                TODAY'S TREATMENT  11/07/23: 2 min walk around both gyms to loosen up Rt  hip Fig 4 stretch edge of mat table bil 30" Seated lumbar flexion ball rollouts 3x10" P/ROM Rt hip: IR, ER, flexion, abduction Supine bridge series each: basic bridge 8x5", bridge with  hip abd yellow loop x5, bridge with ball squeeze x5 Rt hip flexor stretch with leg off side of mat table 2x30" Standing heel raises, hip abd, march and ext bil x10 each Lateral step up 2" Rt with UE support 1x10 Fwd step up 2" Rt with single UE support 1x10 Standing L stretch for lumbar stretch 20" Step downs 2" x10 Rt LE on step NuStep L1 x5' cooldown while updating HEP Massage gun assisted STM Rt anterior and medial hip  11/05/23:Pt arrives for aquatic physical therapy. Treatment took place in 3.5-5.5 feet of water. Water temperature was 91 degrees F Pt entered the pool via stairs reciprocally but slow. Seated water bench with 75% submersion Pt performed seated LE AROM exercises 20x in all planes, concurrent discussion of her current status.Pt requires buoyancy of water for support and to offload joints with strengthening exercises.   75% depth water walking with UE push/pull with running arms 10x each direction. High knee marching holding the small noodle 4x. Bil hip kicks 3 ways 15x each holding on for balance.Step ups forward 10x, sideways 10x.Marland Kitchen Heel lifts 15x. Underwater bicycle on horseback 8 min.   10/31/23: NuStep L5 x 5' PT present to discuss status and plan for session Rt hip flexor stretch standing with bil UE support on barre 2x30" LE warm up at barre: 6x alt LE march, hip ext, hip abd, heel raise , TUG, 5x STS and MMT Sit to stand from airex pad on mat table with lean and UE reach to stand x10 Sit to stand from mat table without airex pad x5 with lean and UE reach Seated hip abd yellow loop x 20 (to address Lt knee valgus on sit to stand) Supine bridge with yellow loop at knees x10 Leg press 55lb 2x10 - first set 50/50 effort between legs, 2nd set 75/25 effort Rt/Lt  10/29/23:Pt arrives for  aquatic physical therapy. Treatment took place in 3.5-5.5 feet of water. Water temperature was 92 degrees F Pt entered the pool via stairs reciprocally but slow. Seated water bench with 75% submersion Pt performed seated LE AROM exercises 20x in all planes, concurrent discussion of her current status.Pt requires buoyancy of water for support and to offload joints with strengthening exercises.   75% depth water walking with UE push/pull with running arms 10x each direction. Standing marching holding the small noodle 10x. Bil hip kicks 3 ways 10x each holding on for balance.Step ups forward 10x, sideways 2x5. Soft tissue work to Rt anterior hip. Heel lifts 15x. Underwater bicycle on horseback 8 min.  PATIENT EDUCATION:  Education details: Issued HEP Person educated: Patient Education method: Explanation, Facilities manager, and Handouts Education comprehension: verbalized understanding and returned demonstration  HOME EXERCISE PROGRAM: Access Code: [email or web address] URL: [email or web address] Date: 11/07/2023 Prepared by: Morton Peters  Exercises - Standing March with Counter Support  - 1 x daily - 7 x weekly - 2 sets - 10 reps - Standing Hip Abduction with Counter Support  - 1 x daily - 7 x weekly - 2 sets - 10 reps - Standing Hip Extension with Counter Support  - 1 x daily - 7 x weekly - 2 sets - 10 reps - Mini Squat with Counter Support  - 1 x daily - 7 x weekly - 2 sets - 10 reps - Heel Raises with Counter Support  - 1 x daily - 7 x weekly - 2 sets - 10 reps - Side Stepping with Counter Support  - 1 x daily - 7 x weekly -  3 sets - 10 reps - Supine Hip Adduction Isometric with Ball  - 1 x daily - 7 x weekly - 1 sets - 10 reps - 5 hold - Supine Bridge with Spinal Articulation  - 1 x daily - 7 x weekly - 1 sets - 10 reps - Sit to Stand  - 3 x daily - 7 x weekly - 1 sets - 10 reps - Lateral Step Up  - 1 x daily - 7 x weekly - 1 sets - 10 reps - Forward Step Up  - 1 x daily - 7 x weekly -  1 sets - 10 reps - Forward Step Down  - 1 x daily - 7 x weekly - 1 sets - 10 reps  ASSESSMENT:  CLINICAL IMPRESSION:  Pt continues to display significant weakness in Rt hip, especially in gluteals.  She reaches fatigue with step ups after 10 reps with 2" step.  Progressed bridge series and step ups for more targeted closed chain glut strength to work toward more ind with sit to stand and stairs.  Gave step ups (3-way) for HEP if Pt can find a 2" hardback book or equivalent to work these at home.   OBJECTIVE IMPAIRMENTS: decreased balance, decreased mobility, difficulty walking, decreased strength, increased muscle spasms, impaired flexibility, and pain.   ACTIVITY LIMITATIONS: lifting, bending, standing, squatting, sleeping, and stairs  PARTICIPATION LIMITATIONS: cleaning, laundry, driving, community activity, and church  PERSONAL FACTORS: Past/current experiences and 3+ comorbidities: osteopenia, Hx of left ankle Fx, s/p R THA on 08/25/2023  are also affecting patient's functional outcome.   REHAB POTENTIAL: Good  CLINICAL DECISION MAKING: Evolving/moderate complexity  EVALUATION COMPLEXITY: Moderate   GOALS: Goals reviewed with patient? Yes  SHORT TERM GOALS: Target date: 10/10/2023 Patient will be independent with initial HEP. Baseline: Goal status: Met  2.  Patient will participate in 3 or 6 minute walk test to establish a baseline. Baseline:  Goal status: Met  LONG TERM GOALS: Target date: 11/14/2023  Patient will be independent with advanced HEP to allow for self progression after discharge. Baseline:  Goal status: Ongoing 3/21  2.  Patient will increase right hip strength to [facility] to allow her to navigate stairs in her home with reciprocal pattern. Baseline:  Goal status: Ongoing 3/21  3.  Patient will be able to walk for at least 15 minutes with LRAD to allow for return to community ambulation. Baseline:  Goal status: Ongoing 3/21  4.  Patient will improve Lower  Extremity Functional Scale to at least 50% to demonstrate improved functional mobility. Baseline: 37.5% Goal status: ongoing   5.  Patient will report ability to resume aquatic aerobics class and going on outings with her friends without increased pain. Baseline:  Goal status: ongoing - went twice last week and modified 3/21   PLAN:  PT FREQUENCY: 2x/week  PT DURATION: 8 weeks  PLANNED INTERVENTIONS: 97164- PT Re-evaluation, 97110-Therapeutic exercises, 97530- Therapeutic activity, 97112- Neuromuscular re-education, 97535- Self Care, [address]- Manual therapy, [id number]- Gait training, [id number]- Aquatic Therapy, 97014- Electrical stimulation (unattended), [phone]- Electrical stimulation (manual), [id number]- Vasopneumatic device, [id number]- Ultrasound, [id number]- Traction (mechanical), [id number]- Ionotophoresis 4mg /ml Dexamethasone, Patient/Family education, Balance training, Stair training, Taping, Dry Needling, Joint mobilization, Joint manipulation, Spinal manipulation, Spinal mobilization, Scar mobilization, Cryotherapy, and Moist heat  PLAN FOR NEXT SESSION:work on glut strength, continue leg press, step ups/downs with 2-4" step, NuStep, walking endurance and confidence, Assess and progress HEP as indicated, strengthening, flexibility, manual/dry needling as indicated, aquatic PT  [address]  Hailley Byers, PT 11/07/23 11:53 AM     [facility] [address], [address] [place], [place] [address] Phone # [phone] Fax [phone]

## 2023-11-12 ENCOUNTER — Ambulatory Visit: Payer: Medicare Other | Attending: Physician Assistant | Admitting: Physical Therapy

## 2023-11-12 ENCOUNTER — Encounter: Payer: Self-pay | Admitting: Physical Therapy

## 2023-11-12 DIAGNOSIS — M25551 Pain in right hip: Secondary | ICD-10-CM | POA: Insufficient documentation

## 2023-11-12 DIAGNOSIS — R262 Difficulty in walking, not elsewhere classified: Secondary | ICD-10-CM | POA: Diagnosis present

## 2023-11-12 DIAGNOSIS — M6281 Muscle weakness (generalized): Secondary | ICD-10-CM | POA: Insufficient documentation

## 2023-11-12 DIAGNOSIS — R252 Cramp and spasm: Secondary | ICD-10-CM | POA: Diagnosis present

## 2023-11-12 DIAGNOSIS — G8929 Other chronic pain: Secondary | ICD-10-CM | POA: Diagnosis present

## 2023-11-12 DIAGNOSIS — M545 Low back pain, unspecified: Secondary | ICD-10-CM | POA: Diagnosis present

## 2023-11-12 DIAGNOSIS — R293 Abnormal posture: Secondary | ICD-10-CM | POA: Insufficient documentation

## 2023-11-12 NOTE — Therapy (Signed)
 OUTPATIENT PHYSICAL THERAPY TREATMENT NOTE    Patient Name: Linda [name] MRN: [id number] DOB:1941-03-10, 83 y.o., female Today's Date: 11/12/2023  END OF SESSION:  PT End of Session - 11/12/23 1139     Visit Number 13    Date for PT Re-Evaluation 12/12/23    Authorization Type BC/BS Medicare    Progress Note Due on Visit 20    PT Start Time 1100    PT Stop Time 1140    PT Time Calculation (min) 40 min    Activity Tolerance Patient tolerated treatment well    Behavior During Therapy WFL for tasks assessed/performed                    Past Medical History:  Diagnosis Date   ALLERGIC RHINITIS    Allergy    ANEMIA-IRON DEFICIENCY    Anxiety state, unspecified    Carpal tunnel syndrome    pt denies    Cataract    Closed left ankle fracture    COLONIC POLYPS, HX OF 2007   GERD    GLAUCOMA    Glaucoma    GOITER, MULTINODULAR    on [place] 2006, unchanged 6/13 with nodules all <54mm   HIATAL HERNIA WITH REFLUX    Hip dysplasia, acquired    B sx; eval at [facility] for same 01/2015 -ongoing PT   OSTEOARTHRITIS    Osteoporosis    Serrated polyp of colon    TOBACCO USE, QUIT    Past Surgical History:  Procedure Laterality Date   BREAST SURGERY  1960   Left breast cyst removed no complications   CARPAL TUNNEL RELEASE Right    COLONOSCOPY  2005   EYE SURGERY  1991-1992   both eyes   EYE SURGERY Left 2014   shunt behind left eye   ORIF FIBULA FRACTURE Left 08/24/2019   Procedure: OPEN REDUCTION INTERNAL FIXATION (ORIF) LEFT DISTAL FIBULA FRACTURE;  Surgeon: Teryl Lucy, MD;  Location: [facility];  Service: Orthopedics;  Laterality: Left;   Right shoulder aurgery  10/2008   TOTAL HIP ARTHROPLASTY Right 08/25/2023   Procedure: RIGHT TOTAL HIP REPLACEMENT;  Surgeon: Tarry Kos, MD;  Location: [facility] OR;  Service: Orthopedics;  Laterality: Right;  3-C   Patient Active Problem List   Diagnosis Date Noted   Status post total replacement of right hip  08/25/2023   Degenerative joint disease of right hip 08/24/2023   Spinal stenosis of lumbar region 10/09/2022   Facet arthritis of lumbar region 10/09/2022   Chronic bilateral low back pain without sciatica 10/04/2022   Osteopenia 11/26/2021   Pre-diabetes 11/26/2021   Other fatigue 07/28/2020   Dupuytren's disease of palm 01/19/2018   Viral URI 01/23/2017   Bilateral sensorineural hearing loss 03/12/2016   Routine general medical examination at a health care facility 07/22/2015   Stress fracture of neck of right femur 01/25/2015   Anemia 11/06/2012   Anxiety 11/06/2012   Depression 11/06/2012   Cataract, nuclear 10/28/2012   Primary open angle glaucoma of both eyes, indeterminate stage 10/28/2012   Ptosis of eyelid 10/28/2012   Bladder prolapse, female, acquired 01/09/2012   GERD (gastroesophageal reflux disease) 05/21/2007    PCP: Myrlene Broker, MD  REFERRING PROVIDER: Cristie Hem, PA-C  REFERRING DIAG: [id number] (ICD-10-CM) - Status post total replacement of right hip  THERAPY DIAG:  Difficulty in walking, not elsewhere classified  Muscle weakness (generalized)  Pain in right hip  Cramp and spasm  Abnormal  posture  Chronic bilateral low back pain without sciatica  Rationale for Evaluation and Treatment: Rehabilitation  ONSET DATE: S/P Right THA on 08/25/2023  SUBJECTIVE:   SUBJECTIVE STATEMENT: I do believe my walking is better.  PERTINENT HISTORY: Hx of back pain, R THA on 08/25/23, OA, Hx of Left ankle fracture in 2021, osteopenia  PAIN:  Are you having pain? Not right this minute  PRECAUTIONS: None  RED FLAGS: None   WEIGHT BEARING RESTRICTIONS: No  FALLS:  Has patient fallen in last 6 months? Yes. Number of falls 1 fall before surgery when she missed a step and fell at her daughter's home  LIVING ENVIRONMENT: Lives with: lives alone Lives in: House/apartment Stairs:  two story home Has following equipment at home: Single point  cane, Environmental consultant - 2 wheeled, shower chair, and bed side commode  OCCUPATION: Retired  PLOF: Independent and Vocation/Vocational requirements: water aerobics, church activities  PATIENT GOALS: To be independent again and return to driving and community activities.  NEXT MD VISIT: Osteoporosis Clinic with [place] Persons, PA on 09/22/23  OBJECTIVE:  Note: Objective measures were completed at Evaluation unless otherwise noted.  DIAGNOSTIC FINDINGS:  Pelvic Radiograph on 08/25/2023: IMPRESSION: Right hip arthroplasty without immediate postoperative complication.  PATIENT SURVEYS:  10/31/23: LEFS  /80 =  Eval:  LEFS 30 / 80 = 37.5 %  COGNITION: Overall cognitive status: Within functional limits for tasks assessed     SENSATION: Patient states some numbness at incision site.  MUSCLE LENGTH: Hamstrings: Right tightness noted  POSTURE: rounded shoulders and forward head  PALPATION: Tender to palpation along incision site  LOWER EXTREMITY ROM:  WFL  LOWER EXTREMITY MMT: 10/31/23: Rt hip extension 4/5 Rt hip flexion 4/5 with anterior hip pain Rt hip abduction 4/5 Rt Knee 5/5   Eval:   Right hip strength grossly 4 to 4+/5 Right quad and hamstring strength of 5-/5 Left LE strength is WFL   FUNCTIONAL TESTS:  Eval: 5 times sit to stand: 21.66 with required use of UE pushing chair handles Timed up and go (TUG): 20.66 sec with RW  09/26/23:  3 minute walk: covered 26' with gait belt and no AD  10/17/2023: 3 minute walk:  464 ft with [facility]  10/31/2023: 3 minute walk:  445 ft with SPC 5 X STS: 14.89 with light use of hands on thighs TUG: 15.90 with SPC, 15.10  GAIT: Distance walked: >200 ft Assistive device utilized: Walker - 2 wheeled Level of assistance: Modified independence Comments: Pt with antalgic gait pattern                                                                                                                                TODAY'S TREATMENT    11/12/23:Pt arrives for aquatic physical therapy. Treatment took place in 3.5-5.5 feet of water. Water temperature was 91 degrees F Pt entered the pool via stairs reciprocally but slow. Seated water bench with 75%  submersion Pt performed seated LE AROM exercises 20x in all planes, concurrent discussion of her current status.Pt requires buoyancy of water for support and to offload joints with strengthening exercises.   75% depth water walking with UE push/pull with running arms 10x each direction. High knee marching holding the small noodle 6x. Bil hip kicks 3 ways 15x each holding on for balance.Step ups forward 1022x, sideways 10x.Marland Kitchen Heel lifts 15x. Underwater bicycle on horseback 8 min.   11/07/23: 2 min walk around both gyms to loosen up Rt hip Fig 4 stretch edge of mat table bil 30" Seated lumbar flexion ball rollouts 3x10" P/ROM Rt hip: IR, ER, flexion, abduction Supine bridge series each: basic bridge 8x5", bridge with hip abd yellow loop x5, bridge with ball squeeze x5 Rt hip flexor stretch with leg off side of mat table 2x30" Standing heel raises, hip abd, march and ext bil x10 each Lateral step up 2" Rt with UE support 1x10 Fwd step up 2" Rt with single UE support 1x10 Standing L stretch for lumbar stretch 20" Step downs 2" x10 Rt LE on step NuStep L1 x5' cooldown while updating HEP Massage gun assisted STM Rt anterior and medial hip  11/05/23:Pt arrives for aquatic physical therapy. Treatment took place in 3.5-5.5 feet of water. Water temperature was 91 degrees F Pt entered the pool via stairs reciprocally but slow. Seated water bench with 75% submersion Pt performed seated LE AROM exercises 20x in all planes, concurrent discussion of her current status.Pt requires buoyancy of water for support and to offload joints with strengthening exercises.   75% depth water walking with UE push/pull with running arms 10x each direction. High knee marching holding the small noodle 4x. Bil hip  kicks 3 ways 15x each holding on for balance.Step ups forward 10x, sideways 10x.Marland Kitchen Heel lifts 15x. Underwater bicycle on horseback 8 min.   PATIENT EDUCATION:  Education details: Issued HEP Person educated: Patient Education method: Explanation, Facilities manager, and Handouts Education comprehension: verbalized understanding and returned demonstration  HOME EXERCISE PROGRAM: Access Code: [email or web address] URL: [email or web address] Date: 11/07/2023 Prepared by: Morton Peters  Exercises - Standing March with Counter Support  - 1 x daily - 7 x weekly - 2 sets - 10 reps - Standing Hip Abduction with Counter Support  - 1 x daily - 7 x weekly - 2 sets - 10 reps - Standing Hip Extension with Counter Support  - 1 x daily - 7 x weekly - 2 sets - 10 reps - Mini Squat with Counter Support  - 1 x daily - 7 x weekly - 2 sets - 10 reps - Heel Raises with Counter Support  - 1 x daily - 7 x weekly - 2 sets - 10 reps - Side Stepping with Counter Support  - 1 x daily - 7 x weekly - 3 sets - 10 reps - Supine Hip Adduction Isometric with Ball  - 1 x daily - 7 x weekly - 1 sets - 10 reps - 5 hold - Supine Bridge with Spinal Articulation  - 1 x daily - 7 x weekly - 1 sets - 10 reps - Sit to Stand  - 3 x daily - 7 x weekly - 1 sets - 10 reps - Lateral Step Up  - 1 x daily - 7 x weekly - 1 sets - 10 reps - Forward Step Up  - 1 x daily - 7 x weekly - 1 sets - 10 reps - Forward Step Down  - 1  x daily - 7 x weekly - 1 sets - 10 reps  ASSESSMENT:  CLINICAL IMPRESSION: Pt enters pool area with much improved gait. No pain today. She was able to add a second set of her step ups today with a significant improvement in her control. Pt will see her surgeon next week.   OBJECTIVE IMPAIRMENTS: decreased balance, decreased mobility, difficulty walking, decreased strength, increased muscle spasms, impaired flexibility, and pain.   ACTIVITY LIMITATIONS: lifting, bending, standing, squatting, sleeping, and  stairs  PARTICIPATION LIMITATIONS: cleaning, laundry, driving, community activity, and church  PERSONAL FACTORS: Past/current experiences and 3+ comorbidities: osteopenia, Hx of left ankle Fx, s/p R THA on 08/25/2023  are also affecting patient's functional outcome.   REHAB POTENTIAL: Good  CLINICAL DECISION MAKING: Evolving/moderate complexity  EVALUATION COMPLEXITY: Moderate   GOALS: Goals reviewed with patient? Yes  SHORT TERM GOALS: Target date: 10/10/2023 Patient will be independent with initial HEP. Baseline: Goal status: Met  2.  Patient will participate in 3 or 6 minute walk test to establish a baseline. Baseline:  Goal status: Met  LONG TERM GOALS: Target date: 11/14/2023  Patient will be independent with advanced HEP to allow for self progression after discharge. Baseline:  Goal status: Ongoing 3/21  2.  Patient will increase right hip strength to [facility] to allow her to navigate stairs in her home with reciprocal pattern. Baseline:  Goal status: Ongoing 3/21  3.  Patient will be able to walk for at least 15 minutes with LRAD to allow for return to community ambulation. Baseline:  Goal status: Ongoing 3/21  4.  Patient will improve Lower Extremity Functional Scale to at least 50% to demonstrate improved functional mobility. Baseline: 37.5% Goal status: ongoing   5.  Patient will report ability to resume aquatic aerobics class and going on outings with her friends without increased pain. Baseline:  Goal status: ongoing - went twice last week and modified 3/21   PLAN:  PT FREQUENCY: 2x/week  PT DURATION: 8 weeks  PLANNED INTERVENTIONS: 97164- PT Re-evaluation, 97110-Therapeutic exercises, 97530- Therapeutic activity, 97112- Neuromuscular re-education, 97535- Self Care, 86578- Manual therapy, [phone]- Gait training, [phone]- Aquatic Therapy, 97014- Electrical stimulation (unattended), [phone]- Electrical stimulation (manual), 97016- Vasopneumatic device, [id number]-  Ultrasound, [id number]- Traction (mechanical), [id number]- Ionotophoresis 4mg /ml Dexamethasone, Patient/Family education, Balance training, Stair training, Taping, Dry Needling, Joint mobilization, Joint manipulation, Spinal manipulation, Spinal mobilization, Scar mobilization, Cryotherapy, and Moist heat  PLAN FOR NEXT SESSION:work on glut strength, continue leg press, step ups/downs with 2-4" step, NuStep, walking endurance and confidence, Assess and progress HEP as indicated, strengthening, flexibility, manual/dry needling as indicated, aquatic PT  Ane Payment, PTA 11/12/23 11:40 AM    [facility] [address] [place], [place] [address] Phone # [phone] Fax [phone]

## 2023-11-14 ENCOUNTER — Telehealth: Payer: Self-pay

## 2023-11-14 ENCOUNTER — Ambulatory Visit: Payer: Medicare Other | Admitting: Rehabilitative and Restorative Service Providers"

## 2023-11-14 ENCOUNTER — Encounter: Payer: Self-pay | Admitting: Rehabilitative and Restorative Service Providers"

## 2023-11-14 DIAGNOSIS — R252 Cramp and spasm: Secondary | ICD-10-CM

## 2023-11-14 DIAGNOSIS — M6281 Muscle weakness (generalized): Secondary | ICD-10-CM

## 2023-11-14 DIAGNOSIS — M25551 Pain in right hip: Secondary | ICD-10-CM

## 2023-11-14 DIAGNOSIS — R262 Difficulty in walking, not elsewhere classified: Secondary | ICD-10-CM | POA: Diagnosis not present

## 2023-11-14 NOTE — Telephone Encounter (Signed)
Patient returning Tori's call.

## 2023-11-14 NOTE — Telephone Encounter (Signed)
 While training, Tori Called and left a VM for patient to CB to discuss pricing for [employer] and [employer].

## 2023-11-14 NOTE — Therapy (Signed)
 OUTPATIENT PHYSICAL THERAPY TREATMENT NOTE    Patient Name: Linda [name] MRN: [id number] DOB:October 20, 1940, 83 y.o., female Today's Date: 11/14/2023  END OF SESSION:  PT End of Session - 11/14/23 1116     Visit Number 14    Date for PT Re-Evaluation 12/12/23    Authorization Type BC/BS Medicare    Progress Note Due on Visit 20    PT Start Time 1100    PT Stop Time 1140    PT Time Calculation (min) 40 min    Activity Tolerance Patient tolerated treatment well    Behavior During Therapy WFL for tasks assessed/performed                    Past Medical History:  Diagnosis Date   ALLERGIC RHINITIS    Allergy    ANEMIA-IRON DEFICIENCY    Anxiety state, unspecified    Carpal tunnel syndrome    pt denies    Cataract    Closed left ankle fracture    COLONIC POLYPS, HX OF 2007   GERD    GLAUCOMA    Glaucoma    GOITER, MULTINODULAR    on [place] 2006, unchanged 6/13 with nodules all <9mm   HIATAL HERNIA WITH REFLUX    Hip dysplasia, acquired    B sx; eval at [facility] for same 01/2015 -ongoing PT   OSTEOARTHRITIS    Osteoporosis    Serrated polyp of colon    TOBACCO USE, QUIT    Past Surgical History:  Procedure Laterality Date   BREAST SURGERY  1960   Left breast cyst removed no complications   CARPAL TUNNEL RELEASE Right    COLONOSCOPY  2005   EYE SURGERY  1991-1992   both eyes   EYE SURGERY Left 2014   shunt behind left eye   ORIF FIBULA FRACTURE Left 08/24/2019   Procedure: OPEN REDUCTION INTERNAL FIXATION (ORIF) LEFT DISTAL FIBULA FRACTURE;  Surgeon: Teryl Lucy, MD;  Location: [facility];  Service: Orthopedics;  Laterality: Left;   Right shoulder aurgery  10/2008   TOTAL HIP ARTHROPLASTY Right 08/25/2023   Procedure: RIGHT TOTAL HIP REPLACEMENT;  Surgeon: Tarry Kos, MD;  Location: [facility];  Service: Orthopedics;  Laterality: Right;  3-C   Patient Active Problem List   Diagnosis Date Noted   Status post total replacement of right hip  08/25/2023   Degenerative joint disease of right hip 08/24/2023   Spinal stenosis of lumbar region 10/09/2022   Facet arthritis of lumbar region 10/09/2022   Chronic bilateral low back pain without sciatica 10/04/2022   Osteopenia 11/26/2021   Pre-diabetes 11/26/2021   Other fatigue 07/28/2020   Dupuytren's disease of palm 01/19/2018   Viral URI 01/23/2017   Bilateral sensorineural hearing loss 03/12/2016   Routine general medical examination at a health care facility 07/22/2015   Stress fracture of neck of right femur 01/25/2015   Anemia 11/06/2012   Anxiety 11/06/2012   Depression 11/06/2012   Cataract, nuclear 10/28/2012   Primary open angle glaucoma of both eyes, indeterminate stage 10/28/2012   Ptosis of eyelid 10/28/2012   Bladder prolapse, female, acquired 01/09/2012   GERD (gastroesophageal reflux disease) 05/21/2007    PCP: Myrlene Broker, MD  REFERRING PROVIDER: Cristie Hem, PA-C  REFERRING DIAG: [id number] (ICD-10-CM) - Status post total replacement of right hip  THERAPY DIAG:  Difficulty in walking, not elsewhere classified  Muscle weakness (generalized)  Pain in right hip  Cramp and spasm  Rationale  for Evaluation and Treatment: Rehabilitation  ONSET DATE: S/P Right THA on 08/25/2023  SUBJECTIVE:   SUBJECTIVE STATEMENT:  Patient reports that she has been busy with her granddaughter.  PERTINENT HISTORY: Hx of back pain, R THA on 08/25/23, OA, Hx of Left ankle fracture in 2021, osteopenia  PAIN:  PAIN:  Are you having pain? Yes NPRS scale: 2-4/10 Pain location: right hip Pain description: intermittent and aching  Aggravating factors: certain movements Relieving factors: rest   PRECAUTIONS: None  RED FLAGS: None   WEIGHT BEARING RESTRICTIONS: No  FALLS:  Has patient fallen in last 6 months? Yes. Number of falls 1 fall before surgery when she missed a step and fell at her daughter's home  LIVING ENVIRONMENT: Lives with: lives  alone Lives in: House/apartment Stairs:  two story home Has following equipment at home: Single point cane, Environmental consultant - 2 wheeled, shower chair, and bed side commode  OCCUPATION: Retired  PLOF: Independent and Vocation/Vocational requirements: water aerobics, church activities  PATIENT GOALS: To be independent again and return to driving and community activities.  NEXT MD VISIT: Osteoporosis Clinic with [place] Persons, PA on 09/22/23  OBJECTIVE:  Note: Objective measures were completed at Evaluation unless otherwise noted.  DIAGNOSTIC FINDINGS:  Pelvic Radiograph on 08/25/2023: IMPRESSION: Right hip arthroplasty without immediate postoperative complication.  PATIENT SURVEYS:  10/31/23: LEFS  /80 =  Eval:  LEFS 30 / 80 = 37.5 %  COGNITION: Overall cognitive status: Within functional limits for tasks assessed     SENSATION: Patient states some numbness at incision site.  MUSCLE LENGTH: Hamstrings: Right tightness noted  POSTURE: rounded shoulders and forward head  PALPATION: Tender to palpation along incision site  LOWER EXTREMITY ROM:  WFL  LOWER EXTREMITY MMT: 10/31/23: Rt hip extension 4/5 Rt hip flexion 4/5 with anterior hip pain Rt hip abduction 4/5 Rt Knee 5/5   Eval:   Right hip strength grossly 4 to 4+/5 Right quad and hamstring strength of 5-/5 Left LE strength is WFL   FUNCTIONAL TESTS:  Eval: 5 times sit to stand: 21.66 with required use of UE pushing chair handles Timed up and go (TUG): 20.66 sec with RW  09/26/23:  3 minute walk: covered 75' with gait belt and no AD  10/17/2023: 3 minute walk:  464 ft with [facility]  10/31/2023: 3 minute walk:  445 ft with SPC 5 X STS: 14.89 with light use of hands on thighs TUG: 15.90 with SPC, 15.10  GAIT: Distance walked: >200 ft Assistive device utilized: Environmental consultant - 2 wheeled Level of assistance: Modified independence Comments: Pt with antalgic gait pattern                                                                                                                                 TODAY'S TREATMENT   11/14/2023: Nustep level 5 x6 min with PT present to discuss status Fig 4 stretch edge of mat table bil 30"  Seated lumbar flexion ball rollouts 3x10" P/ROM Rt hip: IR, ER, flexion, abduction.  Soft tissue mobilization to right thigh/hip region and quad to promote tissue elasticity and decreased pain Supine bridge series each: basic bridge 8x5", bridge with hip abd yellow loop x5, bridge with ball squeeze x5 Supine clamshell with yellow loop 2x10 Rt hip flexor stretch with leg off side of mat table 2x30" Fwd step up 2" Rt with single UE support 1x10 Lateral step up 2" Rt with UE support 1x10   11/12/23:Pt arrives for aquatic physical therapy. Treatment took place in 3.5-5.5 feet of water. Water temperature was 91 degrees F Pt entered the pool via stairs reciprocally but slow. Seated water bench with 75% submersion Pt performed seated LE AROM exercises 20x in all planes, concurrent discussion of her current status.Pt requires buoyancy of water for support and to offload joints with strengthening exercises.   75% depth water walking with UE push/pull with running arms 10x each direction. High knee marching holding the small noodle 6x. Bil hip kicks 3 ways 15x each holding on for balance.Step ups forward 1022x, sideways 10x.Marland Kitchen Heel lifts 15x. Underwater bicycle on horseback 8 min.   11/07/23: 2 min walk around both gyms to loosen up Rt hip Fig 4 stretch edge of mat table bil 30" Seated lumbar flexion ball rollouts 3x10" P/ROM Rt hip: IR, ER, flexion, abduction Supine bridge series each: basic bridge 8x5", bridge with hip abd yellow loop x5, bridge with ball squeeze x5 Rt hip flexor stretch with leg off side of mat table 2x30" Standing heel raises, hip abd, march and ext bil x10 each Lateral step up 2" Rt with UE support 1x10 Fwd step up 2" Rt with single UE support 1x10 Standing L stretch for lumbar  stretch 20" Step downs 2" x10 Rt LE on step NuStep L1 x5' cooldown while updating HEP Massage gun assisted STM Rt anterior and medial hip    PATIENT EDUCATION:  Education details: Issued HEP Person educated: Patient Education method: Explanation, Demonstration, and Handouts Education comprehension: verbalized understanding and returned demonstration  HOME EXERCISE PROGRAM: Access Code: [email or web address] URL: [email or web address] Date: 11/07/2023 Prepared by: Linda [name]  Exercises - Standing March with Counter Support  - 1 x daily - 7 x weekly - 2 sets - 10 reps - Standing Hip Abduction with Counter Support  - 1 x daily - 7 x weekly - 2 sets - 10 reps - Standing Hip Extension with Counter Support  - 1 x daily - 7 x weekly - 2 sets - 10 reps - Mini Squat with Counter Support  - 1 x daily - 7 x weekly - 2 sets - 10 reps - Heel Raises with Counter Support  - 1 x daily - 7 x weekly - 2 sets - 10 reps - Side Stepping with Counter Support  - 1 x daily - 7 x weekly - 3 sets - 10 reps - Supine Hip Adduction Isometric with Ball  - 1 x daily - 7 x weekly - 1 sets - 10 reps - 5 hold - Supine Bridge with Spinal Articulation  - 1 x daily - 7 x weekly - 1 sets - 10 reps - Sit to Stand  - 3 x daily - 7 x weekly - 1 sets - 10 reps - Lateral Step Up  - 1 x daily - 7 x weekly - 1 sets - 10 reps - Forward Step Up  - 1 x daily - 7 x weekly - 1 sets -  10 reps - Forward Step Down  - 1 x daily - 7 x weekly - 1 sets - 10 reps  ASSESSMENT:  CLINICAL IMPRESSION:  Ms Woodfin presents to skilled PT reporting that she is still having tightness in her hip.  Patient with good tolerance to P/ROM and manual therapy to right hip, reporting decreased pain and tightness following.  Provided cuing for hip flexor stretch to allow patient to achieve a greater stretch, and she reported feeling better following the stretch.  Patient with questions on how to obtain a 2 inch block to perform step ups, educated  patient that she could to Home Improvement store and pick up either a small cinder block or a 2x6 cut board for use at home.  She states that she will go to the store to find an object.  Patient continues to progress with right hip strengthening and flexibility during session.   OBJECTIVE IMPAIRMENTS: decreased balance, decreased mobility, difficulty walking, decreased strength, increased muscle spasms, impaired flexibility, and pain.   ACTIVITY LIMITATIONS: lifting, bending, standing, squatting, sleeping, and stairs  PARTICIPATION LIMITATIONS: cleaning, laundry, driving, community activity, and church  PERSONAL FACTORS: Past/current experiences and 3+ comorbidities: osteopenia, Hx of left ankle Fx, s/p R THA on 08/25/2023  are also affecting patient's functional outcome.   REHAB POTENTIAL: Good  CLINICAL DECISION MAKING: Evolving/moderate complexity  EVALUATION COMPLEXITY: Moderate   GOALS: Goals reviewed with patient? Yes  SHORT TERM GOALS: Target date: 10/10/2023 Patient will be independent with initial HEP. Baseline: Goal status: Met  2.  Patient will participate in 3 or 6 minute walk test to establish a baseline. Baseline:  Goal status: Met  LONG TERM GOALS: Target date: 12/12/2023  Patient will be independent with advanced HEP to allow for self progression after discharge. Baseline:  Goal status: Ongoing 3/21  2.  Patient will increase right hip strength to [facility] to allow her to navigate stairs in her home with reciprocal pattern. Baseline:  Goal status: Ongoing 3/21  3.  Patient will be able to walk for at least 15 minutes with LRAD to allow for return to community ambulation. Baseline:  Goal status: Ongoing 3/21  4.  Patient will improve Lower Extremity Functional Scale to at least 50% to demonstrate improved functional mobility. Baseline: 37.5% Goal status: ongoing   5.  Patient will report ability to resume aquatic aerobics class and going on outings with her  friends without increased pain. Baseline:  Goal status: ongoing - went twice last week and modified 3/21   PLAN:  PT FREQUENCY: 2x/week  PT DURATION: 8 weeks  PLANNED INTERVENTIONS: 97164- PT Re-evaluation, 97110-Therapeutic exercises, 97530- Therapeutic activity, 97112- Neuromuscular re-education, 97535- Self Care, 29528- Manual therapy, [phone]- Gait training, [phone]- Aquatic Therapy, 97014- Electrical stimulation (unattended), [phone]- Electrical stimulation (manual), 97016- Vasopneumatic device, [id number]- Ultrasound, [id number]- Traction (mechanical), [id number]- Ionotophoresis 4mg /ml Dexamethasone, Patient/Family education, Balance training, Stair training, Taping, Dry Needling, Joint mobilization, Joint manipulation, Spinal manipulation, Spinal mobilization, Scar mobilization, Cryotherapy, and Moist heat  PLAN FOR NEXT SESSION:work on glut strength, continue leg press, step ups/downs with 2-4" step, NuStep, walking endurance and confidence, Assess and progress HEP as indicated, strengthening, flexibility, manual/dry needling as indicated, aquatic PT   Reather Laurence, PT, DPT 11/14/23, 11:51 AM  [facility] [address] [place], [place] [address] Phone # [phone] Fax [phone]

## 2023-11-18 ENCOUNTER — Encounter: Payer: Self-pay | Admitting: Orthopaedic Surgery

## 2023-11-18 ENCOUNTER — Ambulatory Visit: Payer: Medicare Other | Admitting: Orthopaedic Surgery

## 2023-11-18 DIAGNOSIS — Z96641 Presence of right artificial hip joint: Secondary | ICD-10-CM

## 2023-11-18 NOTE — Progress Notes (Signed)
 Post-Op Visit Note   Patient: Linda [name]           Date of Birth: Jun 05, 1941           MRN: [id number] Visit Date: 11/18/2023 PCP: Myrlene Broker, MD   Assessment & Plan:  Chief Complaint:  Chief Complaint  Patient presents with   Right Hip - Follow-up    Right total hip arthroplasty 08/25/2023   Visit Diagnoses:  1. Status post total replacement of right hip     Plan: [name] is 3 months postop from a right total hip.  Her main complaint is some start of stiffness and pain and weakness in hip flexion.  She has pain with hip flexion as well.  No pain at rest.  Examination shows fully healed surgical scar.  She still has a small postop seroma.  No signs of infection.  Fluid painless range of motion in the hip.  She will continue work on hip flexor strengthening.  I offered to drain the seroma but she would like to let it resolve itself.  Recheck in 3 months with repeat radiographs.  Follow-Up Instructions: Return in about 3 months (around 02/17/2024).   Orders:  No orders of the defined types were placed in this encounter.  No orders of the defined types were placed in this encounter.   Imaging: No results found.  PMFS History: Patient Active Problem List   Diagnosis Date Noted   Status post total replacement of right hip 08/25/2023   Degenerative joint disease of right hip 08/24/2023   Spinal stenosis of lumbar region 10/09/2022   Facet arthritis of lumbar region 10/09/2022   Chronic bilateral low back pain without sciatica 10/04/2022   Osteopenia 11/26/2021   Pre-diabetes 11/26/2021   Other fatigue 07/28/2020   Dupuytren's disease of palm 01/19/2018   Viral URI 01/23/2017   Bilateral sensorineural hearing loss 03/12/2016   Routine general medical examination at a health care facility 07/22/2015   Stress fracture of neck of right femur 01/25/2015   Anemia 11/06/2012   Anxiety 11/06/2012   Depression 11/06/2012   Cataract, nuclear 10/28/2012    Primary open angle glaucoma of both eyes, indeterminate stage 10/28/2012   Ptosis of eyelid 10/28/2012   Bladder prolapse, female, acquired 01/09/2012   GERD (gastroesophageal reflux disease) 05/21/2007   Past Medical History:  Diagnosis Date   ALLERGIC RHINITIS    Allergy    ANEMIA-IRON DEFICIENCY    Anxiety state, unspecified    Carpal tunnel syndrome    pt denies    Cataract    Closed left ankle fracture    COLONIC POLYPS, HX OF 2007   GERD    GLAUCOMA    Glaucoma    GOITER, MULTINODULAR    on [place] 2006, unchanged 6/13 with nodules all <23mm   HIATAL HERNIA WITH REFLUX    Hip dysplasia, acquired    B sx; eval at [facility] for same 01/2015 -ongoing PT   OSTEOARTHRITIS    Osteoporosis    Serrated polyp of colon    TOBACCO USE, QUIT     Family History  Problem Relation Age of Onset   Diabetes Mother    Heart disease Mother    Arthritis Mother    Hypertension Mother    Arthritis Father    Colon cancer Paternal Grandmother    Esophageal cancer Neg Hx    Rectal cancer Neg Hx    Stomach cancer Neg Hx     Past Surgical History:  Procedure Laterality Date   BREAST SURGERY  1960   Left breast cyst removed no complications   CARPAL TUNNEL RELEASE Right    COLONOSCOPY  2005   EYE SURGERY  1991-1992   both eyes   EYE SURGERY Left 2014   shunt behind left eye   ORIF FIBULA FRACTURE Left 08/24/2019   Procedure: OPEN REDUCTION INTERNAL FIXATION (ORIF) LEFT DISTAL FIBULA FRACTURE;  Surgeon: Teryl Lucy, MD;  Location: [facility];  Service: Orthopedics;  Laterality: Left;   Right shoulder aurgery  10/2008   TOTAL HIP ARTHROPLASTY Right 08/25/2023   Procedure: RIGHT TOTAL HIP REPLACEMENT;  Surgeon: Tarry Kos, MD;  Location: [facility];  Service: Orthopedics;  Laterality: Right;  3-C   Social History   Occupational History   Not on file  Tobacco Use   Smoking status: Former    Current packs/day: 0.00    Types: Cigarettes    Quit date: 08/12/1988    Years since  quitting: 35.2    Passive exposure: Past   Smokeless tobacco: Never  Vaping Use   Vaping status: Never Used  Substance and Sexual Activity   Alcohol use: Yes    Alcohol/week: 1.0 standard drink of alcohol    Types: 1 Glasses of wine per week    Comment: occassionally   Drug use: No   Sexual activity: Not Currently    Birth control/protection: Post-menopausal

## 2023-11-19 ENCOUNTER — Ambulatory Visit: Payer: Medicare Other | Admitting: Physical Therapy

## 2023-11-19 ENCOUNTER — Telehealth: Payer: Self-pay | Admitting: Orthopaedic Surgery

## 2023-11-19 ENCOUNTER — Encounter: Payer: Self-pay | Admitting: Physical Therapy

## 2023-11-19 DIAGNOSIS — R262 Difficulty in walking, not elsewhere classified: Secondary | ICD-10-CM

## 2023-11-19 DIAGNOSIS — M6281 Muscle weakness (generalized): Secondary | ICD-10-CM

## 2023-11-19 DIAGNOSIS — M25551 Pain in right hip: Secondary | ICD-10-CM

## 2023-11-19 DIAGNOSIS — M545 Low back pain, unspecified: Secondary | ICD-10-CM

## 2023-11-19 DIAGNOSIS — R293 Abnormal posture: Secondary | ICD-10-CM

## 2023-11-19 DIAGNOSIS — R252 Cramp and spasm: Secondary | ICD-10-CM

## 2023-11-19 NOTE — Telephone Encounter (Signed)
 Pt called stating she is going to take the prolia for osteoporosis. She also has a couple of questions. Requesting a call back.

## 2023-11-19 NOTE — Telephone Encounter (Signed)
 Patient called returning your call. CB#[phone]

## 2023-11-19 NOTE — Therapy (Signed)
 OUTPATIENT PHYSICAL THERAPY TREATMENT NOTE    Patient Name: Linda [name] MRN: [id number] DOB:1941/05/25, 83 y.o., female Today's Date: 11/19/2023  END OF SESSION:  PT End of Session - 11/19/23 1046     Visit Number 15    Date for PT Re-Evaluation 12/12/23    Authorization Type BC/BS Medicare    Progress Note Due on Visit 20    PT Start Time 1046    PT Stop Time 1126    PT Time Calculation (min) 40 min    Activity Tolerance Patient tolerated treatment well    Behavior During Therapy WFL for tasks assessed/performed                     Past Medical History:  Diagnosis Date   ALLERGIC RHINITIS    Allergy    ANEMIA-IRON DEFICIENCY    Anxiety state, unspecified    Carpal tunnel syndrome    pt denies    Cataract    Closed left ankle fracture    COLONIC POLYPS, HX OF 2007   GERD    GLAUCOMA    Glaucoma    GOITER, MULTINODULAR    on [place] 2006, unchanged 6/13 with nodules all <33mm   HIATAL HERNIA WITH REFLUX    Hip dysplasia, acquired    B sx; eval at [facility] for same 01/2015 -ongoing PT   OSTEOARTHRITIS    Osteoporosis    Serrated polyp of colon    TOBACCO USE, QUIT    Past Surgical History:  Procedure Laterality Date   BREAST SURGERY  1960   Left breast cyst removed no complications   CARPAL TUNNEL RELEASE Right    COLONOSCOPY  2005   EYE SURGERY  1991-1992   both eyes   EYE SURGERY Left 2014   shunt behind left eye   ORIF FIBULA FRACTURE Left 08/24/2019   Procedure: OPEN REDUCTION INTERNAL FIXATION (ORIF) LEFT DISTAL FIBULA FRACTURE;  Surgeon: Teryl Lucy, MD;  Location: [facility];  Service: Orthopedics;  Laterality: Left;   Right shoulder aurgery  10/2008   TOTAL HIP ARTHROPLASTY Right 08/25/2023   Procedure: RIGHT TOTAL HIP REPLACEMENT;  Surgeon: Tarry Kos, MD;  Location: [facility] OR;  Service: Orthopedics;  Laterality: Right;  3-C   Patient Active Problem List   Diagnosis Date Noted   Status post total replacement of right hip  08/25/2023   Degenerative joint disease of right hip 08/24/2023   Spinal stenosis of lumbar region 10/09/2022   Facet arthritis of lumbar region 10/09/2022   Chronic bilateral low back pain without sciatica 10/04/2022   Osteopenia 11/26/2021   Pre-diabetes 11/26/2021   Other fatigue 07/28/2020   Dupuytren's disease of palm 01/19/2018   Viral URI 01/23/2017   Bilateral sensorineural hearing loss 03/12/2016   Routine general medical examination at a health care facility 07/22/2015   Stress fracture of neck of right femur 01/25/2015   Anemia 11/06/2012   Anxiety 11/06/2012   Depression 11/06/2012   Cataract, nuclear 10/28/2012   Primary open angle glaucoma of both eyes, indeterminate stage 10/28/2012   Ptosis of eyelid 10/28/2012   Bladder prolapse, female, acquired 01/09/2012   GERD (gastroesophageal reflux disease) 05/21/2007    PCP: Myrlene Broker, MD  REFERRING PROVIDER: Cristie Hem, PA-C  REFERRING DIAG: [phone] (ICD-10-CM) - Status post total replacement of right hip  THERAPY DIAG:  Difficulty in walking, not elsewhere classified  Muscle weakness (generalized)  Pain in right hip  Cramp and spasm  Abnormal posture  Chronic bilateral low back pain without sciatica  Rationale for Evaluation and Treatment: Rehabilitation  ONSET DATE: S/P Right THA on 08/25/2023  SUBJECTIVE:   SUBJECTIVE STATEMENT: Had a good visit with Dr Roda Shutters yesterday. He is pleased with my healing. Keep getting stronger! Marland Kitchen  PERTINENT HISTORY: Hx of back pain, R THA on 08/25/23, OA, Hx of Left ankle fracture in 2021, osteopenia  PAIN:  PAIN:  Are you having pain? No NPRS scale: Pain location:  Pain description: intermittent and aching  Aggravating factors: certain movements Relieving factors: rest   PRECAUTIONS: None  RED FLAGS: None   WEIGHT BEARING RESTRICTIONS: No  FALLS:  Has patient fallen in last 6 months? Yes. Number of falls 1 fall before surgery when she missed  a step and fell at her daughter's home  LIVING ENVIRONMENT: Lives with: lives alone Lives in: House/apartment Stairs:  two story home Has following equipment at home: Single point cane, Environmental consultant - 2 wheeled, shower chair, and bed side commode  OCCUPATION: Retired  PLOF: Independent and Vocation/Vocational requirements: water aerobics, church activities  PATIENT GOALS: To be independent again and return to driving and community activities.  NEXT MD VISIT: Osteoporosis Clinic with [place] Persons, PA on 09/22/23  OBJECTIVE:  Note: Objective measures were completed at Evaluation unless otherwise noted.  DIAGNOSTIC FINDINGS:  Pelvic Radiograph on 08/25/2023: IMPRESSION: Right hip arthroplasty without immediate postoperative complication.  PATIENT SURVEYS:  10/31/23: LEFS  /80 =  Eval:  LEFS 30 / 80 = 37.5 %  COGNITION: Overall cognitive status: Within functional limits for tasks assessed     SENSATION: Patient states some numbness at incision site.  MUSCLE LENGTH: Hamstrings: Right tightness noted  POSTURE: rounded shoulders and forward head  PALPATION: Tender to palpation along incision site  LOWER EXTREMITY ROM:  WFL  LOWER EXTREMITY MMT: 10/31/23: Rt hip extension 4/5 Rt hip flexion 4/5 with anterior hip pain Rt hip abduction 4/5 Rt Knee 5/5   Eval:   Right hip strength grossly 4 to 4+/5 Right quad and hamstring strength of 5-/5 Left LE strength is WFL   FUNCTIONAL TESTS:  Eval: 5 times sit to stand: 21.66 with required use of UE pushing chair handles Timed up and go (TUG): 20.66 sec with RW  09/26/23:  3 minute walk: covered 77' with gait belt and no AD  10/17/2023: 3 minute walk:  464 ft with [facility]  10/31/2023: 3 minute walk:  445 ft with SPC 5 X STS: 14.89 with light use of hands on thighs TUG: 15.90 with SPC, 15.10  GAIT: Distance walked: >200 ft Assistive device utilized: Walker - 2 wheeled Level of assistance: Modified independence Comments:  Pt with antalgic gait pattern                                                                                                                                TODAY'S TREATMENT   11/19/23:Pt arrives for aquatic  physical therapy. Treatment took place in 3.5-5.5 feet of water. Water temperature was 91 degrees F Pt entered the pool via stairs reciprocally but slow. Seated water bench with 75% submersion Pt performed seated LE AROM exercises 20x in all planes, concurrent discussion of her current status.Pt requires buoyancy of water for support and to offload joints with strengthening exercises.   75% depth water walking with UE push/pull with running arms 10x each direction. High knee marching holding the small noodle 6x. Bil hip kicks 3 ways 15x each holding on for balance adding ankle fins today for extra drag..Step ups forward 10x 2 sideways 10x2.Marland Kitchen Heel lifts 15x.2 Underwater bicycle on horseback 8 min.  11/14/2023: Nustep level 5 x6 min with PT present to discuss status Fig 4 stretch edge of mat table bil 30" Seated lumbar flexion ball rollouts 3x10" P/ROM Rt hip: IR, ER, flexion, abduction.  Soft tissue mobilization to right thigh/hip region and quad to promote tissue elasticity and decreased pain Supine bridge series each: basic bridge 8x5", bridge with hip abd yellow loop x5, bridge with ball squeeze x5 Supine clamshell with yellow loop 2x10 Rt hip flexor stretch with leg off side of mat table 2x30" Fwd step up 2" Rt with single UE support 1x10 Lateral step up 2" Rt with UE support 1x10   11/12/23:Pt arrives for aquatic physical therapy. Treatment took place in 3.5-5.5 feet of water. Water temperature was 91 degrees F Pt entered the pool via stairs reciprocally but slow. Seated water bench with 75% submersion Pt performed seated LE AROM exercises 20x in all planes, concurrent discussion of her current status.Pt requires buoyancy of water for support and to offload joints with strengthening  exercises.   75% depth water walking with UE push/pull with running arms 10x each direction. High knee marching holding the small noodle 6x. Bil hip kicks 3 ways 15x each holding on for balance.Step ups forward 1022x, sideways 10x.Marland Kitchen Heel lifts 15x. Underwater bicycle on horseback 8 min.   PATIENT EDUCATION:  Education details: Issued HEP Person educated: Patient Education method: Explanation, Facilities manager, and Handouts Education comprehension: verbalized understanding and returned demonstration  HOME EXERCISE PROGRAM: Access Code: [email or web address] URL: [email or web address] Date: 11/07/2023 Prepared by: Morton Peters  Exercises - Standing March with Counter Support  - 1 x daily - 7 x weekly - 2 sets - 10 reps - Standing Hip Abduction with Counter Support  - 1 x daily - 7 x weekly - 2 sets - 10 reps - Standing Hip Extension with Counter Support  - 1 x daily - 7 x weekly - 2 sets - 10 reps - Mini Squat with Counter Support  - 1 x daily - 7 x weekly - 2 sets - 10 reps - Heel Raises with Counter Support  - 1 x daily - 7 x weekly - 2 sets - 10 reps - Side Stepping with Counter Support  - 1 x daily - 7 x weekly - 3 sets - 10 reps - Supine Hip Adduction Isometric with Ball  - 1 x daily - 7 x weekly - 1 sets - 10 reps - 5 hold - Supine Bridge with Spinal Articulation  - 1 x daily - 7 x weekly - 1 sets - 10 reps - Sit to Stand  - 3 x daily - 7 x weekly - 1 sets - 10 reps - Lateral Step Up  - 1 x daily - 7 x weekly - 1 sets - 10 reps - Forward Step Up  - 1  x daily - 7 x weekly - 1 sets - 10 reps - Forward Step Down  - 1 x daily - 7 x weekly - 1 sets - 10 reps  ASSESSMENT:  CLINICAL IMPRESSION: Pt arrives to aquatic PT with no pain. Saw MD yesterday and got a good report. Advised pt to continue exercising and keep working on her hip strength. Added drag/resistance to her leg kicks today. Good response. No pain with steps up today.  OBJECTIVE IMPAIRMENTS: decreased balance, decreased  mobility, difficulty walking, decreased strength, increased muscle spasms, impaired flexibility, and pain.   ACTIVITY LIMITATIONS: lifting, bending, standing, squatting, sleeping, and stairs  PARTICIPATION LIMITATIONS: cleaning, laundry, driving, community activity, and church  PERSONAL FACTORS: Past/current experiences and 3+ comorbidities: osteopenia, Hx of left ankle Fx, s/p R THA on 08/25/2023  are also affecting patient's functional outcome.   REHAB POTENTIAL: Good  CLINICAL DECISION MAKING: Evolving/moderate complexity  EVALUATION COMPLEXITY: Moderate   GOALS: Goals reviewed with patient? Yes  SHORT TERM GOALS: Target date: 10/10/2023 Patient will be independent with initial HEP. Baseline: Goal status: Met  2.  Patient will participate in 3 or 6 minute walk test to establish a baseline. Baseline:  Goal status: Met  LONG TERM GOALS: Target date: 12/12/2023  Patient will be independent with advanced HEP to allow for self progression after discharge. Baseline:  Goal status: Ongoing 3/21  2.  Patient will increase right hip strength to [facility] to allow her to navigate stairs in her home with reciprocal pattern. Baseline:  Goal status: Ongoing 3/21  3.  Patient will be able to walk for at least 15 minutes with LRAD to allow for return to community ambulation. Baseline:  Goal status: Ongoing 3/21  4.  Patient will improve Lower Extremity Functional Scale to at least 50% to demonstrate improved functional mobility. Baseline: 37.5% Goal status: ongoing   5.  Patient will report ability to resume aquatic aerobics class and going on outings with her friends without increased pain. Baseline:  Goal status: ongoing - went twice last week and modified 3/21   PLAN:  PT FREQUENCY: 2x/week  PT DURATION: 8 weeks  PLANNED INTERVENTIONS: 97164- PT Re-evaluation, 97110-Therapeutic exercises, 97530- Therapeutic activity, 97112- Neuromuscular re-education, 97535- Self Care, 16109-  Manual therapy, [phone]- Gait training, [phone]- Aquatic Therapy, 97014- Electrical stimulation (unattended), [phone]- Electrical stimulation (manual), 97016- Vasopneumatic device, [id number]- Ultrasound, [id number]- Traction (mechanical), [id number]- Ionotophoresis 4mg /ml Dexamethasone, Patient/Family education, Balance training, Stair training, Taping, Dry Needling, Joint mobilization, Joint manipulation, Spinal manipulation, Spinal mobilization, Scar mobilization, Cryotherapy, and Moist heat  PLAN FOR NEXT SESSION:work on glut strength, continue leg press, step ups/downs with 2-4" step, NuStep, walking endurance and confidence, Assess and progress HEP as indicated, strengthening, flexibility, manual/dry needling as indicated, aquatic PT  Ane Payment, PTA 11/19/23 11:21 AM    [facility] [address] [place], [place] [address] Phone # [phone] Fax [phone]

## 2023-11-19 NOTE — Telephone Encounter (Signed)
 Tried to call. No answer. LMOM that if she still has questions to please call us back.

## 2023-11-20 ENCOUNTER — Telehealth: Payer: Self-pay | Admitting: Orthopaedic Surgery

## 2023-11-20 NOTE — Telephone Encounter (Signed)
 Patient called returning your call. She said to call her around 4pm. CB#[phone]

## 2023-11-20 NOTE — Telephone Encounter (Signed)
 Called patient back. She states that after much thought she will go forward with the prolia, but NOT the evenity. She cannot afford $500 each month for the evenity.  Please call for scheduling. She can best be reached around 4pm daily, but otherwise can be hard to reach. Thanks!

## 2023-11-20 NOTE — Telephone Encounter (Signed)
Double message

## 2023-11-24 ENCOUNTER — Encounter: Payer: Self-pay | Admitting: Rehabilitative and Restorative Service Providers"

## 2023-11-24 ENCOUNTER — Ambulatory Visit: Admitting: Rehabilitative and Restorative Service Providers"

## 2023-11-24 DIAGNOSIS — R262 Difficulty in walking, not elsewhere classified: Secondary | ICD-10-CM

## 2023-11-24 DIAGNOSIS — R252 Cramp and spasm: Secondary | ICD-10-CM

## 2023-11-24 DIAGNOSIS — M6281 Muscle weakness (generalized): Secondary | ICD-10-CM

## 2023-11-24 DIAGNOSIS — M25551 Pain in right hip: Secondary | ICD-10-CM

## 2023-11-24 NOTE — Therapy (Addendum)
 OUTPATIENT PHYSICAL THERAPY TREATMENT NOTE    Patient Name: Linda [name] MRN: [id number] DOB:[birth year], 83 y.o., female Today's Date: 11/24/2023  END OF SESSION:  PT End of Session - 11/24/23 0938     Visit Number 16    Date for PT Re-Evaluation 12/12/23    Authorization Type BC/BS Medicare    Progress Note Due on Visit 20    PT Start Time 0930    PT Stop Time 1010    PT Time Calculation (min) 40 min    Activity Tolerance Patient tolerated treatment well    Behavior During Therapy WFL for tasks assessed/performed                     Past Medical History:  Diagnosis Date   ALLERGIC RHINITIS    Allergy    ANEMIA-IRON DEFICIENCY    Anxiety state, unspecified    Carpal tunnel syndrome    pt denies    Cataract    Closed left ankle fracture    COLONIC POLYPS, HX OF 2007   GERD    GLAUCOMA    Glaucoma    GOITER, MULTINODULAR    on US  2006, unchanged 6/13 with nodules all <2mm   HIATAL HERNIA WITH REFLUX    Hip dysplasia, acquired    B sx; eval at [facility] for same 01/2015 -ongoing PT   OSTEOARTHRITIS    Osteoporosis    Serrated polyp of colon    TOBACCO USE, QUIT    Past Surgical History:  Procedure Laterality Date   BREAST SURGERY  1960   Left breast cyst removed no complications   CARPAL TUNNEL RELEASE Right    COLONOSCOPY  2005   EYE SURGERY  1991-1992   both eyes   EYE SURGERY Left 2014   shunt behind left eye   ORIF FIBULA FRACTURE Left 08/24/2019   Procedure: OPEN REDUCTION INTERNAL FIXATION (ORIF) LEFT DISTAL FIBULA FRACTURE;  Surgeon: Osa Blase, MD;  Location: [facility];  Service: Orthopedics;  Laterality: Left;   Right shoulder aurgery  10/2008   TOTAL HIP ARTHROPLASTY Right 08/25/2023   Procedure: RIGHT TOTAL HIP REPLACEMENT;  Surgeon: Wes Hamman, MD;  Location: [facility] OR;  Service: Orthopedics;  Laterality: Right;  3-C   Patient Active Problem List   Diagnosis Date Noted   Status post total replacement of right hip  08/25/2023   Degenerative joint disease of right hip 08/24/2023   Spinal stenosis of lumbar region 10/09/2022   Facet arthritis of lumbar region 10/09/2022   Chronic bilateral low back pain without sciatica 10/04/2022   Osteopenia 11/26/2021   Pre-diabetes 11/26/2021   Other fatigue 07/28/2020   Dupuytren's disease of palm 01/19/2018   Viral URI 01/23/2017   Bilateral sensorineural hearing loss 03/12/2016   Routine general medical examination at a health care facility 07/22/2015   Stress fracture of neck of right femur 01/25/2015   Anemia 11/06/2012   Anxiety 11/06/2012   Depression 11/06/2012   Cataract, nuclear 10/28/2012   Primary open angle glaucoma of both eyes, indeterminate stage 10/28/2012   Ptosis of eyelid 10/28/2012   Bladder prolapse, female, acquired 01/09/2012   GERD (gastroesophageal reflux disease) 05/21/2007    PCP: Adelia Homestead, MD  REFERRING PROVIDER: Sandie Cross, PA-C  REFERRING DIAG: [phone] (ICD-10-CM) - Status post total replacement of right hip  THERAPY DIAG:  Difficulty in walking, not elsewhere classified  Muscle weakness (generalized)  Pain in right hip  Cramp and spasm  Rationale for Evaluation and Treatment: Rehabilitation  ONSET DATE: S/P Right THA on 08/25/2023  SUBJECTIVE:   SUBJECTIVE STATEMENT:  Patient reports that she is having a lot of tightness today   PERTINENT HISTORY: Hx of back pain, R THA on 08/25/23, OA, Hx of Left ankle fracture in 2021, osteopenia  PAIN:  PAIN:  Are you having pain? Yes NPRS scale:  2/10 Pain location:  right hip Pain description: intermittent and aching  Aggravating factors: certain movements Relieving factors: rest   PRECAUTIONS: None  RED FLAGS: None   WEIGHT BEARING RESTRICTIONS: No  FALLS:  Has patient fallen in last 6 months? Yes. Number of falls 1 fall before surgery when she missed a step and fell at her daughter's home  LIVING ENVIRONMENT: Lives with: lives  alone Lives in: House/apartment Stairs:  two story home Has following equipment at home: Single point cane, Environmental consultant - 2 wheeled, shower chair, and bed side commode  OCCUPATION: Retired  PLOF: Independent and Vocation/Vocational requirements: water aerobics, church activities  PATIENT GOALS: To be independent again and return to driving and community activities.  NEXT MD VISIT: Osteoporosis Clinic with [place] Persons, PA on 09/22/23  OBJECTIVE:  Note: Objective measures were completed at Evaluation unless otherwise noted.  DIAGNOSTIC FINDINGS:  Pelvic Radiograph on 08/25/2023: IMPRESSION: Right hip arthroplasty without immediate postoperative complication.  PATIENT SURVEYS:  10/31/23: LEFS  /80 =  Eval:  LEFS 30 / 80 = 37.5 %  COGNITION: Overall cognitive status: Within functional limits for tasks assessed     SENSATION: Patient states some numbness at incision site.  MUSCLE LENGTH: Hamstrings: Right tightness noted  POSTURE: rounded shoulders and forward head  PALPATION: Tender to palpation along incision site  LOWER EXTREMITY ROM:  WFL  LOWER EXTREMITY MMT: 10/31/23: Rt hip extension 4/5 Rt hip flexion 4/5 with anterior hip pain Rt hip abduction 4/5 Rt Knee 5/5   Eval:   Right hip strength grossly 4 to 4+/5 Right quad and hamstring strength of 5-/5 Left LE strength is WFL   FUNCTIONAL TESTS:  Eval: 5 times sit to stand: 21.66 with required use of UE pushing chair handles Timed up and go (TUG): 20.66 sec with RW  09/26/23:  3 minute walk: covered 56' with gait belt and no AD  10/17/2023: 3 minute walk:  464 ft with [facility]  10/31/2023: 3 minute walk:  445 ft with SPC 5 X STS: 14.89 with light use of hands on thighs TUG: 15.90 with SPC, 15.10  GAIT: Distance walked: >200 ft Assistive device utilized: Environmental consultant - 2 wheeled Level of assistance: Modified independence Comments: Pt with antalgic gait pattern                                                                                                                                 TODAY'S TREATMENT  11/24/2023: Nustep level 5 x6 min with PT present to discuss status Fig 4 stretch edge of  mat table bil 30" Seated lumbar flexion ball rollouts 5x10" P/ROM Rt hip: IR, ER, flexion, abduction.  Soft tissue mobilization to right thigh/hip region and quad to promote tissue elasticity and decreased pain using roller (staying away from knot area in her anterior hip region) Prone quad stretch with strap 3x20 sec bilat Supine clamshell with yellow loop 2x10 Supine bridge with yellow loop around knees 2x5   11/19/23: Pt arrives for aquatic physical therapy. Treatment took place in 3.5-5.5 feet of water. Water temperature was 91 degrees F Pt entered the pool via stairs reciprocally but slow. Seated water bench with 75% submersion Pt performed seated LE AROM exercises 20x in all planes, concurrent discussion of her current status.Pt requires buoyancy of water for support and to offload joints with strengthening exercises.   75% depth water walking with UE push/pull with running arms 10x each direction. High knee marching holding the small noodle 6x. Bil hip kicks 3 ways 15x each holding on for balance adding ankle fins today for extra drag..Step ups forward 10x 2 sideways 10x2.Aaron Aas Heel lifts 15x.2 Underwater bicycle on horseback 8 min.  11/14/2023: Nustep level 5 x6 min with PT present to discuss status Fig 4 stretch edge of mat table bil 30" Seated lumbar flexion ball rollouts 3x10" P/ROM Rt hip: IR, ER, flexion, abduction.  Soft tissue mobilization to right thigh/hip region and quad to promote tissue elasticity and decreased pain Supine bridge series each: basic bridge 8x5", bridge with hip abd yellow loop x5, bridge with ball squeeze x5 Supine clamshell with yellow loop 2x10 Rt hip flexor stretch with leg off side of mat table 2x30" Fwd step up 2" Rt with single UE support 1x10 Lateral step up 2" Rt with UE  support 1x10   PATIENT EDUCATION:  Education details: Issued HEP Person educated: Patient Education method: Explanation, Demonstration, and Handouts Education comprehension: verbalized understanding and returned demonstration  HOME EXERCISE PROGRAM: Access Code: [email or web address] URL: [email or web address] Date: 11/07/2023 Prepared by: Raynell Caller  Exercises - Standing March with Counter Support  - 1 x daily - 7 x weekly - 2 sets - 10 reps - Standing Hip Abduction with Counter Support  - 1 x daily - 7 x weekly - 2 sets - 10 reps - Standing Hip Extension with Counter Support  - 1 x daily - 7 x weekly - 2 sets - 10 reps - Mini Squat with Counter Support  - 1 x daily - 7 x weekly - 2 sets - 10 reps - Heel Raises with Counter Support  - 1 x daily - 7 x weekly - 2 sets - 10 reps - Side Stepping with Counter Support  - 1 x daily - 7 x weekly - 3 sets - 10 reps - Supine Hip Adduction Isometric with Ball  - 1 x daily - 7 x weekly - 1 sets - 10 reps - 5 hold - Supine Bridge with Spinal Articulation  - 1 x daily - 7 x weekly - 1 sets - 10 reps - Sit to Stand  - 3 x daily - 7 x weekly - 1 sets - 10 reps - Lateral Step Up  - 1 x daily - 7 x weekly - 1 sets - 10 reps - Forward Step Up  - 1 x daily - 7 x weekly - 1 sets - 10 reps - Forward Step Down  - 1 x daily - 7 x weekly - 1 sets - 10 reps  ASSESSMENT:  CLINICAL IMPRESSION:  Ms Eskin  presents to skilled PT reporting that she is having a lot of stiffness this morning.  Patient reports that she may contact her MD to ask about the knot on her right anterior hip, as her MD stated that he may need to aspirate it.  Patient advised to contact her MD given the increased edema in the area and she states that she will likely do that later today.  Patient continues to progress with goal related activities.  OBJECTIVE IMPAIRMENTS: decreased balance, decreased mobility, difficulty walking, decreased strength, increased muscle spasms, impaired  flexibility, and pain.   ACTIVITY LIMITATIONS: lifting, bending, standing, squatting, sleeping, and stairs  PARTICIPATION LIMITATIONS: cleaning, laundry, driving, community activity, and church  PERSONAL FACTORS: Past/current experiences and 3+ comorbidities: osteopenia, Hx of left ankle Fx, s/p R THA on 08/25/2023  are also affecting patient's functional outcome.   REHAB POTENTIAL: Good  CLINICAL DECISION MAKING: Evolving/moderate complexity  EVALUATION COMPLEXITY: Moderate   GOALS: Goals reviewed with patient? Yes  SHORT TERM GOALS: Target date: 10/10/2023 Patient will be independent with initial HEP. Baseline: Goal status: Met  2.  Patient will participate in 3 or 6 minute walk test to establish a baseline. Baseline:  Goal status: Met  LONG TERM GOALS: Target date: 12/12/2023  Patient will be independent with advanced HEP to allow for self progression after discharge. Baseline:  Goal status: Ongoing 3/21  2.  Patient will increase right hip strength to [facility] to allow her to navigate stairs in her home with reciprocal pattern. Baseline:  Goal status: Ongoing 3/21  3.  Patient will be able to walk for at least 15 minutes with LRAD to allow for return to community ambulation. Baseline:  Goal status: Ongoing 3/21  4.  Patient will improve Lower Extremity Functional Scale to at least 50% to demonstrate improved functional mobility. Baseline: 37.5% Goal status: ongoing   5.  Patient will report ability to resume aquatic aerobics class and going on outings with her friends without increased pain. Baseline:  Goal status: ongoing - went twice last week and modified 3/21   PLAN:  PT FREQUENCY: 2x/week  PT DURATION: 8 weeks  PLANNED INTERVENTIONS: 97164- PT Re-evaluation, 97110-Therapeutic exercises, 97530- Therapeutic activity, 97112- Neuromuscular re-education, 97535- Self Care, 82956- Manual therapy, [phone]- Gait training, [phone]- Aquatic Therapy, 97014- Electrical  stimulation (unattended), [phone]- Electrical stimulation (manual), 97016- Vasopneumatic device, [id number]- Ultrasound, [id number]- Traction (mechanical), [id number]- Ionotophoresis 4mg /ml Dexamethasone, Patient/Family education, Balance training, Stair training, Taping, Dry Needling, Joint mobilization, Joint manipulation, Spinal manipulation, Spinal mobilization, Scar mobilization, Cryotherapy, and Moist heat  PLAN FOR NEXT SESSION:work on glut strength, continue leg press, step ups/downs with 2-4" step, NuStep, walking endurance and confidence, Assess and progress HEP as indicated, strengthening, flexibility, manual/dry needling as indicated, aquatic PT   Robyne Christen, PT, DPT 11/24/23, 10:15 AM  [facility] [address], [address] [place], [place] [address] Phone # [phone] Fax [phone]

## 2023-11-25 NOTE — Therapy (Unsigned)
 OUTPATIENT PHYSICAL THERAPY TREATMENT NOTE    Patient Name: Linda [name] MRN: [id number] DOB:07-30-1941, 83 y.o., female Today's Date: 11/26/2023  END OF SESSION:  PT End of Session - 11/26/23 1056     Visit Number 17    Date for PT Re-Evaluation 12/12/23    Authorization Type BC/BS Medicare    Progress Note Due on Visit 20    PT Start Time 1055    PT Stop Time 1135    PT Time Calculation (min) 40 min    Activity Tolerance Patient tolerated treatment well    Behavior During Therapy WFL for tasks assessed/performed                      Past Medical History:  Diagnosis Date   ALLERGIC RHINITIS    Allergy    ANEMIA-IRON DEFICIENCY    Anxiety state, unspecified    Carpal tunnel syndrome    pt denies    Cataract    Closed left ankle fracture    COLONIC POLYPS, HX OF 2007   GERD    GLAUCOMA    Glaucoma    GOITER, MULTINODULAR    on [place] 2006, unchanged 6/13 with nodules all <20mm   HIATAL HERNIA WITH REFLUX    Hip dysplasia, acquired    B sx; eval at [facility] for same 01/2015 -ongoing PT   OSTEOARTHRITIS    Osteoporosis    Serrated polyp of colon    TOBACCO USE, QUIT    Past Surgical History:  Procedure Laterality Date   BREAST SURGERY  1960   Left breast cyst removed no complications   CARPAL TUNNEL RELEASE Right    COLONOSCOPY  2005   EYE SURGERY  1991-1992   both eyes   EYE SURGERY Left 2014   shunt behind left eye   ORIF FIBULA FRACTURE Left 08/24/2019   Procedure: OPEN REDUCTION INTERNAL FIXATION (ORIF) LEFT DISTAL FIBULA FRACTURE;  Surgeon: Teryl Lucy, MD;  Location: [facility];  Service: Orthopedics;  Laterality: Left;   Right shoulder aurgery  10/2008   TOTAL HIP ARTHROPLASTY Right 08/25/2023   Procedure: RIGHT TOTAL HIP REPLACEMENT;  Surgeon: Tarry Kos, MD;  Location: [facility] OR;  Service: Orthopedics;  Laterality: Right;  3-C   Patient Active Problem List   Diagnosis Date Noted   Status post total replacement of right hip  08/25/2023   Degenerative joint disease of right hip 08/24/2023   Spinal stenosis of lumbar region 10/09/2022   Facet arthritis of lumbar region 10/09/2022   Chronic bilateral low back pain without sciatica 10/04/2022   Osteopenia 11/26/2021   Pre-diabetes 11/26/2021   Other fatigue 07/28/2020   Dupuytren's disease of palm 01/19/2018   Viral URI 01/23/2017   Bilateral sensorineural hearing loss 03/12/2016   Routine general medical examination at a health care facility 07/22/2015   Stress fracture of neck of right femur 01/25/2015   Anemia 11/06/2012   Anxiety 11/06/2012   Depression 11/06/2012   Cataract, nuclear 10/28/2012   Primary open angle glaucoma of both eyes, indeterminate stage 10/28/2012   Ptosis of eyelid 10/28/2012   Bladder prolapse, female, acquired 01/09/2012   GERD (gastroesophageal reflux disease) 05/21/2007    PCP: Myrlene Broker, MD  REFERRING PROVIDER: Cristie Hem, PA-C  REFERRING DIAG: [phone] (ICD-10-CM) - Status post total replacement of right hip  THERAPY DIAG:  Difficulty in walking, not elsewhere classified  Muscle weakness (generalized)  Pain in right hip  Cramp and spasm  Abnormal posture  Chronic bilateral low back pain without sciatica  Rationale for Evaluation and Treatment: Rehabilitation  ONSET DATE: S/P Right THA on 08/25/2023  SUBJECTIVE:   SUBJECTIVE STATEMENT: Feel good this morning.    PERTINENT HISTORY: Hx of back pain, R THA on 08/25/23, OA, Hx of Left ankle fracture in 2021, osteopenia  PAIN:  PAIN:  Are you having pain? Yes NPRS scale:  0-1/10 Pain location:  right hip Pain description: intermittent and aching  Aggravating factors: certain movements Relieving factors: rest   PRECAUTIONS: None  RED FLAGS: None   WEIGHT BEARING RESTRICTIONS: No  FALLS:  Has patient fallen in last 6 months? Yes. Number of falls 1 fall before surgery when she missed a step and fell at her daughter's home  LIVING  ENVIRONMENT: Lives with: lives alone Lives in: House/apartment Stairs:  two story home Has following equipment at home: Single point cane, Environmental consultant - 2 wheeled, shower chair, and bed side commode  OCCUPATION: Retired  PLOF: Independent and Vocation/Vocational requirements: water aerobics, church activities  PATIENT GOALS: To be independent again and return to driving and community activities.  NEXT MD VISIT: Osteoporosis Clinic with [name], PA on 09/22/23  OBJECTIVE:  Note: Objective measures were completed at Evaluation unless otherwise noted.  DIAGNOSTIC FINDINGS:  Pelvic Radiograph on 08/25/2023: IMPRESSION: Right hip arthroplasty without immediate postoperative complication.  PATIENT SURVEYS:  10/31/23: LEFS  /80 =  Eval:  LEFS 30 / 80 = 37.5 %  COGNITION: Overall cognitive status: Within functional limits for tasks assessed     SENSATION: Patient states some numbness at incision site.  MUSCLE LENGTH: Hamstrings: Right tightness noted  POSTURE: rounded shoulders and forward head  PALPATION: Tender to palpation along incision site  LOWER EXTREMITY ROM:  WFL  LOWER EXTREMITY MMT: 10/31/23: Rt hip extension 4/5 Rt hip flexion 4/5 with anterior hip pain Rt hip abduction 4/5 Rt Knee 5/5   Eval:   Right hip strength grossly 4 to 4+/5 Right quad and hamstring strength of 5-/5 Left LE strength is WFL   FUNCTIONAL TESTS:  Eval: 5 times sit to stand: 21.66 with required use of UE pushing chair handles Timed up and go (TUG): 20.66 sec with RW  09/26/23:  3 minute walk: covered 37' with gait belt and no AD  10/17/2023: 3 minute walk:  464 ft with [facility]  10/31/2023: 3 minute walk:  445 ft with SPC 5 X STS: 14.89 with light use of hands on thighs TUG: 15.90 with SPC, 15.10  GAIT: Distance walked: >200 ft Assistive device utilized: Walker - 2 wheeled Level of assistance: Modified independence Comments: Pt with antalgic gait pattern                                                                                                                                 TODAY'S TREATMENT   11/26/23:Pt arrives for aquatic physical therapy. Treatment took place in 3.5-5.5 feet  of water. Water temperature was 91 degrees F Pt entered the pool via stairs reciprocally but slow. Seated water bench with 75% submersion Pt performed seated LE AROM exercises 20x in all planes, concurrent discussion of her current status.Pt requires buoyancy of water for support and to offload joints with strengthening exercises.   75% depth water walking with UE push/pull with UE floats 10x each direction. High knee marching holding the small noodle 6x. Bil hip kicks 3 ways 20x each holding on for balance adding ankle fins today for extra drag..Step ups forward 10x 2 sideways 10x2.Marland Kitchen Heel lifts 15x.2  Standing hip Ext rot 10x Bil holding onto wall. Standing lunge stretch on second step for Rt hip flexor 3x 20 sec. Underwater bicycle on horseback 10 min.  11/24/2023: Nustep level 5 x6 min with PT present to discuss status Fig 4 stretch edge of mat table bil 30" Seated lumbar flexion ball rollouts 5x10" P/ROM Rt hip: IR, ER, flexion, abduction.  Soft tissue mobilization to right thigh/hip region and quad to promote tissue elasticity and decreased pain using roller (staying away from knot area in her anterior hip region) Prone quad stretch with strap 3x20 sec bilat Supine clamshell with yellow loop 2x10 Supine bridge with yellow loop around knees 2x5   11/19/23: Pt arrives for aquatic physical therapy. Treatment took place in 3.5-5.5 feet of water. Water temperature was 91 degrees F Pt entered the pool via stairs reciprocally but slow. Seated water bench with 75% submersion Pt performed seated LE AROM exercises 20x in all planes, concurrent discussion of her current status.Pt requires buoyancy of water for support and to offload joints with strengthening exercises.   75% depth water  walking with UE push/pull with running arms 10x each direction. High knee marching holding the small noodle 6x. Bil hip kicks 3 ways 15x each holding on for balance adding ankle fins today for extra drag..Step ups forward 10x 2 sideways 10x2.Marland Kitchen Heel lifts 15x.2 Underwater bicycle on horseback 8 min.   PATIENT EDUCATION:  Education details: Issued HEP Person educated: Patient Education method: Explanation, Facilities manager, and Handouts Education comprehension: verbalized understanding and returned demonstration  HOME EXERCISE PROGRAM: Access Code: [email or web address] URL: [email or web address] Date: 11/07/2023 Prepared by: Morton Peters  Exercises - Standing March with Counter Support  - 1 x daily - 7 x weekly - 2 sets - 10 reps - Standing Hip Abduction with Counter Support  - 1 x daily - 7 x weekly - 2 sets - 10 reps - Standing Hip Extension with Counter Support  - 1 x daily - 7 x weekly - 2 sets - 10 reps - Mini Squat with Counter Support  - 1 x daily - 7 x weekly - 2 sets - 10 reps - Heel Raises with Counter Support  - 1 x daily - 7 x weekly - 2 sets - 10 reps - Side Stepping with Counter Support  - 1 x daily - 7 x weekly - 3 sets - 10 reps - Supine Hip Adduction Isometric with Ball  - 1 x daily - 7 x weekly - 1 sets - 10 reps - 5 hold - Supine Bridge with Spinal Articulation  - 1 x daily - 7 x weekly - 1 sets - 10 reps - Sit to Stand  - 3 x daily - 7 x weekly - 1 sets - 10 reps - Lateral Step Up  - 1 x daily - 7 x weekly - 1 sets - 10 reps - Forward Step Up  -  1 x daily - 7 x weekly - 1 sets - 10 reps - Forward Step Down  - 1 x daily - 7 x weekly - 1 sets - 10 reps  ASSESSMENT:  CLINICAL IMPRESSION: Pt continues to progress very well with her aquatic exercises We continue to add small changes here and there to her routine but she is almost independent in her program and its progressions to maximize her independence. Some "I feel it" moments today increasing the reps of her hip  abduction and circumduction.   OBJECTIVE IMPAIRMENTS: decreased balance, decreased mobility, difficulty walking, decreased strength, increased muscle spasms, impaired flexibility, and pain.   ACTIVITY LIMITATIONS: lifting, bending, standing, squatting, sleeping, and stairs  PARTICIPATION LIMITATIONS: cleaning, laundry, driving, community activity, and church  PERSONAL FACTORS: Past/current experiences and 3+ comorbidities: osteopenia, Hx of left ankle Fx, s/p R THA on 08/25/2023  are also affecting patient's functional outcome.   REHAB POTENTIAL: Good  CLINICAL DECISION MAKING: Evolving/moderate complexity  EVALUATION COMPLEXITY: Moderate   GOALS: Goals reviewed with patient? Yes  SHORT TERM GOALS: Target date: 10/10/2023 Patient will be independent with initial HEP. Baseline: Goal status: Met  2.  Patient will participate in 3 or 6 minute walk test to establish a baseline. Baseline:  Goal status: Met  LONG TERM GOALS: Target date: 12/12/2023  Patient will be independent with advanced HEP to allow for self progression after discharge. Baseline:  Goal status: Ongoing 3/21  2.  Patient will increase right hip strength to [facility] to allow her to navigate stairs in her home with reciprocal pattern. Baseline:  Goal status: Ongoing 3/21  3.  Patient will be able to walk for at least 15 minutes with LRAD to allow for return to community ambulation. Baseline:  Goal status: Ongoing 3/21  4.  Patient will improve Lower Extremity Functional Scale to at least 50% to demonstrate improved functional mobility. Baseline: 37.5% Goal status: ongoing   5.  Patient will report ability to resume aquatic aerobics class and going on outings with her friends without increased pain. Baseline:  Goal status: ongoing - went twice last week and modified 3/21   PLAN:  PT FREQUENCY: 2x/week  PT DURATION: 8 weeks  PLANNED INTERVENTIONS: 97164- PT Re-evaluation, 97110-Therapeutic exercises, 97530-  Therapeutic activity, 97112- Neuromuscular re-education, 97535- Self Care, 40981- Manual therapy, [phone]- Gait training, [phone]- Aquatic Therapy, 97014- Electrical stimulation (unattended), [phone]- Electrical stimulation (manual), 97016- Vasopneumatic device, [id number]- Ultrasound, [id number]- Traction (mechanical), [id number]- Ionotophoresis 4mg /ml Dexamethasone, Patient/Family education, Balance training, Stair training, Taping, Dry Needling, Joint mobilization, Joint manipulation, Spinal manipulation, Spinal mobilization, Scar mobilization, Cryotherapy, and Moist heat  PLAN FOR NEXT SESSION:work on glut strength, continue leg press, step ups/downs with 2-4" step, NuStep, walking endurance and confidence, Assess and progress HEP as indicated, strengthening, flexibility, manual/dry needling as indicated, aquatic PT  Bethanne Brooks, PTA 11/26/23 11:29 AM    [facility] [address], [address] [place], [place] [address] Phone # [phone] Fax [phone]

## 2023-11-26 ENCOUNTER — Encounter: Payer: Self-pay | Admitting: Physical Therapy

## 2023-11-26 ENCOUNTER — Ambulatory Visit: Payer: Medicare Other | Admitting: Physical Therapy

## 2023-11-26 DIAGNOSIS — M545 Low back pain, unspecified: Secondary | ICD-10-CM

## 2023-11-26 DIAGNOSIS — M25551 Pain in right hip: Secondary | ICD-10-CM

## 2023-11-26 DIAGNOSIS — R262 Difficulty in walking, not elsewhere classified: Secondary | ICD-10-CM | POA: Diagnosis not present

## 2023-11-26 DIAGNOSIS — M6281 Muscle weakness (generalized): Secondary | ICD-10-CM

## 2023-11-26 DIAGNOSIS — R252 Cramp and spasm: Secondary | ICD-10-CM

## 2023-11-26 DIAGNOSIS — R293 Abnormal posture: Secondary | ICD-10-CM

## 2023-11-26 NOTE — Telephone Encounter (Signed)
 Noted. Will call patient.

## 2023-12-05 ENCOUNTER — Ambulatory Visit: Admitting: Rehabilitative and Restorative Service Providers"

## 2023-12-05 ENCOUNTER — Encounter: Payer: Self-pay | Admitting: Rehabilitative and Restorative Service Providers"

## 2023-12-05 DIAGNOSIS — R262 Difficulty in walking, not elsewhere classified: Secondary | ICD-10-CM | POA: Diagnosis not present

## 2023-12-05 DIAGNOSIS — M6281 Muscle weakness (generalized): Secondary | ICD-10-CM

## 2023-12-05 DIAGNOSIS — M25551 Pain in right hip: Secondary | ICD-10-CM

## 2023-12-05 DIAGNOSIS — R252 Cramp and spasm: Secondary | ICD-10-CM

## 2023-12-05 NOTE — Therapy (Signed)
 OUTPATIENT PHYSICAL THERAPY TREATMENT NOTE    Patient Name: Linda [name] MRN: [id number] DOB:22-Jun-1941, 83 y.o., female Today's Date: 12/05/2023  Progress Note Reporting Period 09/18/2023 to 12/05/2023  See note below for Objective Data and Assessment of Progress/Goals.      END OF SESSION:  PT End of Session - 12/05/23 1103     Visit Number 18    Date for PT Re-Evaluation 12/12/23    Authorization Type BC/BS Medicare    Progress Note Due on Visit 28    PT Start Time 1100    PT Stop Time 1140    PT Time Calculation (min) 40 min    Activity Tolerance Patient tolerated treatment well    Behavior During Therapy WFL for tasks assessed/performed              Past Medical History:  Diagnosis Date   ALLERGIC RHINITIS    Allergy    ANEMIA-IRON DEFICIENCY    Anxiety state, unspecified    Carpal tunnel syndrome    pt denies    Cataract    Closed left ankle fracture    COLONIC POLYPS, HX OF 2007   GERD    GLAUCOMA    Glaucoma    GOITER, MULTINODULAR    on US  2006, unchanged 6/13 with nodules all <31mm   HIATAL HERNIA WITH REFLUX    Hip dysplasia, acquired    B sx; eval at [facility] for same 01/2015 -ongoing PT   OSTEOARTHRITIS    Osteoporosis    Serrated polyp of colon    TOBACCO USE, QUIT    Past Surgical History:  Procedure Laterality Date   BREAST SURGERY  1960   Left breast cyst removed no complications   CARPAL TUNNEL RELEASE Right    COLONOSCOPY  2005   EYE SURGERY  1991-1992   both eyes   EYE SURGERY Left 2014   shunt behind left eye   ORIF FIBULA FRACTURE Left 08/24/2019   Procedure: OPEN REDUCTION INTERNAL FIXATION (ORIF) LEFT DISTAL FIBULA FRACTURE;  Surgeon: Osa Blase, MD;  Location: [facility];  Service: Orthopedics;  Laterality: Left;   Right shoulder aurgery  10/2008   TOTAL HIP ARTHROPLASTY Right 08/25/2023   Procedure: RIGHT TOTAL HIP REPLACEMENT;  Surgeon: Wes Hamman, MD;  Location: [facility] OR;  Service: Orthopedics;   Laterality: Right;  3-C   Patient Active Problem List   Diagnosis Date Noted   Status post total replacement of right hip 08/25/2023   Degenerative joint disease of right hip 08/24/2023   Spinal stenosis of lumbar region 10/09/2022   Facet arthritis of lumbar region 10/09/2022   Chronic bilateral low back pain without sciatica 10/04/2022   Osteopenia 11/26/2021   Pre-diabetes 11/26/2021   Other fatigue 07/28/2020   Dupuytren's disease of palm 01/19/2018   Viral URI 01/23/2017   Bilateral sensorineural hearing loss 03/12/2016   Routine general medical examination at a health care facility 07/22/2015   Stress fracture of neck of right femur 01/25/2015   Anemia 11/06/2012   Anxiety 11/06/2012   Depression 11/06/2012   Cataract, nuclear 10/28/2012   Primary open angle glaucoma of both eyes, indeterminate stage 10/28/2012   Ptosis of eyelid 10/28/2012   Bladder prolapse, female, acquired 01/09/2012   GERD (gastroesophageal reflux disease) 05/21/2007    PCP: Adelia Homestead, MD  REFERRING PROVIDER: Sandie Cross, PA-C  REFERRING DIAG: [phone] (ICD-10-CM) - Status post total replacement of right hip  THERAPY DIAG:  Difficulty in walking, not  elsewhere classified  Muscle weakness (generalized)  Pain in right hip  Cramp and spasm  Rationale for Evaluation and Treatment: Rehabilitation  ONSET DATE: S/P Right THA on 08/25/2023  SUBJECTIVE:   SUBJECTIVE STATEMENT:  Patient reports that she will go to see Dr Christiane Cowing next week to assess the knot on her anterior thigh    PERTINENT HISTORY: Hx of back pain, R THA on 08/25/23, OA, Hx of Left ankle fracture in 2021, osteopenia  PAIN:  PAIN:  Are you having pain? Yes NPRS scale:  2-3/10 Pain location:  right hip Pain description: intermittent and aching  Aggravating factors: certain movements Relieving factors: rest   PRECAUTIONS: None  RED FLAGS: None   WEIGHT BEARING RESTRICTIONS: No  FALLS:  Has patient  fallen in last 6 months? Yes. Number of falls 1 fall before surgery when she missed a step and fell at her daughter's home  LIVING ENVIRONMENT: Lives with: lives alone Lives in: House/apartment Stairs:  two story home Has following equipment at home: Single point cane, Environmental consultant - 2 wheeled, shower chair, and bed side commode  OCCUPATION: Retired  PLOF: Independent and Vocation/Vocational requirements: water aerobics, church activities  PATIENT GOALS: To be independent again and return to driving and community activities.  NEXT MD VISIT: Osteoporosis Clinic with [name], PA on 09/22/23  OBJECTIVE:  Note: Objective measures were completed at Evaluation unless otherwise noted.  DIAGNOSTIC FINDINGS:  Pelvic Radiograph on 08/25/2023: IMPRESSION: Right hip arthroplasty without immediate postoperative complication.  PATIENT SURVEYS:   Eval:  LEFS 30 / 80 = 37.5 % 12/05/2023:  Lower Extremity Functional Score: 58 / 80 = 72.5 %  COGNITION: Overall cognitive status: Within functional limits for tasks assessed     SENSATION: Patient states some numbness at incision site.  MUSCLE LENGTH: Hamstrings: Right tightness noted  POSTURE: rounded shoulders and forward head  PALPATION: Tender to palpation along incision site  LOWER EXTREMITY ROM:  WFL  LOWER EXTREMITY MMT: 10/31/23: Rt hip extension 4/5 Rt hip flexion 4/5 with anterior hip pain Rt hip abduction 4/5 Rt Knee 5/5   Eval:   Right hip strength grossly 4 to 4+/5 Right quad and hamstring strength of 5-/5 Left LE strength is WFL   FUNCTIONAL TESTS:  Eval: 5 times sit to stand: 21.66 with required use of UE pushing chair handles Timed up and go (TUG): 20.66 sec with RW  09/26/23:  3 minute walk: covered 57' with gait belt and no AD  10/17/2023: 3 minute walk:  464 ft with [facility]  10/31/2023: 3 minute walk:  445 ft with SPC 5 X STS: 14.89 with light use of hands on thighs TUG: 15.90 with SPC,  15.10  12/05/2023: 3 minute walk: 632 ft without assistive device Timed up and go (TUG): 10.74 sec  GAIT: Distance walked: >200 ft Assistive device utilized: Walker - 2 wheeled Level of assistance: Modified independence Comments: Pt with antalgic gait pattern  TODAY'S TREATMENT   12/05/2023: Nustep level 5 x6 min with PT present to discuss status Fig 4 stretch edge of mat table bil 30" 3 minute walk: 632 ft without assistive device Supine bridge with yellow loop around knees 2x5 Long sitting with Right foot lifting over a small cone and back 3x5 Lower Extremity Functional Score: 58 / 80 = 72.5 % FWD step ups onto 6" step with bilat UE support 2x5 bilat Side step ups onto 6" step with bilat UE x5 bilat   11/26/23: Pt arrives for aquatic physical therapy. Treatment took place in 3.5-5.5 feet of water. Water temperature was 91 degrees F Pt entered the pool via stairs reciprocally but slow. Seated water bench with 75% submersion Pt performed seated LE AROM exercises 20x in all planes, concurrent discussion of her current status.Pt requires buoyancy of water for support and to offload joints with strengthening exercises.   75% depth water walking with UE push/pull with UE floats 10x each direction. High knee marching holding the small noodle 6x. Bil hip kicks 3 ways 20x each holding on for balance adding ankle fins today for extra drag..Step ups forward 10x 2 sideways 10x2.Aaron Aas Heel lifts 15x.2  Standing hip Ext rot 10x Bil holding onto wall. Standing lunge stretch on second step for Rt hip flexor 3x 20 sec. Underwater bicycle on horseback 10 min.  11/24/2023: Nustep level 5 x6 min with PT present to discuss status Fig 4 stretch edge of mat table bil 30" Seated lumbar flexion ball rollouts 5x10" P/ROM Rt hip: IR, ER, flexion, abduction.  Soft tissue mobilization to  right thigh/hip region and quad to promote tissue elasticity and decreased pain using roller (staying away from knot area in her anterior hip region) Prone quad stretch with strap 3x20 sec bilat Supine clamshell with yellow loop 2x10 Supine bridge with yellow loop around knees 2x5    PATIENT EDUCATION:  Education details: Issued HEP Person educated: Patient Education method: Explanation, Demonstration, and Handouts Education comprehension: verbalized understanding and returned demonstration  HOME EXERCISE PROGRAM: Access Code: [email or web address] URL: [email or web address] Date: 11/07/2023 Prepared by: Raynell Caller  Exercises - Standing March with Counter Support  - 1 x daily - 7 x weekly - 2 sets - 10 reps - Standing Hip Abduction with Counter Support  - 1 x daily - 7 x weekly - 2 sets - 10 reps - Standing Hip Extension with Counter Support  - 1 x daily - 7 x weekly - 2 sets - 10 reps - Mini Squat with Counter Support  - 1 x daily - 7 x weekly - 2 sets - 10 reps - Heel Raises with Counter Support  - 1 x daily - 7 x weekly - 2 sets - 10 reps - Side Stepping with Counter Support  - 1 x daily - 7 x weekly - 3 sets - 10 reps - Supine Hip Adduction Isometric with Ball  - 1 x daily - 7 x weekly - 1 sets - 10 reps - 5 hold - Supine Bridge with Spinal Articulation  - 1 x daily - 7 x weekly - 1 sets - 10 reps - Sit to Stand  - 3 x daily - 7 x weekly - 1 sets - 10 reps - Lateral Step Up  - 1 x daily - 7 x weekly - 1 sets - 10 reps - Forward Step Up  - 1 x daily - 7 x weekly - 1 sets - 10 reps - Forward Step Down  -  1 x daily - 7 x weekly - 1 sets - 10 reps  ASSESSMENT:  CLINICAL IMPRESSION:  Ms Bauder presents to skilled PT reporting that she has an appointment scheduled with Dr Christiane Cowing for possible aspiration of the knot on her right anterior hip.  Patient with marked improvement of 3 minute walk test today during session.  She also had great improvement on TUG and Lower Extremity  Functional Scale, as well.  Patient with difficulty initially with long sitting movement with foot over small cone, but she stated that it got easier as she continued.  Patient continues to require skilled PT to progress towards goal related activities.   OBJECTIVE IMPAIRMENTS: decreased balance, decreased mobility, difficulty walking, decreased strength, increased muscle spasms, impaired flexibility, and pain.   ACTIVITY LIMITATIONS: lifting, bending, standing, squatting, sleeping, and stairs  PARTICIPATION LIMITATIONS: cleaning, laundry, driving, community activity, and church  PERSONAL FACTORS: Past/current experiences and 3+ comorbidities: osteopenia, Hx of left ankle Fx, s/p R THA on 08/25/2023  are also affecting patient's functional outcome.   REHAB POTENTIAL: Good  CLINICAL DECISION MAKING: Evolving/moderate complexity  EVALUATION COMPLEXITY: Moderate   GOALS: Goals reviewed with patient? Yes  SHORT TERM GOALS: Target date: 10/10/2023 Patient will be independent with initial HEP. Baseline: Goal status: Met  2.  Patient will participate in 3 or 6 minute walk test to establish a baseline. Baseline:  Goal status: Met  LONG TERM GOALS: Target date: 12/12/2023  Patient will be independent with advanced HEP to allow for self progression after discharge. Baseline:  Goal status: Ongoing 3/21  2.  Patient will increase right hip strength to [facility] to allow her to navigate stairs in her home with reciprocal pattern. Baseline:  Goal status: Ongoing 3/21  3.  Patient will be able to walk for at least 15 minutes with LRAD to allow for return to community ambulation. Baseline:  Goal status: Ongoing 3/21  4.  Patient will improve Lower Extremity Functional Scale to at least 50% to demonstrate improved functional mobility. Baseline: 37.5% Goal status: Met on 12/05/23  5.  Patient will report ability to resume aquatic aerobics class and going on outings with her friends without  increased pain. Baseline:  Goal status: ongoing - went twice last week and modified 3/21   PLAN:  PT FREQUENCY: 2x/week  PT DURATION: 8 weeks  PLANNED INTERVENTIONS: 97164- PT Re-evaluation, 97110-Therapeutic exercises, 97530- Therapeutic activity, 97112- Neuromuscular re-education, 97535- Self Care, 16109- Manual therapy, [id number]- Gait training, [phone]- Aquatic Therapy, 97014- Electrical stimulation (unattended), [id number]- Electrical stimulation (manual), [id number]- Vasopneumatic device, [id number]- Ultrasound, [id number]- Traction (mechanical), [id number]- Ionotophoresis 4mg /ml Dexamethasone , Patient/Family education, Balance training, Stair training, Taping, Dry Needling, Joint mobilization, Joint manipulation, Spinal manipulation, Spinal mobilization, Scar mobilization, Cryotherapy, and Moist heat  PLAN FOR NEXT SESSION:work on glut strength, continue leg press, step ups/downs with 2-4" step, NuStep, walking endurance and confidence, Assess and progress HEP as indicated, strengthening, flexibility, manual/dry needling as indicated, aquatic PT.  Upcoming reassessment on 12/12/23.   Robyne Christen, PT, DPT 12/05/23, 11:57 AM  [facility] [address] [place], [place] [address] Phone # [phone] Fax [phone]

## 2023-12-10 ENCOUNTER — Ambulatory Visit: Admitting: Physical Therapy

## 2023-12-10 ENCOUNTER — Encounter: Payer: Self-pay | Admitting: Physical Therapy

## 2023-12-10 DIAGNOSIS — M6281 Muscle weakness (generalized): Secondary | ICD-10-CM

## 2023-12-10 DIAGNOSIS — M545 Low back pain, unspecified: Secondary | ICD-10-CM

## 2023-12-10 DIAGNOSIS — R262 Difficulty in walking, not elsewhere classified: Secondary | ICD-10-CM

## 2023-12-10 DIAGNOSIS — R252 Cramp and spasm: Secondary | ICD-10-CM

## 2023-12-10 DIAGNOSIS — M25551 Pain in right hip: Secondary | ICD-10-CM

## 2023-12-10 DIAGNOSIS — R293 Abnormal posture: Secondary | ICD-10-CM

## 2023-12-10 NOTE — Therapy (Signed)
 OUTPATIENT PHYSICAL THERAPY TREATMENT NOTE    Patient Name: Linda [name] MRN: [id number] DOB:17-Aug-1940, 83 y.o., female Today's Date: 12/10/2023   END OF SESSION:  PT End of Session - 12/10/23 0802     Visit Number 19    Date for PT Re-Evaluation 12/12/23    Authorization Type BC/BS Medicare    Progress Note Due on Visit 28    PT Start Time 0800    PT Stop Time 0845    PT Time Calculation (min) 45 min    Activity Tolerance Patient tolerated treatment well    Behavior During Therapy WFL for tasks assessed/performed              Past Medical History:  Diagnosis Date   ALLERGIC RHINITIS    Allergy    ANEMIA-IRON DEFICIENCY    Anxiety state, unspecified    Carpal tunnel syndrome    pt denies    Cataract    Closed left ankle fracture    COLONIC POLYPS, HX OF 2007   GERD    GLAUCOMA    Glaucoma    GOITER, MULTINODULAR    on US  2006, unchanged 6/13 with nodules all <38mm   HIATAL HERNIA WITH REFLUX    Hip dysplasia, acquired    B sx; eval at [facility] for same 01/2015 -ongoing PT   OSTEOARTHRITIS    Osteoporosis    Serrated polyp of colon    TOBACCO USE, QUIT    Past Surgical History:  Procedure Laterality Date   BREAST SURGERY  1960   Left breast cyst removed no complications   CARPAL TUNNEL RELEASE Right    COLONOSCOPY  2005   EYE SURGERY  1991-1992   both eyes   EYE SURGERY Left 2014   shunt behind left eye   ORIF FIBULA FRACTURE Left 08/24/2019   Procedure: OPEN REDUCTION INTERNAL FIXATION (ORIF) LEFT DISTAL FIBULA FRACTURE;  Surgeon: Osa Blase, MD;  Location: [facility];  Service: Orthopedics;  Laterality: Left;   Right shoulder aurgery  10/2008   TOTAL HIP ARTHROPLASTY Right 08/25/2023   Procedure: RIGHT TOTAL HIP REPLACEMENT;  Surgeon: Wes Hamman, MD;  Location: [facility] OR;  Service: Orthopedics;  Laterality: Right;  3-C   Patient Active Problem List   Diagnosis Date Noted   Status post total replacement of right hip 08/25/2023    Degenerative joint disease of right hip 08/24/2023   Spinal stenosis of lumbar region 10/09/2022   Facet arthritis of lumbar region 10/09/2022   Chronic bilateral low back pain without sciatica 10/04/2022   Osteopenia 11/26/2021   Pre-diabetes 11/26/2021   Other fatigue 07/28/2020   Dupuytren's disease of palm 01/19/2018   Viral URI 01/23/2017   Bilateral sensorineural hearing loss 03/12/2016   Routine general medical examination at a health care facility 07/22/2015   Stress fracture of neck of right femur 01/25/2015   Anemia 11/06/2012   Anxiety 11/06/2012   Depression 11/06/2012   Cataract, nuclear 10/28/2012   Primary open angle glaucoma of both eyes, indeterminate stage 10/28/2012   Ptosis of eyelid 10/28/2012   Bladder prolapse, female, acquired 01/09/2012   GERD (gastroesophageal reflux disease) 05/21/2007    PCP: Adelia Homestead, MD  REFERRING PROVIDER: Sandie Cross, PA-C  REFERRING DIAG: [phone] (ICD-10-CM) - Status post total replacement of right hip  THERAPY DIAG:  Difficulty in walking, not elsewhere classified  Muscle weakness (generalized)  Cramp and spasm  Pain in right hip  Chronic bilateral low back pain without  sciatica  Abnormal posture  Rationale for Evaluation and Treatment: Rehabilitation  ONSET DATE: S/P Right THA on 08/25/2023  SUBJECTIVE:   SUBJECTIVE STATEMENT: I want to get this thing aspirated, it is bugging me. I am making myself do the stairs more consistently   PERTINENT HISTORY: Hx of back pain, R THA on 08/25/23, OA, Hx of Left ankle fracture in 2021, osteopenia  PAIN:  PAIN:  Are you having pain? Yes NPRS scale:  2-3/10 Pain location:  right hip Pain description: intermittent and aching  Aggravating factors: certain movements Relieving factors: rest   PRECAUTIONS: None  RED FLAGS: None   WEIGHT BEARING RESTRICTIONS: No  FALLS:  Has patient fallen in last 6 months? Yes. Number of falls 1 fall before surgery  when she missed a step and fell at her daughter's home  LIVING ENVIRONMENT: Lives with: lives alone Lives in: House/apartment Stairs:  two story home Has following equipment at home: Single point cane, Environmental consultant - 2 wheeled, shower chair, and bed side commode  OCCUPATION: Retired  PLOF: Independent and Vocation/Vocational requirements: water aerobics, church activities  PATIENT GOALS: To be independent again and return to driving and community activities.  NEXT MD VISIT: Osteoporosis [facility] with [name], PA on 09/22/23  OBJECTIVE:  Note: Objective measures were completed at Evaluation unless otherwise noted.  DIAGNOSTIC FINDINGS:  Pelvic Radiograph on 08/25/2023: IMPRESSION: Right hip arthroplasty without immediate postoperative complication.  PATIENT SURVEYS:   Eval:  LEFS 30 / 80 = 37.5 % 12/05/2023:  Lower Extremity Functional Score: 58 / 80 = 72.5 %  COGNITION: Overall cognitive status: Within functional limits for tasks assessed     SENSATION: Patient states some numbness at incision site.  MUSCLE LENGTH: Hamstrings: Right tightness noted  POSTURE: rounded shoulders and forward head  PALPATION: Tender to palpation along incision site  LOWER EXTREMITY ROM:  WFL  LOWER EXTREMITY MMT: 10/31/23: Rt hip extension 4/5 Rt hip flexion 4/5 with anterior hip pain Rt hip abduction 4/5 Rt Knee 5/5   Eval:   Right hip strength grossly 4 to 4+/5 Right quad and hamstring strength of 5-/5 Left LE strength is WFL   FUNCTIONAL TESTS:  Eval: 5 times sit to stand: 21.66 with required use of UE pushing chair handles Timed up and go (TUG): 20.66 sec with RW  09/26/23:  3 minute walk: covered 29' with gait belt and no AD  10/17/2023: 3 minute walk:  464 ft with [facility]  10/31/2023: 3 minute walk:  445 ft with SPC 5 X STS: 14.89 with light use of hands on thighs TUG: 15.90 with SPC, 15.10  12/05/2023: 3 minute walk: 632 ft without assistive device Timed up and go  (TUG): 10.74 sec  GAIT: Distance walked: >200 ft Assistive device utilized: Walker - 2 wheeled Level of assistance: Modified independence Comments: Pt with antalgic gait pattern  TODAY'S TREATMENT   12/10/23:Pt arrives for aquatic physical therapy. Treatment took place in 3.5-5.5 feet of water. Water temperature was 91 degrees F Pt entered the pool via stairs reciprocally but slow. Seated water bench with 75% submersion Pt performed seated LE AROM exercises 20x in all planes, concurrent discussion of her current status.Pt requires buoyancy of water for support and to offload joints with strengthening exercises.   75% depth water walking with UE push/pull with UE floats 10x each direction. High knee marching holding the small noodle 6x. Bil hip kicks 3 ways 20x each holding on for balance adding ankle fins today for extra drag..Step ups forward 10x 2 sideways 10x2 done today with no UE. Heel lifts 15x.2  Standing hip Ext rot 10x Bil holding onto wall. Standing lunge stretch on second step for Rt hip flexor 3x 20 sec. Underwater bicycle on horseback 10 min.   12/05/2023: Nustep level 5 x6 min with PT present to discuss status Fig 4 stretch edge of mat table bil 30" 3 minute walk: 632 ft without assistive device Supine bridge with yellow loop around knees 2x5 Long sitting with Right foot lifting over a small cone and back 3x5 Lower Extremity Functional Score: 58 / 80 = 72.5 % FWD step ups onto 6" step with bilat UE support 2x5 bilat Side step ups onto 6" step with bilat UE x5 bilat   11/26/23: Pt arrives for aquatic physical therapy. Treatment took place in 3.5-5.5 feet of water. Water temperature was 91 degrees F Pt entered the pool via stairs reciprocally but slow. Seated water bench with 75% submersion Pt performed seated LE AROM exercises 20x in all planes,  concurrent discussion of her current status.Pt requires buoyancy of water for support and to offload joints with strengthening exercises.   75% depth water walking with UE push/pull with UE floats 10x each direction. High knee marching holding the small noodle 6x. Bil hip kicks 3 ways 20x each holding on for balance adding ankle fins today for extra drag..Step ups forward 10x 2 sideways 10x2.Aaron Aas Heel lifts 15x.2  Standing hip Ext rot 10x Bil holding onto wall. Standing lunge stretch on second step for Rt hip flexor 3x 20 sec. Underwater bicycle on horseback 10 min.  PATIENT EDUCATION:  Education details: Issued HEP Person educated: Patient Education method: Explanation, Facilities manager, and Handouts Education comprehension: verbalized understanding and returned demonstration  HOME EXERCISE PROGRAM: Access Code: [email or web address] URL: [email or web address] Date: 11/07/2023 Prepared by: Raynell Caller  Exercises - Standing March with Counter Support  - 1 x daily - 7 x weekly - 2 sets - 10 reps - Standing Hip Abduction with Counter Support  - 1 x daily - 7 x weekly - 2 sets - 10 reps - Standing Hip Extension with Counter Support  - 1 x daily - 7 x weekly - 2 sets - 10 reps - Mini Squat with Counter Support  - 1 x daily - 7 x weekly - 2 sets - 10 reps - Heel Raises with Counter Support  - 1 x daily - 7 x weekly - 2 sets - 10 reps - Side Stepping with Counter Support  - 1 x daily - 7 x weekly - 3 sets - 10 reps - Supine Hip Adduction Isometric with Ball  - 1 x daily - 7 x weekly - 1 sets - 10 reps - 5 hold - Supine Bridge with Spinal Articulation  - 1 x daily - 7 x weekly - 1 sets - 10 reps -  Sit to Stand  - 3 x daily - 7 x weekly - 1 sets - 10 reps - Lateral Step Up  - 1 x daily - 7 x weekly - 1 sets - 10 reps - Forward Step Up  - 1 x daily - 7 x weekly - 1 sets - 10 reps - Forward Step Down  - 1 x daily - 7 x weekly - 1 sets - 10 reps  ASSESSMENT:  CLINICAL IMPRESSION: Today we worked  on step ups without holding on to further challenge her Rt hip strength, stabilization and balance. We were able to progress onto this component today because she had made good progress performing the exercise with some mild rail support. Definitely unstable with no UE support but new to this skill.   OBJECTIVE IMPAIRMENTS: decreased balance, decreased mobility, difficulty walking, decreased strength, increased muscle spasms, impaired flexibility, and pain.   ACTIVITY LIMITATIONS: lifting, bending, standing, squatting, sleeping, and stairs  PARTICIPATION LIMITATIONS: cleaning, laundry, driving, community activity, and church  PERSONAL FACTORS: Past/current experiences and 3+ comorbidities: osteopenia, Hx of left ankle Fx, s/p R THA on 08/25/2023  are also affecting patient's functional outcome.   REHAB POTENTIAL: Good  CLINICAL DECISION MAKING: Evolving/moderate complexity  EVALUATION COMPLEXITY: Moderate   GOALS: Goals reviewed with patient? Yes  SHORT TERM GOALS: Target date: 10/10/2023 Patient will be independent with initial HEP. Baseline: Goal status: Met  2.  Patient will participate in 3 or 6 minute walk test to establish a baseline. Baseline:  Goal status: Met  LONG TERM GOALS: Target date: 12/12/2023  Patient will be independent with advanced HEP to allow for self progression after discharge. Baseline:  Goal status: Ongoing 3/21  2.  Patient will increase right hip strength to [facility] to allow her to navigate stairs in her home with reciprocal pattern. Baseline:  Goal status: Ongoing 3/21  3.  Patient will be able to walk for at least 15 minutes with LRAD to allow for return to community ambulation. Baseline:  Goal status: Ongoing 3/21  4.  Patient will improve Lower Extremity Functional Scale to at least 50% to demonstrate improved functional mobility. Baseline: 37.5% Goal status: Met on 12/05/23  5.  Patient will report ability to resume aquatic aerobics class and  going on outings with her friends without increased pain. Baseline:  Goal status: ongoing - went twice last week and modified 3/21   PLAN:  PT FREQUENCY: 2x/week  PT DURATION: 8 weeks  PLANNED INTERVENTIONS: 97164- PT Re-evaluation, 97110-Therapeutic exercises, 97530- Therapeutic activity, 97112- Neuromuscular re-education, 97535- Self Care, 14782- Manual therapy, [id number]- Gait training, [phone]- Aquatic Therapy, 97014- Electrical stimulation (unattended), [id number]- Electrical stimulation (manual), [id number]- Vasopneumatic device, [id number]- Ultrasound, [id number]- Traction (mechanical), [id number]- Ionotophoresis 4mg /ml Dexamethasone , Patient/Family education, Balance training, Stair training, Taping, Dry Needling, Joint mobilization, Joint manipulation, Spinal manipulation, Spinal mobilization, Scar mobilization, Cryotherapy, and Moist heat  PLAN FOR NEXT SESSION:ERO on Friday.  Bethanne Brooks, PTA 12/10/23 10:11 AM   [facility] [address] [place], [place] [address] Phone # [phone] Fax [phone]

## 2023-12-11 ENCOUNTER — Ambulatory Visit: Admitting: Orthopaedic Surgery

## 2023-12-11 DIAGNOSIS — Z96641 Presence of right artificial hip joint: Secondary | ICD-10-CM

## 2023-12-11 MED ORDER — DOXYCYCLINE HYCLATE 100 MG PO TABS: 100.0000 mg | ORAL_TABLET | Freq: Two times a day (BID) | ORAL | 0 refills | Status: AC

## 2023-12-11 NOTE — Progress Notes (Signed)
   Office Visit Note   Patient: Linda [name]           Date of Birth: 1940-10-26           MRN: [id number] Visit Date: 12/11/2023              Requested by: Adelia Homestead, MD [address] [place],  [place] [address] PCP: Adelia Homestead, MD   Assessment & Plan: Visit Diagnoses:  1. Status post total replacement of right hip     Plan: History of Present Illness Linda [name] is an 83 year old female who presents with persistent swelling following right total hip replacement surgery.  She underwent right total hip replacement on August 25, 2023. Since then, she experiences persistent swelling at the surgical site. The swelling presents as a bothersome bulge that has not improved over time and continues to cause discomfort.  Physical Exam MUSCULOSKELETAL: Postsurgical focal and contained swelling of the right hip without signs of infection.  Assessment and Plan Postsurgical seroma right hip following total hip arthroplasty - 40 cc of dark bloody seroma aspirated from the hip, no signs of infection - will place her on 1 week of doxy prophylactically - follow up as scheduled for 6 months postop check with repeat xrays  Follow-Up Instructions: Return for as scheduled in July.   Orders:  No orders of the defined types were placed in this encounter.  Meds ordered this encounter  Medications   doxycycline  (VIBRA -TABS) 100 MG tablet    Sig: Take 1 tablet (100 mg total) by mouth 2 (two) times daily for 7 days.    Dispense:  14 tablet    Refill:  0      Procedures: No procedures performed   Clinical Data: No additional findings.   Subjective: Chief Complaint  Patient presents with   Right Hip - Follow-up    Right total hip arthroplasty 08/25/2023

## 2023-12-12 ENCOUNTER — Ambulatory Visit: Attending: Physician Assistant | Admitting: Physical Therapy

## 2023-12-12 ENCOUNTER — Encounter: Payer: Self-pay | Admitting: Physical Therapy

## 2023-12-12 DIAGNOSIS — G8929 Other chronic pain: Secondary | ICD-10-CM | POA: Diagnosis present

## 2023-12-12 DIAGNOSIS — M25551 Pain in right hip: Secondary | ICD-10-CM | POA: Diagnosis present

## 2023-12-12 DIAGNOSIS — M6281 Muscle weakness (generalized): Secondary | ICD-10-CM | POA: Diagnosis present

## 2023-12-12 DIAGNOSIS — R293 Abnormal posture: Secondary | ICD-10-CM | POA: Diagnosis present

## 2023-12-12 DIAGNOSIS — R262 Difficulty in walking, not elsewhere classified: Secondary | ICD-10-CM | POA: Insufficient documentation

## 2023-12-12 DIAGNOSIS — R252 Cramp and spasm: Secondary | ICD-10-CM | POA: Insufficient documentation

## 2023-12-12 DIAGNOSIS — M545 Low back pain, unspecified: Secondary | ICD-10-CM | POA: Insufficient documentation

## 2023-12-12 NOTE — Therapy (Signed)
 OUTPATIENT PHYSICAL THERAPY TREATMENT NOTE    Patient Name: Linda [name] MRN: [id number] DOB:08/11/1941, 83 y.o., female Today's Date: 12/12/2023   END OF SESSION:  PT End of Session - 12/12/23 1017     Visit Number 20    Date for PT Re-Evaluation 01/12/24    Authorization Type BC/BS Medicare    Progress Note Due on Visit 28    PT Start Time 1015    PT Stop Time 1100    PT Time Calculation (min) 45 min    Activity Tolerance Patient tolerated treatment well    Behavior During Therapy WFL for tasks assessed/performed               Past Medical History:  Diagnosis Date   ALLERGIC RHINITIS    Allergy    ANEMIA-IRON DEFICIENCY    Anxiety state, unspecified    Carpal tunnel syndrome    pt denies    Cataract    Closed left ankle fracture    COLONIC POLYPS, HX OF 2007   GERD    GLAUCOMA    Glaucoma    GOITER, MULTINODULAR    on US  2006, unchanged 6/13 with nodules all <31mm   HIATAL HERNIA WITH REFLUX    Hip dysplasia, acquired    B sx; eval at [facility] for same 01/2015 -ongoing PT   OSTEOARTHRITIS    Osteoporosis    Serrated polyp of colon    TOBACCO USE, QUIT    Past Surgical History:  Procedure Laterality Date   BREAST SURGERY  1960   Left breast cyst removed no complications   CARPAL TUNNEL RELEASE Right    COLONOSCOPY  2005   EYE SURGERY  1991-1992   both eyes   EYE SURGERY Left 2014   shunt behind left eye   ORIF FIBULA FRACTURE Left 08/24/2019   Procedure: OPEN REDUCTION INTERNAL FIXATION (ORIF) LEFT DISTAL FIBULA FRACTURE;  Surgeon: Osa Blase, MD;  Location: [facility];  Service: Orthopedics;  Laterality: Left;   Right shoulder aurgery  10/2008   TOTAL HIP ARTHROPLASTY Right 08/25/2023   Procedure: RIGHT TOTAL HIP REPLACEMENT;  Surgeon: Wes Hamman, MD;  Location: [facility] OR;  Service: Orthopedics;  Laterality: Right;  3-C   Patient Active Problem List   Diagnosis Date Noted   Status post total replacement of right hip 08/25/2023    Degenerative joint disease of right hip 08/24/2023   Spinal stenosis of lumbar region 10/09/2022   Facet arthritis of lumbar region 10/09/2022   Chronic bilateral low back pain without sciatica 10/04/2022   Osteopenia 11/26/2021   Pre-diabetes 11/26/2021   Other fatigue 07/28/2020   Dupuytren's disease of palm 01/19/2018   Viral URI 01/23/2017   Bilateral sensorineural hearing loss 03/12/2016   Routine general medical examination at a health care facility 07/22/2015   Stress fracture of neck of right femur 01/25/2015   Anemia 11/06/2012   Anxiety 11/06/2012   Depression 11/06/2012   Cataract, nuclear 10/28/2012   Primary open angle glaucoma of both eyes, indeterminate stage 10/28/2012   Ptosis of eyelid 10/28/2012   Bladder prolapse, female, acquired 01/09/2012   GERD (gastroesophageal reflux disease) 05/21/2007    PCP: Adelia Homestead, MD  REFERRING PROVIDER: Sandie Cross, PA-C  REFERRING DIAG: [phone] (ICD-10-CM) - Status post total replacement of right hip  THERAPY DIAG:  Difficulty in walking, not elsewhere classified  Muscle weakness (generalized)  Cramp and spasm  Pain in right hip  Chronic bilateral low back pain  without sciatica  Abnormal posture  Rationale for Evaluation and Treatment: Rehabilitation  ONSET DATE: S/P Right THA on 08/25/2023  SUBJECTIVE:   SUBJECTIVE STATEMENT:  Yesterday, per chart review [name] had 40cc of dark bloody seroma aspirated from anterior hip with no signs of infection (Dr Christiane Cowing).  I need to keep working on my hip strength for stairs.  I am no longer using the cane.   PERTINENT HISTORY: Hx of back pain, R THA on 08/25/23, OA, Hx of Left ankle fracture in 2021, osteopenia  PAIN:  PAIN:  Are you having pain? Yes NPRS scale:  0-4/10, none today Pain location:  right hip Pain description: intermittent and aching  Aggravating factors: sitting and then need to stand, going up the steps Relieving factors:  rest   PRECAUTIONS: None  RED FLAGS: None   WEIGHT BEARING RESTRICTIONS: No  FALLS:  Has patient fallen in last 6 months? Yes. Number of falls 1 fall before surgery when she missed a step and fell at her daughter's home  LIVING ENVIRONMENT: Lives with: lives alone Lives in: House/apartment Stairs:  two story home Has following equipment at home: Single point cane, Environmental consultant - 2 wheeled, shower chair, and bed side commode  OCCUPATION: Retired  PLOF: Independent and Vocation/Vocational requirements: water aerobics, church activities  PATIENT GOALS: To be independent again and return to driving and community activities.  NEXT MD VISIT: Osteoporosis Clinic with [name], PA on 09/22/23  OBJECTIVE:  Note: Objective measures were completed at Evaluation unless otherwise noted.  DIAGNOSTIC FINDINGS:  Pelvic Radiograph on 08/25/2023: IMPRESSION: Right hip arthroplasty without immediate postoperative complication.  PATIENT SURVEYS:   Eval:  LEFS 30 / 80 = 37.5 % 12/05/2023:  Lower Extremity Functional Score: 58 / 80 = 72.5 %  COGNITION: Overall cognitive status: Within functional limits for tasks assessed     SENSATION: Patient states some numbness at incision site.  MUSCLE LENGTH: Hamstrings: Right tightness noted  POSTURE: rounded shoulders and forward head  PALPATION: Tender to palpation along incision site  LOWER EXTREMITY ROM:  WFL  LOWER EXTREMITY MMT: 12/12/23: [name]: Quad 4+/5 Hip ext 4/5 Hip abd 4/5  10/31/23: Rt hip extension 4/5 Rt hip flexion 4/5 with anterior hip pain Rt hip abduction 4/5 Rt Knee 5/5   Eval:   Right hip strength grossly 4 to 4+/5 Right quad and hamstring strength of 5-/5 Left LE strength is WFL   FUNCTIONAL TESTS:  Eval: 5 times sit to stand: 21.66 with required use of UE pushing chair handles Timed up and go (TUG): 20.66 sec with RW  09/26/23:  3 minute walk: covered 42' with gait belt and no AD  10/17/2023: 3  minute walk:  464 ft with [facility]  10/31/2023: 3 minute walk:  445 ft with SPC 5 X STS: 14.89 with light use of hands on thighs TUG: 15.90 with SPC, 15.10  12/05/2023: 3 minute walk: 632 ft without assistive device Timed up and go (TUG): 10.74 sec  12/12/2023: Stairs: reciprocal pattern with good quad and glute activation and light use of bil rail, no momentum used to step up onto Rt side    GAIT: Distance walked: >200 ft Assistive device utilized: Environmental consultant - 2 wheeled Level of assistance: Modified independence Comments: Pt with antalgic gait pattern  TODAY'S TREATMENT  12/12/23: NuStep L5 x 10' PT present to go over goals PT sent message to surgeon to see if he would clear [name] for DN to lumbar spine - (he wrote back after session completed and cleared her for this next time) Reciprocal stairs with bil light rail 1 round Rt fwd step up 6" bil rail x10 Rt lateral step up 6" single rail x10 Leg press bil 55lb x10, 65lb x10. Rt single leg press 30lb x8 Sit to stand: no UE assist, using forward bil UE reach x10 Supine hooklying red loop Rt clam x8 slow and controlled - good awareness of Rt hip musculature Supine bridge with red loop hip abd x5 slow and controlled - good awareness of Rt hip musculature  12/10/23:Pt arrives for aquatic physical therapy. Treatment took place in 3.5-5.5 feet of water. Water temperature was 91 degrees F Pt entered the pool via stairs reciprocally but slow. Seated water bench with 75% submersion Pt performed seated LE AROM exercises 20x in all planes, concurrent discussion of her current status.Pt requires buoyancy of water for support and to offload joints with strengthening exercises.   75% depth water walking with UE push/pull with UE floats 10x each direction. High knee marching holding the small noodle 6x. Bil hip kicks 3 ways 20x each  holding on for balance adding ankle fins today for extra drag..Step ups forward 10x 2 sideways 10x2 done today with no UE. Heel lifts 15x.2  Standing hip Ext rot 10x Bil holding onto wall. Standing lunge stretch on second step for Rt hip flexor 3x 20 sec. Underwater bicycle on horseback 10 min.   12/05/2023: Nustep level 5 x6 min with PT present to discuss status Fig 4 stretch edge of mat table bil 30" 3 minute walk: 632 ft without assistive device Supine bridge with yellow loop around knees 2x5 Long sitting with Right foot lifting over a small cone and back 3x5 Lower Extremity Functional Score: 58 / 80 = 72.5 % FWD step ups onto 6" step with bilat UE support 2x5 bilat Side step ups onto 6" step with bilat UE x5 bilat  PATIENT EDUCATION:  Education details: Issued HEP Person educated: Patient Education method: Explanation, Demonstration, and Handouts Education comprehension: verbalized understanding and returned demonstration  HOME EXERCISE PROGRAM: Access Code: [email or web address] URL: [email or web address] Date: 12/12/2023 Prepared by: Raynell Caller  Exercises - Standing March with Counter Support  - 1 x daily - 7 x weekly - 2 sets - 10 reps - Standing Hip Abduction with Counter Support  - 1 x daily - 7 x weekly - 2 sets - 10 reps - Standing Hip Extension with Counter Support  - 1 x daily - 7 x weekly - 2 sets - 10 reps - Mini Squat with Counter Support  - 1 x daily - 7 x weekly - 2 sets - 10 reps - Heel Raises with Counter Support  - 1 x daily - 7 x weekly - 2 sets - 10 reps - Side Stepping with Counter Support  - 1 x daily - 7 x weekly - 3 sets - 10 reps - Supine Hip Adduction Isometric with Ball  - 1 x daily - 7 x weekly - 1 sets - 10 reps - 5 hold - Sit to Stand  - 3 x daily - 7 x weekly - 1 sets - 10 reps - Lateral Step Up  - 1 x daily - 7 x weekly - 1 sets - 10 reps - Forward Step Up  -  1 x daily - 7 x weekly - 1 sets - 10 reps - Forward Step Down  - 1 x daily - 7 x  weekly - 1 sets - 10 reps - Bridge with Hip Abduction and Resistance  - 1 x daily - 7 x weekly - 2 sets - 5 reps  ASSESSMENT:  CLINICAL IMPRESSION:  Pt is making great gains in Rt hip strength, demonstrating significant improvement in stair climbing with reciprocal pattern, d/c of cane with gait, 3 min walk test distance and sit to stand without UE use.  She had 40cc seroma drained from anterior aspect of Rt hip yesterday and will keep an eye on return of fluid.  PT messaged surgeon who did clear her for DN as needed to lumbar and posterior hip now that she is nearly 4 mos out from surgery.  Pt has met most goals and will likely be ready to d/c in 2-4 weeks following a few more aquatic sessions and addition of DN to remaining pain areas related to hip function (lumbar, gluteals).  OBJECTIVE IMPAIRMENTS: decreased balance, decreased mobility, difficulty walking, decreased strength, increased muscle spasms, impaired flexibility, and pain.   ACTIVITY LIMITATIONS: lifting, bending, standing, squatting, sleeping, and stairs  PARTICIPATION LIMITATIONS: cleaning, laundry, driving, community activity, and church  PERSONAL FACTORS: Past/current experiences and 3+ comorbidities: osteopenia, Hx of left ankle Fx, s/p R THA on 08/25/2023  are also affecting patient's functional outcome.   REHAB POTENTIAL: Good  CLINICAL DECISION MAKING: Evolving/moderate complexity  EVALUATION COMPLEXITY: Moderate   GOALS: Goals reviewed with patient? Yes  SHORT TERM GOALS: Target date: 10/10/2023 Patient will be independent with initial HEP. Baseline: Goal status: Met  2.  Patient will participate in 3 or 6 minute walk test to establish a baseline. Baseline:  Goal status: Met  LONG TERM GOALS: Target date: 12/12/2023  Patient will be independent with advanced HEP to allow for self progression after discharge. Baseline:  Goal status: Ongoing 5/2  2.  Patient will increase right hip strength to [facility] to allow  her to navigate stairs in her home with reciprocal pattern. Baseline:  Goal status: Ongoing 5/2  3.  Patient will be able to walk for at least 15 minutes with LRAD to allow for return to community ambulation. Baseline:  Goal status: ONGOING 5/2  4.  Patient will improve Lower Extremity Functional Scale to at least 50% to demonstrate improved functional mobility. Baseline: 37.5% Goal status: MET on 12/05/23 - 72.5%  5.  Patient will report ability to resume aquatic aerobics class and going on outings with her friends without increased pain. Baseline:  Goal status: ongoing, 5/2, Pt may not have this as a goal anymore bc classes are at 8:30am   PLAN:  PT FREQUENCY: 2x/week  PT DURATION: 4 weeks  PLANNED INTERVENTIONS: 97164- PT Re-evaluation, 97110-Therapeutic exercises, 97530- Therapeutic activity, 97112- Neuromuscular re-education, 97535- Self Care, [address]- Manual therapy, [id number]- Gait training, [phone]- Aquatic Therapy, 97014- Electrical stimulation (unattended), [phone]- Electrical stimulation (manual), [id number]- Vasopneumatic device, [id number]- Ultrasound, [id number]- Traction (mechanical), [id number]- Ionotophoresis 4mg /ml Dexamethasone , Patient/Family education, Balance training, Stair training, Taping, Dry Needling, Joint mobilization, Joint manipulation, Spinal manipulation, Spinal mobilization, Scar mobilization, Cryotherapy, and Moist heat  PLAN FOR NEXT SESSION:  Plan to extend for 2 weeks to finish out scheduled appts, then should be ready to d/c Pt liked slow, low rep bridge with red loop and single hip clam in supine with red loop Dr. Christiane Cowing cleared Pt via message with Raynell Caller, PT for Pt  to receive DN to lumbar and posterior hip as needed  Raynell Caller, PT 12/12/23 12:12 PM    [facility] [address], [address] [place], [place] [address] Phone # [phone] Fax [phone]

## 2023-12-17 ENCOUNTER — Encounter: Payer: Self-pay | Admitting: Physical Therapy

## 2023-12-17 ENCOUNTER — Ambulatory Visit: Admitting: Physical Therapy

## 2023-12-17 DIAGNOSIS — M6281 Muscle weakness (generalized): Secondary | ICD-10-CM

## 2023-12-17 DIAGNOSIS — R262 Difficulty in walking, not elsewhere classified: Secondary | ICD-10-CM

## 2023-12-17 DIAGNOSIS — R252 Cramp and spasm: Secondary | ICD-10-CM

## 2023-12-17 DIAGNOSIS — R293 Abnormal posture: Secondary | ICD-10-CM

## 2023-12-17 DIAGNOSIS — G8929 Other chronic pain: Secondary | ICD-10-CM

## 2023-12-17 DIAGNOSIS — M25551 Pain in right hip: Secondary | ICD-10-CM

## 2023-12-17 NOTE — Therapy (Signed)
 OUTPATIENT PHYSICAL THERAPY TREATMENT NOTE    Patient Name: Linda [name] MRN: [id number] DOB:1941-01-25, 83 y.o., female Today's Date: 12/17/2023   END OF SESSION:  PT End of Session - 12/17/23 0929     Visit Number 21    Date for PT Re-Evaluation 01/12/24    Authorization Type BC/BS Medicare    Progress Note Due on Visit 28    PT Start Time 0800    PT Stop Time 0845    PT Time Calculation (min) 45 min    Activity Tolerance Patient tolerated treatment well    Behavior During Therapy WFL for tasks assessed/performed               Past Medical History:  Diagnosis Date   ALLERGIC RHINITIS    Allergy    ANEMIA-IRON DEFICIENCY    Anxiety state, unspecified    Carpal tunnel syndrome    pt denies    Cataract    Closed left ankle fracture    COLONIC POLYPS, HX OF 2007   GERD    GLAUCOMA    Glaucoma    GOITER, MULTINODULAR    on US  2006, unchanged 6/13 with nodules all <63mm   HIATAL HERNIA WITH REFLUX    Hip dysplasia, acquired    B sx; eval at [facility] for same 01/2015 -ongoing PT   OSTEOARTHRITIS    Osteoporosis    Serrated polyp of colon    TOBACCO USE, QUIT    Past Surgical History:  Procedure Laterality Date   BREAST SURGERY  1960   Left breast cyst removed no complications   CARPAL TUNNEL RELEASE Right    COLONOSCOPY  2005   EYE SURGERY  1991-1992   both eyes   EYE SURGERY Left 2014   shunt behind left eye   ORIF FIBULA FRACTURE Left 08/24/2019   Procedure: OPEN REDUCTION INTERNAL FIXATION (ORIF) LEFT DISTAL FIBULA FRACTURE;  Surgeon: Osa Blase, MD;  Location: [facility];  Service: Orthopedics;  Laterality: Left;   Right shoulder aurgery  10/2008   TOTAL HIP ARTHROPLASTY Right 08/25/2023   Procedure: RIGHT TOTAL HIP REPLACEMENT;  Surgeon: Wes Hamman, MD;  Location: [facility] OR;  Service: Orthopedics;  Laterality: Right;  3-C   Patient Active Problem List   Diagnosis Date Noted   Status post total replacement of right hip 08/25/2023    Degenerative joint disease of right hip 08/24/2023   Spinal stenosis of lumbar region 10/09/2022   Facet arthritis of lumbar region 10/09/2022   Chronic bilateral low back pain without sciatica 10/04/2022   Osteopenia 11/26/2021   Pre-diabetes 11/26/2021   Other fatigue 07/28/2020   Dupuytren's disease of palm 01/19/2018   Viral URI 01/23/2017   Bilateral sensorineural hearing loss 03/12/2016   Routine general medical examination at a health care facility 07/22/2015   Stress fracture of neck of right femur 01/25/2015   Anemia 11/06/2012   Anxiety 11/06/2012   Depression 11/06/2012   Cataract, nuclear 10/28/2012   Primary open angle glaucoma of both eyes, indeterminate stage 10/28/2012   Ptosis of eyelid 10/28/2012   Bladder prolapse, female, acquired 01/09/2012   GERD (gastroesophageal reflux disease) 05/21/2007    PCP: Adelia Homestead, MD  REFERRING PROVIDER: Sandie Cross, PA-C  REFERRING DIAG: [id number] (ICD-10-CM) - Status post total replacement of right hip  THERAPY DIAG:  Difficulty in walking, not elsewhere classified  Muscle weakness (generalized)  Cramp and spasm  Pain in right hip  Chronic bilateral low back pain  without sciatica  Abnormal posture  Rationale for Evaluation and Treatment: Rehabilitation  ONSET DATE: S/P Right THA on 08/25/2023  SUBJECTIVE:   SUBJECTIVE STATEMENT: I may have pushed myself a little too much over the last few days, my back is cranky and I am tired.    PERTINENT HISTORY: Hx of back pain, R THA on 08/25/23, OA, Hx of Left ankle fracture in 2021, osteopenia  PAIN:  PAIN:  Are you having pain? Yes NPRS scale: 3-4/10 Pain location:  back Pain description: intermittent and aching  Aggravating factors: sitting and then need to stand, going up the steps Relieving factors: rest   PRECAUTIONS: None  RED FLAGS: None   WEIGHT BEARING RESTRICTIONS: No  FALLS:  Has patient fallen in last 6 months? Yes. Number of  falls 1 fall before surgery when she missed a step and fell at her daughter's home  LIVING ENVIRONMENT: Lives with: lives alone Lives in: House/apartment Stairs:  two story home Has following equipment at home: Single point cane, Environmental consultant - 2 wheeled, shower chair, and bed side commode  OCCUPATION: Retired  PLOF: Independent and Vocation/Vocational requirements: water aerobics, church activities  PATIENT GOALS: To be independent again and return to driving and community activities.  NEXT MD VISIT: Osteoporosis Clinic with [name], PA on 09/22/23  OBJECTIVE:  Note: Objective measures were completed at Evaluation unless otherwise noted.  DIAGNOSTIC FINDINGS:  Pelvic Radiograph on 08/25/2023: IMPRESSION: Right hip arthroplasty without immediate postoperative complication.  PATIENT SURVEYS:   Eval:  LEFS 30 / 80 = 37.5 % 12/05/2023:  Lower Extremity Functional Score: 58 / 80 = 72.5 %  COGNITION: Overall cognitive status: Within functional limits for tasks assessed     SENSATION: Patient states some numbness at incision site.  MUSCLE LENGTH: Hamstrings: Right tightness noted  POSTURE: rounded shoulders and forward head  PALPATION: Tender to palpation along incision site  LOWER EXTREMITY ROM:  WFL  LOWER EXTREMITY MMT: 12/12/23: [name]: Quad 4+/5 Hip ext 4/5 Hip abd 4/5  10/31/23: Rt hip extension 4/5 Rt hip flexion 4/5 with anterior hip pain Rt hip abduction 4/5 Rt Knee 5/5   Eval:   Right hip strength grossly 4 to 4+/5 Right quad and hamstring strength of 5-/5 Left LE strength is WFL   FUNCTIONAL TESTS:  Eval: 5 times sit to stand: 21.66 with required use of UE pushing chair handles Timed up and go (TUG): 20.66 sec with RW  09/26/23:  3 minute walk: covered 66' with gait belt and no AD  10/17/2023: 3 minute walk:  464 ft with [facility]  10/31/2023: 3 minute walk:  445 ft with SPC 5 X STS: 14.89 with light use of hands on thighs TUG: 15.90 with SPC,  15.10  12/05/2023: 3 minute walk: 632 ft without assistive device Timed up and go (TUG): 10.74 sec  12/12/2023: Stairs: reciprocal pattern with good quad and glute activation and light use of bil rail, no momentum used to step up onto Rt side    GAIT: Distance walked: >200 ft Assistive device utilized: Environmental consultant - 2 wheeled Level of assistance: Modified independence Comments: Pt with antalgic gait pattern  TODAY'S TREATMENT   12/17/23:Pt arrives for aquatic physical therapy. Treatment took place in 3.5-5.5 feet of water. Water temperature was 91 degrees F Pt entered the pool via stairs reciprocally but slow. Seated water bench with 75% submersion Pt performed seated LE AROM exercises 20x in all planes, concurrent discussion of her current status.Pt requires buoyancy of water for support and to offload joints with strengthening exercises.   75% depth water walking with UE push/pull with UE floats 10x each direction. High knee marching holding the small noodle 6x. Bil hip kicks 3 ways 20x each holding on for balance adding ankle fins today for extra drag..Step ups forward 10x 2 sideways 10x2: first set with UE, 2nd set without UE.Aaron Aas Heel lifts 15x.2  Standing hip Ext rot 10x Bil holding onto wall. Standing lunge stretch on second step for Rt hip flexor 3x 20 sec. Seated decompression float to reduce back pan and RTLE fatigue and instability from the fatigue.  12/12/23: NuStep L5 x 10' PT present to go over goals PT sent message to surgeon to see if he would clear [name] for DN to lumbar spine - (he wrote back after session completed and cleared her for this next time) Reciprocal stairs with bil light rail 1 round Rt fwd step up 6" bil rail x10 Rt lateral step up 6" single rail x10 Leg press bil 55lb x10, 65lb x10. Rt single leg press 30lb x8 Sit to stand: no UE assist, using  forward bil UE reach x10 Supine hooklying red loop Rt clam x8 slow and controlled - good awareness of Rt hip musculature Supine bridge with red loop hip abd x5 slow and controlled - good awareness of Rt hip musculature  12/10/23:Pt arrives for aquatic physical therapy. Treatment took place in 3.5-5.5 feet of water. Water temperature was 91 degrees F Pt entered the pool via stairs reciprocally but slow. Seated water bench with 75% submersion Pt performed seated LE AROM exercises 20x in all planes, concurrent discussion of her current status.Pt requires buoyancy of water for support and to offload joints with strengthening exercises.   75% depth water walking with UE push/pull with UE floats 10x each direction. High knee marching holding the small noodle 6x. Bil hip kicks 3 ways 20x each holding on for balance adding ankle fins today for extra drag..Step ups forward 10x 2 sideways 10x2 done today with no UE. Heel lifts 15x.2  Standing hip Ext rot 10x Bil holding onto wall. Standing lunge stretch on second step for Rt hip flexor 3x 20 sec. Underwater bicycle on horseback 10 min.   PATIENT EDUCATION:  Education details: Issued HEP Person educated: Patient Education method: Explanation, Facilities manager, and Handouts Education comprehension: verbalized understanding and returned demonstration  HOME EXERCISE PROGRAM: Access Code: [email or web address] URL: [email or web address] Date: 12/12/2023 Prepared by: Raynell Caller  Exercises - Standing March with Counter Support  - 1 x daily - 7 x weekly - 2 sets - 10 reps - Standing Hip Abduction with Counter Support  - 1 x daily - 7 x weekly - 2 sets - 10 reps - Standing Hip Extension with Counter Support  - 1 x daily - 7 x weekly - 2 sets - 10 reps - Mini Squat with Counter Support  - 1 x daily - 7 x weekly - 2 sets - 10 reps - Heel Raises with Counter Support  - 1 x daily - 7 x weekly - 2 sets - 10 reps - Side Stepping with Counter Support  - 1 x  daily - 7 x weekly - 3 sets - 10 reps - Supine Hip Adduction Isometric with Ball  - 1 x daily - 7 x weekly - 1 sets - 10 reps - 5 hold - Sit to Stand  - 3 x daily - 7 x weekly - 1 sets - 10 reps - Lateral Step Up  - 1 x daily - 7 x weekly - 1 sets - 10 reps - Forward Step Up  - 1 x daily - 7 x weekly - 1 sets - 10 reps - Forward Step Down  - 1 x daily - 7 x weekly - 1 sets - 10 reps - Bridge with Hip Abduction and Resistance  - 1 x daily - 7 x weekly - 2 sets - 5 reps  ASSESSMENT:  CLINICAL IMPRESSION: Fatigue and back pain present for todays session. Pt believes it is a result of over doing her daily activities the last few days. Pt had a difficult time with stability when the water was moving more due to other people near her in the water. Fatigue main barrier today performing step ups with no UE support.   OBJECTIVE IMPAIRMENTS: decreased balance, decreased mobility, difficulty walking, decreased strength, increased muscle spasms, impaired flexibility, and pain.   ACTIVITY LIMITATIONS: lifting, bending, standing, squatting, sleeping, and stairs  PARTICIPATION LIMITATIONS: cleaning, laundry, driving, community activity, and church  PERSONAL FACTORS: Past/current experiences and 3+ comorbidities: osteopenia, Hx of left ankle Fx, s/p R THA on 08/25/2023  are also affecting patient's functional outcome.   REHAB POTENTIAL: Good  CLINICAL DECISION MAKING: Evolving/moderate complexity  EVALUATION COMPLEXITY: Moderate   GOALS: Goals reviewed with patient? Yes  SHORT TERM GOALS: Target date: 10/10/2023 Patient will be independent with initial HEP. Baseline: Goal status: Met  2.  Patient will participate in 3 or 6 minute walk test to establish a baseline. Baseline:  Goal status: Met  LONG TERM GOALS: Target date: 12/12/2023  Patient will be independent with advanced HEP to allow for self progression after discharge. Baseline:  Goal status: Ongoing 5/2  2.  Patient will increase  right hip strength to [facility] to allow her to navigate stairs in her home with reciprocal pattern. Baseline:  Goal status: Ongoing 5/2  3.  Patient will be able to walk for at least 15 minutes with LRAD to allow for return to community ambulation. Baseline:  Goal status: ONGOING 5/2  4.  Patient will improve Lower Extremity Functional Scale to at least 50% to demonstrate improved functional mobility. Baseline: 37.5% Goal status: MET on 12/05/23 - 72.5%  5.  Patient will report ability to resume aquatic aerobics class and going on outings with her friends without increased pain. Baseline:  Goal status: ongoing, 5/2, Pt may not have this as a goal anymore bc classes are at 8:30am   PLAN:  PT FREQUENCY: 2x/week  PT DURATION: 4 weeks  PLANNED INTERVENTIONS: 97164- PT Re-evaluation, 97110-Therapeutic exercises, 97530- Therapeutic activity, 97112- Neuromuscular re-education, 97535- Self Care, 16109- Manual therapy, [id number]- Gait training, [phone]- Aquatic Therapy, 97014- Electrical stimulation (unattended), [id number]- Electrical stimulation (manual), [id number]- Vasopneumatic device, [id number]- Ultrasound, [id number]- Traction (mechanical), [id number]- Ionotophoresis 4mg /ml Dexamethasone , Patient/Family education, Balance training, Stair training, Taping, Dry Needling, Joint mobilization, Joint manipulation, Spinal manipulation, Spinal mobilization, Scar mobilization, Cryotherapy, and Moist heat  PLAN FOR NEXT SESSION:  Plan to extend for 2 weeks to finish out scheduled appts, then should be ready to d/c Pt liked slow, low rep bridge with red loop and single hip clam in  supine with red loop Dr. Christiane Cowing cleared Pt via message with Raynell Caller, PT for Pt to receive DN to lumbar and posterior hip as needed  Bethanne Brooks, PTA 12/17/23 9:31 AM    [facility] [address], [address] [place], [place] [address] Phone # [phone] Fax [phone]

## 2023-12-19 ENCOUNTER — Encounter: Payer: Self-pay | Admitting: Rehabilitative and Restorative Service Providers"

## 2023-12-19 ENCOUNTER — Ambulatory Visit: Admitting: Rehabilitative and Restorative Service Providers"

## 2023-12-19 DIAGNOSIS — R252 Cramp and spasm: Secondary | ICD-10-CM

## 2023-12-19 DIAGNOSIS — R293 Abnormal posture: Secondary | ICD-10-CM

## 2023-12-19 DIAGNOSIS — R262 Difficulty in walking, not elsewhere classified: Secondary | ICD-10-CM

## 2023-12-19 DIAGNOSIS — G8929 Other chronic pain: Secondary | ICD-10-CM

## 2023-12-19 DIAGNOSIS — M6281 Muscle weakness (generalized): Secondary | ICD-10-CM

## 2023-12-19 DIAGNOSIS — M25551 Pain in right hip: Secondary | ICD-10-CM

## 2023-12-19 NOTE — Therapy (Signed)
 OUTPATIENT PHYSICAL THERAPY TREATMENT NOTE    Patient Name: Linda [name] MRN: [id number] DOB:Dec 17, 1940, 83 y.o., female Today's Date: 12/19/2023   END OF SESSION:  PT End of Session - 12/19/23 1021     Visit Number 22    Date for PT Re-Evaluation 01/12/24    Authorization Type BC/BS Medicare    Progress Note Due on Visit 28    PT Start Time 1017    PT Stop Time 1058    PT Time Calculation (min) 41 min    Activity Tolerance Patient tolerated treatment well    Behavior During Therapy WFL for tasks assessed/performed               Past Medical History:  Diagnosis Date   ALLERGIC RHINITIS    Allergy    ANEMIA-IRON DEFICIENCY    Anxiety state, unspecified    Carpal tunnel syndrome    pt denies    Cataract    Closed left ankle fracture    COLONIC POLYPS, HX OF 2007   GERD    GLAUCOMA    Glaucoma    GOITER, MULTINODULAR    on US  2006, unchanged 6/13 with nodules all <19mm   HIATAL HERNIA WITH REFLUX    Hip dysplasia, acquired    B sx; eval at [facility] for same 01/2015 -ongoing PT   OSTEOARTHRITIS    Osteoporosis    Serrated polyp of colon    TOBACCO USE, QUIT    Past Surgical History:  Procedure Laterality Date   BREAST SURGERY  1960   Left breast cyst removed no complications   CARPAL TUNNEL RELEASE Right    COLONOSCOPY  2005   EYE SURGERY  1991-1992   both eyes   EYE SURGERY Left 2014   shunt behind left eye   ORIF FIBULA FRACTURE Left 08/24/2019   Procedure: OPEN REDUCTION INTERNAL FIXATION (ORIF) LEFT DISTAL FIBULA FRACTURE;  Surgeon: Osa Blase, MD;  Location: [facility];  Service: Orthopedics;  Laterality: Left;   Right shoulder aurgery  10/2008   TOTAL HIP ARTHROPLASTY Right 08/25/2023   Procedure: RIGHT TOTAL HIP REPLACEMENT;  Surgeon: Wes Hamman, MD;  Location: [facility] OR;  Service: Orthopedics;  Laterality: Right;  3-C   Patient Active Problem List   Diagnosis Date Noted   Status post total replacement of right hip 08/25/2023    Degenerative joint disease of right hip 08/24/2023   Spinal stenosis of lumbar region 10/09/2022   Facet arthritis of lumbar region 10/09/2022   Chronic bilateral low back pain without sciatica 10/04/2022   Osteopenia 11/26/2021   Pre-diabetes 11/26/2021   Other fatigue 07/28/2020   Dupuytren's disease of palm 01/19/2018   Viral URI 01/23/2017   Bilateral sensorineural hearing loss 03/12/2016   Routine general medical examination at a health care facility 07/22/2015   Stress fracture of neck of right femur 01/25/2015   Anemia 11/06/2012   Anxiety 11/06/2012   Depression 11/06/2012   Cataract, nuclear 10/28/2012   Primary open angle glaucoma of both eyes, indeterminate stage 10/28/2012   Ptosis of eyelid 10/28/2012   Bladder prolapse, female, acquired 01/09/2012   GERD (gastroesophageal reflux disease) 05/21/2007    PCP: Adelia Homestead, MD  REFERRING PROVIDER: Sandie Cross, PA-C  REFERRING DIAG: [phone] (ICD-10-CM) - Status post total replacement of right hip  THERAPY DIAG:  Difficulty in walking, not elsewhere classified  Muscle weakness (generalized)  Cramp and spasm  Pain in right hip  Chronic bilateral low back pain  without sciatica  Abnormal posture  Rationale for Evaluation and Treatment: Rehabilitation  ONSET DATE: S/P Right THA on 08/25/2023  SUBJECTIVE:   SUBJECTIVE STATEMENT:  Patient states that her back pain went up to a 6-7/10 after planting flowers.  States that her hip (pain 2-3/10) is about the same.  Patient states that the knot on her anterior hip where seroma was drained seems to be coming back.    PERTINENT HISTORY: Hx of back pain, R THA on 08/25/23, OA, Hx of Left ankle fracture in 2021, osteopenia  PAIN:  PAIN:  Are you having pain? Yes NPRS scale: 4/10 Pain location:  back Pain description: intermittent and aching  Aggravating factors: sitting and then need to stand, going up the steps Relieving factors:  rest   PRECAUTIONS: None  RED FLAGS: None   WEIGHT BEARING RESTRICTIONS: No  FALLS:  Has patient fallen in last 6 months? Yes. Number of falls 1 fall before surgery when she missed a step and fell at her daughter's home  LIVING ENVIRONMENT: Lives with: lives alone Lives in: House/apartment Stairs: two story home Has following equipment at home: Single point cane, Environmental consultant - 2 wheeled, shower chair, and bed side commode  OCCUPATION: Retired  PLOF: Independent and Vocation/Vocational requirements: water aerobics, church activities  PATIENT GOALS: To be independent again and return to driving and community activities.  NEXT MD VISIT: Osteoporosis Clinic with [name], PA on 09/22/23  OBJECTIVE:  Note: Objective measures were completed at Evaluation unless otherwise noted.  DIAGNOSTIC FINDINGS:  Pelvic Radiograph on 08/25/2023: IMPRESSION: Right hip arthroplasty without immediate postoperative complication.  PATIENT SURVEYS:   Eval:  LEFS 30 / 80 = 37.5 % 12/05/2023:  Lower Extremity Functional Score: 58 / 80 = 72.5 %  COGNITION: Overall cognitive status: Within functional limits for tasks assessed     SENSATION: Patient states some numbness at incision site.  MUSCLE LENGTH: Hamstrings: Right tightness noted  POSTURE: rounded shoulders and forward head  PALPATION: Tender to palpation along incision site  LOWER EXTREMITY ROM:  WFL  LOWER EXTREMITY MMT: 12/12/23: [name]: Quad 4+/5 Hip ext 4/5 Hip abd 4/5  10/31/23: Rt hip extension 4/5 Rt hip flexion 4/5 with anterior hip pain Rt hip abduction 4/5 Rt Knee 5/5   Eval:   Right hip strength grossly 4 to 4+/5 Right quad and hamstring strength of 5-/5 Left LE strength is WFL   FUNCTIONAL TESTS:  Eval: 5 times sit to stand: 21.66 with required use of UE pushing chair handles Timed up and go (TUG): 20.66 sec with RW  09/26/23:  3 minute walk: covered 40' with gait belt and no AD  10/17/2023: 3  minute walk:  464 ft with [facility]  10/31/2023: 3 minute walk:  445 ft with SPC 5 X STS: 14.89 with light use of hands on thighs TUG: 15.90 with SPC, 15.10  12/05/2023: 3 minute walk: 632 ft without assistive device Timed up and go (TUG): 10.74 sec  12/12/2023: Stairs: reciprocal pattern with good quad and glute activation and light use of bil rail, no momentum used to step up onto Rt side    GAIT: Distance walked: >200 ft Assistive device utilized: Environmental consultant - 2 wheeled Level of assistance: Modified independence Comments: Pt with antalgic gait pattern  TODAY'S TREATMENT   12/19/2023: Nustep level 6 x7 min with PT present to discuss status Seated hamstring stretch 2x20 sec bilat Supine hooklying red loop Rt clam x8 slow and controlled - good awareness of Rt hip musculature Supine bridge with red loop hip abd x5 slow and controlled - good awareness of Rt hip musculature Supine marching with red loop around knees x10 bilat Sit to stand: no UE assist, using forward bil UE reach x10 Trigger Point Dry Needling Initial Treatment: Pt instructed on Dry Needling rational, procedures, and possible side effects. Pt instructed to expect mild to moderate muscle soreness later in the day and/or into the next day.  Pt instructed in methods to reduce muscle soreness. Pt instructed to continue prescribed HEP. Because Dry Needling was performed over or adjacent to a lung field, pt was educated on S/S of pneumothorax and to seek immediate medical attention should they occur.  Patient was educated on signs and symptoms of infection and other risk factors and advised to seek medical attention should they occur.  Patient verbalized understanding of these instructions and education.  Patient Verbal Consent Given: Yes Education Handout Provided: Yes Muscles Treated: bilat lumbar  multifidi Electrical Stimulation Performed: No Treatment Response/Outcome: Utilized skilled palpation to identify bony landmarks and trigger points.  Able to illicit twitch response and muscle elongation.  Soft tissue mobilization following to further promote tissue elongation.    12/17/23:Pt arrives for aquatic physical therapy. Treatment took place in 3.5-5.5 feet of water. Water temperature was 91 degrees F Pt entered the pool via stairs reciprocally but slow. Seated water bench with 75% submersion Pt performed seated LE AROM exercises 20x in all planes, concurrent discussion of her current status.Pt requires buoyancy of water for support and to offload joints with strengthening exercises.   75% depth water walking with UE push/pull with UE floats 10x each direction. High knee marching holding the small noodle 6x. Bil hip kicks 3 ways 20x each holding on for balance adding ankle fins today for extra drag..Step ups forward 10x 2 sideways 10x2: first set with UE, 2nd set without UE.Linda [name] Heel lifts 15x.2  Standing hip Ext rot 10x Bil holding onto wall. Standing lunge stretch on second step for Rt hip flexor 3x 20 sec. Seated decompression float to reduce back pan and RTLE fatigue and instability from the fatigue.  12/12/23: NuStep L5 x 10' PT present to go over goals PT sent message to surgeon to see if he would clear Linda [name] for DN to lumbar spine - (he wrote back after session completed and cleared her for this next time) Reciprocal stairs with bil light rail 1 round Rt fwd step up 6" bil rail x10 Rt lateral step up 6" single rail x10 Leg press bil 55lb x10, 65lb x10. Rt single leg press 30lb x8 Sit to stand: no UE assist, using forward bil UE reach x10 Supine hooklying red loop Rt clam x8 slow and controlled - good awareness of Rt hip musculature Supine bridge with red loop hip abd x5 slow and controlled - good awareness of Rt hip musculature   PATIENT EDUCATION:  Education details: Issued  HEP Person educated: Patient Education method: Explanation, Demonstration, and Handouts Education comprehension: verbalized understanding and returned demonstration  HOME EXERCISE PROGRAM: Access Code: [email or web address] URL: [email or web address] Date: 12/12/2023 Prepared by: Linda [name]  Exercises - Standing March with Counter Support  - 1 x daily - 7 x weekly - 2 sets - 10 reps - Standing Hip Abduction with Counter Support  -  1 x daily - 7 x weekly - 2 sets - 10 reps - Standing Hip Extension with Counter Support  - 1 x daily - 7 x weekly - 2 sets - 10 reps - Mini Squat with Counter Support  - 1 x daily - 7 x weekly - 2 sets - 10 reps - Heel Raises with Counter Support  - 1 x daily - 7 x weekly - 2 sets - 10 reps - Side Stepping with Counter Support  - 1 x daily - 7 x weekly - 3 sets - 10 reps - Supine Hip Adduction Isometric with Ball  - 1 x daily - 7 x weekly - 1 sets - 10 reps - 5 hold - Sit to Stand  - 3 x daily - 7 x weekly - 1 sets - 10 reps - Lateral Step Up  - 1 x daily - 7 x weekly - 1 sets - 10 reps - Forward Step Up  - 1 x daily - 7 x weekly - 1 sets - 10 reps - Forward Step Down  - 1 x daily - 7 x weekly - 1 sets - 10 reps - Bridge with Hip Abduction and Resistance  - 1 x daily - 7 x weekly - 2 sets - 5 reps  ASSESSMENT:  CLINICAL IMPRESSION:  Received notification via MyChart message from Dr Linda [name] that it was okay to initiate dry needling services.  Patient able to progress with strengthening exercises during session.  Continued to do well with supine exercises with red resistance loop.  Patient with great twitch response noted with dry needling with increased trigger points noted on right side compared to left.  Performed soft tissue mobilization to bilateral lumbar paraspinals at end of session to promote increased blood flow and promote tissue elasticity.  Patient continues to require skilled PT to progress towards goal related activities.  OBJECTIVE IMPAIRMENTS:  decreased balance, decreased mobility, difficulty walking, decreased strength, increased muscle spasms, impaired flexibility, and pain.   ACTIVITY LIMITATIONS: lifting, bending, standing, squatting, sleeping, and stairs  PARTICIPATION LIMITATIONS: cleaning, laundry, driving, community activity, and church  PERSONAL FACTORS: Past/current experiences and 3+ comorbidities: osteopenia, Hx of left ankle Fx, s/p R THA on 08/25/2023 are also affecting patient's functional outcome.   REHAB POTENTIAL: Good  CLINICAL DECISION MAKING: Evolving/moderate complexity  EVALUATION COMPLEXITY: Moderate   GOALS: Goals reviewed with patient? Yes  SHORT TERM GOALS: Target date: 10/10/2023 Patient will be independent with initial HEP. Baseline: Goal status: Met  2.  Patient will participate in 3 or 6 minute walk test to establish a baseline. Baseline:  Goal status: Met  LONG TERM GOALS: Target date: 12/12/2023  Patient will be independent with advanced HEP to allow for self progression after discharge. Baseline:  Goal status: Ongoing 5/2  2.  Patient will increase right hip strength to [facility] to allow her to navigate stairs in her home with reciprocal pattern. Baseline:  Goal status: Ongoing 5/2  3.  Patient will be able to walk for at least 15 minutes with LRAD to allow for return to community ambulation. Baseline:  Goal status: ONGOING 5/2  4.  Patient will improve Lower Extremity Functional Scale to at least 50% to demonstrate improved functional mobility. Baseline: 37.5% Goal status: MET on 12/05/23 - 72.5%  5.  Patient will report ability to resume aquatic aerobics class and going on outings with her friends without increased pain. Baseline:  Goal status: ongoing, 5/2, Pt may not have this as a goal  anymore bc classes are at 8:30am   PLAN:  PT FREQUENCY: 2x/week  PT DURATION: 4 weeks  PLANNED INTERVENTIONS: 97164- PT Re-evaluation, 97110-Therapeutic exercises, 97530- Therapeutic  activity, [id number]- Neuromuscular re-education, 97535- Self Care, [id number]- Manual therapy, [id number]- Gait training, [phone]- Aquatic Therapy, 97014- Electrical stimulation (unattended), [id number]- Electrical stimulation (manual), [id number]- Vasopneumatic device, [id number]- Ultrasound, [id number]- Traction (mechanical), [id number]- Ionotophoresis 4mg /ml Dexamethasone , Patient/Family education, Balance training, Stair training, Taping, Dry Needling, Joint mobilization, Joint manipulation, Spinal manipulation, Spinal mobilization, Scar mobilization, Cryotherapy, and Moist heat  PLAN FOR NEXT SESSION:  Assess response to dry needling, aquatics PT. Dr. Christiane [name] cleared Pt via message with Linda [name], PT for Pt to receive DN to lumbar and posterior hip as needed   Linda [name], PT, DPT 12/19/23, 11:07 AM  [facility] [address] [place], [place] [address] Phone # [phone] Fax [phone]

## 2023-12-24 ENCOUNTER — Ambulatory Visit: Admitting: Physical Therapy

## 2023-12-24 ENCOUNTER — Encounter: Payer: Self-pay | Admitting: Physical Therapy

## 2023-12-24 DIAGNOSIS — R293 Abnormal posture: Secondary | ICD-10-CM

## 2023-12-24 DIAGNOSIS — M25551 Pain in right hip: Secondary | ICD-10-CM

## 2023-12-24 DIAGNOSIS — R252 Cramp and spasm: Secondary | ICD-10-CM

## 2023-12-24 DIAGNOSIS — R262 Difficulty in walking, not elsewhere classified: Secondary | ICD-10-CM | POA: Diagnosis not present

## 2023-12-24 DIAGNOSIS — M6281 Muscle weakness (generalized): Secondary | ICD-10-CM

## 2023-12-24 DIAGNOSIS — G8929 Other chronic pain: Secondary | ICD-10-CM

## 2023-12-24 NOTE — Therapy (Signed)
 OUTPATIENT PHYSICAL THERAPY TREATMENT NOTE    Patient Name: Linda [name] MRN: [id number] DOB:08-Oct-1940, 83 y.o., female Today's Date: 12/24/2023   END OF SESSION:  PT End of Session - 12/24/23 1021     Visit Number 23    Date for PT Re-Evaluation 01/12/24    Authorization Type BC/BS Medicare    Progress Note Due on Visit 28    PT Start Time 0845    PT Stop Time 0930    PT Time Calculation (min) 45 min    Activity Tolerance Patient tolerated treatment well    Behavior During Therapy WFL for tasks assessed/performed                Past Medical History:  Diagnosis Date   ALLERGIC RHINITIS    Allergy    ANEMIA-IRON DEFICIENCY    Anxiety state, unspecified    Carpal tunnel syndrome    pt denies    Cataract    Closed left ankle fracture    COLONIC POLYPS, HX OF 2007   GERD    GLAUCOMA    Glaucoma    GOITER, MULTINODULAR    on US  2006, unchanged 6/13 with nodules all <95mm   HIATAL HERNIA WITH REFLUX    Hip dysplasia, acquired    B sx; eval at [facility] for same 01/2015 -ongoing PT   OSTEOARTHRITIS    Osteoporosis    Serrated polyp of colon    TOBACCO USE, QUIT    Past Surgical History:  Procedure Laterality Date   BREAST SURGERY  1960   Left breast cyst removed no complications   CARPAL TUNNEL RELEASE Right    COLONOSCOPY  2005   EYE SURGERY  1991-1992   both eyes   EYE SURGERY Left 2014   shunt behind left eye   ORIF FIBULA FRACTURE Left 08/24/2019   Procedure: OPEN REDUCTION INTERNAL FIXATION (ORIF) LEFT DISTAL FIBULA FRACTURE;  Surgeon: Osa Blase, MD;  Location: [facility];  Service: Orthopedics;  Laterality: Left;   Right shoulder aurgery  10/2008   TOTAL HIP ARTHROPLASTY Right 08/25/2023   Procedure: RIGHT TOTAL HIP REPLACEMENT;  Surgeon: Wes Hamman, MD;  Location: [facility] OR;  Service: Orthopedics;  Laterality: Right;  3-C   Patient Active Problem List   Diagnosis Date Noted   Status post total replacement of right hip 08/25/2023    Degenerative joint disease of right hip 08/24/2023   Spinal stenosis of lumbar region 10/09/2022   Facet arthritis of lumbar region 10/09/2022   Chronic bilateral low back pain without sciatica 10/04/2022   Osteopenia 11/26/2021   Pre-diabetes 11/26/2021   Other fatigue 07/28/2020   Dupuytren's disease of palm 01/19/2018   Viral URI 01/23/2017   Bilateral sensorineural hearing loss 03/12/2016   Routine general medical examination at a health care facility 07/22/2015   Stress fracture of neck of right femur 01/25/2015   Anemia 11/06/2012   Anxiety 11/06/2012   Depression 11/06/2012   Cataract, nuclear 10/28/2012   Primary open angle glaucoma of both eyes, indeterminate stage 10/28/2012   Ptosis of eyelid 10/28/2012   Bladder prolapse, female, acquired 01/09/2012   GERD (gastroesophageal reflux disease) 05/21/2007    PCP: Adelia Homestead, MD  REFERRING PROVIDER: Sandie Cross, PA-C  REFERRING DIAG: [phone] (ICD-10-CM) - Status post total replacement of right hip  THERAPY DIAG:  Difficulty in walking, not elsewhere classified  Muscle weakness (generalized)  Cramp and spasm  Pain in right hip  Chronic bilateral low back  pain without sciatica  Abnormal posture  Rationale for Evaluation and Treatment: Rehabilitation  ONSET DATE: S/P Right THA on 08/25/2023  SUBJECTIVE:   SUBJECTIVE STATEMENT: DN was good. Got some short lived relief. Hip is doing well. Im still afraid of the stairs.   PERTINENT HISTORY: Hx of back pain, R THA on 08/25/23, OA, Hx of Left ankle fracture in 2021, osteopenia  PAIN:  PAIN:  Are you having pain? Yes NPRS scale: 4/10 Pain location:  back Pain description: intermittent and aching  Aggravating factors: sitting and then need to stand, going up the steps Relieving factors: rest   PRECAUTIONS: None  RED FLAGS: None   WEIGHT BEARING RESTRICTIONS: No  FALLS:  Has patient fallen in last 6 months? Yes. Number of falls 1 fall  before surgery when she missed a step and fell at her daughter's home  LIVING ENVIRONMENT: Lives with: lives alone Lives in: House/apartment Stairs: two story home Has following equipment at home: Single point cane, Environmental consultant - 2 wheeled, shower chair, and bed side commode  OCCUPATION: Retired  PLOF: Independent and Vocation/Vocational requirements: water aerobics, church activities  PATIENT GOALS: To be independent again and return to driving and community activities.  NEXT MD VISIT: Osteoporosis Clinic with [name], PA on 09/22/23  OBJECTIVE:  Note: Objective measures were completed at Evaluation unless otherwise noted.  DIAGNOSTIC FINDINGS:  Pelvic Radiograph on 08/25/2023: IMPRESSION: Right hip arthroplasty without immediate postoperative complication.  PATIENT SURVEYS:   Eval:  LEFS 30 / 80 = 37.5 % 12/05/2023:  Lower Extremity Functional Score: 58 / 80 = 72.5 %  COGNITION: Overall cognitive status: Within functional limits for tasks assessed     SENSATION: Patient states some numbness at incision site.  MUSCLE LENGTH: Hamstrings: Right tightness noted  POSTURE: rounded shoulders and forward head  PALPATION: Tender to palpation along incision site  LOWER EXTREMITY ROM:  WFL  LOWER EXTREMITY MMT: 12/12/23: [name]: Quad 4+/5 Hip ext 4/5 Hip abd 4/5  10/31/23: Rt hip extension 4/5 Rt hip flexion 4/5 with anterior hip pain Rt hip abduction 4/5 Rt Knee 5/5   Eval:   Right hip strength grossly 4 to 4+/5 Right quad and hamstring strength of 5-/5 Left LE strength is WFL   FUNCTIONAL TESTS:  Eval: 5 times sit to stand: 21.66 with required use of UE pushing chair handles Timed up and go (TUG): 20.66 sec with RW  09/26/23:  3 minute walk: covered 75' with gait belt and no AD  10/17/2023: 3 minute walk:  464 ft with [facility]  10/31/2023: 3 minute walk:  445 ft with SPC 5 X STS: 14.89 with light use of hands on thighs TUG: 15.90 with SPC,  15.10  12/05/2023: 3 minute walk: 632 ft without assistive device Timed up and go (TUG): 10.74 sec  12/12/2023: Stairs: reciprocal pattern with good quad and glute activation and light use of bil rail, no momentum used to step up onto Rt side    GAIT: Distance walked: >200 ft Assistive device utilized: Environmental consultant - 2 wheeled Level of assistance: Modified independence Comments: Pt with antalgic gait pattern  TODAY'S TREATMENT   12/24/23:Pt arrives for aquatic physical therapy. Treatment took place in 3.5-5.5 feet of water. Water temperature was 91 degrees F Pt entered the pool via stairs reciprocally but slow. Seated water bench with 75% submersion Pt performed seated LE AROM exercises 20x in all planes, concurrent discussion of her current status.Pt requires buoyancy of water for support and to offload joints with strengthening exercises.   75% depth water walking with UE push/pull with UE floats 10x each direction. High knee marching holding the small noodle 6x. Bil hip kicks 3 ways 20x each holding on for balance VC to push a little faster..Step ups forward 10x 3 sideways 1032: first set with UE, 2nd & 3rd set without UE.Aaron Aas Heel lifts 15x.2  Standing hip Ext rot 10x Bil holding onto wall. Standing lunge stretch on second step for Rt hip flexor 3x 20 sec. Seated bicycle 10 min with large noodle.    12/19/2023: Nustep level 6 x7 min with PT present to discuss status Seated hamstring stretch 2x20 sec bilat Supine hooklying red loop Rt clam x8 slow and controlled - good awareness of Rt hip musculature Supine bridge with red loop hip abd x5 slow and controlled - good awareness of Rt hip musculature Supine marching with red loop around knees x10 bilat Sit to stand: no UE assist, using forward bil UE reach x10 Trigger Point Dry Needling Initial Treatment: Pt instructed on  Dry Needling rational, procedures, and possible side effects. Pt instructed to expect mild to moderate muscle soreness later in the day and/or into the next day.  Pt instructed in methods to reduce muscle soreness. Pt instructed to continue prescribed HEP. Because Dry Needling was performed over or adjacent to a lung field, pt was educated on S/S of pneumothorax and to seek immediate medical attention should they occur.  Patient was educated on signs and symptoms of infection and other risk factors and advised to seek medical attention should they occur.  Patient verbalized understanding of these instructions and education.  Patient Verbal Consent Given: Yes Education Handout Provided: Yes Muscles Treated: bilat lumbar multifidi Electrical Stimulation Performed: No Treatment Response/Outcome: Utilized skilled palpation to identify bony landmarks and trigger points.  Able to illicit twitch response and muscle elongation.  Soft tissue mobilization following to further promote tissue elongation.    12/17/23:Pt arrives for aquatic physical therapy. Treatment took place in 3.5-5.5 feet of water. Water temperature was 91 degrees F Pt entered the pool via stairs reciprocally but slow. Seated water bench with 75% submersion Pt performed seated LE AROM exercises 20x in all planes, concurrent discussion of her current status.Pt requires buoyancy of water for support and to offload joints with strengthening exercises.   75% depth water walking with UE push/pull with UE floats 10x each direction. High knee marching holding the small noodle 6x. Bil hip kicks 3 ways 20x each holding on for balance adding ankle fins today for extra drag..Step ups forward 10x 2 sideways 10x2: first set with UE, 2nd set without UE.Aaron Aas Heel lifts 15x.2  Standing hip Ext rot 10x Bil holding onto wall. Standing lunge stretch on second step for Rt hip flexor 3x 20 sec. Seated decompression float to reduce back pan and RTLE fatigue and  instability from the fatigue.    PATIENT EDUCATION:  Education details: Issued HEP Person educated: Patient Education method: Explanation, Facilities manager, and Handouts Education comprehension: verbalized understanding and returned demonstration  HOME EXERCISE PROGRAM: Access Code: [email or web address] URL: [email or web address] Date: 12/12/2023 Prepared by: Raynell Caller  Exercises - Standing March with Counter Support  - 1 x daily - 7 x weekly - 2 sets - 10 reps - Standing Hip Abduction with Counter Support  - 1 x daily - 7 x weekly - 2 sets - 10 reps - Standing Hip Extension with Counter Support  - 1 x daily - 7 x weekly - 2 sets - 10 reps - Mini Squat with Counter Support  - 1 x daily - 7 x weekly - 2 sets - 10 reps - Heel Raises with Counter Support  - 1 x daily - 7 x weekly - 2 sets - 10 reps - Side Stepping with Counter Support  - 1 x daily - 7 x weekly - 3 sets - 10 reps - Supine Hip Adduction Isometric with Ball  - 1 x daily - 7 x weekly - 1 sets - 10 reps - 5 hold - Sit to Stand  - 3 x daily - 7 x weekly - 1 sets - 10 reps - Lateral Step Up  - 1 x daily - 7 x weekly - 1 sets - 10 reps - Forward Step Up  - 1 x daily - 7 x weekly - 1 sets - 10 reps - Forward Step Down  - 1 x daily - 7 x weekly - 1 sets - 10 reps - Bridge with Hip Abduction and Resistance  - 1 x daily - 7 x weekly - 2 sets - 5 reps  ASSESSMENT:  CLINICAL IMPRESSION: Low confidence with step ups not holding on. The stability is showing improvement when doing step ups but not yet where pt feels confident.  OBJECTIVE IMPAIRMENTS: decreased balance, decreased mobility, difficulty walking, decreased strength, increased muscle spasms, impaired flexibility, and pain.   ACTIVITY LIMITATIONS: lifting, bending, standing, squatting, sleeping, and stairs  PARTICIPATION LIMITATIONS: cleaning, laundry, driving, community activity, and church  PERSONAL FACTORS: Past/current experiences and 3+ comorbidities:  osteopenia, Hx of left ankle Fx, s/p R THA on 08/25/2023 are also affecting patient's functional outcome.   REHAB POTENTIAL: Good  CLINICAL DECISION MAKING: Evolving/moderate complexity  EVALUATION COMPLEXITY: Moderate   GOALS: Goals reviewed with patient? Yes  SHORT TERM GOALS: Target date: 10/10/2023 Patient will be independent with initial HEP. Baseline: Goal status: Met  2.  Patient will participate in 3 or 6 minute walk test to establish a baseline. Baseline:  Goal status: Met  LONG TERM GOALS: Target date: 12/12/2023  Patient will be independent with advanced HEP to allow for self progression after discharge. Baseline:  Goal status: Ongoing 5/2  2.  Patient will increase right hip strength to [facility] to allow her to navigate stairs in her home with reciprocal pattern. Baseline:  Goal status: Ongoing 5/2  3.  Patient will be able to walk for at least 15 minutes with LRAD to allow for return to community ambulation. Baseline:  Goal status: ONGOING 5/2  4.  Patient will improve Lower Extremity Functional Scale to at least 50% to demonstrate improved functional mobility. Baseline: 37.5% Goal status: MET on 12/05/23 - 72.5%  5.  Patient will report ability to resume aquatic aerobics class and going on outings with her friends without increased pain. Baseline:  Goal status: ongoing, 5/2, Pt may not have this as a goal anymore bc classes are at 8:30am   PLAN:  PT FREQUENCY: 2x/week  PT DURATION: 4 weeks  PLANNED INTERVENTIONS: 97164- PT Re-evaluation, 97110-Therapeutic exercises, 97530- Therapeutic activity, 97112- Neuromuscular re-education, 97535- Self Care, 16109- Manual therapy, [id number]- Gait training,  16109- Aquatic Therapy, 97014- Electrical stimulation (unattended), [id number]- Electrical stimulation (manual), 60454- Vasopneumatic device, [id number]- Ultrasound, [id number]- Traction (mechanical), 09811- Ionotophoresis 4mg /ml Dexamethasone , Patient/Family education, Balance training,  Stair training, Taping, Dry Needling, Joint mobilization, Joint manipulation, Spinal manipulation, Spinal mobilization, Scar mobilization, Cryotherapy, and Moist heat  PLAN FOR NEXT SESSION:  Assess response to dry needling, aquatics PT. Dr. Christiane Cowing cleared Pt via message with Raynell Caller, PT for Pt to receive DN to lumbar and posterior hip as needed  Bethanne Brooks, PTA 12/24/23 10:22 AM     [facility] [address], [address] [place], [place] [address] Phone # [phone] Fax [phone]

## 2023-12-26 ENCOUNTER — Encounter: Admitting: Physical Therapy

## 2023-12-30 ENCOUNTER — Encounter: Payer: Self-pay | Admitting: Rehabilitative and Restorative Service Providers"

## 2023-12-30 ENCOUNTER — Ambulatory Visit: Admitting: Rehabilitative and Restorative Service Providers"

## 2023-12-30 DIAGNOSIS — R293 Abnormal posture: Secondary | ICD-10-CM

## 2023-12-30 DIAGNOSIS — G8929 Other chronic pain: Secondary | ICD-10-CM

## 2023-12-30 DIAGNOSIS — R262 Difficulty in walking, not elsewhere classified: Secondary | ICD-10-CM | POA: Diagnosis not present

## 2023-12-30 DIAGNOSIS — M545 Low back pain, unspecified: Secondary | ICD-10-CM

## 2023-12-30 DIAGNOSIS — M6281 Muscle weakness (generalized): Secondary | ICD-10-CM

## 2023-12-30 DIAGNOSIS — R252 Cramp and spasm: Secondary | ICD-10-CM

## 2023-12-30 DIAGNOSIS — M25551 Pain in right hip: Secondary | ICD-10-CM

## 2023-12-30 NOTE — Therapy (Signed)
 OUTPATIENT PHYSICAL THERAPY TREATMENT NOTE AND DISCHARGE SUMMARY    Patient Name: Linda [name] MRN: [id number] DOB:[birth year], 83 y.o., female Today's Date: 12/30/2023   END OF SESSION:  PT End of Session - 12/30/23 0846     Visit Number 24    Date for PT Re-Evaluation 01/12/24    Authorization Type BC/BS Medicare    Progress Note Due on Visit 28    PT Start Time [phone]    PT Stop Time 0925    PT Time Calculation (min) 42 min    Activity Tolerance Patient tolerated treatment well    Behavior During Therapy WFL for tasks assessed/performed                Past Medical History:  Diagnosis Date   ALLERGIC RHINITIS    Allergy    ANEMIA-IRON DEFICIENCY    Anxiety state, unspecified    Carpal tunnel syndrome    pt denies    Cataract    Closed left ankle fracture    COLONIC POLYPS, HX OF 2007   GERD    GLAUCOMA    Glaucoma    GOITER, MULTINODULAR    on US  2006, unchanged 6/13 with nodules all <10mm   HIATAL HERNIA WITH REFLUX    Hip dysplasia, acquired    B sx; eval at [facility] for same 01/2015 -ongoing PT   OSTEOARTHRITIS    Osteoporosis    Serrated polyp of colon    TOBACCO USE, QUIT    Past Surgical History:  Procedure Laterality Date   BREAST SURGERY  1960   Left breast cyst removed no complications   CARPAL TUNNEL RELEASE Right    COLONOSCOPY  2005   EYE SURGERY  1991-1992   both eyes   EYE SURGERY Left 2014   shunt behind left eye   ORIF FIBULA FRACTURE Left 08/24/2019   Procedure: OPEN REDUCTION INTERNAL FIXATION (ORIF) LEFT DISTAL FIBULA FRACTURE;  Surgeon: Osa Blase, MD;  Location: [facility];  Service: Orthopedics;  Laterality: Left;   Right shoulder aurgery  10/2008   TOTAL HIP ARTHROPLASTY Right 08/25/2023   Procedure: RIGHT TOTAL HIP REPLACEMENT;  Surgeon: Wes Hamman, MD;  Location: [facility] OR;  Service: Orthopedics;  Laterality: Right;  3-C   Patient Active Problem List   Diagnosis Date Noted   Status post total replacement of  right hip 08/25/2023   Degenerative joint disease of right hip 08/24/2023   Spinal stenosis of lumbar region 10/09/2022   Facet arthritis of lumbar region 10/09/2022   Chronic bilateral low back pain without sciatica 10/04/2022   Osteopenia 11/26/2021   Pre-diabetes 11/26/2021   Other fatigue 07/28/2020   Dupuytren's disease of palm 01/19/2018   Viral URI 01/23/2017   Bilateral sensorineural hearing loss 03/12/2016   Routine general medical examination at a health care facility 07/22/2015   Stress fracture of neck of right femur 01/25/2015   Anemia 11/06/2012   Anxiety 11/06/2012   Depression 11/06/2012   Cataract, nuclear 10/28/2012   Primary open angle glaucoma of both eyes, indeterminate stage 10/28/2012   Ptosis of eyelid 10/28/2012   Bladder prolapse, female, acquired 01/09/2012   GERD (gastroesophageal reflux disease) 05/21/2007    PCP: Adelia Homestead, MD  REFERRING PROVIDER: Sandie Cross, PA-C  REFERRING DIAG: [phone] (ICD-10-CM) - Status post total replacement of right hip  THERAPY DIAG:  Difficulty in walking, not elsewhere classified  Muscle weakness (generalized)  Cramp and spasm  Pain in right hip  Chronic  bilateral low back pain without sciatica  Abnormal posture  Rationale for Evaluation and Treatment: Rehabilitation  ONSET DATE: S/P Right THA on 08/25/2023  SUBJECTIVE:   SUBJECTIVE STATEMENT:  Patient states that she went back to taking Tylenol  Arthritis at night and it has helped her back a lot.   PERTINENT HISTORY: Hx of back pain, R THA on 08/25/23, OA, Hx of Left ankle fracture in 2021, osteopenia  PAIN:  PAIN:  Are you having pain? Yes NPRS scale: 2/10 Pain location:  back Pain description: intermittent and aching  Aggravating factors: sitting and then need to stand, going up the steps Relieving factors: rest   PRECAUTIONS: None  RED FLAGS: None   WEIGHT BEARING RESTRICTIONS: No  FALLS:  Has patient fallen in last 6  months? Yes. Number of falls 1 fall before surgery when she missed a step and fell at her daughter's home  LIVING ENVIRONMENT: Lives with: lives alone Lives in: House/apartment Stairs: two story home Has following equipment at home: Single point cane, Environmental consultant - 2 wheeled, shower chair, and bed side commode  OCCUPATION: Retired  PLOF: Independent and Vocation/Vocational requirements: water aerobics, church activities  PATIENT GOALS: To be independent again and return to driving and community activities.  NEXT MD VISIT: Osteoporosis Clinic with [name], PA on 09/22/23  OBJECTIVE:  Note: Objective measures were completed at Evaluation unless otherwise noted.  DIAGNOSTIC FINDINGS:  Pelvic Radiograph on 08/25/2023: IMPRESSION: Right hip arthroplasty without immediate postoperative complication.  PATIENT SURVEYS:   Eval:  LEFS 30 / 80 = 37.5 % 12/05/2023:  Lower Extremity Functional Score: 58 / 80 = 72.5 % 12/30/2023:  Lower Extremity Functional Score: 63 / 80 = 78.8 %  COGNITION: Overall cognitive status: Within functional limits for tasks assessed     SENSATION: Patient states some numbness at incision site.  MUSCLE LENGTH: Hamstrings: Right tightness noted  POSTURE: rounded shoulders and forward head  PALPATION: Tender to palpation along incision site  LOWER EXTREMITY ROM:  WFL  LOWER EXTREMITY MMT: 12/30/2023: BLE is [facility]   12/12/23: Rt: Quad 4+/5 Hip ext 4/5 Hip abd 4/5  10/31/23: Rt hip extension 4/5 Rt hip flexion 4/5 with anterior hip pain Rt hip abduction 4/5 Rt Knee 5/5   Eval:   Right hip strength grossly 4 to 4+/5 Right quad and hamstring strength of 5-/5 Left LE strength is WFL   FUNCTIONAL TESTS:  Eval: 5 times sit to stand: 21.66 with required use of UE pushing chair handles Timed up and go (TUG): 20.66 sec with RW  09/26/23:  3 minute walk: covered 32' with gait belt and no AD  10/17/2023: 3 minute walk:  464 ft with  [facility]  10/31/2023: 3 minute walk:  445 ft with SPC 5 X STS: 14.89 with light use of hands on thighs TUG: 15.90 with SPC, 15.10  12/05/2023: 3 minute walk: 632 ft without assistive device Timed up and go (TUG): 10.74 sec  12/12/2023: Stairs: reciprocal pattern with good quad and glute activation and light use of bil rail, no momentum used to step up onto Rt side  12/30/2023: 5 times sit to stand: 11.77 sec Timed up and go (TUG): 8.12 sec 3 minute walk: 704 ft    GAIT: Distance walked: >200 ft Assistive device utilized: Walker - 2 wheeled Level of assistance: Modified independence Comments: Pt with antalgic gait pattern  TODAY'S TREATMENT  12/30/2023: Nustep level 5 x5 min with PT present to discuss status LEFS, TUG, 5 times sit to stand 3 min walk test for 704 ft Seated hamstring stretch 2x20 sec bilat Negotiating stairs with unilateral rail and reciprocal pattern x3 Supine hooklying red loop Rt clam x10 slow and controlled - good awareness of Rt hip musculature Supine bridge with red loop hip abd x10 slow and controlled - good awareness of Rt hip musculature Supine marching with red loop around knees 2x10 bilat Education on HEP moving forward and addressing all goals (all met)   12/24/23:Pt arrives for aquatic physical therapy. Treatment took place in 3.5-5.5 feet of water. Water temperature was 91 degrees F Pt entered the pool via stairs reciprocally but slow. Seated water bench with 75% submersion Pt performed seated LE AROM exercises 20x in all planes, concurrent discussion of her current status.Pt requires buoyancy of water for support and to offload joints with strengthening exercises.   75% depth water walking with UE push/pull with UE floats 10x each direction. High knee marching holding the small noodle 6x. Bil hip kicks 3 ways 20x each holding on  for balance VC to push a little faster..Step ups forward 10x 3 sideways 1032: first set with UE, 2nd & 3rd set without UE.Aaron Aas Heel lifts 15x.2  Standing hip Ext rot 10x Bil holding onto wall. Standing lunge stretch on second step for Rt hip flexor 3x 20 sec. Seated bicycle 10 min with large noodle.    12/19/2023: Nustep level 6 x7 min with PT present to discuss status Seated hamstring stretch 2x20 sec bilat Supine hooklying red loop Rt clam x8 slow and controlled - good awareness of Rt hip musculature Supine bridge with red loop hip abd x5 slow and controlled - good awareness of Rt hip musculature Supine marching with red loop around knees x10 bilat Sit to stand: no UE assist, using forward bil UE reach x10 Trigger Point Dry Needling Initial Treatment: Pt instructed on Dry Needling rational, procedures, and possible side effects. Pt instructed to expect mild to moderate muscle soreness later in the day and/or into the next day.  Pt instructed in methods to reduce muscle soreness. Pt instructed to continue prescribed HEP. Because Dry Needling was performed over or adjacent to a lung field, pt was educated on S/S of pneumothorax and to seek immediate medical attention should they occur.  Patient was educated on signs and symptoms of infection and other risk factors and advised to seek medical attention should they occur.  Patient verbalized understanding of these instructions and education.  Patient Verbal Consent Given: Yes Education Handout Provided: Yes Muscles Treated: bilat lumbar multifidi Electrical Stimulation Performed: No Treatment Response/Outcome: Utilized skilled palpation to identify bony landmarks and trigger points.  Able to illicit twitch response and muscle elongation.  Soft tissue mobilization following to further promote tissue elongation.     PATIENT EDUCATION:  Education details: Issued HEP Person educated: Patient Education method: Explanation, Facilities manager, and  Handouts Education comprehension: verbalized understanding and returned demonstration  HOME EXERCISE PROGRAM: Access Code: [email or web address] URL: [email or web address] Date: 12/12/2023 Prepared by: Raynell Caller  Exercises - Standing March with Counter Support  - 1 x daily - 7 x weekly - 2 sets - 10 reps - Standing Hip Abduction with Counter Support  - 1 x daily - 7 x weekly - 2 sets - 10 reps - Standing Hip Extension with Counter Support  - 1 x daily - 7 x weekly - 2 sets -  10 reps - Mini Squat with Counter Support  - 1 x daily - 7 x weekly - 2 sets - 10 reps - Heel Raises with Counter Support  - 1 x daily - 7 x weekly - 2 sets - 10 reps - Side Stepping with Counter Support  - 1 x daily - 7 x weekly - 3 sets - 10 reps - Supine Hip Adduction Isometric with Ball  - 1 x daily - 7 x weekly - 1 sets - 10 reps - 5 hold - Sit to Stand  - 3 x daily - 7 x weekly - 1 sets - 10 reps - Lateral Step Up  - 1 x daily - 7 x weekly - 1 sets - 10 reps - Forward Step Up  - 1 x daily - 7 x weekly - 1 sets - 10 reps - Forward Step Down  - 1 x daily - 7 x weekly - 1 sets - 10 reps - Bridge with Hip Abduction and Resistance  - 1 x daily - 7 x weekly - 2 sets - 5 reps  ASSESSMENT:  CLINICAL IMPRESSION:  Ms Dicenso presents to skilled PT reporting that she is ready for discharge today.  Patient states that she was able to ambulate in the community for longer than 15 min without difficulty.  Patient states that she is looking to join a gym where she can continue water aerobics.  Educated patient about enrollment special at [employer] for the month of May, as she had interest in joining [place].  Patient has met all goals at this time for her hip, and reports that she is only ever limited due to back pain, not to hip pain any longer.  Patient with follow up visit schedule with Dr Christiane Cowing in July.  Patient discharged at this time to continue with HEP.  OBJECTIVE IMPAIRMENTS: decreased balance, decreased mobility,  difficulty walking, decreased strength, increased muscle spasms, impaired flexibility, and pain.   ACTIVITY LIMITATIONS: lifting, bending, standing, squatting, sleeping, and stairs  PARTICIPATION LIMITATIONS: cleaning, laundry, driving, community activity, and church  PERSONAL FACTORS: Past/current experiences and 3+ comorbidities: osteopenia, Hx of left ankle Fx, s/p R THA on 08/25/2023 are also affecting patient's functional outcome.   REHAB POTENTIAL: Good  CLINICAL DECISION MAKING: Evolving/moderate complexity  EVALUATION COMPLEXITY: Moderate   GOALS: Goals reviewed with patient? Yes  SHORT TERM GOALS: Target date: 10/10/2023 Patient will be independent with initial HEP. Baseline: Goal status: Met  2.  Patient will participate in 3 or 6 minute walk test to establish a baseline. Baseline:  Goal status: Met  LONG TERM GOALS: Target date: 12/12/2023  Patient will be independent with advanced HEP to allow for self progression after discharge. Baseline:  Goal status: Met on 12/30/23  2.  Patient will increase right hip strength to [facility] to allow her to navigate stairs in her home with reciprocal pattern. Baseline:  Goal status: Met on 12/30/23  3.  Patient will be able to walk for at least 15 minutes with LRAD to allow for return to community ambulation. Baseline:  Goal status: Met on 12/30/23  4.  Patient will improve Lower Extremity Functional Scale to at least 50% to demonstrate improved functional mobility. Baseline: 37.5% Goal status: MET on 12/05/23 - 72.5%  5.  Patient will report ability to resume aquatic aerobics class and going on outings with her friends without increased pain. Baseline:  Goal status: Met on 12/30/23   PLAN:  PT FREQUENCY: 2x/week  PT DURATION:  4 weeks  PLANNED INTERVENTIONS: 97164- PT Re-evaluation, 97110-Therapeutic exercises, 97530- Therapeutic activity, [id number]- Neuromuscular re-education, 97535- Self Care, 86578- Manual therapy, [id number]- Gait  training, [phone]- Aquatic Therapy, 97014- Electrical stimulation (unattended), [id number]- Electrical stimulation (manual), [id number]- Vasopneumatic device, [id number]- Ultrasound, [id number]- Traction (mechanical), [id number]- Ionotophoresis 4mg /ml Dexamethasone , Patient/Family education, Balance training, Stair training, Taping, Dry Needling, Joint mobilization, Joint manipulation, Spinal manipulation, Spinal mobilization, Scar mobilization, Cryotherapy, and Moist heat  PHYSICAL THERAPY DISCHARGE SUMMARY  See above for information on episode of care.  Patient agrees to discharge. Patient goals were met. Patient is being discharged due to meeting the stated rehab goals.    Robyne Christen, PT, DPT 12/30/23, 9:38 AM  [facility] [address] [place], [place] [address] Phone # [phone] Fax [phone]

## 2023-12-31 ENCOUNTER — Ambulatory Visit: Admitting: Physical Therapy

## 2024-01-02 ENCOUNTER — Encounter: Admitting: Physical Therapy

## 2024-01-07 ENCOUNTER — Ambulatory Visit: Admitting: Physical Therapy

## 2024-01-09 ENCOUNTER — Encounter: Admitting: Physical Therapy

## 2024-01-14 ENCOUNTER — Ambulatory Visit: Admitting: Physical Therapy

## 2024-01-16 ENCOUNTER — Encounter: Admitting: Physical Therapy

## 2024-01-21 ENCOUNTER — Ambulatory Visit: Admitting: Physical Therapy

## 2024-02-09 ENCOUNTER — Telehealth: Payer: Self-pay

## 2024-02-09 NOTE — Telephone Encounter (Signed)
 Called patient and left voice mail that her prolia is approved and her cost  will be $400.00 twice a year and if she is interested to please call me back

## 2024-02-17 ENCOUNTER — Ambulatory Visit (INDEPENDENT_AMBULATORY_CARE_PROVIDER_SITE_OTHER): Admitting: Orthopaedic Surgery

## 2024-02-17 ENCOUNTER — Other Ambulatory Visit: Payer: Self-pay | Admitting: Internal Medicine

## 2024-02-17 ENCOUNTER — Other Ambulatory Visit (INDEPENDENT_AMBULATORY_CARE_PROVIDER_SITE_OTHER): Payer: Self-pay

## 2024-02-17 DIAGNOSIS — Z96641 Presence of right artificial hip joint: Secondary | ICD-10-CM

## 2024-02-17 NOTE — Progress Notes (Signed)
 Patient: Linda [name]           Date of Birth: 1941/03/30           MRN: [id number] Visit Date: 02/17/2024 PCP: Rollene Almarie LABOR, MD   Assessment & Plan:  Chief Complaint:  Chief Complaint  Patient presents with   Right Hip - Follow-up    Right total hip arthroplasty 08/25/2023   Visit Diagnoses:  1. Status post total replacement of right hip     Plan: History of Present Illness Linda [name] is an 83 year old female who presents for a six-month follow-up after right hip replacement surgery.  She experiences a slight twitch when lifting her leg during water aerobics, without pain. There is a sensation of tightness when twisting her leg, but she can perform activities without significant discomfort. She inquires about numbness around the surgical site and whether sensation will return, expressing dislike for the numbness.  Physical Exam MUSCULOSKELETAL: Hip incision site normal, scar barely visible.  Fluid painless range of motion of the hip.  She had feels tightness in the groin when she maximally frog legs.  Results RADIOLOGY Hip X-ray: Implant appears intact and well-positioned (02/17/2024)  Assessment and Plan Post-surgical scarring with tightness Six months post-hip replacement, experiencing tightness likely due to scarring. No pain, implant and incision site in good condition. - Follow up in six months.  Follow-Up Instructions: Return in about 6 months (around 08/19/2024).   Orders:  Orders Placed This Encounter  Procedures   XR Pelvis 1-2 Views   No orders of the defined types were placed in this encounter.   Imaging: XR Pelvis 1-2 Views Result Date: 02/17/2024 Stable right total hip replacement without complications   PMFS History: Patient Active Problem List   Diagnosis Date Noted   Status post total replacement of right hip 08/25/2023   Degenerative joint disease of right hip 08/24/2023   Spinal stenosis of lumbar region 10/09/2022    Facet arthritis of lumbar region 10/09/2022   Chronic bilateral low back pain without sciatica 10/04/2022   Osteopenia 11/26/2021   Pre-diabetes 11/26/2021   Other fatigue 07/28/2020   Dupuytren's disease of palm 01/19/2018   Viral URI 01/23/2017   Bilateral sensorineural hearing loss 03/12/2016   Routine general medical examination at a health care facility 07/22/2015   Stress fracture of neck of right femur 01/25/2015   Anemia 11/06/2012   Anxiety 11/06/2012   Depression 11/06/2012   Cataract, nuclear 10/28/2012   Primary open angle glaucoma of both eyes, indeterminate stage 10/28/2012   Ptosis of eyelid 10/28/2012   Bladder prolapse, female, acquired 01/09/2012   GERD (gastroesophageal reflux disease) 05/21/2007   Past Medical History:  Diagnosis Date   ALLERGIC RHINITIS    Allergy    ANEMIA-IRON DEFICIENCY    Anxiety state, unspecified    Carpal tunnel syndrome    pt denies    Cataract    Closed left ankle fracture    COLONIC POLYPS, HX OF 2007   GERD    GLAUCOMA    Glaucoma    GOITER, MULTINODULAR    on US  2006, unchanged 6/13 with nodules all <35mm   HIATAL HERNIA WITH REFLUX    Hip dysplasia, acquired    B sx; eval at [facility] for same 01/2015 -ongoing PT   OSTEOARTHRITIS    Osteoporosis    Serrated polyp of colon    TOBACCO USE, QUIT     Family History  Problem Relation Age of Onset  Diabetes Mother    Heart disease Mother    Arthritis Mother    Hypertension Mother    Arthritis Father    Colon cancer Paternal Grandmother    Esophageal cancer Neg Hx    Rectal cancer Neg Hx    Stomach cancer Neg Hx     Past Surgical History:  Procedure Laterality Date   BREAST SURGERY  1960   Left breast cyst removed no complications   CARPAL TUNNEL RELEASE Right    COLONOSCOPY  2005   EYE SURGERY  1991-1992   both eyes   EYE SURGERY Left 2014   shunt behind left eye   ORIF FIBULA FRACTURE Left 08/24/2019   Procedure: OPEN REDUCTION INTERNAL FIXATION (ORIF) LEFT  DISTAL FIBULA FRACTURE;  Surgeon: Josefina Chew, MD;  Location: [facility];  Service: Orthopedics;  Laterality: Left;   Right shoulder aurgery  10/2008   TOTAL HIP ARTHROPLASTY Right 08/25/2023   Procedure: RIGHT TOTAL HIP REPLACEMENT;  Surgeon: Jerri Kay HERO, MD;  Location: [facility] OR;  Service: Orthopedics;  Laterality: Right;  3-C   Social History   Occupational History   Not on file  Tobacco Use   Smoking status: Former    Current packs/day: 0.00    Types: Cigarettes    Quit date: 08/12/1988    Years since quitting: 35.5    Passive exposure: Past   Smokeless tobacco: Never  Vaping Use   Vaping status: Never Used  Substance and Sexual Activity   Alcohol use: Yes    Alcohol/week: 1.0 standard drink of alcohol    Types: 1 Glasses of wine per week    Comment: occassionally   Drug use: No   Sexual activity: Not Currently    Birth control/protection: Post-menopausal

## 2024-03-17 ENCOUNTER — Telehealth: Payer: Self-pay

## 2024-03-17 NOTE — Telephone Encounter (Signed)
 Called patient and she stated that she will be speaking to her primary care doctor and see if she wants her to go forward with prolia  and she will call me back with an answer   I provided her with the cost of #334.00

## 2024-03-29 ENCOUNTER — Telehealth: Payer: Self-pay

## 2024-03-29 NOTE — Telephone Encounter (Signed)
 Pt is having her physical 8/26

## 2024-03-29 NOTE — Telephone Encounter (Signed)
 Copied from CRM #[id number]. Topic: Clinical - Lab/Test Results >> Mar 29, 2024  9:22 AM Emylou G wrote: Reason for CRM: Pls call patient to order labs/schedule

## 2024-03-30 NOTE — Telephone Encounter (Signed)
 Ok to do day of visit

## 2024-04-06 ENCOUNTER — Ambulatory Visit (INDEPENDENT_AMBULATORY_CARE_PROVIDER_SITE_OTHER): Admitting: Internal Medicine

## 2024-04-06 ENCOUNTER — Encounter: Payer: Self-pay | Admitting: Internal Medicine

## 2024-04-06 VITALS — BP 138/82 | HR 78 | Temp 98.2°F | Ht 62.0 in | Wt 137.0 lb

## 2024-04-06 DIAGNOSIS — Z0000 Encounter for general adult medical examination without abnormal findings: Secondary | ICD-10-CM

## 2024-04-06 DIAGNOSIS — D649 Anemia, unspecified: Secondary | ICD-10-CM

## 2024-04-06 DIAGNOSIS — R5383 Other fatigue: Secondary | ICD-10-CM

## 2024-04-06 DIAGNOSIS — M8589 Other specified disorders of bone density and structure, multiple sites: Secondary | ICD-10-CM | POA: Diagnosis not present

## 2024-04-06 DIAGNOSIS — F3342 Major depressive disorder, recurrent, in full remission: Secondary | ICD-10-CM

## 2024-04-06 DIAGNOSIS — F419 Anxiety disorder, unspecified: Secondary | ICD-10-CM

## 2024-04-06 DIAGNOSIS — R7303 Prediabetes: Secondary | ICD-10-CM

## 2024-04-06 MED ORDER — SERTRALINE HCL 100 MG PO TABS: 100.0000 mg | ORAL_TABLET | Freq: Every day | ORAL | 3 refills | Status: AC

## 2024-04-06 MED ORDER — BUSPIRONE HCL 5 MG PO TABS: 5.0000 mg | ORAL_TABLET | Freq: Two times a day (BID) | ORAL | 3 refills | Status: AC | PRN

## 2024-04-06 NOTE — Progress Notes (Signed)
" ° °  Subjective:   Patient ID: Linda [name], female    DOB: Dec 14, 1940, 83 y.o.   MRN: [id number]  The patient is here for physical. Pertinent topics discussed: Discussed the use of AI scribe software for clinical note transcription with the patient, who gave verbal consent to proceed.  History of Present Illness Linda [name] is an 83 year old female who presents for a comprehensive check-up and blood work.  She underwent hip replacement surgery in January and experienced significant constipation post-surgery due to anesthesia and oxycodone  use, which has since resolved.  She has scoliosis, causing back pain that worsens throughout the day. She uses a cane for support during extended walking and is not considering back surgery at this time.  She experiences Palmeri shooting pains in her head periodically, though not daily or weekly, and is concerned about these episodes, especially living alone. No new chest pain, tightness, pressure, or breathing trouble.  She has a history of urinary tract infections and is concerned about potential recurrence, citing her mother's decline due to UTIs. She has a history of prolapse and has been prescribed medication, which she stopped and then resumed taking.  She reports postnasal drip causing mucus accumulation in her throat, likely due to allergies, and occasionally experiences Vokes pains in her head.  She has granuloma annulare, primarily on her back and stomach, which is not itchy. She uses cortisone cream daily without significant relief.  She has glaucoma, which has been stable for over thirty years, and she is concerned about maintaining her vision to preserve her independence. She also reports dry eyes.  PMH, [facility], social history reviewed and updated  Review of Systems  Constitutional: Negative.   HENT: Negative.    Eyes: Negative.   Respiratory:  Negative for cough, chest tightness and shortness of breath.   Cardiovascular:  Negative for  chest pain, palpitations and leg swelling.  Gastrointestinal:  Negative for abdominal distention, abdominal pain, constipation, diarrhea, nausea and vomiting.  Musculoskeletal: Negative.   Skin: Negative.   Neurological: Negative.   Psychiatric/Behavioral: Negative.      Objective:  Physical Exam Constitutional:      Appearance: She is well-developed.  HENT:     Head: Normocephalic and atraumatic.  Cardiovascular:     Rate and Rhythm: Normal rate and regular rhythm.  Pulmonary:     Effort: Pulmonary effort is normal. No respiratory distress.     Breath sounds: Normal breath sounds. No wheezing or rales.  Abdominal:     General: Bowel sounds are normal. There is no distension.     Palpations: Abdomen is soft.     Tenderness: There is no abdominal tenderness. There is no rebound.  Musculoskeletal:     Cervical back: Normal range of motion.  Skin:    General: Skin is warm and dry.  Neurological:     Mental Status: She is alert and oriented to person, place, and time.     Coordination: Coordination normal.     Vitals:   04/06/24 1510  BP: 138/82  Pulse: 78  Temp: 98.2 F (36.8 C)  TempSrc: Oral  SpO2: 94%  Weight: 137 lb (62.1 kg)  Height: 5' 2 (1.575 m)    Assessment & Plan:   "

## 2024-04-07 NOTE — Assessment & Plan Note (Signed)
 Checking CMP and adjust as needed.

## 2024-04-07 NOTE — Assessment & Plan Note (Signed)
 Checking B12 and vitamin D  and TSH. Adjust as needed.

## 2024-04-07 NOTE — Assessment & Plan Note (Signed)
 Checking HGA1c and adjust as needed.

## 2024-04-07 NOTE — Assessment & Plan Note (Signed)
 Taking zoloft  100 mg daily and controlling her moderate recurrent depression. Continue.

## 2024-04-07 NOTE — Assessment & Plan Note (Signed)
 Using buspar  and zoloft  and well controlled. Continue.

## 2024-04-07 NOTE — Assessment & Plan Note (Signed)
 Flu shot yearly. Pneumonia complete. Shingrix complete. Tetanus due at pharmacy. Colonoscopy agd out. Mammogram aged out, pap smear aged out and dexa complete. Counseled about sun safety and mole surveillance. Counseled about the dangers of distracted driving. Given 10 year screening recommendations.

## 2024-04-07 NOTE — Assessment & Plan Note (Signed)
 Checking CBC and adjust as needed.

## 2024-04-09 ENCOUNTER — Other Ambulatory Visit

## 2024-04-09 DIAGNOSIS — D649 Anemia, unspecified: Secondary | ICD-10-CM | POA: Diagnosis not present

## 2024-04-09 DIAGNOSIS — R5383 Other fatigue: Secondary | ICD-10-CM | POA: Diagnosis not present

## 2024-04-09 DIAGNOSIS — F3342 Major depressive disorder, recurrent, in full remission: Secondary | ICD-10-CM

## 2024-04-09 DIAGNOSIS — R7303 Prediabetes: Secondary | ICD-10-CM

## 2024-04-09 LAB — URINALYSIS, ROUTINE W REFLEX MICROSCOPIC
Bilirubin Urine: NEGATIVE
Hgb urine dipstick: NEGATIVE
Ketones, ur: NEGATIVE
Nitrite: POSITIVE — AB
Specific Gravity, Urine: 1.015 (ref 1.000–1.030)
Total Protein, Urine: NEGATIVE
Urine Glucose: NEGATIVE
Urobilinogen, UA: 0.2 (ref 0.0–1.0)
pH: 7.5 (ref 5.0–8.0)

## 2024-04-09 LAB — HEMOGLOBIN A1C: Hgb A1c MFr Bld: 6.3 % (ref 4.6–6.5)

## 2024-04-09 LAB — CBC
HCT: 37.5 % (ref 36.0–46.0)
Hemoglobin: 12.5 g/dL (ref 12.0–15.0)
MCHC: 33.2 g/dL (ref 30.0–36.0)
MCV: 87.5 fl (ref 78.0–100.0)
Platelets: 227 K/uL (ref 150.0–400.0)
RBC: 4.29 Mil/uL (ref 3.87–5.11)
RDW: 15.5 % (ref 11.5–15.5)
WBC: 5.9 K/uL (ref 4.0–10.5)

## 2024-04-09 LAB — COMPREHENSIVE METABOLIC PANEL WITH GFR
ALT: 11 U/L (ref 0–35)
AST: 18 U/L (ref 0–37)
Albumin: 4.6 g/dL (ref 3.5–5.2)
Alkaline Phosphatase: 58 U/L (ref 39–117)
BUN: 16 mg/dL (ref 6–23)
CO2: 27 meq/L (ref 19–32)
Calcium: 9.2 mg/dL (ref 8.4–10.5)
Chloride: 101 meq/L (ref 96–112)
Creatinine, Ser: 0.72 mg/dL (ref 0.40–1.20)
GFR: 77.2 mL/min (ref >60.00)
Glucose, Bld: 91 mg/dL (ref 70–99)
Potassium: 4.5 meq/L (ref 3.5–5.1)
Sodium: 137 meq/L (ref 135–145)
Total Bilirubin: 0.5 mg/dL (ref 0.2–1.2)
Total Protein: 7.1 g/dL (ref 6.0–8.3)

## 2024-04-09 LAB — LIPID PANEL
Cholesterol: 201 mg/dL — ABNORMAL HIGH (ref 0–200)
HDL: 70.4 mg/dL (ref >39.00)
LDL Cholesterol: 113 mg/dL — ABNORMAL HIGH (ref 0–99)
NonHDL: 130.44
Total CHOL/HDL Ratio: 3
Triglycerides: 85 mg/dL (ref 0.0–149.0)
VLDL: 17 mg/dL (ref 0.0–40.0)

## 2024-04-09 LAB — VITAMIN D 25 HYDROXY (VIT D DEFICIENCY, FRACTURES): VITD: 42.25 ng/mL (ref 30.00–100.00)

## 2024-04-09 LAB — VITAMIN B12: Vitamin B-12: 476 pg/mL (ref 211–911)

## 2024-04-09 LAB — TSH: TSH: 2.16 u[IU]/mL (ref 0.35–5.50)

## 2024-04-13 ENCOUNTER — Ambulatory Visit: Payer: Self-pay | Admitting: Internal Medicine

## 2024-04-13 MED ORDER — NITROFURANTOIN MONOHYD MACRO 100 MG PO CAPS: 100.0000 mg | ORAL_CAPSULE | Freq: Two times a day (BID) | ORAL | 0 refills | Status: AC

## 2024-04-15 LAB — HM MAMMOGRAPHY

## 2024-04-19 ENCOUNTER — Other Ambulatory Visit: Payer: Self-pay

## 2024-04-19 ENCOUNTER — Telehealth: Payer: Self-pay

## 2024-04-19 ENCOUNTER — Encounter: Payer: Self-pay | Admitting: Internal Medicine

## 2024-04-19 MED ORDER — ESTRADIOL 0.1 MG/GM VA CREA: 1.0000 | TOPICAL_CREAM | VAGINAL | 0 refills | Status: AC

## 2024-04-19 NOTE — Telephone Encounter (Signed)
 Getting back on the estrace  cream and staying on that can help lower the risk of a uti recurrence

## 2024-04-19 NOTE — Telephone Encounter (Signed)
 Ok to refill

## 2024-04-19 NOTE — Telephone Encounter (Signed)
**Note De-identified  [place] Obfuscation** Please advise 

## 2024-04-19 NOTE — Telephone Encounter (Signed)
 This has been sent in

## 2024-04-19 NOTE — Telephone Encounter (Signed)
 Called patient and informed her to get back on this she asked if it ok to send in more refills as she is on her last one

## 2024-04-19 NOTE — Telephone Encounter (Signed)
 Copied from CRM [id number]. Topic: Clinical - Medical Advice >> Apr 19, 2024  9:14 AM Franky GRADE wrote: Reason for CRM: Patient is calling to see what she can do to prevent UTI moving forward. She just finished antibiotics sent by Dr.Crawford and is not experiencing any symptoms but would like to know how to prevent them moving forward.

## 2024-05-10 ENCOUNTER — Other Ambulatory Visit: Payer: Self-pay | Admitting: Physician Assistant

## 2024-05-10 DIAGNOSIS — M810 Age-related osteoporosis without current pathological fracture: Secondary | ICD-10-CM

## 2024-05-10 MED ORDER — DENOSUMAB 60 MG/ML ~~LOC~~ SOSY: 60.0000 mg | PREFILLED_SYRINGE | Freq: Once | SUBCUTANEOUS | Status: AC

## 2024-06-14 ENCOUNTER — Encounter: Payer: Self-pay | Admitting: Radiology

## 2024-06-16 ENCOUNTER — Other Ambulatory Visit: Payer: Self-pay | Admitting: Internal Medicine

## 2024-08-24 ENCOUNTER — Ambulatory Visit: Admitting: Orthopaedic Surgery

## 2024-08-24 ENCOUNTER — Other Ambulatory Visit (INDEPENDENT_AMBULATORY_CARE_PROVIDER_SITE_OTHER): Payer: Self-pay

## 2024-08-24 DIAGNOSIS — Z96641 Presence of right artificial hip joint: Secondary | ICD-10-CM | POA: Diagnosis not present

## 2024-08-24 NOTE — Progress Notes (Signed)
 "  Post-Op Visit Note   Patient: DENISSA COZART           Date of Birth: 1941-04-30           MRN: [id number] Visit Date: 08/24/2024 PCP: Rollene Almarie LABOR, MD   Assessment & Plan:  Chief Complaint:  Chief Complaint  Patient presents with   Right Hip - Follow-up    Right THA 08/25/2023   Visit Diagnoses:  1. Status post total replacement of right hip     Plan: History of Present Illness KIERSTYNN BABICH is an 84 year old female here for 1 year postop visit.  She is doing well overall.  She is pleased with the outcome from her right total hip arthroplasty.  Examination right hip shows fully healed surgical scar.  Fluid painless range of motion.  Normal gait pattern.  Assessment and Plan Status post right total hip arthroplasty Mild start-up pain in the right hip is common post-arthroplasty and should improve with time. Lumbar spinal stenosis may contribute to symptoms. - Reassured her that mild start-up pain is expected and should improve with activity and time. - Discussed that the left hip does not require surgical intervention currently.  Follow-Up Instructions: Return in about 1 year (around 08/24/2025).   Orders:  Orders Placed This Encounter  Procedures   XR Pelvis 1-2 Views   No orders of the defined types were placed in this encounter.   Imaging: XR Pelvis 1-2 Views Result Date: 08/24/2024 Stable total hip replacement without complications   PMFS History: Patient Active Problem List   Diagnosis Date Noted   Status post total replacement of right hip 08/25/2023   Degenerative joint disease of right hip 08/24/2023   Spinal stenosis of lumbar region 10/09/2022   Facet arthritis of lumbar region 10/09/2022   Chronic bilateral low back pain without sciatica 10/04/2022   Osteopenia 11/26/2021   Pre-diabetes 11/26/2021   Other fatigue 07/28/2020   Dupuytren's disease of palm 01/19/2018   Bilateral sensorineural hearing loss 03/12/2016   Routine  general medical examination at a health care facility 07/22/2015   Stress fracture of neck of right femur 01/25/2015   Anemia 11/06/2012   Anxiety 11/06/2012   Depression 11/06/2012   Cataract, nuclear 10/28/2012   Primary open angle glaucoma of both eyes, indeterminate stage 10/28/2012   Ptosis of eyelid 10/28/2012   Bladder prolapse, female, acquired 01/09/2012   GERD (gastroesophageal reflux disease) 05/21/2007   Past Medical History:  Diagnosis Date   ALLERGIC RHINITIS    Allergy    ANEMIA-IRON DEFICIENCY    Anxiety state, unspecified    Carpal tunnel syndrome    pt denies    Cataract    Closed left ankle fracture    COLONIC POLYPS, HX OF 2007   GERD    GLAUCOMA    Glaucoma    GOITER, MULTINODULAR    on US  2006, unchanged 6/13 with nodules all <71mm   HIATAL HERNIA WITH REFLUX    Hip dysplasia, acquired    B sx; eval at [facility] for same 01/2015 -ongoing PT   OSTEOARTHRITIS    Osteoporosis    Serrated polyp of colon    TOBACCO USE, QUIT     Family History  Problem Relation Age of Onset   Diabetes Mother    Heart disease Mother    Arthritis Mother    Hypertension Mother    Arthritis Father    Colon cancer Paternal Grandmother    Esophageal cancer Neg Hx  Rectal cancer Neg Hx    Stomach cancer Neg Hx     Past Surgical History:  Procedure Laterality Date   BREAST SURGERY  1960   Left breast cyst removed no complications   CARPAL TUNNEL RELEASE Right    COLONOSCOPY  2005   EYE SURGERY  1991-1992   both eyes   EYE SURGERY Left 2014   shunt behind left eye   ORIF FIBULA FRACTURE Left 08/24/2019   Procedure: OPEN REDUCTION INTERNAL FIXATION (ORIF) LEFT DISTAL FIBULA FRACTURE;  Surgeon: Josefina Chew, MD;  Location: [facility];  Service: Orthopedics;  Laterality: Left;   Right shoulder aurgery  10/2008   TOTAL HIP ARTHROPLASTY Right 08/25/2023   Procedure: RIGHT TOTAL HIP REPLACEMENT;  Surgeon: Jerri Kay HERO, MD;  Location: [facility] OR;  Service:  Orthopedics;  Laterality: Right;  3-C   Social History   Occupational History   Not on file  Tobacco Use   Smoking status: Former    Current packs/day: 0.00    Types: Cigarettes    Quit date: 08/12/1988    Years since quitting: 36.0    Passive exposure: Past   Smokeless tobacco: Never  Vaping Use   Vaping status: Never Used  Substance and Sexual Activity   Alcohol use: Yes    Alcohol/week: 1.0 standard drink of alcohol    Types: 1 Glasses of wine per week    Comment: occassionally   Drug use: No   Sexual activity: Not Currently    Birth control/protection: Post-menopausal     "

## 2024-11-04 ENCOUNTER — Ambulatory Visit

## 2024-11-04 ENCOUNTER — Encounter: Admitting: Internal Medicine

## 2025-08-30 ENCOUNTER — Ambulatory Visit: Admitting: Orthopaedic Surgery
